# Patient Record
Sex: Male | Born: 1965 | Race: White | Hispanic: No | Marital: Married | Smoking: Never smoker
Health system: Southern US, Community
[De-identification: ages and names within clinical notes are randomized; demographics above are authoritative.]

## PROBLEM LIST (undated history)

## (undated) DIAGNOSIS — E119 Type 2 diabetes mellitus without complications: Secondary | ICD-10-CM

---

## 2000-04-26 ENCOUNTER — Emergency Department (HOSPITAL_COMMUNITY): Admission: EM | Admit: 2000-04-26 | Discharge: 2000-04-26 | Payer: Self-pay | Admitting: Emergency Medicine

## 2000-04-26 ENCOUNTER — Encounter: Payer: Self-pay | Admitting: Emergency Medicine

## 2016-09-09 DIAGNOSIS — J019 Acute sinusitis, unspecified: Secondary | ICD-10-CM | POA: Diagnosis not present

## 2016-09-09 DIAGNOSIS — M60077 Infective myositis, left toe(s): Secondary | ICD-10-CM | POA: Diagnosis not present

## 2016-10-08 ENCOUNTER — Inpatient Hospital Stay (HOSPITAL_BASED_OUTPATIENT_CLINIC_OR_DEPARTMENT_OTHER)
Admission: EM | Admit: 2016-10-08 | Discharge: 2016-10-10 | DRG: 624 | Disposition: A | Discharge status: Home / Self Care | Payer: 59 | Attending: Internal Medicine | Admitting: Internal Medicine

## 2016-10-08 ENCOUNTER — Encounter (HOSPITAL_BASED_OUTPATIENT_CLINIC_OR_DEPARTMENT_OTHER): Payer: Self-pay | Admitting: *Deleted

## 2016-10-08 ENCOUNTER — Emergency Department (HOSPITAL_BASED_OUTPATIENT_CLINIC_OR_DEPARTMENT_OTHER): Payer: 59

## 2016-10-08 ENCOUNTER — Encounter: Payer: Self-pay | Admitting: Family Medicine

## 2016-10-08 ENCOUNTER — Ambulatory Visit (INDEPENDENT_AMBULATORY_CARE_PROVIDER_SITE_OTHER): Payer: 59 | Admitting: Family Medicine

## 2016-10-08 VITALS — BP 164/88 | HR 106 | Temp 98.4°F | Resp 16 | Ht 71.0 in | Wt 224.0 lb

## 2016-10-08 DIAGNOSIS — L97519 Non-pressure chronic ulcer of other part of right foot with unspecified severity: Secondary | ICD-10-CM

## 2016-10-08 DIAGNOSIS — R030 Elevated blood-pressure reading, without diagnosis of hypertension: Secondary | ICD-10-CM | POA: Diagnosis present

## 2016-10-08 DIAGNOSIS — E1165 Type 2 diabetes mellitus with hyperglycemia: Secondary | ICD-10-CM | POA: Diagnosis not present

## 2016-10-08 DIAGNOSIS — S91121A Laceration with foreign body of right great toe without damage to nail, initial encounter: Secondary | ICD-10-CM

## 2016-10-08 DIAGNOSIS — Z6833 Body mass index (BMI) 33.0-33.9, adult: Secondary | ICD-10-CM

## 2016-10-08 DIAGNOSIS — L089 Local infection of the skin and subcutaneous tissue, unspecified: Secondary | ICD-10-CM

## 2016-10-08 DIAGNOSIS — L03031 Cellulitis of right toe: Secondary | ICD-10-CM | POA: Diagnosis not present

## 2016-10-08 DIAGNOSIS — E11621 Type 2 diabetes mellitus with foot ulcer: Principal | ICD-10-CM

## 2016-10-08 DIAGNOSIS — R739 Hyperglycemia, unspecified: Secondary | ICD-10-CM | POA: Diagnosis present

## 2016-10-08 DIAGNOSIS — E669 Obesity, unspecified: Secondary | ICD-10-CM | POA: Diagnosis present

## 2016-10-08 DIAGNOSIS — Z823 Family history of stroke: Secondary | ICD-10-CM

## 2016-10-08 DIAGNOSIS — T148XXA Other injury of unspecified body region, initial encounter: Secondary | ICD-10-CM

## 2016-10-08 DIAGNOSIS — S91132A Puncture wound without foreign body of left great toe without damage to nail, initial encounter: Secondary | ICD-10-CM | POA: Diagnosis not present

## 2016-10-08 DIAGNOSIS — E1140 Type 2 diabetes mellitus with diabetic neuropathy, unspecified: Secondary | ICD-10-CM | POA: Diagnosis present

## 2016-10-08 DIAGNOSIS — E66811 Obesity, class 1: Secondary | ICD-10-CM

## 2016-10-08 DIAGNOSIS — Z6831 Body mass index (BMI) 31.0-31.9, adult: Secondary | ICD-10-CM

## 2016-10-08 DIAGNOSIS — E876 Hypokalemia: Secondary | ICD-10-CM

## 2016-10-08 DIAGNOSIS — L97509 Non-pressure chronic ulcer of other part of unspecified foot with unspecified severity: Secondary | ICD-10-CM

## 2016-10-08 DIAGNOSIS — Z833 Family history of diabetes mellitus: Secondary | ICD-10-CM

## 2016-10-08 DIAGNOSIS — E6609 Other obesity due to excess calories: Secondary | ICD-10-CM

## 2016-10-08 HISTORY — DX: Type 2 diabetes mellitus without complications: E11.9

## 2016-10-08 LAB — BASIC METABOLIC PANEL
Anion gap: 11 (ref 5–15)
BUN: 9 mg/dL (ref 6–20)
CALCIUM: 9 mg/dL (ref 8.9–10.3)
CO2: 27 mmol/L (ref 22–32)
CREATININE: 0.63 mg/dL (ref 0.61–1.24)
Chloride: 97 mmol/L — ABNORMAL LOW (ref 101–111)
GFR calc Af Amer: >60 mL/min (ref >60)
Glucose, Bld: 342 mg/dL — ABNORMAL HIGH (ref 65–99)
POTASSIUM: 3.6 mmol/L (ref 3.5–5.1)
SODIUM: 135 mmol/L (ref 135–145)

## 2016-10-08 LAB — CBC WITH DIFFERENTIAL/PLATELET
Basophils Absolute: 0 10*3/uL (ref 0.0–0.1)
Basophils Relative: 0 %
EOS ABS: 0.1 10*3/uL (ref 0.0–0.7)
EOS PCT: 1 %
HCT: 40.2 % (ref 39.0–52.0)
Hemoglobin: 14.3 g/dL (ref 13.0–17.0)
LYMPHS ABS: 1.7 10*3/uL (ref 0.7–4.0)
LYMPHS PCT: 15 %
MCH: 29 pg (ref 26.0–34.0)
MCHC: 35.6 g/dL (ref 30.0–36.0)
MCV: 81.5 fL (ref 78.0–100.0)
MONO ABS: 0.8 10*3/uL (ref 0.1–1.0)
MONOS PCT: 7 %
Neutro Abs: 8.6 10*3/uL — ABNORMAL HIGH (ref 1.7–7.7)
Neutrophils Relative %: 77 %
PLATELETS: 249 10*3/uL (ref 150–400)
RBC: 4.93 MIL/uL (ref 4.22–5.81)
RDW: 12.7 % (ref 11.5–15.5)
WBC: 11.3 10*3/uL — AB (ref 4.0–10.5)

## 2016-10-08 MED ORDER — TETANUS-DIPHTH-ACELL PERTUSSIS 5-2.5-18.5 LF-MCG/0.5 IM SUSP
0.5000 mL | Freq: Once | INTRAMUSCULAR | Status: AC
  Administered 2016-10-08: 0.5 mL via INTRAMUSCULAR
  Filled 2016-10-08: qty 0.5

## 2016-10-08 MED ORDER — SODIUM CHLORIDE 0.9 % IV BOLUS (SEPSIS)
1000.0000 mL | Freq: Once | INTRAVENOUS | Status: AC
  Administered 2016-10-08: 1000 mL via INTRAVENOUS

## 2016-10-08 MED ORDER — CEFAZOLIN SODIUM-DEXTROSE 1-4 GM/50ML-% IV SOLN
1.0000 g | Freq: Once | INTRAVENOUS | Status: AC
  Administered 2016-10-08: 1 g via INTRAVENOUS
  Filled 2016-10-08: qty 50

## 2016-10-08 MED ORDER — SODIUM CHLORIDE 0.9 % IV SOLN
Freq: Once | INTRAVENOUS | Status: AC
  Administered 2016-10-09: 02:00:00 via INTRAVENOUS

## 2016-10-08 NOTE — ED Provider Notes (Signed)
[facility]Y [facility] Provider Note   CSN: [id number] Arrival date & time: 10/08/16  1449     History   Chief Complaint Chief Complaint  Patient presents with  . Foot Ulcer    HPI Scott Rog. is a 51 y.o. male.  HPI   Scott Navarro. is a 51 y.o. male, no diagnosed past medical history, presenting to the ED with wound to the right big toe. States he stepped on a piece of broken plastic about 2 months ago. He has been placing bandages and neosporin. This past week, the area began to get red and swollen. Denies any pain to the area. He denies fever/chills, numbness, tingling, weakness, subsequent injuries, or any other complaints.   History reviewed. No pertinent past medical history.  There are no active problems to display for this patient.   History reviewed. No pertinent surgical history.     Home Medications    Prior to Admission medications   Not on File    Family History History reviewed. No pertinent family history.  Social History Social History  Substance Use Topics  . Smoking status: Never Smoker  . Smokeless tobacco: Never Used  . Alcohol use No     Allergies   Patient has no known allergies.   Review of Systems Review of Systems  Constitutional: Negative for chills and fever.  Respiratory: Negative for shortness of breath.   Cardiovascular: Negative for chest pain.  Gastrointestinal: Negative for nausea and vomiting.  Musculoskeletal: Positive for joint swelling. Negative for arthralgias.  Skin: Positive for wound.  Neurological: Negative for dizziness, syncope, weakness, light-headedness, numbness and headaches.  All other systems reviewed and are negative.    Physical Exam Updated Vital Signs BP (!) 170/96   Pulse 99   Temp 98.5 F (36.9 C) (Oral)   Wt 101.6 kg (224 lb)   SpO2 100%   BMI 31.24 kg/m   Physical Exam  Constitutional: He appears well-developed and well-nourished. No distress.  HENT:  Head:  Normocephalic and atraumatic.  Eyes: Conjunctivae are normal.  Neck: Neck supple.  Cardiovascular: Normal rate, regular rhythm, normal heart sounds and intact distal pulses.   Pulmonary/Chest: Effort normal and breath sounds normal. No respiratory distress.  Abdominal: Soft. There is no tenderness. There is no guarding.  Musculoskeletal: He exhibits edema. He exhibits no tenderness.  Lymphadenopathy:    He has no cervical adenopathy.  Neurological: He is alert.  Patient endorses full sensation in the peripheral right lower extremity. Flexion and extension at the right great toe and ankle 5/5.  Skin: Skin is warm and dry. Capillary refill takes less than 2 seconds. He is not diaphoretic.  Large amount of swelling to the right great toe extending into the region of the distal first metatarsal. Overlying erythema to the same. Ulcer with purulent-appearing drainage to the medial plantar side of the right great toe. Secondary ulcer noted to the medial side of the right great toe distal to the MTP joint.  Psychiatric: He has a normal mood and affect. His behavior is normal.  Nursing note and vitals reviewed.                   ED Treatments / Results  Labs (all labs ordered are listed, but only abnormal results are displayed) Labs Reviewed  CBC WITH DIFFERENTIAL/PLATELET - Abnormal; Notable for the following:       Result Value   WBC 11.3 (*)    Neutro Abs 8.6 (*)  All other components within normal limits  BASIC METABOLIC PANEL - Abnormal; Notable for the following:    Chloride 97 (*)    Glucose, Bld 342 (*)    All other components within normal limits    EKG  EKG Interpretation None       Radiology Dg Toe Great Right  Result Date: 10/08/2016 CLINICAL DATA:  Infection. EXAM: RIGHT GREAT TOE COMPARISON:  None. FINDINGS: An 8 mm curvilinear density is noted superficially within the medial aspect of the right great toe. There is extensive soft tissue swelling with a  puncture wound along the plantar surface of the great toe medially. No acute or focal osseous abnormality is present. Degenerative changes are present at the first MTP joint. IMPRESSION: 1. Extensive soft tissue swelling and puncture wound along the plantar and medial aspect of the right great toe. 2. An 8 mm curvilinear density compatible with the residual foreign body. 3. No acute or focal osseous abnormality associated with the soft tissue swelling. Electronically Signed   By: Marin Robertshristopher  Mattern M.D.   On: 10/08/2016 15:24    Procedures Procedures (including critical care time)  Medications Ordered in ED Medications  sodium chloride 0.9 % bolus 1,000 mL (1,000 mLs Intravenous New Bag/Given 10/08/16 1902)    Followed by  0.9 %  sodium chloride infusion (not administered)  Tdap (BOOSTRIX) injection 0.5 mL (0.5 mLs Intramuscular Given 10/08/16 1735)  ceFAZolin (ANCEF) IVPB 1 g/50 mL premix (0 g Intravenous Stopped 10/08/16 1812)     Initial Impression / Assessment and Plan / ED Course  I have reviewed the triage vital signs and the nursing notes.  Pertinent labs & imaging results that were available during my care of the patient were reviewed by me and considered in my medical decision making (see chart for details).  Clinical Course as of Oct 08 2000  Tue Oct 08, 2016  1749 Spoke with Dr. Aundria Rudogers, orthopedic surgeon, who agrees with admission. Requests transfer to [facility] and admission via Medicine for IV antibiotic management. He agrees with Ancef for IV choice since the injury occurred when the patient was inside his home and he was barefoot at the time.  [SJ]  [id number] Spoke with Dr. Adela Glimpseoutova, hospitalist.  We discussed the patient's request to go POV to his admission destination. There are currently no admission beds available at [facility] and they're currently backed up with admissions. She requests that I speak to Dr. Aundria Rudogers to see if [facility] might be an option, as there are MedSurg  beds available there.  [SJ]  [id number] Spoke with Dr. Aundria Rudogers. States that he will definitely need to go to [facility] because Dr. Lajoyce Cornersuda will be in the OR at [facility] tomorrow and he will be the one to evaluate the patient and take him to the OR if needed.  [SJ]  1916 Spoke with Dr. Effie Shy[place]entz, [place] [facility], who states he will be expecting the patient.   [SJ]  1935 Spoke with Dr. Adela Glimpseoutova again to update her that the patient will be going to the ED at [facility] to await admission. She requests that once the patient arrives, whichever hospitalist is up next to take an admission be contacted and they can come admit the patient. Patient will also need to have an repeat IV placed.   [SJ]  1953 Spoke with Dr. Effie ShyWentz again. Updated him on hospitalist's preferences and to let him know patient will be on his way shortly.   [  SJ]    Clinical Course User Index [SJ] Pooja Camuso C, PA-C    Patient presents with wound infection to the right great toe. Admission for IV antibiotics and orthopedic evaluation. He is nontoxic appearing. He does not appear to be septic. Discussed admission and transfer with patient. Patient agrees to the treatment plan, except for transfer by ambulance. 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 He is adamant that he be allowed to secure this vehicle personally, but will then proceed to the admission destination POV.  I suspect the patient has undiagnosed hypertension and diabetes and the diabetes may have contributed to today's presentation. Hyperglycemia is noted, however, without anion gap and thus I doubt DKA.  Patient's IV was removed prior to transfer. Patient will be proceeding to the [facility] ED POV. He has strict instructions to pull over and call 911 should any change in his status occur.  Findings and plan of care discussed with Alvira Monday, MD.   Vitals:   10/08/16 1455 10/08/16 1456 10/08/16 1758  BP:  (!) 170/96 (!) 164/97    Pulse:  99 (!) 105  Resp:   18  Temp:  98.5 F (36.9 C)   TempSrc:  Oral   SpO2:  100% 96%  Weight: 101.6 kg (224 lb)     Patient's hypertension is noted. He has no symptoms of hypertensive urgency or emergency.    Final Clinical Impressions(s) / ED Diagnoses   Final diagnoses:  Wound infection  Laceration w FB of right great toe w/o damage to nail, init  Hyperglycemia    New Prescriptions New Prescriptions   No medications on file     Concepcion Living 10/08/16 2002    Detra Bores C, PA-C 10/08/16 Eyvonne Left, MD 10/12/16 2152

## 2016-10-08 NOTE — ED Provider Notes (Signed)
Patient sent from [facility] for evaluation of right great toe/foot pain and swelling. He reports stepping on a piece of plastic that punctured his foot approximately 2 months ago. Since then the healing process has waxed and waned. It has significantly worsened over the past week. He was seen by his primary doctor, then sent to the emergency department for IV antibiotics. [facility], the care was discussed with the orthopedic surgeon who wanted the patient admitted at [facility] so the patient could be evaluated and possibly undergo surgical debridement. As there are no beds in the hospital, he he was transferred to the ED. He has no further complaints.  Will discuss with the hospitalist.   Geoffery Lyonselo, Edwardine Deschepper, MD 10/09/16 0000

## 2016-10-08 NOTE — Discharge Instructions (Signed)
Please proceed directly to the [facility] Emergency Department. They should be expecting you.  If you have any issues prior to getting to the destination ED, please pull over and call 911.

## 2016-10-08 NOTE — Progress Notes (Signed)
Subjective:  By signing my name below, I, Stann Oresung-Kai Tsai, attest that this documentation has been prepared under the direction and in the presence of Meredith StaggersJeffrey Lawan Nanez, MD. Electronically Signed: Stann Oresung-Kai Tsai, Scribe. 10/08/2016 , 1:54 PM .  Patient was seen in Room 10 .   Patient ID: Scott HoppingWoodrow A Ruotolo Jr., male    DOB: [birth year], 51 y.o.   MRN: [id number] Chief Complaint  Patient presents with  . Toe Injury    stepped on plastic 2 months ago now increased redness and swelling to right great toe   HPI Scott HoppingWoodrow A Marte Jr. is a 51 y.o. male  Patient states he stepped on a small piece that was broken off of the base of a lamp when he walked around his house in the dark roughly 2 months ago. He notes initial puncture is located at bottom of right great toe and it's stayed irritated since. He informs the area bled right away. He has initially treated the area with neosporin and it improved back to normal toe color, except the puncture was still open. In the evenings, he's been soaking the area with peroxide and water.   However, he noticed some swelling with color change a week ago. He's had pus draining intermittently from the wound, depending how much he's been standing on his feet. He also states a crack appearing this morning with blood; he felt a pop when leaning over this morning to put shoes on. He denies knowledge of diabetes. He denies any chronic medical issues. He denies history of broken bone. He denies fever, chills, or hot & cold spells.   He works in [employer].   There are no active problems to display for this patient.  No past medical history on file. No past surgical history on file. Allergies not on file Prior to Admission medications   Not on File   Social History   Social History  . Marital status: Married    Spouse name: N/A  . Number of children: N/A  . Years of education: N/A   Occupational History  . Not on file.   Social History Main  Topics  . Smoking status: Not on file  . Smokeless tobacco: Not on file  . Alcohol use Not on file  . Drug use: Unknown  . Sexual activity: Not on file   Other Topics Concern  . Not on file   Social History Narrative  . No narrative on file   Review of Systems  Constitutional: Negative for chills, fatigue, fever and unexpected weight change.  Eyes: Negative for visual disturbance.  Respiratory: Negative for cough, chest tightness and shortness of breath.   Cardiovascular: Negative for chest pain, palpitations and leg swelling.  Gastrointestinal: Negative for abdominal pain, blood in stool, diarrhea, nausea and vomiting.  Musculoskeletal: Positive for joint swelling and myalgias.  Skin: Positive for color change and wound.  Neurological: Negative for dizziness, light-headedness and headaches.       Objective:   Physical Exam  Constitutional: He is oriented to person, place, and time. He appears well-developed and well-nourished. No distress.  HENT:  Head: Normocephalic and atraumatic.  Eyes: Pupils are equal, round, and reactive to light. EOM are normal.  Neck: Neck supple.  Cardiovascular: Normal rate.   Pulmonary/Chest: Effort normal. No respiratory distress.  Musculoskeletal: Normal range of motion.  Right foot: markedly swollen right great toe with an ulcer at the base of the toe that appears to be at least 5-276mm deep,  there is another opening laterally, unable to visualize base; there is some clear discharge with surrounding macerated skin; medial aspect of his great toe, there is another wound draining similar clear fluid and macerated skin; there's a small split dorsally on the toe; erythema and swelling extends to the distal first MTP; erythema extends to base of great toe which is approximately 2-3 times the size of left great toe; primary area of tenderness is medial base of his toe and area of open wound Left foot: has callous at the MTP and small ulceration between  first and second MTP's without surrounding edema or erythema  Neurological: He is alert and oriented to person, place, and time.  Skin: Skin is warm and dry.  Psychiatric: He has a normal mood and affect. His behavior is normal.  Nursing note and vitals reviewed.   Vitals:   10/08/16 1328  BP: (!) 158/98  Pulse: (!) 106  Resp: 16  Temp: 98.4 F (36.9 C)  TempSrc: Oral  SpO2: 97%  Weight: 224 lb (101.6 kg)  Height: 5\' 11"  (1.803 m)      Assessment & Plan:   Scott Dirosa. is a 51 y.o. male Toe infection  Ulcer of right foot, unspecified ulcer stage (HCC)  Cellulitis of great toe of right foot   Prior puncture wound with open wound for 2 months, now with increased swelling, plantar ulcer and medial ulceration/wound. Concerning for deep space infection, and based on timing could have osteomyelitis.   -recommended ER eval as likley will need imaging to determine degree of wound/infection and eval bone. Ortho/wound care may also need to be consulted.   -patient chose [facility] ER, but advised of possible need for admission and may then need transport to hospital.   -XR, bloodwork deferred as likely will have performed in ER.   No orders of the defined types were placed in this encounter.  Patient Instructions   Unfortunately based on the appearance of your toe today, I am concerned about a deep space infection or possible infection extending to the bone. I will call [facility] emergency room to let them know we are sending you their way. They should be able to do other imaging and evaluation of that wound at that time. As they are likely to do blood work and x-rays, I did not order those at our office. Depending on the plan at in the ER, I am happy to see you in follow-up if needed.    IF you received an x-ray today, you will receive an invoice from [facility]. Please contact [facility] at [phone] with questions or concerns  regarding your invoice.   IF you received labwork today, you will receive an invoice from [place]. Please contact [facility] at [phone] with questions or concerns regarding your invoice.   Our billing staff will not be able to assist you with questions regarding bills from these companies.  You will be contacted with the lab results as soon as they are available. The fastest way to get your results is to activate your My Chart account. Instructions are located on the last page of this paperwork. If you have not heard from [place] regarding the results in 2 weeks, please contact this office.       I personally performed the services described in this documentation, which was scribed in my presence. The recorded information has been reviewed and considered for accuracy and completeness, addended by me as needed, and  agree with information above.  Signed,   Meredith Staggers, MD Primary Care at [facility].  10/10/16 8:28 AM

## 2016-10-08 NOTE — Progress Notes (Signed)
Ortho Note  I have discussed this case with the ED staff at [facility].  We appreciate medicine service admission for initiation of iv abx.  Dr. Lajoyce Cornersuda will evaluate the patient in the AM and determine if there is any indication for surgical management.  Please make NPO at [facility] tonight.   Yolonda KidaJason Patrick Najee Manninen, MD Orthopedic Surgery

## 2016-10-08 NOTE — Patient Instructions (Addendum)
Unfortunately based on the appearance of your toe today, I am concerned about a deep space infection or possible infection extending to the bone. I will call [facility] emergency room to let them know we are sending you their way. They should be able to do other imaging and evaluation of that wound at that time. As they are likely to do blood work and x-rays, I did not order those at our office. Depending on the plan at in the ER, I am happy to see you in follow-up if needed.    IF you received an x-ray today, you will receive an invoice from [facility]. Please contact [facility] at [phone] with questions or concerns regarding your invoice.   IF you received labwork today, you will receive an invoice from [facility]. Please contact [facility] at [phone] with questions or concerns regarding your invoice.   Our billing staff will not be able to assist you with questions regarding bills from these companies.  You will be contacted with the lab results as soon as they are available. The fastest way to get your results is to activate your My Chart account. Instructions are located on the last page of this paperwork. If you have not heard from us regarding the results in 2 weeks, please contact this office.

## 2016-10-08 NOTE — ED Notes (Signed)
Line to affected site done, dated and timed.

## 2016-10-08 NOTE — ED Notes (Signed)
Report given to Brittney in the ED and she stated that there is a 5 hour wait.   Patient is leaving POV.

## 2016-10-08 NOTE — ED Notes (Signed)
Dr. Delo at the bedside 

## 2016-10-08 NOTE — ED Triage Notes (Signed)
Pt sent here from [facility] for eval foot wound x 1 month ago after stepping on plastic.

## 2016-10-09 ENCOUNTER — Inpatient Hospital Stay (HOSPITAL_COMMUNITY): Payer: 59 | Admitting: Anesthesiology

## 2016-10-09 ENCOUNTER — Encounter (HOSPITAL_COMMUNITY): Payer: Self-pay | Admitting: Internal Medicine

## 2016-10-09 ENCOUNTER — Inpatient Hospital Stay (HOSPITAL_COMMUNITY)
Admit: 2016-10-09 | Discharge: 2016-10-09 | Disposition: A | Discharge status: Home / Self Care | Payer: 59 | Attending: Internal Medicine | Admitting: Internal Medicine

## 2016-10-09 ENCOUNTER — Encounter (HOSPITAL_COMMUNITY)
Admission: EM | Disposition: A | Discharge status: Home / Self Care | Payer: Self-pay | Source: Home / Self Care | Attending: Internal Medicine

## 2016-10-09 DIAGNOSIS — Z823 Family history of stroke: Secondary | ICD-10-CM | POA: Diagnosis not present

## 2016-10-09 DIAGNOSIS — E876 Hypokalemia: Secondary | ICD-10-CM | POA: Diagnosis not present

## 2016-10-09 DIAGNOSIS — T148XXA Other injury of unspecified body region, initial encounter: Secondary | ICD-10-CM

## 2016-10-09 DIAGNOSIS — L97519 Non-pressure chronic ulcer of other part of right foot with unspecified severity: Secondary | ICD-10-CM | POA: Diagnosis present

## 2016-10-09 DIAGNOSIS — R030 Elevated blood-pressure reading, without diagnosis of hypertension: Secondary | ICD-10-CM | POA: Diagnosis not present

## 2016-10-09 DIAGNOSIS — E1142 Type 2 diabetes mellitus with diabetic polyneuropathy: Secondary | ICD-10-CM

## 2016-10-09 DIAGNOSIS — E1165 Type 2 diabetes mellitus with hyperglycemia: Secondary | ICD-10-CM | POA: Diagnosis present

## 2016-10-09 DIAGNOSIS — R739 Hyperglycemia, unspecified: Secondary | ICD-10-CM

## 2016-10-09 DIAGNOSIS — Z6831 Body mass index (BMI) 31.0-31.9, adult: Secondary | ICD-10-CM | POA: Diagnosis not present

## 2016-10-09 DIAGNOSIS — L97509 Non-pressure chronic ulcer of other part of unspecified foot with unspecified severity: Secondary | ICD-10-CM | POA: Diagnosis not present

## 2016-10-09 DIAGNOSIS — S91121A Laceration with foreign body of right great toe without damage to nail, initial encounter: Secondary | ICD-10-CM | POA: Diagnosis not present

## 2016-10-09 DIAGNOSIS — Z833 Family history of diabetes mellitus: Secondary | ICD-10-CM | POA: Diagnosis not present

## 2016-10-09 DIAGNOSIS — E6609 Other obesity due to excess calories: Secondary | ICD-10-CM | POA: Diagnosis not present

## 2016-10-09 DIAGNOSIS — M79609 Pain in unspecified limb: Secondary | ICD-10-CM

## 2016-10-09 DIAGNOSIS — E11621 Type 2 diabetes mellitus with foot ulcer: Secondary | ICD-10-CM | POA: Diagnosis not present

## 2016-10-09 DIAGNOSIS — L03031 Cellulitis of right toe: Secondary | ICD-10-CM | POA: Diagnosis not present

## 2016-10-09 DIAGNOSIS — L089 Local infection of the skin and subcutaneous tissue, unspecified: Secondary | ICD-10-CM | POA: Diagnosis present

## 2016-10-09 DIAGNOSIS — Z6833 Body mass index (BMI) 33.0-33.9, adult: Secondary | ICD-10-CM | POA: Diagnosis not present

## 2016-10-09 DIAGNOSIS — E669 Obesity, unspecified: Secondary | ICD-10-CM | POA: Diagnosis present

## 2016-10-09 DIAGNOSIS — E1140 Type 2 diabetes mellitus with diabetic neuropathy, unspecified: Secondary | ICD-10-CM | POA: Diagnosis present

## 2016-10-09 HISTORY — PX: AMPUTATION: SHX166

## 2016-10-09 LAB — CBG MONITORING, ED
GLUCOSE-CAPILLARY: 374 mg/dL — AB (ref 65–99)
Glucose-Capillary: 310 mg/dL — ABNORMAL HIGH (ref 65–99)
Glucose-Capillary: 219 mg/dL — ABNORMAL HIGH (ref 65–99)

## 2016-10-09 LAB — CBC
HCT: 36.6 % — ABNORMAL LOW (ref 39.0–52.0)
HEMOGLOBIN: 12.5 g/dL — AB (ref 13.0–17.0)
MCH: 28.3 pg (ref 26.0–34.0)
MCHC: 34.2 g/dL (ref 30.0–36.0)
MCV: 82.8 fL (ref 78.0–100.0)
PLATELETS: 212 10*3/uL (ref 150–400)
RBC: 4.42 MIL/uL (ref 4.22–5.81)
RDW: 12.7 % (ref 11.5–15.5)
WBC: 8 10*3/uL (ref 4.0–10.5)

## 2016-10-09 LAB — PREALBUMIN: PREALBUMIN: 17.8 mg/dL — AB (ref 18–38)

## 2016-10-09 LAB — PHOSPHORUS: Phosphorus: 3.3 mg/dL (ref 2.5–4.6)

## 2016-10-09 LAB — COMPREHENSIVE METABOLIC PANEL
ALK PHOS: 61 U/L (ref 38–126)
ALT: 16 U/L — ABNORMAL LOW (ref 17–63)
ANION GAP: 9 (ref 5–15)
AST: 14 U/L — ABNORMAL LOW (ref 15–41)
Albumin: 3.1 g/dL — ABNORMAL LOW (ref 3.5–5.0)
BILIRUBIN TOTAL: 0.4 mg/dL (ref 0.3–1.2)
BUN: 9 mg/dL (ref 6–20)
CALCIUM: 8.7 mg/dL — AB (ref 8.9–10.3)
CO2: 26 mmol/L (ref 22–32)
CREATININE: 0.58 mg/dL — AB (ref 0.61–1.24)
Chloride: 102 mmol/L (ref 101–111)
GFR calc non Af Amer: >60 mL/min (ref >60)
Glucose, Bld: 267 mg/dL — ABNORMAL HIGH (ref 65–99)
Potassium: 3.3 mmol/L — ABNORMAL LOW (ref 3.5–5.1)
Sodium: 137 mmol/L (ref 135–145)
TOTAL PROTEIN: 6.6 g/dL (ref 6.5–8.1)

## 2016-10-09 LAB — HIV ANTIBODY (ROUTINE TESTING W REFLEX): HIV Screen 4th Generation wRfx: NONREACTIVE

## 2016-10-09 LAB — URINALYSIS, ROUTINE W REFLEX MICROSCOPIC
Bacteria, UA: NONE SEEN
Bilirubin Urine: NEGATIVE
HGB URINE DIPSTICK: NEGATIVE
Ketones, ur: 20 mg/dL — AB
Leukocytes, UA: NEGATIVE
Nitrite: NEGATIVE
PH: 5 (ref 5.0–8.0)
Protein, ur: NEGATIVE mg/dL
RBC / HPF: NONE SEEN RBC/hpf (ref 0–5)
SPECIFIC GRAVITY, URINE: 1.036 — AB (ref 1.005–1.030)
Squamous Epithelial / LPF: NONE SEEN

## 2016-10-09 LAB — TSH: TSH: 1.924 u[IU]/mL (ref 0.350–4.500)

## 2016-10-09 LAB — GLUCOSE, CAPILLARY
GLUCOSE-CAPILLARY: 217 mg/dL — AB (ref 65–99)
GLUCOSE-CAPILLARY: 233 mg/dL — AB (ref 65–99)
GLUCOSE-CAPILLARY: 267 mg/dL — AB (ref 65–99)

## 2016-10-09 LAB — MAGNESIUM: MAGNESIUM: 2.1 mg/dL (ref 1.7–2.4)

## 2016-10-09 LAB — SEDIMENTATION RATE: SED RATE: 92 mm/h — AB (ref 0–16)

## 2016-10-09 LAB — C-REACTIVE PROTEIN: CRP: 2.7 mg/dL — AB (ref <1.0)

## 2016-10-09 LAB — HEMOGLOBIN A1C
Hgb A1c MFr Bld: 12.9 % — ABNORMAL HIGH (ref 4.8–5.6)
Mean Plasma Glucose: 323.53 mg/dL

## 2016-10-09 SURGERY — AMPUTATION, FOOT, RAY
Anesthesia: General | Site: Toe | Laterality: Right

## 2016-10-09 MED ORDER — ACETAMINOPHEN 325 MG PO TABS: 650.0000 mg | ORAL_TABLET | Freq: Four times a day (QID) | ORAL | Status: DC | PRN

## 2016-10-09 MED ORDER — MEPERIDINE HCL 25 MG/ML IJ SOLN: 6.2500 mg | INTRAMUSCULAR | Status: DC | PRN

## 2016-10-09 MED ORDER — METRONIDAZOLE IN NACL 5-0.79 MG/ML-% IV SOLN
500.0000 mg | Freq: Three times a day (TID) | INTRAVENOUS | Status: DC
  Administered 2016-10-09 – 2016-10-10 (×3): 500 mg via INTRAVENOUS
  Filled 2016-10-09 (×5): qty 100

## 2016-10-09 MED ORDER — BISACODYL 10 MG RE SUPP: 10.0000 mg | Freq: Every day | RECTAL | Status: DC | PRN

## 2016-10-09 MED ORDER — ONDANSETRON HCL 4 MG/2ML IJ SOLN: 4.0000 mg | Freq: Once | INTRAMUSCULAR | Status: DC | PRN

## 2016-10-09 MED ORDER — METHOCARBAMOL 1000 MG/10ML IJ SOLN: 500.0000 mg | Freq: Four times a day (QID) | INTRAVENOUS | Status: DC | PRN

## 2016-10-09 MED ORDER — SODIUM CHLORIDE 0.9 % IV SOLN: INTRAVENOUS | Status: DC

## 2016-10-09 MED ORDER — OXYCODONE HCL 5 MG PO TABS
5.0000 mg | ORAL_TABLET | ORAL | Status: DC | PRN
  Administered 2016-10-09: 10 mg via ORAL
  Filled 2016-10-09: qty 2

## 2016-10-09 MED ORDER — POVIDONE-IODINE 10 % EX SWAB: 2.0000 "application " | Freq: Once | CUTANEOUS | Status: DC

## 2016-10-09 MED ORDER — HYDROCODONE-ACETAMINOPHEN 5-325 MG PO TABS
1.0000 | ORAL_TABLET | ORAL | Status: DC | PRN
  Administered 2016-10-09: 1 via ORAL

## 2016-10-09 MED ORDER — ONDANSETRON HCL 4 MG PO TABS: 4.0000 mg | ORAL_TABLET | Freq: Four times a day (QID) | ORAL | Status: DC | PRN

## 2016-10-09 MED ORDER — LACTATED RINGERS IV SOLN
INTRAVENOUS | Status: DC | PRN
  Administered 2016-10-09: 11:00:00 via INTRAVENOUS

## 2016-10-09 MED ORDER — DEXTROSE 5 % IV SOLN
2.0000 g | INTRAVENOUS | Status: DC
  Administered 2016-10-10: 2 g via INTRAVENOUS
  Filled 2016-10-09 (×2): qty 2

## 2016-10-09 MED ORDER — MAGNESIUM CITRATE PO SOLN: 1.0000 | Freq: Once | ORAL | Status: DC | PRN

## 2016-10-09 MED ORDER — METRONIDAZOLE IN NACL 5-0.79 MG/ML-% IV SOLN
INTRAVENOUS | Status: AC
  Filled 2016-10-09: qty 100

## 2016-10-09 MED ORDER — METOCLOPRAMIDE HCL 5 MG/ML IJ SOLN: 5.0000 mg | Freq: Three times a day (TID) | INTRAMUSCULAR | Status: DC | PRN

## 2016-10-09 MED ORDER — LACTATED RINGERS IV SOLN
INTRAVENOUS | Status: DC
  Administered 2016-10-09: 12:00:00 via INTRAVENOUS

## 2016-10-09 MED ORDER — LIDOCAINE HCL (CARDIAC) 20 MG/ML IV SOLN
INTRAVENOUS | Status: DC | PRN
  Administered 2016-10-09: 80 mg via INTRAVENOUS

## 2016-10-09 MED ORDER — ONDANSETRON HCL 4 MG/2ML IJ SOLN: 4.0000 mg | Freq: Four times a day (QID) | INTRAMUSCULAR | Status: DC | PRN

## 2016-10-09 MED ORDER — CEFAZOLIN SODIUM-DEXTROSE 2-4 GM/100ML-% IV SOLN
2.0000 g | INTRAVENOUS | Status: AC
  Administered 2016-10-09: 2 g via INTRAVENOUS
  Filled 2016-10-09: qty 100

## 2016-10-09 MED ORDER — DOCUSATE SODIUM 100 MG PO CAPS
100.0000 mg | ORAL_CAPSULE | Freq: Two times a day (BID) | ORAL | Status: DC
  Administered 2016-10-09 – 2016-10-10 (×2): 100 mg via ORAL
  Filled 2016-10-09 (×2): qty 1

## 2016-10-09 MED ORDER — ACETAMINOPHEN 650 MG RE SUPP: 650.0000 mg | Freq: Four times a day (QID) | RECTAL | Status: DC | PRN

## 2016-10-09 MED ORDER — HYDROCODONE-ACETAMINOPHEN 5-325 MG PO TABS
ORAL_TABLET | ORAL | Status: AC
  Filled 2016-10-09: qty 1

## 2016-10-09 MED ORDER — ONDANSETRON HCL 4 MG/2ML IJ SOLN
INTRAMUSCULAR | Status: DC | PRN
  Administered 2016-10-09: 4 mg via INTRAVENOUS

## 2016-10-09 MED ORDER — INSULIN ASPART 100 UNIT/ML ~~LOC~~ SOLN
0.0000 [IU] | SUBCUTANEOUS | Status: DC
  Administered 2016-10-09: 5 [IU] via SUBCUTANEOUS
  Administered 2016-10-09: 9 [IU] via SUBCUTANEOUS
  Administered 2016-10-09: 3 [IU] via SUBCUTANEOUS
  Administered 2016-10-10 (×2): 2 [IU] via SUBCUTANEOUS
  Administered 2016-10-10 (×2): 3 [IU] via SUBCUTANEOUS
  Filled 2016-10-09 (×2): qty 1

## 2016-10-09 MED ORDER — METOCLOPRAMIDE HCL 5 MG PO TABS: 5.0000 mg | ORAL_TABLET | Freq: Three times a day (TID) | ORAL | Status: DC | PRN

## 2016-10-09 MED ORDER — HYDROMORPHONE HCL 1 MG/ML IJ SOLN: 1.0000 mg | INTRAMUSCULAR | Status: DC | PRN

## 2016-10-09 MED ORDER — DEXTROSE 5 % IV SOLN
2.0000 g | INTRAVENOUS | Status: DC
  Administered 2016-10-09: 2 g via INTRAVENOUS
  Filled 2016-10-09: qty 2

## 2016-10-09 MED ORDER — 0.9 % SODIUM CHLORIDE (POUR BTL) OPTIME
TOPICAL | Status: DC | PRN
  Administered 2016-10-09: 1000 mL

## 2016-10-09 MED ORDER — PROPOFOL 10 MG/ML IV BOLUS
INTRAVENOUS | Status: DC | PRN
  Administered 2016-10-09: 50 mg via INTRAVENOUS
  Administered 2016-10-09: 150 mg via INTRAVENOUS

## 2016-10-09 MED ORDER — MIDAZOLAM HCL 5 MG/5ML IJ SOLN
INTRAMUSCULAR | Status: DC | PRN
  Administered 2016-10-09: 2 mg via INTRAVENOUS

## 2016-10-09 MED ORDER — FENTANYL CITRATE (PF) 100 MCG/2ML IJ SOLN
INTRAMUSCULAR | Status: DC | PRN
  Administered 2016-10-09: 50 ug via INTRAVENOUS

## 2016-10-09 MED ORDER — DEXTROSE 5 % IV SOLN
INTRAVENOUS | Status: AC
  Filled 2016-10-09: qty 2

## 2016-10-09 MED ORDER — MIDAZOLAM HCL 2 MG/2ML IJ SOLN
INTRAMUSCULAR | Status: AC
  Filled 2016-10-09: qty 2

## 2016-10-09 MED ORDER — HYDROMORPHONE HCL 1 MG/ML IJ SOLN: 0.2500 mg | INTRAMUSCULAR | Status: DC | PRN

## 2016-10-09 MED ORDER — METRONIDAZOLE IN NACL 5-0.79 MG/ML-% IV SOLN
500.0000 mg | Freq: Three times a day (TID) | INTRAVENOUS | Status: DC
  Administered 2016-10-09 (×2): 500 mg via INTRAVENOUS
  Filled 2016-10-09 (×2): qty 100

## 2016-10-09 MED ORDER — METHOCARBAMOL 500 MG PO TABS: 500.0000 mg | ORAL_TABLET | Freq: Four times a day (QID) | ORAL | Status: DC | PRN

## 2016-10-09 MED ORDER — PROPOFOL 10 MG/ML IV BOLUS
INTRAVENOUS | Status: AC
  Filled 2016-10-09: qty 20

## 2016-10-09 MED ORDER — POLYETHYLENE GLYCOL 3350 17 G PO PACK: 17.0000 g | PACK | Freq: Every day | ORAL | Status: DC | PRN

## 2016-10-09 MED ORDER — CHLORHEXIDINE GLUCONATE 4 % EX LIQD
60.0000 mL | Freq: Once | CUTANEOUS | Status: DC
  Filled 2016-10-09: qty 60

## 2016-10-09 MED ORDER — SODIUM CHLORIDE 0.9 % IV SOLN: INTRAVENOUS | Status: AC

## 2016-10-09 SURGICAL SUPPLY — 27 items
BLADE SAW SGTL MED 73X18.5 STR (BLADE) IMPLANT
BLADE SURG 21 STRL SS (BLADE) ×2 IMPLANT
BNDG COHESIVE 4X5 TAN STRL (GAUZE/BANDAGES/DRESSINGS) ×2 IMPLANT
BNDG GAUZE ELAST 4 BULKY (GAUZE/BANDAGES/DRESSINGS) ×2 IMPLANT
COVER SURGICAL LIGHT HANDLE (MISCELLANEOUS) ×4 IMPLANT
DRAPE U-SHAPE 47X51 STRL (DRAPES) ×4 IMPLANT
DRSG ADAPTIC 3X8 NADH LF (GAUZE/BANDAGES/DRESSINGS) ×2 IMPLANT
DRSG PAD ABDOMINAL 8X10 ST (GAUZE/BANDAGES/DRESSINGS) ×4 IMPLANT
DURAPREP 26ML APPLICATOR (WOUND CARE) ×2 IMPLANT
ELECT REM PT RETURN 9FT ADLT (ELECTROSURGICAL) ×2
ELECTRODE REM PT RTRN 9FT ADLT (ELECTROSURGICAL) ×1 IMPLANT
GAUZE SPONGE 4X4 12PLY STRL (GAUZE/BANDAGES/DRESSINGS) ×2 IMPLANT
GLOVE BIOGEL PI IND STRL 9 (GLOVE) ×1 IMPLANT
GLOVE BIOGEL PI INDICATOR 9 (GLOVE) ×1
GLOVE SURG ORTHO 9.0 STRL STRW (GLOVE) ×2 IMPLANT
GOWN STRL REUS W/ TWL XL LVL3 (GOWN DISPOSABLE) ×2 IMPLANT
GOWN STRL REUS W/TWL XL LVL3 (GOWN DISPOSABLE) ×4
KIT BASIN OR (CUSTOM PROCEDURE TRAY) ×2 IMPLANT
KIT ROOM TURNOVER OR (KITS) ×2 IMPLANT
NS IRRIG 1000ML POUR BTL (IV SOLUTION) ×2 IMPLANT
PACK ORTHO EXTREMITY (CUSTOM PROCEDURE TRAY) ×2 IMPLANT
PAD ARMBOARD 7.5X6 YLW CONV (MISCELLANEOUS) ×4 IMPLANT
STOCKINETTE IMPERVIOUS LG (DRAPES) IMPLANT
SUT ETHILON 2 0 PSLX (SUTURE) ×2 IMPLANT
TOWEL OR 17X26 10 PK STRL BLUE (TOWEL DISPOSABLE) ×2 IMPLANT
TUBE CONNECTING 12X1/4 (SUCTIONS) ×2 IMPLANT
YANKAUER SUCT BULB TIP NO VENT (SUCTIONS) ×2 IMPLANT

## 2016-10-09 NOTE — ED Notes (Signed)
Pt back from vascular and being transported to OR

## 2016-10-09 NOTE — Progress Notes (Signed)
Inpatient Diabetes Program Recommendations  AACE/ADA: New Consensus Statement on Inpatient Glycemic Control (2015)  Target Ranges:  Prepandial:   less than 140 mg/dL      Peak postprandial:   less than 180 mg/dL (1-2 hours)      Critically ill patients:  140 - 180 mg/dL   Results for Nicolaou, Lowella DellWOODROW A JR. (MRN [id number]) as of 10/09/2016 09:30  Ref. Range 10/09/2016 01:46 10/09/2016 03:06 10/09/2016 07:34  Glucose-Capillary Latest Ref Range: 65 - 99 mg/dL 409374 (H) Novolog 9 units given 310 (H) 219 (H) Novolog 3 units given   Review of Glycemic Control  Diabetes history: New diagnosis Current orders for Inpatient glycemic control: Novolog Sensitve Correction 0-9 units Q4 hours  Inpatient Diabetes Program Recommendations:    A1c 12.9% on 10/09/16. Patient meets criteria for new Diabetes diagnosis. Will see patient this admission. Will order education and educational materials after he gets into an inpatient room.  Consider Lantus 10 units (0.1 units/kg) while inpatient.  Thanks,  Christena DeemShannon Larra Crunkleton RN, MSN, [facility] Inpatient Diabetes Coordinator Team Pager [phone] (8a-5p)

## 2016-10-09 NOTE — Anesthesia Postprocedure Evaluation (Signed)
Anesthesia Post Note  Patient: Scott HoppingWoodrow A Siller Jr.  Procedure(s) Performed: Procedure(s) (LRB): INCISION AND DRAINAGE RIGHT GREAT TOE (Right)     Patient location during evaluation: PACU Anesthesia Type: General Level of consciousness: awake and alert Pain management: pain level controlled Vital Signs Assessment: post-procedure vital signs reviewed and stable Respiratory status: spontaneous breathing, nonlabored ventilation, respiratory function stable and patient connected to nasal cannula oxygen Cardiovascular status: blood pressure returned to baseline and stable Postop Assessment: no signs of nausea or vomiting Anesthetic complications: no    Last Vitals:  Vitals:   10/09/16 1530 10/09/16 1630  BP: (!) 145/76 (!) 148/73  Pulse: 89 91  Resp:    Temp:      Last Pain:  Vitals:   10/09/16 1630  TempSrc:   PainSc: 0-No pain                 Jannae Fagerstrom DAVID

## 2016-10-09 NOTE — Op Note (Signed)
10/08/2016 - 10/09/2016  1:19 PM  PATIENT:  Scott HoppingWoodrow A Dexter Jr.    PRE-OPERATIVE DIAGNOSIS:  INFECTED RIGHT GREAT TOE  POST-OPERATIVE DIAGNOSIS:  Same  PROCEDURE:  INCISION AND DRAINAGE RIGHT GREAT TOE  Deep tissue samples sent for cultures. Local tissue rearrangement for wound closure 1 x 5 cm.  SURGEON:  Nadara MustardMarcus V Willmer Fellers, MD  PHYSICIAN ASSISTANT:None ANESTHESIA:   General  PREOPERATIVE INDICATIONS:  Scott HoppingWoodrow A Fant Jr. is a  51 y.o. male with a diagnosis of INFECTED RIGHT GREAT TOE who failed conservative measures and elected for surgical management.    The risks benefits and alternatives were discussed with the patient preoperatively including but not limited to the risks of infection, bleeding, nerve injury, cardiopulmonary complications, the need for revision surgery, among others, and the patient was willing to proceed.  OPERATIVE IMPLANTS: None  OPERATIVE FINDINGS: Abscess infection extended down to the proximal phalanx of the right great toe  OPERATIVE PROCEDURE: Patient was brought to the operating room and underwent a general anesthetic. After adequate levels anesthesia obtained patient's right lower extremity was prepped using DuraPrep draped into a sterile field a timeout was called. Elliptical incision was made that extended along the medial border of the great toe at the MTP joint extended distally and curved to incorporate the ulcerative tissue and the ulcerative tissue was resected in 1 block of tissue approximately 1 x 5 cm. This extended down to bone. There is no destructive bony changes there was only a small drop of purulent drainage. The wound was irrigated with normal saline there was good petechial bleeding. Local tissue rearrangement was used to loosely close the 1 x 5 cm defect. A sterile compressive dressing was applied patient was extubated taken the PACU in stable condition.  Infection extended down to bone patient most likely will require IV antibiotics at  discharge.

## 2016-10-09 NOTE — Consult Note (Signed)
ORTHOPAEDIC CONSULTATION  REQUESTING PHYSICIAN: Vassie Loll, MD  Chief Complaint: Cellulitis pain and swelling right great toe.  HPI: Scott Navarro. is a 51 y.o. male who presents with cellulitis ulceration retained foreign body right great toe. Patient states that he stepped on a piece of plastic 1-2 months ago. Patient states that he is progressively had increased pain and drainage with cellulitis and swelling.  Patient states that his father had diabetes but he has not been diagnosed with diabetes. Patient's sugars are running in the 300s during admission.  Patient does not smoke or drink. He is not taking any medication.  History reviewed. No pertinent past medical history. History reviewed. No pertinent surgical history. Social History   Social History  . Marital status: Married    Spouse name: N/A  . Number of children: N/A  . Years of education: N/A   Social History Main Topics  . Smoking status: Never Smoker  . Smokeless tobacco: Never Used  . Alcohol use No  . Drug use: No  . Sexual activity: Not Asked   Other Topics Concern  . None   Social History Narrative  . None   Family History  Problem Relation Age of Onset  . Diabetes Father   . Lung cancer Other   . Stroke Other   . CAD Neg Hx   . Hypertension Neg Hx    - negative except otherwise stated in the family history section No Known Allergies Prior to Admission medications   Not on File   Dg Toe Great Right  Result Date: 10/08/2016 CLINICAL DATA:  Infection. EXAM: RIGHT GREAT TOE COMPARISON:  None. FINDINGS: An 8 mm curvilinear density is noted superficially within the medial aspect of the right great toe. There is extensive soft tissue swelling with a puncture wound along the plantar surface of the great toe medially. No acute or focal osseous abnormality is present. Degenerative changes are present at the first MTP joint. IMPRESSION: 1. Extensive soft tissue swelling and puncture wound along  the plantar and medial aspect of the right great toe. 2. An 8 mm curvilinear density compatible with the residual foreign body. 3. No acute or focal osseous abnormality associated with the soft tissue swelling. Electronically Signed   By: Marin Roberts M.D.   On: 10/08/2016 15:24   - pertinent xrays, CT, MRI studies were reviewed and independently interpreted  Positive ROS: All other systems have been reviewed and were otherwise negative with the exception of those mentioned in the HPI and as above.  Physical Exam: General: Alert, no acute distress Psychiatric: Patient is competent for consent with normal mood and affect Lymphatic: No axillary or cervical lymphadenopathy Cardiovascular: No pedal edema Respiratory: No cyanosis, no use of accessory musculature GI: No organomegaly, abdomen is soft and non-tender  Skin: Examination patient has sausage digit swelling of the right great toe. There is an ulcer plantarly there is blistering and drainage consistent with abscess and most likely osteomyelitis.   Neurologic: Patient does not have protective sensation bilateral lower extremities.   MUSCULOSKELETAL:  Examination patient has a good dorsalis pedis pulse and there is cellulitis up to the MTP joint there is no red streaking. Review of the radiographs shows swelling there is no destructive bony changes possible retained plastic foreign body.  Assessment: Assessment: Diabetic insensate neuropathy with cellulitis abscess ulceration right great toe with possible retained foreign body possible osteomyelitis.  Plan: Plan: We will plan for surgical intervention this morning with debridement and  attempted salvage of the toe. Discussed the risk of toe amputation.  Thank you for the consult and the opportunity to see [name]  [name], MD [facility] [phone] 7:21 AM

## 2016-10-09 NOTE — Progress Notes (Signed)
Inpatient Diabetes Program Recommendations  AACE/ADA: New Consensus Statement on Inpatient Glycemic Control (2015)  Target Ranges:  Prepandial:   less than 140 mg/dL      Peak postprandial:   less than 180 mg/dL (1-2 hours)      Critically ill patients:  140 - 180 mg/dL   Lab Results  Component Value Date   GLUCAP 217 (H) 10/09/2016   HGBA1C 12.9 (H) 10/09/2016   Attempted to catch patient before procedure and speak to him about A1c level and new DM diagnosis. Patient just left for procedure. Will see patient tomorrow 8/9.  Thanks,  Christena DeemShannon Davina Howlett RN, MSN, [facility] Inpatient Diabetes Coordinator Team Pager [phone] (8a-5p)

## 2016-10-09 NOTE — H&P (Signed)
Scott Navarro. ZOX:[id number] DOB: Jun 03, 1965 DOA: 10/08/2016     PCP: Dr Chilton Si  Outpatient Specialists: none Patient coming from:   home Lives  With family    Chief Complaint: Right toe infection  HPI: Scott Navarro. is a 51 y.o. male with no significant medical history       Presented with pain in his right great toe she stepped on plastic 2 months ago while walking barefoot in his apartment started to have swelling of his right great toe. He attempted to use Neosporin but continued to have open puncture has been trying to wash (etc. water did not seem to improve. He started to have pus draining from the wound intermittently. He had had any fevers or chills he presented to [facility] urgent care today and was sent to [facility] emergency department Denies weight loss, frequent urination, no fatigue, no chest pain.  His blood pressure have been slightly elevated he have had some stress recently after his father passed away.   Regarding pertinent Chronic problems: Patient denies history of diabetes or hypertension.   IN ER:  Temp (24hrs), Avg:98.5 F (36.9 C), Min:98.4 F (36.9 C), Max:98.5 F (36.9 C)      on arrival  ED Triage Vitals  Enc Vitals Group     BP 10/08/16 1456 (!) 170/96     Pulse Rate 10/08/16 1456 99     Resp 10/08/16 1758 18     Temp 10/08/16 1456 98.5 F (36.9 C)     Temp Source 10/08/16 1456 Oral     SpO2 10/08/16 1456 100 %     Weight 10/08/16 1455 224 lb (101.6 kg)     Height --      Head Circumference --      Peak Flow --      Pain Score 10/08/16 1454 5     Pain Loc --      Pain Edu? --      Excl. in GC? --    97% HR 105 BP 152/93 WBC 11.3 Hg 14.3 PLt 249 Na 135 K 3.6 Cr 0.63 Glucose 342 Plain imaging showing residual foreign body no osteo  Following Medications were ordered in ER: Medications  sodium chloride 0.9 % bolus 1,000 mL (0 mLs Intravenous Stopped 10/08/16 2025)    Followed by  0.9 %  sodium chloride infusion (not  administered)  Tdap (BOOSTRIX) injection 0.5 mL (0.5 mLs Intramuscular Given 10/08/16 1735)  ceFAZolin (ANCEF) IVPB 1 g/50 mL premix (0 g Intravenous Stopped 10/08/16 1812)     ER provider discussed case with:  Dr. Aundria Rud orthopedics who recommends start on Ancef and admission for IV antibiotics admitted to [facility] for possible or debridement by Dr. Lajoyce Corners tomorrow morning  Hospitalist was called for admission for great toe wound infection  Review of Systems:    Pertinent positives include:toe pain and swelling  Constitutional:  No weight loss, night sweats, Fevers, chills, fatigue, weight loss  HEENT:  No headaches, Difficulty swallowing,Tooth/dental problems,Sore throat,  No sneezing, itching, ear ache, nasal congestion, post nasal drip,  Cardio-vascular:  No chest pain, Orthopnea, PND, anasarca, dizziness, palpitations.no Bilateral lower extremity swelling  GI:  No heartburn, indigestion, abdominal pain, nausea, vomiting, diarrhea, change in bowel habits, loss of appetite, melena, blood in stool, hematemesis Resp:  no shortness of breath at rest. No dyspnea on exertion, No excess mucus, no productive cough, No non-productive cough, No coughing up of blood.No change in color of mucus.No  wheezing. Skin:  no rash or lesions. No jaundice GU:  no dysuria, change in color of urine, no urgency or frequency. No straining to urinate.  No flank pain.  Musculoskeletal:  No joint pain or no joint swelling. No decreased range of motion. No back pain.  Psych:  No change in mood or affect. No depression or anxiety. No memory loss.  Neuro: no localizing neurological complaints, no tingling, no weakness, no double vision, no gait abnormality, no slurred speech, no confusion  As per HPI otherwise 10 point review of systems negative.   Past Medical History: History reviewed. No pertinent past medical history. History reviewed. No pertinent surgical history.   Social History:  Ambulatory  independently     reports that he has never smoked. He has never used smokeless tobacco. He reports that he does not drink alcohol or use drugs.  Allergies:  No Known Allergies     Family History:   Family History  Problem Relation Age of Onset  . Diabetes Father   . Lung cancer Other   . Stroke Other   . CAD Neg Hx   . Hypertension Neg Hx     Medications: Prior to Admission medications   Not on File    Physical Exam: Patient Vitals for the past 24 hrs:  BP Temp Temp src Pulse Resp SpO2 Weight  10/09/16 0015 (!) 152/93 - - (!) 105 - 97 % -  10/08/16 2002 (!) 154/88 - - (!) 107 18 99 % -  10/08/16 1758 (!) 164/97 - - (!) 105 18 96 % -  10/08/16 1456 (!) 170/96 98.5 F (36.9 C) Oral 99 - 100 % -  10/08/16 1455 - - - - - - 101.6 kg (224 lb)    1. General:  in No Acute distress 2. Psychological: Alert and   Oriented 3. Head/ENT:    Dry Mucous Membranes                          Head Non traumatic, neck supple                          Normal   Dentition 4. SKIN:  decreased Skin turgor,  Skin clean Dry and intact no rash Ulceration of right great toe 5. Heart: Regular rate and rhythm no  Murmur, Rub or gallop 6. Lungs: Clear to auscultation bilaterally, no wheezes or crackles   7. Abdomen: Soft,  non-tender, Non distended 8. Lower extremities: no clubbing, cyanosis, or edema 9. Neurologically Grossly intact, moving all 4 extremities equally   10. MSK: Normal range of motion   body mass index is 31.24 kg/m.  Labs on Admission:   Labs on Admission: I have personally reviewed following labs and imaging studies  CBC:  Recent Labs Lab 10/08/16 1725  WBC 11.3*  NEUTROABS 8.6*  HGB 14.3  HCT 40.2  MCV 81.5  PLT 249   Basic Metabolic Panel:  Recent Labs Lab 10/08/16 1725  NA 135  K 3.6  CL 97*  CO2 27  GLUCOSE 342*  BUN 9  CREATININE 0.63  CALCIUM 9.0   GFR: Estimated Creatinine Clearance: 132.6 mL/min (by C-G formula based on SCr of 0.63  mg/dL). Liver Function Tests: No results for input(s): AST, ALT, ALKPHOS, BILITOT, PROT, ALBUMIN in the last 168 hours. No results for input(s): LIPASE, AMYLASE in the last 168 hours. No results for input(s): AMMONIA in the  last 168 hours. Coagulation Profile: No results for input(s): INR, PROTIME in the last 168 hours. Cardiac Enzymes: No results for input(s): CKTOTAL, CKMB, CKMBINDEX, TROPONINI in the last 168 hours. BNP (last 3 results) No results for input(s): PROBNP in the last 8760 hours. HbA1C: No results for input(s): HGBA1C in the last 72 hours. CBG: No results for input(s): GLUCAP in the last 168 hours. Lipid Profile: No results for input(s): CHOL, HDL, LDLCALC, TRIG, CHOLHDL, LDLDIRECT in the last 72 hours. Thyroid Function Tests: No results for input(s): TSH, T4TOTAL, FREET4, T3FREE, THYROIDAB in the last 72 hours. Anemia Panel: No results for input(s): VITAMINB12, FOLATE, FERRITIN, TIBC, IRON, RETICCTPCT in the last 72 hours. Urine analysis: No results found for: COLORURINE, APPEARANCEUR, LABSPEC, PHURINE, GLUCOSEU, HGBUR, BILIRUBINUR, KETONESUR, PROTEINUR, UROBILINOGEN, NITRITE, LEUKOCYTESUR Sepsis Labs: @LABRCNTIP (procalcitonin:4,lacticidven:4) )No results found for this or any previous visit (from the past 240 hour(s)).     UA   ordered  No results found for: HGBA1C  Estimated Creatinine Clearance: 132.6 mL/min (by C-G formula based on SCr of 0.63 mg/dL).  BNP (last 3 results) No results for input(s): PROBNP in the last 8760 hours.   ECG REPORT  Independently reviewed ordered  Filed Weights   10/08/16 1455  Weight: 101.6 kg (224 lb)     Cultures: No results found for: SDES, SPECREQUEST, CULT, REPTSTATUS   Radiological Exams on Admission: Dg Toe Great Right  Result Date: 10/08/2016 CLINICAL DATA:  Infection. EXAM: RIGHT GREAT TOE COMPARISON:  None. FINDINGS: An 8 mm curvilinear density is noted superficially within the medial aspect of the right  great toe. There is extensive soft tissue swelling with a puncture wound along the plantar surface of the great toe medially. No acute or focal osseous abnormality is present. Degenerative changes are present at the first MTP joint. IMPRESSION: 1. Extensive soft tissue swelling and puncture wound along the plantar and medial aspect of the right great toe. 2. An 8 mm curvilinear density compatible with the residual foreign body. 3. No acute or focal osseous abnormality associated with the soft tissue swelling. Electronically Signed   By: Marin Robertshristopher  Mattern M.D.   On: 10/08/2016 15:24    Chart has been reviewed  Assessment/Plan   51 y.o. male with no significant medical history significant   Admitted for great toe wound infection  Present on Admission: . Wound infection - Dr. Lajoyce CornersUDA see patient in the morning. For now continue IV antibiotics ceftriaxone and IV metronidazole, keep nothing by mouth post midnight order ABI, MRSA screen plain films showing no osteomyelitis but for a body noted appreciated orthopedics consult  . Hyperglycemia - obtain TSH hemoglobin A1c monitor blood sugars and cover sliding scale patient probably has undiagnosed diabetes order nutritional consult and diabetes care coordinator elevated blood pressure - if continues to be elevated and evidence of proteinuria would initiate ACE inhibitor  Other plan as per orders.  DVT prophylaxis:  SCD    Code Status:  FULL CODE  as per patient  Family Communication:   Family not at  Bedside   Disposition Plan:     To home once workup is complete and patient is stable                                          Diabetes coordinator  Nutrition  consulted  Consults called : Orthopedics  Admission status:    inpatient       Level of care          medical floor           I have spent a total of 56 min on this admission   Ajeet Casasola 10/09/2016, 1:12 AM    [facility]  Pager [phone]    after 2 AM please page floor coverage PA If 7AM-7PM, please contact the day team taking care of the patient  [email or web address]  Password [email or web address]

## 2016-10-09 NOTE — Progress Notes (Signed)
Patient seen and examined. Admitted after midnight secondary to right great toe ulcer and infection. Started on IV antibiotics and with plans for surgical debridement by orthopedic surgery. Patient is hemodynamically stable. Please refer to H&P written by Dr. Adela Glimpseoutova for further info/details on admission.  Scott Navarro, [place] Jordahl [phone]

## 2016-10-09 NOTE — Clinical Social Work Note (Signed)
Clinical Social Work Assessment  Patient Details  Name: Scott HoppingWoodrow A Sloniker Jr. MRN: [id number] Date of Birth: Aug 24, 1965  Date of referral:  10/09/16               Reason for consult:  Medication Concerns                Permission sought to share information with:  Family Supports Permission granted to share information::  Yes, Verbal Permission Granted  Name::        Agency::     Relationship::     Contact Information:     Housing/Transportation Living arrangements for the past 2 months:  Single Family Home (with wife and adult son. ) Source of Information:  Patient Patient Interpreter Needed:  None Criminal Activity/Legal Involvement Pertinent to Current Situation/Hospitalization:  No - Comment as needed Significant Relationships:  Spouse, Adult Children Lives with:    Do you feel safe going back to the place where you live?  Yes Need for family participation in patient care:  Yes (Comment)  Care giving concerns:  CSW spoke with pt at bedside. During this time pt was very pleasant and answerer questions that were asked. 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 Pt is looking forward to getting better and getting back to work as soon as possible.    Social Worker assessment / plan:  CSW spoke with pt at bedside. Pt informed CSW that pt was going to have surgery to remove something from pt's toe. CSW was also informed that after this procedure pt is open to rehab if needed so that pt can return to work ASAP. Pt also informed CSW that pt lived with wife and their son lives with them. Pt and wife have lived together for 27 years, but their adult son recently moved back in to help care for pt and wife. Pt informed CSW that pt's son helps with paying bills since pt's wife is currently unemployed and has medical needs as well. Pt is involved with a [employer] and mentioned that they do have a little support from them.   Employment status:   [employer] Insurance information:  Managed Medicare PT Recommendations:  Not assessed at this time Information / Referral to community resources:  Skilled Nursing Facility  Patient/Family's Response to care:  Pt is understanding and agreeable to plan of care at this time. Pt is looking to return to work once medically cleared to.   Patient/Family's Understanding of and Emotional Response to Diagnosis, Current Treatment, and Prognosis:  No further questions or concerns have been presented at this time.   Emotional Assessment Appearance:  Appears stated age Attitude/Demeanor/Rapport:  Unable to Assess Affect (typically observed):  Accepting, Adaptable, Hopeful, Calm Orientation:  Oriented to Self, Oriented to Place, Oriented to  Time, Oriented to Situation Alcohol / Substance use:  Never Used Psych involvement (Current and /or in the community):  No (Comment)  Discharge Needs  Concerns to be addressed:  No discharge needs identified Readmission within the last 30 days:  No Current discharge risk:  None Barriers to Discharge:  No Barriers Identified   Robb MatarKierra S Annamaria Salah, LCSWA 10/09/2016, 9:18 AM

## 2016-10-09 NOTE — Progress Notes (Signed)
VASCULAR LAB PRELIMINARY  ARTERIAL  ABI completed:    RIGHT    LEFT    PRESSURE WAVEFORM  PRESSURE WAVEFORM  BRACHIAL 185 Triphasic BRACHIAL 167  Triphasic  DP 224 Triphasic DP 227 Triphasic  PT 225 Tri[hasic PT 223  Triphasic  GREAT TOE 243 NA GREAT TOE 253 NA    RIGHT LEFT  ABI / TBI 1.22 / 1.31 1.21 / 1.37   ABIs and Doppler waveforms indicate normal arterial flow bilaterally at rest. Bilateral TBIs are normal  Muriel Hannold, RVS 10/09/2016, 11:42 AM

## 2016-10-09 NOTE — Progress Notes (Signed)
Orthopedic Tech Progress Note Patient Details:  Lia HoppingWoodrow A Crossett Jr. 02-Feb-1966 [id number]  Ortho Devices Type of Ortho Device: Postop shoe/boot Ortho Device/Splint Location: RLE Ortho Device/Splint Interventions: Ordered, Application   Jennye MoccasinHughes, Christmas Faraci Craig 10/09/2016, 6:14 PM

## 2016-10-09 NOTE — Transfer of Care (Signed)
Immediate Anesthesia Transfer of Care Note  Patient: Scott HoppingWoodrow A Lindo Jr.  Procedure(s) Performed: Procedure(s): INCISION AND DRAINAGE RIGHT GREAT TOE (Right)  Patient Location: PACU  Anesthesia Type:General  Level of Consciousness: awake, alert , oriented and patient cooperative  Airway & Oxygen Therapy: Patient Spontanous Breathing and Patient connected to nasal cannula oxygen  Post-op Assessment: Report given to RN and Post -op Vital signs reviewed and stable  Post vital signs: Reviewed and stable  Last Vitals:  Vitals:   10/09/16 1151 10/09/16 1328  BP: 136/74   Pulse: 90   Resp: 16   Temp: 36.9 C (P) 36.7 C    Last Pain:  Vitals:   10/09/16 1328  TempSrc:   PainSc: (P) 0-No pain         Complications: No apparent anesthesia complications

## 2016-10-09 NOTE — ED Notes (Signed)
CBG 374 at this time.  Per Dr. Adela Glimpseoutova to give patient sliding scale at this time.

## 2016-10-09 NOTE — Consult Note (Signed)
WOC consult requested for right foot prior to ortho service involvement.  Dr Lajoyce Cornersuda has performed a consult and notes indicate; "We will plan for surgical intervention this morning with debridement and attempted salvage of the toe." Please refer to ortho team for further questions regarding plan of care.  Please re-consult if further assistance is needed.  Thank-you,  Cammie Mcgeeawn Ludella Pranger MSN, RN, CWOCN, EchoWCN-AP, CNS [phone]

## 2016-10-09 NOTE — Anesthesia Preprocedure Evaluation (Signed)
Anesthesia Evaluation  Patient identified by MRN, date of birth, ID band Patient awake    Reviewed: Allergy & Precautions, NPO status , Patient's Chart, lab work & pertinent test results  Airway Mallampati: I  TM Distance: >3 FB Neck ROM: Full    Dental   Pulmonary    Pulmonary exam normal        Cardiovascular Normal cardiovascular exam     Neuro/Psych    GI/Hepatic   Endo/Other  diabetes, Type 2  Renal/GU      Musculoskeletal   Abdominal   Peds  Hematology   Anesthesia Other Findings   Reproductive/Obstetrics                             Anesthesia Physical Anesthesia Plan  ASA: II  Anesthesia Plan: General   Post-op Pain Management:    Induction: Intravenous  PONV Risk Score and Plan: 2 and Ondansetron and Dexamethasone  Airway Management Planned: LMA  Additional Equipment:   Intra-op Plan:   Post-operative Plan: Extubation in OR  Informed Consent: I have reviewed the patients History and Physical, chart, labs and discussed the procedure including the risks, benefits and alternatives for the proposed anesthesia with the patient or authorized representative who has indicated his/her understanding and acceptance.     Plan Discussed with: CRNA and Surgeon  Anesthesia Plan Comments:         Anesthesia Quick Evaluation

## 2016-10-09 NOTE — ED Notes (Signed)
Per Dr. Adela Glimpseoutova coverage given at this time for CBG.  Educated on s/s of hypoglycemia.  Encouraged to call for assistance as needed.

## 2016-10-09 NOTE — ED Notes (Signed)
Admitting at the bedside.  

## 2016-10-10 ENCOUNTER — Encounter (HOSPITAL_COMMUNITY): Payer: Self-pay | Admitting: Orthopedic Surgery

## 2016-10-10 DIAGNOSIS — E6609 Other obesity due to excess calories: Secondary | ICD-10-CM

## 2016-10-10 DIAGNOSIS — T148XXA Other injury of unspecified body region, initial encounter: Secondary | ICD-10-CM

## 2016-10-10 DIAGNOSIS — E876 Hypokalemia: Secondary | ICD-10-CM

## 2016-10-10 DIAGNOSIS — L089 Local infection of the skin and subcutaneous tissue, unspecified: Secondary | ICD-10-CM

## 2016-10-10 DIAGNOSIS — E11621 Type 2 diabetes mellitus with foot ulcer: Principal | ICD-10-CM

## 2016-10-10 DIAGNOSIS — Z6833 Body mass index (BMI) 33.0-33.9, adult: Secondary | ICD-10-CM

## 2016-10-10 DIAGNOSIS — L97509 Non-pressure chronic ulcer of other part of unspecified foot with unspecified severity: Secondary | ICD-10-CM

## 2016-10-10 LAB — GLUCOSE, CAPILLARY
GLUCOSE-CAPILLARY: 192 mg/dL — AB (ref 65–99)
GLUCOSE-CAPILLARY: 196 mg/dL — AB (ref 65–99)
GLUCOSE-CAPILLARY: 214 mg/dL — AB (ref 65–99)
Glucose-Capillary: 242 mg/dL — ABNORMAL HIGH (ref 65–99)

## 2016-10-10 MED ORDER — METFORMIN HCL 500 MG PO TABS: 500.0000 mg | ORAL_TABLET | Freq: Two times a day (BID) | ORAL | 2 refills | Status: DC

## 2016-10-10 MED ORDER — OXYCODONE HCL 5 MG PO TABS: 5.0000 mg | ORAL_TABLET | Freq: Four times a day (QID) | ORAL | 0 refills | Status: DC | PRN

## 2016-10-10 MED ORDER — INSULIN PEN NEEDLE 31G X 5 MM MISC: 1 refills | Status: DC

## 2016-10-10 MED ORDER — INSULIN DETEMIR 100 UNIT/ML FLEXPEN: 15.0000 [IU] | PEN_INJECTOR | Freq: Every day | SUBCUTANEOUS | 11 refills | Status: DC

## 2016-10-10 MED ORDER — LIVING WELL WITH DIABETES BOOK
Freq: Once | Status: AC
  Administered 2016-10-10: 16:00:00
  Filled 2016-10-10: qty 1

## 2016-10-10 MED ORDER — INSULIN STARTER KIT- PEN NEEDLES (ENGLISH)
1.0000 | Freq: Once | Status: AC
  Administered 2016-10-10: 1
  Filled 2016-10-10: qty 1

## 2016-10-10 MED ORDER — METHOCARBAMOL 500 MG PO TABS: 500.0000 mg | ORAL_TABLET | Freq: Three times a day (TID) | ORAL | 0 refills | Status: DC | PRN

## 2016-10-10 MED ORDER — BLOOD GLUCOSE MONITOR KIT: PACK | 0 refills | Status: DC

## 2016-10-10 MED ORDER — AMOXICILLIN-POT CLAVULANATE 875-125 MG PO TABS: 1.0000 | ORAL_TABLET | Freq: Two times a day (BID) | ORAL | 0 refills | Status: DC

## 2016-10-10 MED ORDER — POLYETHYLENE GLYCOL 3350 17 G PO PACK: 17.0000 g | PACK | Freq: Every day | ORAL | 0 refills | Status: DC | PRN

## 2016-10-10 MED ORDER — OXYCODONE HCL 5 MG PO TABS: 5.0000 mg | ORAL_TABLET | ORAL | Status: DC | PRN

## 2016-10-10 MED ORDER — PNEUMOCOCCAL VAC POLYVALENT 25 MCG/0.5ML IJ INJ
0.5000 mL | INJECTION | INTRAMUSCULAR | Status: AC
  Administered 2016-10-10: 0.5 mL via INTRAMUSCULAR
  Filled 2016-10-10: qty 0.5

## 2016-10-10 NOTE — Progress Notes (Signed)
Patient ID: Scott HoppingWoodrow A Hosier Jr., male   DOB: Dec 08, 1965, 51 y.o.   MRN: [id number] Postoperative day 1 status post irrigation and debridement of the right great toe. Physical therapy progressive ambulation nonweightbearing on the right.   Would recommend discharge on 4 weeks of oral antibiotics. The wound did extend down to the bone.  i'll follow-up in the office in 1 week.

## 2016-10-10 NOTE — Progress Notes (Signed)
Orthopedic Tech Progress Note Patient Details:  Scott HoppingWoodrow A Radebaugh Jr. Nov 11, 1965 [id number]  Ortho Devices Type of Ortho Device: Crutches Ortho Device/Splint Location: RLE Ortho Device/Splint Interventions: Application   Scott Navarro 10/10/2016, 12:03 PM

## 2016-10-10 NOTE — Progress Notes (Signed)
Pt ready for d/c home per MD. Charge RN assisted primary RN with pt's discharge. Discharge instructions and prescriptions reviewed with pt and wife, all questions answered. Diabetes education was emphasized with pt and family, and teach back was used to gauge comprehension.   MaldenHudson, Latricia HeftKorie G

## 2016-10-10 NOTE — Progress Notes (Signed)
Transitions of Care - Pain Medication Review for Discharge to Home  Patient Scott Navarro is a 51 YO M2 hospitalized on 10/08/16, s/p debridement of the R great toe with newly diagnosed T2DM.    Assessment/Recommendation:  Upon questioning, patient states he is not in too much pain and would rate it as about a 1/10. Says he has not needed any oxycodone today and that when he is at home, he typically does not use anything more than an Advil. Has been requesting tylenol today for pain management. Pain appears to be well controlled with little opioid use.   In addition to the current prescription for oxycodone 5-10 mg po q6h prn for severe pain, consider the following:   1. Scheduled Acetaminophen 1000 mg po q8h for 5-7 days for continuous pain management, then taper to PRN.   ---------------------------------------------------------------------------------------------------------------------------  PTA, patient is on not on any opioid pain medications, confirmed by [place] CSRS check on 10/10/16.  Pertinent co-morbid conditions include T2DM which was diagnosed during this admission. No prior PMH.   CrCl=132.6 ml/min (using Cockcroft-Gault equation).  No PTA medications.    Today, patient reports 1/10 for R great toe pain.   Current in-patient opioids include break-through therapy with hydrocodone-acetaminophen 5-325 mg 1-2 tabs po q4h prn moderate pain, which the patient has received once since admitted. Also has received oxycodone 5-10 mg po q3h prn moderate or severe breakthrough pain, which he has received one 10 mg dose. Also has orders for hydromorphone 1 mg IV q2h prn severe unresolved breakthrough pain, which he has not yet needed.  Total amount of opioids received while on the floor equivalent to 20 mg morphine.   Non-opioid pain management includes acetaminophen 650 mg po q6h prn mild pain, which the patient has not been receiving.    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

## 2016-10-10 NOTE — Discharge Summary (Signed)
Physician Discharge Summary  Scott Navarro. SNK:[id number] DOB: 04-24-1965 DOA: 10/08/2016  PCP: Wendie Agreste, MD  Admit date: 10/08/2016 Discharge date: 10/10/2016  Time spent: 35 minutes  Recommendations for Outpatient Follow-up:  1. Please follow CBG and A1C closely; adjust hypoglycemic regimen as needed  2. Reassess BP and start antihypertensive regimen (ACE or ARB) if needed  3. Needs close follow up by Dr. Sharol Given for further care of his foot ulcer 4. Repeat BMET to follow electrolytes and renal function   Discharge Diagnoses:    Type 2 diabetes mellitus with foot ulcer, without [place]-term current use of insulin (HCC)   Wound infection   Elevated blood pressure reading   Laceration w FB of right great toe w/o damage to nail, init    Hypokalemia   Class 1 obesity due to excess calories with body mass index (BMI) of 31.24 in adult   Discharge Condition: stable and improved. Discharge home with instructions to follow up with PCP and orthopedic surgeon after discharge.   Diet recommendation: heart healthy diet and low carbohydrates   Filed Weights   10/08/16 1455 10/09/16 1212  Weight: 101.6 kg (224 lb) 101.6 kg (224 lb)    History of present illness:  51 y.o. male with no significant medical history prior to admission; who presented to ED secondary to right foot pain (especially right great toe). Patient reported having puncture wound in his foot approx 2 months ago; wound never heal and in fact got worse. Presented now with worsening in pain and purulent drainage. Patient denies fever, chills, nausea, vomiting, increase urination or any other complaints. Was seen by PCP and sent to ED for further evaluation and management.    Hospital Course:  1-diabetic foot ulcer -patient went to OR by Dr. Sharol Given for deep I & D -per Dr. Sharol Given he clean and debrided down to the bone -recommended antibiotics for 4 weeks and no weight bearing -will use PRN oxycodone and tylenol for pain  control  -case discussed with ID (Dr. Tommy Medal) and patient discharged on Augmentin  -cultures were taken while in the OR, but results pending   2-new diagnosis of DM type 2: with diabetic foot ulcer -A1C 12.1 -with average CBG in 240 for this A1C -will start treatment with metformin 569m BID and levemir 15 units QHS -needs close follow up with PCP for further adjustments on his hypoglycemic regimen  -patient instructed to check CBG three times a day and to maintain adequate hydration   3-elevated BP -even could be associated with pain from foot ulcer; most likely had component of HTN -no prior hx of HTN -recommended to follow heart healthy diet -will need reevaluation again of his BP as an outpatient and if still elevated will recommend initiation of antihypertensive regimen.  4-obesity -Body mass index is 31.24 kg/m. -low calorie diet and exercise discussed with patient    5-hypokalemia -repleted and within normal limits at discharge   Procedures:  Status post irrigation and debridement of the right great toe 10/09/16  Consultations:  Orthopedic surgery   ID (Dr. VTommy Medalcurbside)  Discharge Exam: Vitals:   10/10/16 0500 10/10/16 1500  BP: 129/78 (!) 155/81  Pulse: 86 97  Resp: 20 18  Temp: 98.2 F (36.8 C) 98.2 F (36.8 C)  SpO2: 97% 98%    General: afebrile, no CP, no SOB. Reports foot pain is well controlled. Cardiovascular: S1 and S2, no rubs, no gallops  Respiratory: CTA bilaterally Abd: soft, NT, ND,  positive BS Extremities: right foot with clean dressings in place, no cyanosis, good capillary fill; trace edema. Left leg w/o acute abnormalities.  Discharge Instructions   Discharge Instructions    Discharge instructions    Complete by:  As directed    Arrange follow up with PCP in 10 days Please follow up with Dr. Sharol Given in 1 week No weight bearing on your right foot Follow a low carbohydrates diet and keep yourself well hydrated Take medications as  prescribed   Increase activity slowly    Complete by:  As directed    Non weight bearing    Complete by:  As directed    Laterality:  right   Extremity:  Lower     Current Discharge Medication List    START taking these medications   Details  amoxicillin-clavulanate (AUGMENTIN) 875-125 MG tablet Take 1 tablet by mouth 2 (two) times daily. Qty: 62 tablet, Refills: 0    blood glucose meter kit and supplies KIT Dispense based on patient and insurance preference. Use up to four times daily as directed. (FOR ICD-9 250.00, 250.01). Use to check blood sugar three times a day. Qty: 1 each, Refills: 0    Insulin Detemir (LEVEMIR) 100 UNIT/ML Pen Inject 15 Units into the skin daily at 10 pm. Qty: 15 mL, Refills: 11    Insulin Pen Needle 31G X 5 MM MISC Use to inject insulin every night as instructed Qty: 100 each, Refills: 1    metFORMIN (GLUCOPHAGE) 500 MG tablet Take 1 tablet (500 mg total) by mouth 2 (two) times daily with a meal. Qty: 60 tablet, Refills: 2    methocarbamol (ROBAXIN) 500 MG tablet Take 1 tablet (500 mg total) by mouth every 8 (eight) hours as needed for muscle spasms. Qty: 25 tablet, Refills: 0    oxyCODONE (OXY IR/ROXICODONE) 5 MG immediate release tablet Take 1-2 tablets (5-10 mg total) by mouth every 6 (six) hours as needed for severe pain. Qty: 15 tablet, Refills: 0    polyethylene glycol (MIRALAX / GLYCOLAX) packet Take 17 g by mouth daily as needed for mild constipation. Qty: 28 each, Refills: 0       No Known Allergies Follow-up Information    Tool Follow up.   Contact information: [place] [address] [place], NP Follow up in 1 week(s).   Specialty:  Orthopedic Surgery Contact information: [place] [address] [phone]        Wendie Agreste, MD. Schedule an appointment as soon as possible for a visit in 10 day(s).   Specialties:   Family Medicine, Sports Medicine Contact information: [address] [place] [address] [phone]           The results of significant diagnostics from this hospitalization (including imaging, microbiology, ancillary and laboratory) are listed below for reference.    Significant Diagnostic Studies: Dg Toe Great Right  Result Date: 10/08/2016 CLINICAL DATA:  Infection. EXAM: RIGHT GREAT TOE COMPARISON:  None. FINDINGS: An 8 mm curvilinear density is noted superficially within the medial aspect of the right great toe. There is extensive soft tissue swelling with a puncture wound along the plantar surface of the great toe medially. No acute or focal osseous abnormality is present. Degenerative changes are present at the first MTP joint. IMPRESSION: 1. Extensive soft tissue swelling and puncture wound along the plantar and medial aspect of the right great  toe. 2. An 8 mm curvilinear density compatible with the residual foreign body. 3. No acute or focal osseous abnormality associated with the soft tissue swelling. Electronically Signed   By: San Morelle M.D.   On: 10/08/2016 15:24    Microbiology: Recent Results (from the past 240 hour(s))  Aerobic/Anaerobic Culture (surgical/deep wound)     Status: None (Preliminary result)   Collection Time: 10/09/16  1:05 PM  Result Value Ref Range Status   Specimen Description TISSUE  Final   Special Requests RIGHT GREAT TOE  Final   Gram Stain   Final    FEW WBC PRESENT, PREDOMINANTLY PMN ABUNDANT GRAM POSITIVE COCCI IN PAIRS MODERATE GRAM NEGATIVE RODS FEW GRAM POSITIVE RODS    Culture CULTURE REINCUBATED FOR BETTER GROWTH  Final   Report Status PENDING  Incomplete     Labs: Basic Metabolic Panel:  Recent Labs Lab 10/08/16 1725 10/09/16 0427  NA 135 137  K 3.6 3.3*  CL 97* 102  CO2 27 26  GLUCOSE 342* 267*  BUN 9 9  CREATININE 0.63 0.58*  CALCIUM 9.0 8.7*  MG  --  2.1  PHOS  --  3.3   Liver Function  Tests:  Recent Labs Lab 10/09/16 0427  AST 14*  ALT 16*  ALKPHOS 61  BILITOT 0.4  PROT 6.6  ALBUMIN 3.1*   CBC:  Recent Labs Lab 10/08/16 1725 10/09/16 0427  WBC 11.3* 8.0  NEUTROABS 8.6*  --   HGB 14.3 12.5*  HCT 40.2 36.6*  MCV 81.5 82.8  PLT 249 212   CBG:  Recent Labs Lab 10/09/16 2027 10/10/16 0021 10/10/16 0405 10/10/16 1024 10/10/16 1155  GLUCAP 267* 214* 192* 196* 242*    Signed:  Barton Dubois MD.  [facility] 10/10/2016, 3:53 PM

## 2016-10-10 NOTE — Evaluation (Signed)
Physical Therapy Evaluation & Discharge Patient Details Name: Scott Navarro A Bonano Jr. MRN: [id number] DOB: 23-Aug-1965 Today's Date: 10/10/2016   History of Present Illness  Patient is a 51 y/o male admitted with infected R great toe now s/p I&D and with newly diagnosed diabetes.   Clinical Impression  Patient presents with decreased mobility due to NWB status and difficulty using regular RW due to R wrist pain and concern for falls.  Able to ambulate with knee walker safely in hallway and negotiated stairs with crutch and rail with minA for safety.  Feel safe for d/c home with wife assist and no follow up needs at this time.  Gave pt handout on where to obtain knee walker.  Of note pt c/o diaphoresis, RN made aware due to recently got insulin, planned to recheck CBG.    Follow Up Recommendations No PT follow up    Equipment Recommendations  Crutches;Other (comment) (pt to obtain knee walker)    Recommendations for Other Services       Precautions / Restrictions Precautions Required Braces or Orthoses: Other Brace/Splint Other Brace/Splint: post op shoe Restrictions Weight Bearing Restrictions: Yes RLE Weight Bearing: Non weight bearing      Mobility  Bed Mobility Overal bed mobility: Modified Independent                Transfers Overall transfer level: Needs assistance   Transfers: Sit to/from Stand Sit to Stand: Supervision         General transfer comment: cues for technique to RW then to knee walker  Ambulation/Gait Ambulation/Gait assistance: Min guard Ambulation Distance (Feet): 150 Feet (x 2) Assistive device: Rolling walker (2 wheeled) (knee walker) Gait Pattern/deviations: Step-to pattern     General Gait Details: 5' with RW then unable to maintain NWB due to R wrist pain with IV site, returned to bed and used knee walker as noted above; assist for safety, cues for controlled propulsion and steering, demonstrated brakes and discussed transitions    Stairs Stairs: Yes Stairs assistance: Min assist Stair Management: One rail Left;Step to pattern;Forwards;With crutches Number of Stairs: 2 General stair comments: wife in room and educated how to assist, also discussed bringing walker up the steps or down depending on if ascending or descending  Wheelchair Mobility    Modified Rankin (Stroke Patients Only)       Balance Overall balance assessment: Needs assistance   Sitting balance-Leahy Scale: Normal     Standing balance support: Bilateral upper extremity supported Standing balance-Leahy Scale: Poor Standing balance comment: UE support for balance if NWB, with knee walker able to stand with fair balance                             Pertinent Vitals/Pain Pain Assessment: 0-10 Pain Score: 3  Pain Location: R foot Pain Descriptors / Indicators: Discomfort Pain Intervention(s): Monitored during session    Home Living Family/patient expects to be discharged to:: Private residence Living Arrangements: Spouse/significant other Available Help at Discharge: Family Type of Home: House Home Access: Stairs to enter Entrance Stairs-Rails: Left Entrance Stairs-Number of Steps: 4 Home Layout: Two level;Able to live on main level with bedroom/bathroom Home Equipment: None      Prior Function Level of Independence: Independent               Hand Dominance        Extremity/Trunk Assessment   Upper Extremity Assessment Upper Extremity Assessment: Overall WFL for  tasks assessed    Lower Extremity Assessment Lower Extremity Assessment: Overall WFL for tasks assessed       Communication   Communication: No difficulties  Cognition Arousal/Alertness: Awake/alert Behavior During Therapy: WFL for tasks assessed/performed Overall Cognitive Status: Within Functional Limits for tasks assessed                                        General Comments General comments (skin integrity,  edema, etc.): educated in fall prevention, safety with walker; handout given on how to obtain knee walker for home; also ordered crutches for home for stair negotiation.    Exercises     Assessment/Plan    PT Assessment Patent does not need any further PT services  PT Problem List         PT Treatment Interventions      PT Goals (Current goals can be found in the Care Plan section)  Acute Rehab PT Goals Patient Stated Goal: To go home PT Goal Formulation: All assessment and education complete, DC therapy    Frequency     Barriers to discharge        Co-evaluation               AM-PAC PT "6 Clicks" Daily Activity  Outcome Measure Difficulty turning over in bed (including adjusting bedclothes, sheets and blankets)?: None Difficulty moving from lying on back to sitting on the side of the bed? : None Difficulty sitting down on and standing up from a chair with arms (e.g., wheelchair, bedside commode, etc,.)?: Total Help needed moving to and from a bed to chair (including a wheelchair)?: A Little Help needed walking in hospital room?: A Little Help needed climbing 3-5 steps with a railing? : A Little 6 Click Score: 18    End of Session Equipment Utilized During Treatment: Gait belt Activity Tolerance: Patient tolerated treatment well Patient left: in bed;with call bell/phone within reach;with family/visitor present   PT Visit Diagnosis: Difficulty in walking, not elsewhere classified (R26.2)    Time: 1610-9604 PT Time Calculation (min) (ACUTE ONLY): 30 min   Charges:   PT Evaluation $PT Eval Moderate Complexity: 1 Mod PT Treatments $Gait Training: 8-22 mins   PT G Codes:   PT G-Codes **NOT FOR INPATIENT CLASS** Functional Assessment Tool Used: AM-PAC 6 Clicks Basic Mobility Functional Limitation: Mobility: Walking and moving around Mobility: Walking and Moving Around Current Status (V4098): At least 40 percent but less than 60 percent impaired, limited or  restricted Mobility: Walking and Moving Around Goal Status [phone]): At least 40 percent but less than 60 percent impaired, limited or restricted Mobility: Walking and Moving Around Discharge Status [phone]): At least 40 percent but less than 60 percent impaired, limited or restricted    [place], [place] [phone] 10/10/2016   Elray Mcgregor 10/10/2016, 12:22 PM

## 2016-10-10 NOTE — Progress Notes (Signed)
Inpatient Diabetes Program Recommendations  AACE/ADA: New Consensus Statement on Inpatient Glycemic Control (2015)  Target Ranges:  Prepandial:   less than 140 mg/dL      Peak postprandial:   less than 180 mg/dL (1-2 hours)      Critically ill patients:  140 - 180 mg/dL   Spoke with patient about new diabetes diagnosis.  Discussed A1C results (12.9%) and explained what an A1C is and informed patient that his current A1C indicates an average glucose of 240 mg/dl over the past 2-3 months. Discussed basic pathophysiology of DM Type 2, basic home care, importance of checking CBGs and maintaining good CBG control to prevent [place]-term and short-term complications. Reviewed glucose and A1C goals and explained that patient will need to continue to  Reviewed signs and symptoms of hyperglycemia and hypoglycemia along with treatment for both. Discussed impact of nutrition, exercise, stress, sickness, and medications on diabetes control. Patient is interested in outpatient education referral. Reviewed Living Well with diabetes booklet and encouraged patient to read through entire book. Asked patient to check his glucose at least 2-4 times per day (before meals and at bedtime) and to keep a log book of glucose readings and insulin taken. Explained how the doctor he follows up with can use the log book to continue to make insulin adjustments if needed. Reviewed how to use insulin pen. Patient verbalized understanding of information discussed and he states that he has no further questions at this time related to diabetes.  RNs to provide ongoing basic DM education at bedside with this patient and engage patient to actively check blood glucose and administer insulin injections.   Thanks, Christena DeemShannon Yekaterina Escutia RN, MSN, [facility] Inpatient Diabetes Coordinator Team Pager [phone] (8a-5p)

## 2016-10-14 LAB — AEROBIC/ANAEROBIC CULTURE (SURGICAL/DEEP WOUND)

## 2016-10-14 LAB — AEROBIC/ANAEROBIC CULTURE W GRAM STAIN (SURGICAL/DEEP WOUND)

## 2016-10-15 ENCOUNTER — Telehealth (INDEPENDENT_AMBULATORY_CARE_PROVIDER_SITE_OTHER): Payer: Self-pay

## 2016-10-15 ENCOUNTER — Other Ambulatory Visit (INDEPENDENT_AMBULATORY_CARE_PROVIDER_SITE_OTHER): Payer: Self-pay

## 2016-10-15 NOTE — Telephone Encounter (Signed)
Pt asked if you can send his letter for work to [employer] please

## 2016-10-15 NOTE — Telephone Encounter (Signed)
I called pt and he is taking Augmentin. You reviewed his labs and wanted to make sure he was taking abx sensative to infection. He also has an appt Friday. FYI

## 2016-10-15 NOTE — Telephone Encounter (Signed)
Sent to pt. S/p I&D right great toe. Advised to be NWTB with knee scooter and activity as tolerated at work. Pt has an appt on Friday for follow up.

## 2016-10-18 ENCOUNTER — Encounter (INDEPENDENT_AMBULATORY_CARE_PROVIDER_SITE_OTHER): Payer: Self-pay | Admitting: Orthopedic Surgery

## 2016-10-18 ENCOUNTER — Ambulatory Visit (INDEPENDENT_AMBULATORY_CARE_PROVIDER_SITE_OTHER): Payer: 59 | Admitting: Orthopedic Surgery

## 2016-10-18 VITALS — Ht 71.0 in | Wt 224.0 lb

## 2016-10-18 DIAGNOSIS — S91121A Laceration with foreign body of right great toe without damage to nail, initial encounter: Secondary | ICD-10-CM

## 2016-10-18 DIAGNOSIS — E11621 Type 2 diabetes mellitus with foot ulcer: Secondary | ICD-10-CM

## 2016-10-18 DIAGNOSIS — L97509 Non-pressure chronic ulcer of other part of unspecified foot with unspecified severity: Secondary | ICD-10-CM

## 2016-10-18 DIAGNOSIS — T148XXA Other injury of unspecified body region, initial encounter: Secondary | ICD-10-CM

## 2016-10-18 DIAGNOSIS — L089 Local infection of the skin and subcutaneous tissue, unspecified: Secondary | ICD-10-CM

## 2016-10-18 NOTE — Progress Notes (Signed)
   Post-Op Visit Note   Patient: Scott Navarro.           Date of Birth: 24-Apr-1965           MRN: [id number] Visit Date: 10/18/2016 PCP: Therisa Doyne, MD  Chief Complaint:  Chief Complaint  Patient presents with  . Right Great Toe - Routine Post Op    HPI:  The patient is a 51 year old gentleman who presents 1 week status post irrigation and debridement of his right great toe he had an injury where he was retained in foot. Has been taking Augmentin. Is using a knee scooter for nonweightbearing    Ortho Exam The incision is well proximal him sutures there is some serous fluid expressed from the center of the incision overall is healing well. There is no surrounding erythema no odor no cellulitis  Visit Diagnoses:  1. Laceration w FB of right great toe w/o damage to nail, init   2. Wound infection   3. Type 2 diabetes mellitus with foot ulcer, without [place]-term current use of insulin (HCC)     Plan: The wound cultures report was reviewed, is susceptible to Augmentin we'll continue this. Follow-up in office in 1 week for suture removal. He can daily Dial soap cleansing apply dry dressings continue to minimize weightbearing.  Follow-Up Instructions: Return in about 1 week (around 10/25/2016).   Imaging: No results found.  Orders:  No orders of the defined types were placed in this encounter.  No orders of the defined types were placed in this encounter.    PMFS History: Patient Active Problem List   Diagnosis Date Noted  . Type 2 diabetes mellitus with foot ulcer, without [place]-term current use of insulin (HCC)   . Hypokalemia   . Class 1 obesity due to excess calories with body mass index (BMI) of 33.0 to 33.9 in adult   . Wound infection 10/09/2016  . Hyperglycemia 10/09/2016  . Elevated blood pressure reading 10/09/2016  . Diabetic polyneuropathy associated with type 2 diabetes mellitus (HCC)   . Laceration w FB of right great toe w/o damage to nail, init     Past Medical History:  Diagnosis Date  . Type II diabetes mellitus (HCC) dx'd 10/08/2016    Family History  Problem Relation Age of Onset  . Diabetes Father   . Lung cancer Other   . Stroke Other   . CAD Neg Hx   . Hypertension Neg Hx     Past Surgical History:  Procedure Laterality Date  . 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 Not on file.   Social History Main Topics  . Smoking status: Never Smoker  . Smokeless tobacco: Never Used  . Alcohol use No  . Drug use: No  . Sexual activity: Not Currently

## 2016-10-25 ENCOUNTER — Ambulatory Visit (INDEPENDENT_AMBULATORY_CARE_PROVIDER_SITE_OTHER): Payer: 59 | Admitting: Family

## 2016-10-25 DIAGNOSIS — L97509 Non-pressure chronic ulcer of other part of unspecified foot with unspecified severity: Secondary | ICD-10-CM

## 2016-10-25 DIAGNOSIS — S91121A Laceration with foreign body of right great toe without damage to nail, initial encounter: Secondary | ICD-10-CM

## 2016-10-25 DIAGNOSIS — E11621 Type 2 diabetes mellitus with foot ulcer: Secondary | ICD-10-CM

## 2016-10-25 NOTE — Progress Notes (Signed)
   Post-Op Visit Note   Patient: Scott Navarro.           Date of Birth: 02-08-1966           MRN: [id number] Visit Date: 10/25/2016 PCP: Shade Flood, MD  Chief Complaint: No chief complaint on file.   HPI:  The patient is a 51 year old gentleman who presents to 2 weeks status post laceration to his right great toe which was subsequently irrigated and debrided. Sutures remain in place he's been using a kneeling scooter for nonweightbearing. States he has no longer had drainage on his dressing. Continues taking Augmentin, has 2 more weeks left of a month course    Ortho Exam Incision well approximated healing well there is no gaping drainage no erythema no sign of infection  Visit Diagnoses:  1. Laceration w FB of right great toe w/o damage to nail, init   2. Type 2 diabetes mellitus with foot ulcer, without [place]-term current use of insulin (HCC)     Plan: Sutures harvested today without incident May advance weightbearing in postop shoe continue with a dry dressing and daily cleansing until healed  Follow-Up Instructions: Return in about 2 weeks (around 11/08/2016).   Imaging: No results found.  Orders:  No orders of the defined types were placed in this encounter.  No orders of the defined types were placed in this encounter.    PMFS History: Patient Active Problem List   Diagnosis Date Noted  . Type 2 diabetes mellitus with foot ulcer, without [place]-term current use of insulin (HCC)   . Hypokalemia   . Class 1 obesity due to excess calories with body mass index (BMI) of 33.0 to 33.9 in adult   . Wound infection 10/09/2016  . Hyperglycemia 10/09/2016  . Elevated blood pressure reading 10/09/2016  . Diabetic polyneuropathy associated with type 2 diabetes mellitus (HCC)   . Laceration w FB of right great toe w/o damage to nail, init    Past Medical History:  Diagnosis Date  . Type II diabetes mellitus (HCC) dx'd 10/08/2016    Family History  Problem Relation  Age of Onset  . Diabetes Father   . Lung cancer Other   . Stroke Other   . CAD Neg Hx   . Hypertension Neg Hx     Past Surgical History:  Procedure Laterality Date  . 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 Not on file.   Social History Main Topics  . Smoking status: Never Smoker  . Smokeless tobacco: Never Used  . Alcohol use No  . Drug use: No  . Sexual activity: Not Currently

## 2016-11-08 ENCOUNTER — Ambulatory Visit (INDEPENDENT_AMBULATORY_CARE_PROVIDER_SITE_OTHER): Payer: 59 | Admitting: Family

## 2016-11-08 ENCOUNTER — Encounter (INDEPENDENT_AMBULATORY_CARE_PROVIDER_SITE_OTHER): Payer: Self-pay | Admitting: Family

## 2016-11-08 DIAGNOSIS — S91121A Laceration with foreign body of right great toe without damage to nail, initial encounter: Secondary | ICD-10-CM

## 2016-11-08 DIAGNOSIS — L089 Local infection of the skin and subcutaneous tissue, unspecified: Secondary | ICD-10-CM

## 2016-11-08 DIAGNOSIS — T148XXA Other injury of unspecified body region, initial encounter: Secondary | ICD-10-CM

## 2016-11-08 MED ORDER — DOXYCYCLINE HYCLATE 100 MG PO TABS: 100.0000 mg | ORAL_TABLET | Freq: Two times a day (BID) | ORAL | 0 refills | Status: DC

## 2016-11-08 NOTE — Progress Notes (Signed)
   Post-Op Visit Note   Patient: Scott HoppingWoodrow A Bouknight Jr.           Date of Birth: 07/05/1965           MRN: [id number] Visit Date: 11/08/2016 PCP: Shade FloodGreene, Jeffrey R, MD  Chief Complaint:  Chief Complaint  Patient presents with  . Right Foot - Routine Post Op    HPI:  The patient is a 51 year old gentleman who presents to 4 weeks status post laceration to his right great toe which was subsequently irrigated and debrided.Has been full weight bearing in regular shoe wear. Has completed his Augmentin.  Is worried about his toe, is red and warm. No drainage. No fever or chills.    Ortho Exam Incision healing well. Does have a 5 mm in diameter ulceration 1 mm deep to center of incision plantar great toe. no drainage. The great toe with erythema and warmth. No ascending cellulitis.  Visit Diagnoses:  1. Laceration w FB of right great toe w/o damage to nail, init   2. Wound infection     Plan:  continue with a neosporin dressing and daily cleansing until healed. Activities as tolerated. Will call in 2 weeks of doxycycline.  Follow-Up Instructions: Return in about 2 weeks (around 11/22/2016), or if symptoms worsen or fail to improve.   Imaging: No results found.  Orders:  No orders of the defined types were placed in this encounter.  Meds ordered this encounter  Medications  . doxycycline (VIBRA-TABS) 100 MG tablet    Sig: Take 1 tablet (100 mg total) by mouth 2 (two) times daily.    Dispense:  28 tablet    Refill:  0     PMFS History: Patient Active Problem List   Diagnosis Date Noted  . Type 2 diabetes mellitus with foot ulcer, without [place]-term current use of insulin (HCC)   . Hypokalemia   . Class 1 obesity due to excess calories with body mass index (BMI) of 33.0 to 33.9 in adult   . Wound infection 10/09/2016  . Hyperglycemia 10/09/2016  . Elevated blood pressure reading 10/09/2016  . Diabetic polyneuropathy associated with type 2 diabetes mellitus (HCC)   .  Laceration w FB of right great toe w/o damage to nail, init    Past Medical History:  Diagnosis Date  . Type II diabetes mellitus (HCC) dx'd 10/08/2016    Family History  Problem Relation Age of Onset  . Diabetes Father   . Lung cancer Other   . Stroke Other   . CAD Neg Hx   . Hypertension Neg Hx     Past Surgical History:  Procedure Laterality Date  . AMPUTATION Right 10/09/2016   Procedure: INCISION AND DRAINAGE RIGHT GREAT TOE;  Surgeon: Nadara Mustarduda, Marcus V, MD;  [facility];  Service: Orthopedics;  Laterality: Right;   Social History   Occupational History  . Not on file.   Social History Main Topics  . Smoking status: Never Smoker  . Smokeless tobacco: Never Used  . Alcohol use No  . Drug use: No  . Sexual activity: Not Currently

## 2016-11-21 ENCOUNTER — Encounter (INDEPENDENT_AMBULATORY_CARE_PROVIDER_SITE_OTHER): Payer: Self-pay | Admitting: Family

## 2016-11-21 ENCOUNTER — Ambulatory Visit (INDEPENDENT_AMBULATORY_CARE_PROVIDER_SITE_OTHER): Payer: 59 | Admitting: Orthopedic Surgery

## 2016-11-21 DIAGNOSIS — E11621 Type 2 diabetes mellitus with foot ulcer: Secondary | ICD-10-CM

## 2016-11-21 DIAGNOSIS — M6701 Short Achilles tendon (acquired), right ankle: Secondary | ICD-10-CM | POA: Insufficient documentation

## 2016-11-21 DIAGNOSIS — S91121A Laceration with foreign body of right great toe without damage to nail, initial encounter: Secondary | ICD-10-CM

## 2016-11-21 DIAGNOSIS — L97509 Non-pressure chronic ulcer of other part of unspecified foot with unspecified severity: Secondary | ICD-10-CM

## 2016-11-21 NOTE — Progress Notes (Signed)
Office Visit Note   Patient: Scott Navarro.           Date of Birth: 1965-04-12           MRN: [id number] Visit Date: 11/21/2016              Requested by: Shade Flood, MD [address] [place], [place] [address] PCP: Shade Flood, MD  Chief Complaint  Patient presents with  . Right Foot - Pain      HPI: Patient is a 51 year old gentleman diabetic insensate neuropathy status post debridement abscess right great toe with retained foreign body. Patient states he is making excellent progress has a few days of antibiotics left.  Assessment & Plan: Visit Diagnoses:  1. Laceration w FB of right great toe w/o damage to nail, init   2. Type 2 diabetes mellitus with foot ulcer, without [place]-term current use of insulin (HCC)   3. Achilles tendon contracture, right     Plan: Patient was given instructions and demonstrated heel cord stretching to do 5 times a minute of time. Antibiotic ointment and a Band-Aid.  Follow-Up Instructions: Return in about 4 weeks (around 12/19/2016).   Ortho Exam  Patient is alert, oriented, no adenopathy, well-dressed, normal affect, normal respiratory effort. Examination patient's incision is healed well there is one area of the scab that's 1 mm x 5 mm. It is no redness no cellulitis no tenderness to palpation he'll complete his course of antibiotics. Patient has significant heel cord contracture with dorsiflexion only to neutral with his knee extended. Patient also has hallux rigidus with dorsiflexion great toe of only 30.  Imaging: No results found. No images are attached to the encounter.  Labs: Lab Results  Component Value Date   HGBA1C 12.9 (H) 10/09/2016   ESRSEDRATE 92 (H) 10/09/2016   CRP 2.7 (H) 10/09/2016   REPTSTATUS 10/14/2016 FINAL 10/09/2016   GRAMSTAIN  10/09/2016    FEW WBC PRESENT, PREDOMINANTLY PMN ABUNDANT GRAM POSITIVE COCCI IN PAIRS MODERATE GRAM NEGATIVE RODS FEW GRAM POSITIVE RODS    CULT  10/09/2016   ABUNDANT PROTEUS MIRABILIS ABUNDANT ENTEROBACTER AEROGENES NO ANAEROBES ISOLATED    LABORGA PROTEUS MIRABILIS 10/09/2016   LABORGA ENTEROBACTER AEROGENES 10/09/2016    Orders:  No orders of the defined types were placed in this encounter.  No orders of the defined types were placed in this encounter.    Procedures: No procedures performed  Clinical Data: No additional findings.  ROS:  All other systems negative, except as noted in the HPI. Review of Systems  Objective: Vital Signs: There were no vitals taken for this visit.  Specialty Comments:  No specialty comments available.  PMFS History: Patient Active Problem List   Diagnosis Date Noted  . Achilles tendon contracture, right 11/21/2016  . Type 2 diabetes mellitus with foot ulcer, without Spurrier-term current use of insulin (HCC)   . Hypokalemia   . Class 1 obesity due to excess calories with body mass index (BMI) of 33.0 to 33.9 in adult   . Wound infection 10/09/2016  . Hyperglycemia 10/09/2016  . Elevated blood pressure reading 10/09/2016  . Diabetic polyneuropathy associated with type 2 diabetes mellitus (HCC)   . Laceration w FB of right great toe w/o damage to nail, init    Past Medical History:  Diagnosis Date  . Type II diabetes mellitus (HCC) dx'd 10/08/2016    Family History  Problem Relation Age of Onset  . Diabetes Father   . Lung  cancer Other   . Stroke Other   . CAD Neg Hx   . Hypertension Neg Hx     Past Surgical History:  Procedure Laterality Date  . AMPUTATION Right 10/09/2016   Procedure: INCISION AND DRAINAGE RIGHT GREAT TOE;  Surgeon: Nadara Mustard, MD;  Location: [facility] OR;  Service: Orthopedics;  Laterality: Right;   Social History   Occupational History  . Not on file.   Social History Main Topics  . Smoking status: Never Smoker  . Smokeless tobacco: Never Used  . Alcohol use No  . Drug use: No  . Sexual activity: Not Currently

## 2016-11-27 ENCOUNTER — Telehealth: Payer: Self-pay | Admitting: Family Medicine

## 2016-11-28 ENCOUNTER — Other Ambulatory Visit: Payer: Self-pay | Admitting: *Deleted

## 2016-11-28 DIAGNOSIS — E1142 Type 2 diabetes mellitus with diabetic polyneuropathy: Secondary | ICD-10-CM

## 2016-11-28 MED ORDER — GLUCOSE BLOOD VI STRP: ORAL_STRIP | 12 refills | Status: DC

## 2016-11-28 NOTE — Telephone Encounter (Signed)
Patient was diagnosed in at [facility].  Is going to come in for a follow up with Dr. Neva Seat before existing prescription runs out.

## 2016-11-28 NOTE — Telephone Encounter (Signed)
Patient called in stating that the pharmacy will only give him 25 test strips because it is not listed on the instructions how many times a day he is suppose to test. He needs it to stat he test 4 times a day so he can get the 100 count test strips. Can someone call [facility] to the [facility] on [address] and correct this for him.

## 2016-11-29 ENCOUNTER — Other Ambulatory Visit: Payer: Self-pay

## 2016-11-29 ENCOUNTER — Telehealth: Payer: Self-pay | Admitting: Family Medicine

## 2016-11-29 MED ORDER — GLUCOSE BLOOD VI STRP: ORAL_STRIP | 12 refills | Status: DC

## 2016-12-19 ENCOUNTER — Encounter (INDEPENDENT_AMBULATORY_CARE_PROVIDER_SITE_OTHER): Payer: Self-pay | Admitting: Orthopedic Surgery

## 2016-12-19 ENCOUNTER — Ambulatory Visit (INDEPENDENT_AMBULATORY_CARE_PROVIDER_SITE_OTHER): Payer: 59 | Admitting: Orthopedic Surgery

## 2016-12-19 DIAGNOSIS — E1142 Type 2 diabetes mellitus with diabetic polyneuropathy: Secondary | ICD-10-CM

## 2016-12-19 DIAGNOSIS — S91121A Laceration with foreign body of right great toe without damage to nail, initial encounter: Secondary | ICD-10-CM

## 2016-12-19 MED ORDER — DOXYCYCLINE HYCLATE 100 MG PO TABS: 100.0000 mg | ORAL_TABLET | Freq: Two times a day (BID) | ORAL | 0 refills | Status: DC

## 2016-12-19 NOTE — Progress Notes (Signed)
Office Visit Note   Patient: Scott HoppingWoodrow A Hunsberger Jr.           Date of Birth: 06-28-1965           MRN: [id number] Visit Date: 12/19/2016              Requested by: Shade FloodGreene, Jeffrey R, MD [address] [place], [place] [address] PCP: Shade FloodGreene, Jeffrey R, MD  Chief Complaint  Patient presents with  . Right Great Toe - Follow-up      HPI: Patient is a 51 year old gentleman diabetic insensate neuropathy status post debridement abscess right great toe with retained foreign body. He has been full weight bearing, returned to work. Has been doing Neosporin dressings daily. Continues to have some drainage daily.  Assessment & Plan: Visit Diagnoses:  1. Laceration w FB of right great toe w/o damage to nail, init   2. Diabetic polyneuropathy associated with type 2 diabetes mellitus (HCC)     Plan: Patient was given instructions and demonstrated heel cord stretching to do 5 times a minute of time. Did provide a felt pressure relieving pad for shoe wear. Antibiotic ointment and a Band-Aid. Will follow up in 4 more weeks.   Follow-Up Instructions: Return in about 4 weeks (around 01/16/2017).   Ortho Exam  Patient is alert, oriented, no adenopathy, well-dressed, normal affect, normal respiratory effort. Examination majority of patient's incision is healed. Beneath distal phalanx is ulceration along incision is 2 mm x 4.  Was debrided of surrounding nonviable tissue. Mild erythema to toe. No cellulitis. No odor. no tenderness to palpation. Patient has significant heel cord contracture with dorsiflexion only to neutral with his knee extended. Patient also has hallux rigidus with dorsiflexion great toe of only 30.  Imaging: No results found. No images are attached to the encounter.  Labs: Lab Results  Component Value Date   HGBA1C 12.9 (H) 10/09/2016   ESRSEDRATE 92 (H) 10/09/2016   CRP 2.7 (H) 10/09/2016   REPTSTATUS 10/14/2016 FINAL 10/09/2016   GRAMSTAIN  10/09/2016    FEW WBC PRESENT,  PREDOMINANTLY PMN ABUNDANT GRAM POSITIVE COCCI IN PAIRS MODERATE GRAM NEGATIVE RODS FEW GRAM POSITIVE RODS    CULT  10/09/2016    ABUNDANT PROTEUS MIRABILIS ABUNDANT ENTEROBACTER AEROGENES NO ANAEROBES ISOLATED    LABORGA PROTEUS MIRABILIS 10/09/2016   LABORGA ENTEROBACTER AEROGENES 10/09/2016    Orders:  No orders of the defined types were placed in this encounter.  Meds ordered this encounter  Medications  . doxycycline (VIBRA-TABS) 100 MG tablet    Sig: Take 1 tablet (100 mg total) by mouth 2 (two) times daily.    Dispense:  60 tablet    Refill:  0     Procedures: No procedures performed  Clinical Data: No additional findings.  ROS:  All other systems negative, except as noted in the HPI. Review of Systems  Constitutional: Negative for chills and fever.  Skin: Positive for wound. Negative for color change.    Objective: Vital Signs: There were no vitals taken for this visit.  Specialty Comments:  No specialty comments available.  PMFS History: Patient Active Problem List   Diagnosis Date Noted  . Achilles tendon contracture, right 11/21/2016  . Type 2 diabetes mellitus with foot ulcer, without Ramp-term current use of insulin (HCC)   . Hypokalemia   . Class 1 obesity due to excess calories with body mass index (BMI) of 33.0 to 33.9 in adult   . Wound infection 10/09/2016  . Hyperglycemia 10/09/2016  .  Elevated blood pressure reading 10/09/2016  . Diabetic polyneuropathy associated with type 2 diabetes mellitus (HCC)   . Laceration w FB of right great toe w/o damage to nail, init    Past Medical History:  Diagnosis Date  . Type II diabetes mellitus (HCC) dx'd 10/08/2016    Family History  Problem Relation Age of Onset  . Diabetes Father   . Lung cancer Other   . Stroke Other   . CAD Neg Hx   . Hypertension Neg Hx     Past Surgical History:  Procedure Laterality Date  . AMPUTATION Right 10/09/2016   Procedure: INCISION AND DRAINAGE RIGHT GREAT  TOE;  Surgeon: Nadara Mustard, MD;  Location: [facility] OR;  Service: Orthopedics;  Laterality: Right;   Social History   Occupational History  . Not on file.   Social History Main Topics  . Smoking status: Never Smoker  . Smokeless tobacco: Never Used  . Alcohol use No  . Drug use: No  . Sexual activity: Not Currently

## 2017-01-16 ENCOUNTER — Ambulatory Visit (INDEPENDENT_AMBULATORY_CARE_PROVIDER_SITE_OTHER): Payer: 59 | Admitting: Orthopedic Surgery

## 2017-01-16 ENCOUNTER — Ambulatory Visit (INDEPENDENT_AMBULATORY_CARE_PROVIDER_SITE_OTHER): Payer: Self-pay

## 2017-01-16 ENCOUNTER — Encounter (INDEPENDENT_AMBULATORY_CARE_PROVIDER_SITE_OTHER): Payer: Self-pay | Admitting: Family

## 2017-01-16 VITALS — Ht 71.0 in | Wt 224.0 lb

## 2017-01-16 DIAGNOSIS — L97521 Non-pressure chronic ulcer of other part of left foot limited to breakdown of skin: Secondary | ICD-10-CM

## 2017-01-16 NOTE — Progress Notes (Addendum)
Office Visit Note   Patient: Scott HoppingWoodrow A Cadiente Jr.           Date of Birth: August 19, 1965           MRN: [id number] Visit Date: 01/16/2017              Requested by: Shade FloodGreene, Jeffrey R, MD [address] [place], [place] [address] PCP: Shade FloodGreene, Jeffrey R, MD  Chief Complaint  Patient presents with  . Right Great Toe - Follow-up      HPI: Patient is a 51 year old gentleman who is seen in follow-up for swelling right great toe with previous incision and drainage and removal of a foreign body on 10/09/2016.  Patient states that he has had recurrent swelling with episodes of increased ulceration.  Patient states he was doing fine until he ambulated more this past week and has had an increased ulcer with drainage.  He has been using antibiotic ointment.  There is no drainage at this time.  Assessment & Plan: Visit Diagnoses:  1. Non-pressure chronic ulcer of other part of left foot limited to breakdown of skin (HCC)     Plan: With the ulcer extending down to the capsule of the IP joint there is concern for possible osteomyelitis.  With recurrent ulcerations this does seem more likely than not that there is some underlying osteomyelitis.  We will obtain an MRI scan of the right foot.  Discussed the treatment could include IV antibiotics versus amputation surgery.  Follow-Up Instructions: Return in about 2 weeks (around 01/30/2017).   Ortho Exam  Patient is alert, oriented, no adenopathy, well-dressed, normal affect, normal respiratory effort. Patient has a good dorsalis pedis pulse.  Patient has limited range of motion of the MTP joint but no pain with range of motion.  Examination of the plantar aspect of the IP joints shows a necrotic ulcer with clear fluid and odor.  After informed consent a 10 blade knife was used to debride the skin and soft tissue down to healthy viable granulation tissue.  Ulcer is 15 mm in diameter and 3 mm deep after debridement with good healthy granulation tissue.  This  was touched with silver nitrate there is no purulence there is no exposed bone or tendon.  There is good extending to extend down to the capsule.  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  No images are attached to the encounter.  Labs: Lab Results  Component Value Date   HGBA1C 12.9 (H) 10/09/2016   ESRSEDRATE 92 (H) 10/09/2016   CRP 2.7 (H) 10/09/2016   REPTSTATUS 10/14/2016 FINAL 10/09/2016   GRAMSTAIN  10/09/2016    FEW WBC PRESENT, PREDOMINANTLY PMN ABUNDANT GRAM POSITIVE COCCI IN PAIRS MODERATE GRAM NEGATIVE RODS FEW GRAM POSITIVE RODS    CULT  10/09/2016    ABUNDANT PROTEUS MIRABILIS ABUNDANT ENTEROBACTER AEROGENES NO ANAEROBES ISOLATED    LABORGA PROTEUS MIRABILIS 10/09/2016   LABORGA ENTEROBACTER AEROGENES 10/09/2016    Orders:  Orders Placed This Encounter  Procedures  . XR Foot Complete Right   No orders of the defined types were placed in this encounter.    Procedures: No procedures performed  Clinical Data: No additional findings.  ROS:  All other systems negative, except as noted in  the HPI. Review of Systems  Objective: Vital Signs: Ht 5\' 11"  (1.803 m)   Wt 224 lb (101.6 kg)   BMI 31.24 kg/m   Specialty Comments:  No specialty comments available.  PMFS History: Patient Active Problem List   Diagnosis Date Noted  . Achilles tendon contracture, right 11/21/2016  . Type 2 diabetes mellitus with foot ulcer, without [place]-term current use of insulin (HCC)   . Hypokalemia   . Class 1 obesity due to excess calories with body mass index (BMI) of 33.0 to 33.9 in adult   . Wound infection 10/09/2016  . Hyperglycemia 10/09/2016  . Elevated blood pressure reading 10/09/2016  . Diabetic polyneuropathy  associated with type 2 diabetes mellitus (HCC)   . Laceration w FB of right great toe w/o damage to nail, init    Past Medical History:  Diagnosis Date  . Type II diabetes mellitus (HCC) dx'd 10/08/2016    Family History  Problem Relation Age of Onset  . Diabetes Father   . Lung cancer Other   . Stroke Other   . CAD Neg Hx   . Hypertension Neg Hx     Past Surgical History:  Procedure Laterality Date  . AMPUTATION Right 10/09/2016   Procedure: INCISION AND DRAINAGE RIGHT GREAT TOE;  Surgeon: Nadara Mustarduda, Shilee Biggs V, MD;  [facility];  Service: Orthopedics;  Laterality: Right;   Social History   Occupational History  . Not on file  Tobacco Use  . Smoking status: Never Smoker  . Smokeless tobacco: Never Used  Substance and Sexual Activity  . Alcohol use: No  . Drug use: No  . Sexual activity: Not Currently

## 2017-01-20 ENCOUNTER — Telehealth (INDEPENDENT_AMBULATORY_CARE_PROVIDER_SITE_OTHER): Payer: Self-pay | Admitting: Radiology

## 2017-01-20 MED ORDER — DOXYCYCLINE HYCLATE 100 MG PO TABS: 100.0000 mg | ORAL_TABLET | Freq: Two times a day (BID) | ORAL | 0 refills | Status: DC

## 2017-01-20 NOTE — Telephone Encounter (Signed)
I did call and speak with patient, MRI was denied by insurance. Advised Dr. Duda would like to start him on 30 day course of doxycycline. AdviseLajoyce Cornersd that we would see him back in the office in 3 weeks, he already had an appointment scheduled 12/10, we will leave this appointment and rexray at follow up. Patient is currently out of town. [facility] [place] [place] [facility] [address] This was sent in for him.

## 2017-02-07 ENCOUNTER — Telehealth (INDEPENDENT_AMBULATORY_CARE_PROVIDER_SITE_OTHER): Payer: Self-pay | Admitting: Orthopedic Surgery

## 2017-02-07 NOTE — Telephone Encounter (Signed)
Called patient got recording mailbox is full   Could not leave message [phone]

## 2017-02-10 ENCOUNTER — Ambulatory Visit (INDEPENDENT_AMBULATORY_CARE_PROVIDER_SITE_OTHER): Payer: 59 | Admitting: Orthopedic Surgery

## 2018-07-07 IMAGING — CR DG TOE GREAT 2+V*R*
3 series · 3 of 3 positions shown · non-contrast
Comparison: None.

CLINICAL DATA: Infection.

EXAM:
RIGHT GREAT TOE

[t toes ap right]
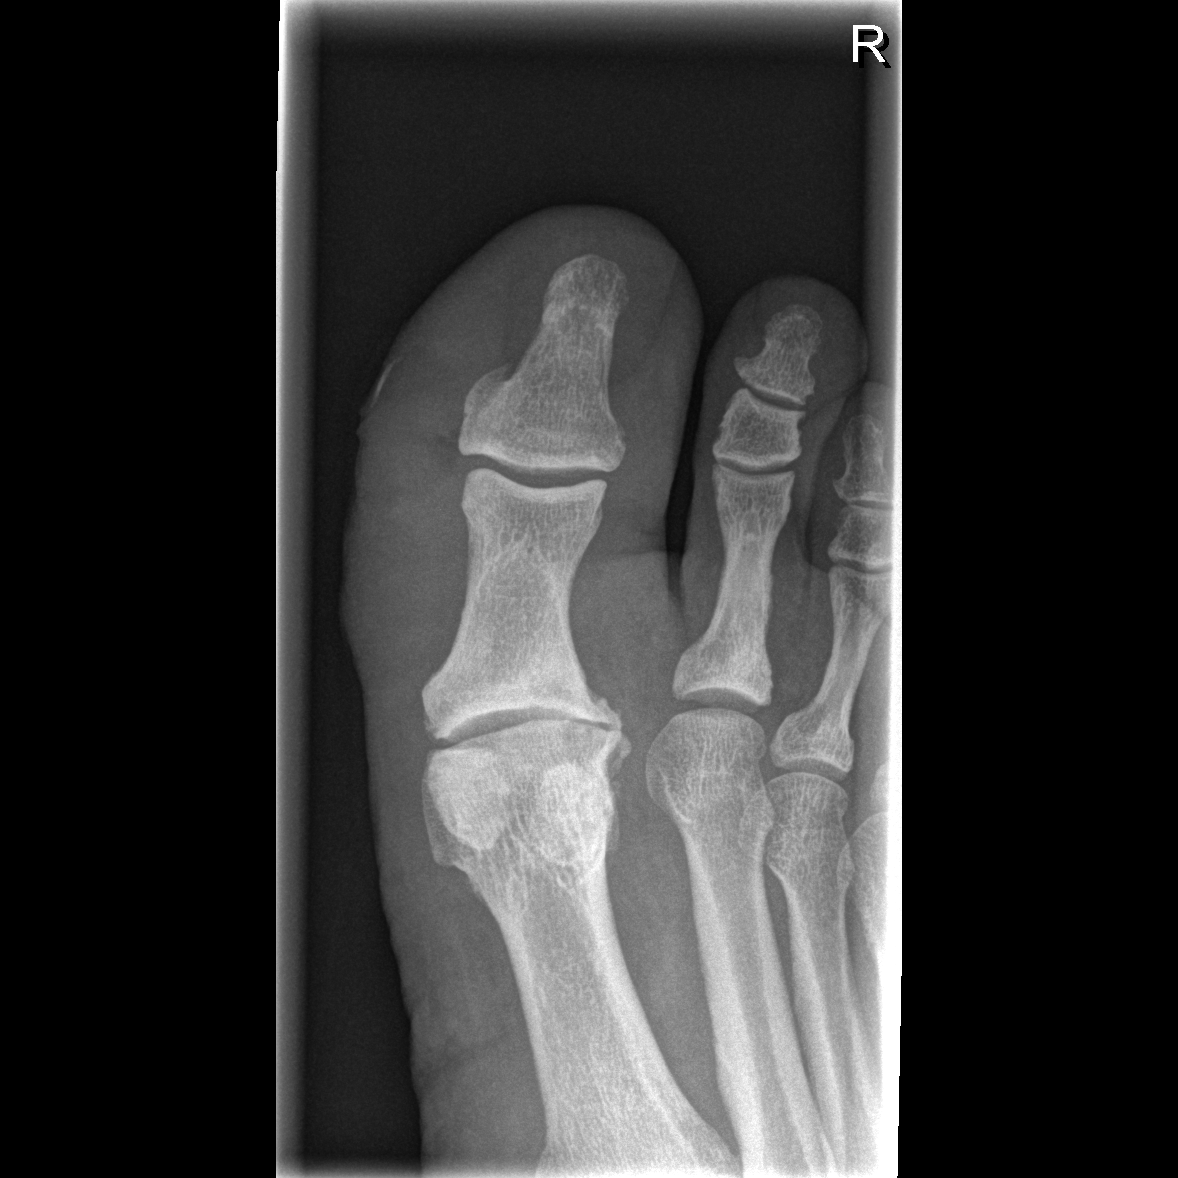

[t toes oblique right]
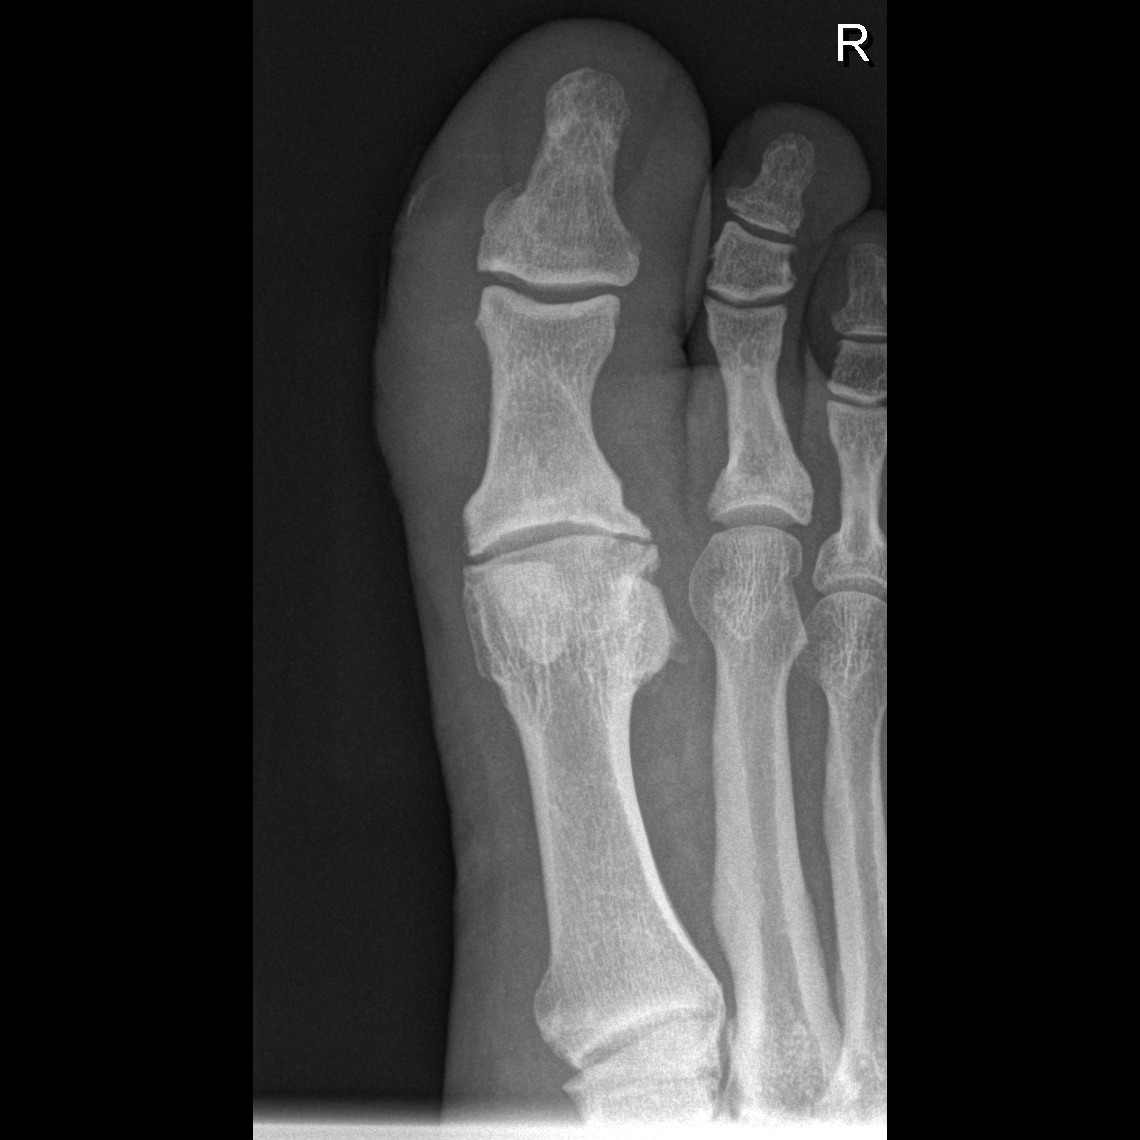

[t toes lateral right]
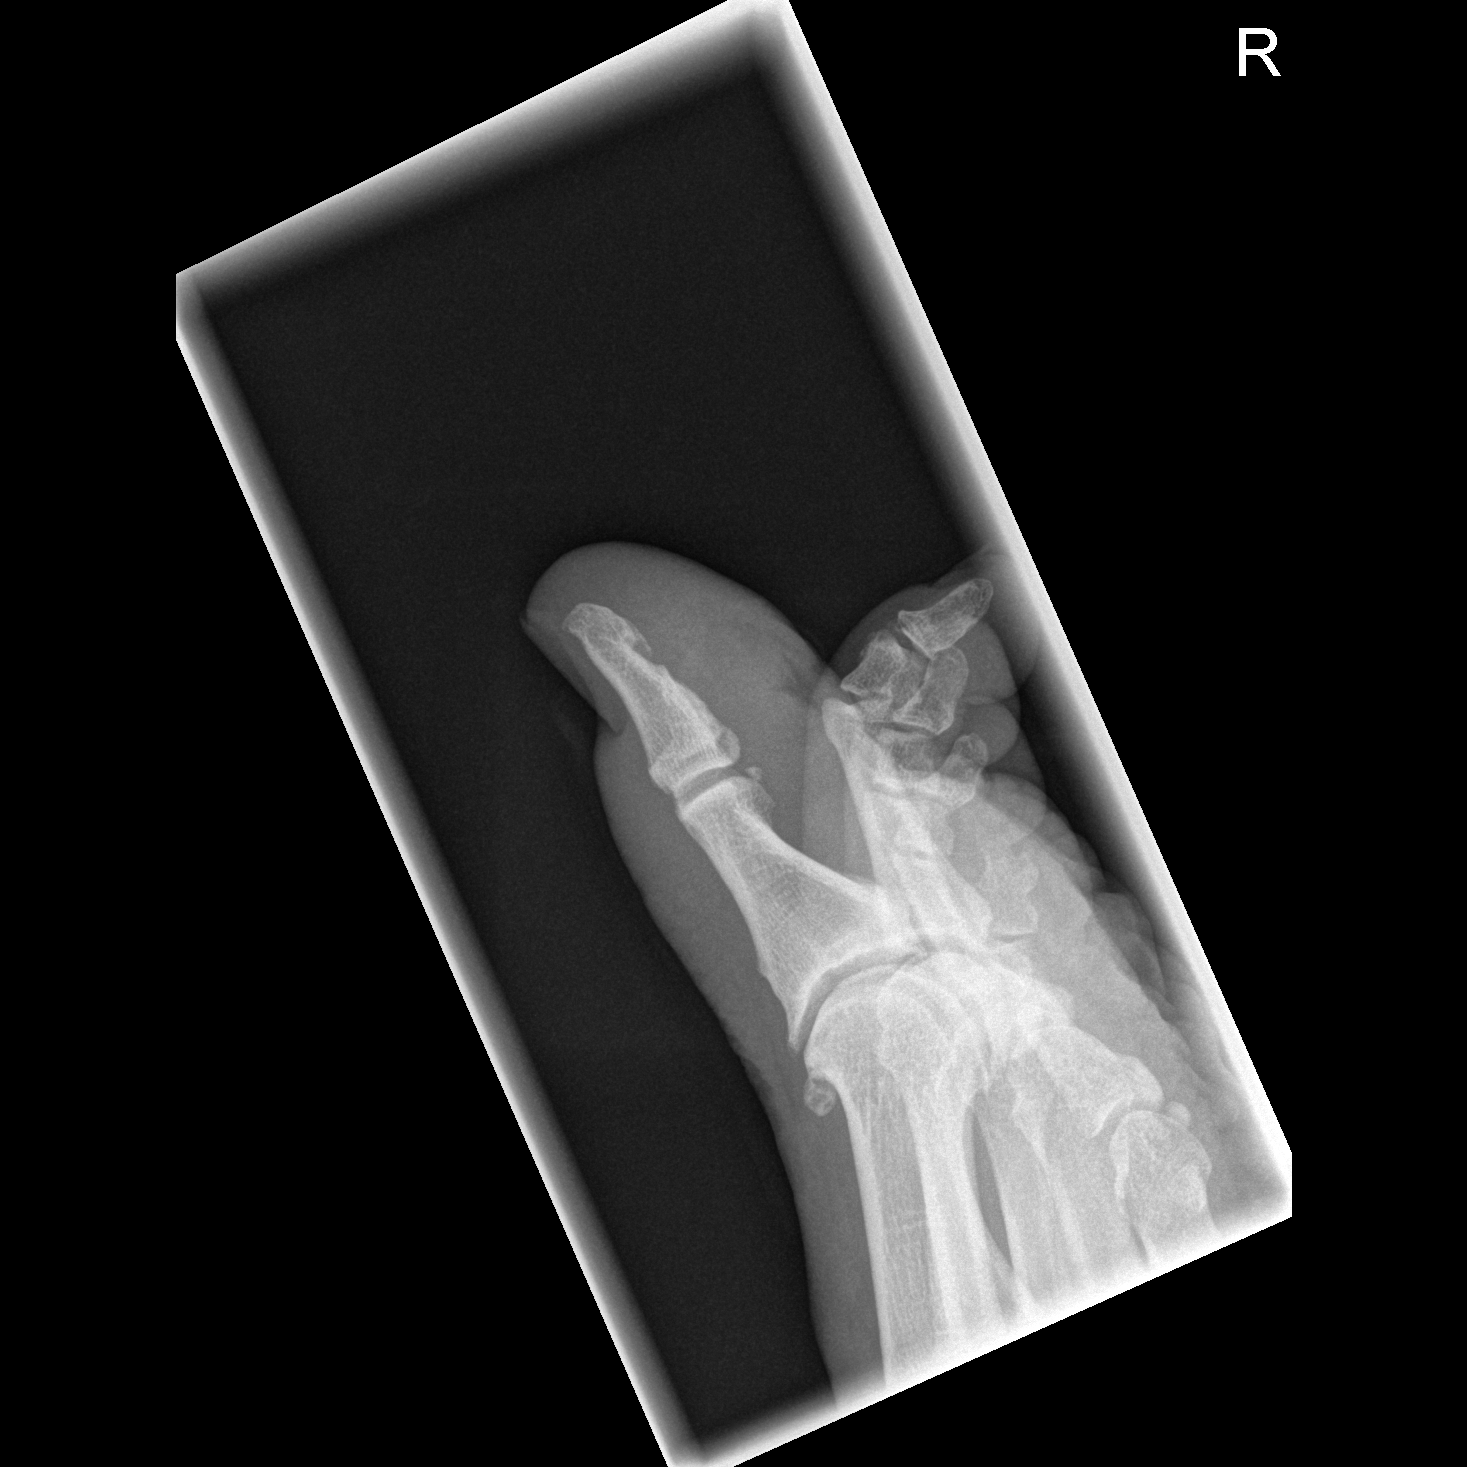

[3 of 3 positions shown; findings below may reference images not displayed]

FINDINGS: An 8 mm curvilinear density is noted superficially within the medial
aspect of the right great toe. There is extensive soft tissue
swelling with a puncture wound along the plantar surface of the
great toe medially. No acute or focal osseous abnormality is
present. Degenerative changes are present at the first MTP joint.
IMPRESSION: 1. Extensive soft tissue swelling and puncture wound along the
plantar and medial aspect of the right great toe.
2. An 8 mm curvilinear density compatible with the residual foreign
body.
3. No acute or focal osseous abnormality associated with the soft
tissue swelling.

## 2019-04-23 ENCOUNTER — Ambulatory Visit: Payer: 59 | Attending: Internal Medicine

## 2019-04-23 DIAGNOSIS — Z23 Encounter for immunization: Secondary | ICD-10-CM | POA: Insufficient documentation

## 2019-04-23 NOTE — Progress Notes (Signed)
   [id number]  Name:  Jaremy Nosal.    MRN: [id number] DOB: [birth year]  04/23/2019  Mr. Madonna was observed post Covid-19 immunization for 15 minutes without incidence. He was provided with Vaccine Information Sheet and instruction to access the V-Safe system.   Mr. Edgett was instructed to call 911 with any severe reactions post vaccine: Marland Kitchen Difficulty breathing  . Swelling of your face and throat  . A fast heartbeat  . A bad rash all over your body  . Dizziness and weakness    Immunizations Administered    Name Date Dose VIS Date Route   Pfizer COVID-19 Vaccine 04/23/2019  9:44 AM 0.3 mL 02/12/2019 Intramuscular   Manufacturer: [employer], [employer]   Lot: [id number]   NDC: [address]-3

## 2019-05-18 ENCOUNTER — Ambulatory Visit: Payer: 59 | Attending: Internal Medicine

## 2019-05-18 DIAGNOSIS — Z23 Encounter for immunization: Secondary | ICD-10-CM

## 2019-05-18 NOTE — Progress Notes (Signed)
   [id number]  Name:  Scott Navarro.    MRN: [id number] DOB: 11-15-1965  05/18/2019  [name] was observed post Covid-19 immunization for 15 minutes without incident. He was provided with Vaccine Information Sheet and instruction to access the V-Safe system.   Mr. Grape was instructed to call 911 with any severe reactions post vaccine: Marland Kitchen Difficulty breathing  . Swelling of face and throat  . A fast heartbeat  . A bad rash all over body  . Dizziness and weakness   Immunizations Administered    Name Date Dose VIS Date Route   Pfizer COVID-19 Vaccine 05/18/2019  8:16 AM 0.3 mL 02/12/2019 Intramuscular   Manufacturer: [employer], [employer]   Lot: [id number]   NDC: [address]-5

## 2022-11-21 ENCOUNTER — Ambulatory Visit: Payer: 59 | Admitting: Medical

## 2022-11-21 ENCOUNTER — Encounter: Payer: Self-pay | Admitting: Medical

## 2022-11-21 VITALS — BP 122/80 | HR 98 | Wt 199.8 lb

## 2022-11-21 DIAGNOSIS — E133593 Other specified diabetes mellitus with proliferative diabetic retinopathy without macular edema, bilateral: Secondary | ICD-10-CM | POA: Diagnosis not present

## 2022-11-21 DIAGNOSIS — H3560 Retinal hemorrhage, unspecified eye: Secondary | ICD-10-CM | POA: Diagnosis not present

## 2022-11-21 DIAGNOSIS — H409 Unspecified glaucoma: Secondary | ICD-10-CM | POA: Insufficient documentation

## 2022-11-21 DIAGNOSIS — R7309 Other abnormal glucose: Secondary | ICD-10-CM | POA: Insufficient documentation

## 2022-11-21 LAB — POCT URINALYSIS DIP (PROADVANTAGE DEVICE)
Bilirubin, UA: NEGATIVE
Glucose, UA: >=1000 mg/dL — AB
Ketones, POC UA: NEGATIVE mg/dL
Leukocytes, UA: NEGATIVE
Nitrite, UA: NEGATIVE
Protein Ur, POC: 100 mg/dL — AB
Specific Gravity, Urine: 1.01
Urobilinogen, Ur: NEGATIVE
pH, UA: 7 (ref 5.0–8.0)

## 2022-11-21 LAB — POCT GLYCOSYLATED HEMOGLOBIN (HGB A1C): Hemoglobin A1C: 12.6 % — AB (ref 4.0–5.6)

## 2022-11-21 LAB — GLUCOSE, POCT (MANUAL RESULT ENTRY): POC Glucose: 493 mg/dl — AB (ref 70–99)

## 2022-11-21 MED ORDER — INSULIN LISPRO (1 UNIT DIAL) 100 UNIT/ML (KWIKPEN): 10.0000 [IU] | PEN_INJECTOR | Freq: Three times a day (TID) | SUBCUTANEOUS | Status: DC

## 2022-11-21 NOTE — Patient Instructions (Addendum)
Your sugar today is way too high 493!  We discussed the option of going to the emergency department.  You declined this for now.  I have ordered some stat labs.  We will call later this evening when we get these labs back.  I need you to drink a liter of water per hour for the next couple hours as we try to get your sugars under better control  I have a sample of Humalog KwikPen that we gave you today.  This is fast acting mealtime insulin.  We gave you 10 units of insulin at 3:00 today p.m.  I want you to check your sugar again in 1 hour.  Assuming your sugar is still greater than 300 an hour for now, then give yourself another 10 units.  We will call back for further instructions.  If you are labs show that your kidney marker is abnormally high then I will have to have you go to the emergency department.  Please call Dr. Radford Pax office [phone] when you leave here today to ask them if they can still do the surgery tomorrow with your sugar being that high.  You have really 2 different emergencies, the eye conditions and the sugar.     The goal over the next few days is to increase your hydration, get the insulin on board, get the sugars down and to stabilize your sugars out of the danger zone.  This is better done in the hospital setting which is why recommended going to the emergency department.    If we are successful in getting your sugars a little bit lower out of the danger zone, then we will likely switch you to 10 units of mealtime insulin Humalog Kwikpen,  3 times a day with meals             In general once your sugars are out of the danger zone, you need to make significant diet changes.  I have included a sample diet plan: Breakfast You may eat 1 of the following Omelette, which can include a small amount of cheese, and vegetables such as peppers, mushrooms, small pieces of [place] or chicken Low sugar yogurt serving which can include some fruit such as  berries Egg whites or hard boiled egg and meat (1-2 strips of bacon, or small piece of [place] sausage or [place] bacon)    Lunch A protein source such as 1 of the following: 1 serving of beans such as black beans, pinto beans, green beans, or edamame (soy beans) 1 meat serving such as 6 oz or deck of card size serving of fish, skinless chicken, or [place], either grilled or baked preferably.   You can use some pork or beef, but limit this compared to fish, chicken or [place] Vegetable - Half of your plate should be a non-starchy vegetables!  So avoid white potatoes and corn.  Otherwise, eat a large portion of vegetables. Avocado, cucumber, tomato, carrots, greens, lettuce, squash, okra, etc.  Vegetables can include salad with olive oil/vinaigrette dressing    Dinner A protein source such as 1 of the following: 1 serving of beans such as black beans, pinto beans, green beans, or edamame (soy beans) 1 meat serving such as 6 oz or deck of card size serving of fish, skinless chicken, or [place], either grilled or baked preferably.   You can use some pork or beef, but limit this compared to fish, chicken or [place] Vegetable - Half of your plate should be a  non-starchy vegetables!  So avoid white potatoes and corn.  Otherwise, eat a large portion of vegetables. Avocado, cucumber, tomato, carrots, greens, lettuce, squash, okra, etc.  Vegetables can include salad with olive oil/vinaigrette dressing   Beverages: Water Unsweet tea Home made juice with a juicer without sugar added other than small bit of honey or agave nectar Water with sugar free flavor such as Mio   AVOID.... For the time being I want you to cut out the following items completely: Soda, sweet tea, juice, beer or wine or alcohol ALL grains and breads including rice, pasta, bread, cereal Sweets such as cake, candy, pies, chips, cookies, chocolate

## 2022-11-21 NOTE — Progress Notes (Signed)
Subjective:  Scott Navarro. is a 57 y.o. male who presents for Chief Complaint  Patient presents with   Medical Management of Chronic Issues    New pt get established, uncontrolled diabetes, hasn't been on meds or had blood work in 2-3 years. Having laser eye surgery Tomorrow.       Here as a new patient today.  He notes hx/o diabetes, diagnosed 2018.  He notes last using medication 2018, was on medication for a while, then ran out.  Instead of following up, he worked on lifestyle changes.  Was lost to follow up.    He recently saw an eye doctor, was referred to ophthalmology with [facility] in [place].  Due to the severity was transferred to [facility] in [place].  He saw them yesterday. His condition was severe enough they felt he needed surgery right away. They scheduled one surgery tomorrow, and a second surgery on Monday with a different ophthalmology specialist at [facility].    Here today to establish care.   Other than the eye issues, he denies new symptoms.  He denies polyuria, polydipsia, weight change.  He notes that he does sometimes drink sugary drinks or splurge on sweets.  He has used freestyle libre in the past, and really did not like using lancets to prick fingers.    He has not checked his sugar in years.   No other aggravating or relieving factors.    No other c/o.   Past Medical History:  Diagnosis Date   Type II diabetes mellitus (HCC) dx'd 10/08/2016   No current outpatient medications on file prior to visit.   No current facility-administered medications on file prior to visit.    The following portions of the patient's history were reviewed and updated as appropriate: allergies, current medications, past family history, past medical history, past social history, past surgical history and problem list.  ROS Otherwise as in subjective above    Objective: BP 122/80   Pulse 98   Wt 199 lb 12.8 oz (90.6 kg)   BMI 27.87 kg/m   General appearance:  alert, no distress, well developed, well nourished Neck: supple, no lymphadenopathy, no thyromegaly, no masses Heart: RRR, normal S1, S2, no murmurs Lungs: CTA bilaterally, no wheezes, rhonchi, or rales Pulses: 2+ radial pulses, 2+ pedal pulses, normal cap refill Ext: no edema Skin: warm, dry   Assessment: Encounter Diagnoses  Name Primary?   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  We discussed his serious severe eye issues including retinal hemorrhage and glaucoma recently diagnosed.  Pending surgery tomorrow.  Blood sugar today in the office 493.  No ketones in UA.    I called to speak to [facility] ophthalmology in [facility], Dr. Tresa Endo Muir's office, [phone].  I spoke to the front office, asked to speak to Dr. Corky Sing.  He was placed on hold, but after a while I had to go and see another patient.  I was not able to communicate directly with Dr. Corky Sing.  I will have the patient call their office today when he leaves here  We discussed the serious nature of his blood sugars.  He declines emergency department visit today.  We were able  to place a freestyle libre today.  We gave him 10 units of insulin Humalog KwikPen in the office at 3 PM  STAT labs sent.  We discussed the following recommendations:   Patient Instructions  Your sugar today is way too high 493!  We discussed the option of going to the emergency department.  You declined this for now.  I have ordered some stat labs.  We will call later this evening when we get these labs back.  I need you to drink a liter of water per hour for the next couple hours as we try to get your sugars  under better control  I have a sample of Humalog KwikPen that we gave you today.  This is fast acting mealtime insulin.  We gave you 10 units of insulin at 3:00 today p.m.  I want you to check your sugar again in 1 hour.  Assuming your sugar is still greater than 300 an hour for now, then give yourself another 10 units.  We will call back for further instructions.  If you are labs show that your kidney marker is abnormally high then I will have to have you go to the emergency department.  Please call Dr. Radford Pax office [phone] when you leave here today to ask them if they can still do the surgery tomorrow with your sugar being that high.  You have really 2 different emergencies, the eye conditions and the sugar.     The goal over the next few days is to increase your hydration, get the insulin on board, get the sugars down and to stabilize your sugars out of the danger zone.  This is better done in the hospital setting which is why recommended going to the emergency department.    If we are successful in getting your sugars a little bit lower out of the danger zone, then we will likely switch you to 10 units of mealtime insulin Humalog Kwikpen,  3 times a day with meals             In general once your sugars are out of the danger zone, you need to make significant diet changes.  I have included a sample diet plan: Breakfast You may eat 1 of the following Omelette, which can include a small amount of cheese, and vegetables such as peppers, mushrooms, small pieces of [place] or chicken Low sugar yogurt serving which can include some fruit such as berries Egg whites or hard boiled egg and meat (1-2 strips of bacon, or small piece of [place] sausage or [place] bacon)    Lunch A protein source such as 1 of the following: 1 serving of beans such as black beans, pinto beans, green beans, or edamame (soy beans) 1 meat serving such as 6 oz or deck of card size serving of  fish, skinless chicken, or [place], either grilled or baked preferably.   You can use some pork or beef, but limit this compared to fish, chicken or [place] Vegetable - Half of your plate should be a non-starchy vegetables!  So avoid white potatoes and corn.  Otherwise, eat a large portion of vegetables. Avocado, cucumber, tomato, carrots, greens, lettuce, squash, okra, etc.  Vegetables can include salad with olive oil/vinaigrette dressing    Dinner A protein source such as 1 of the following: 1 serving of beans such as black beans, pinto beans, green beans, or edamame (soy beans) 1 meat serving such as 6 oz or deck of card size serving  of fish, skinless chicken, or [place], either grilled or baked preferably.   You can use some pork or beef, but limit this compared to fish, chicken or [place] Vegetable - Half of your plate should be a non-starchy vegetables!  So avoid white potatoes and corn.  Otherwise, eat a large portion of vegetables. Avocado, cucumber, tomato, carrots, greens, lettuce, squash, okra, etc.  Vegetables can include salad with olive oil/vinaigrette dressing   Beverages: Water Unsweet tea Home made juice with a juicer without sugar added other than small bit of honey or agave nectar Water with sugar free flavor such as Mio   AVOID.... For the time being I want you to cut out the following items completely: Soda, sweet tea, juice, beer or wine or alcohol ALL grains and breads including rice, pasta, bread, cereal Sweets such as cake, candy, pies, chips, cookies, chocolate    Wataru "[name]" was seen today for medical management of chronic issues.  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   Follow up: pending labs

## 2022-11-21 NOTE — Progress Notes (Signed)
I have tried to call you several times this evening.  Your voicemail was full.  Your kidney marker is okay.  If you have done 2 doses of insulin already this evening and if your sugar is still over 300 this evening, go ahead and do another 10 units of insulin.  Starting tomorrow do 10 units 3 times a day with meals if sugars happen to be under 250 before meals  If not, if your sugars are still in the 300s or 400s prior to breakfast or meals, then do 14 units 3 times a day with meals and then follow-up Monday

## 2022-11-22 LAB — MICROALBUMIN / CREATININE URINE RATIO
Creatinine, Urine: 54.1 mg/dL
Microalb/Creat Ratio: 2413 mg/g creat — ABNORMAL HIGH (ref 0–29)
Microalbumin, Urine: 1305.5 ug/mL

## 2022-11-22 LAB — BASIC METABOLIC PANEL
BUN/Creatinine Ratio: 13 (ref 9–20)
BUN: 16 mg/dL (ref 6–24)
CO2: 26 mmol/L (ref 20–29)
Calcium: 9 mg/dL (ref 8.7–10.2)
Chloride: 94 mmol/L — ABNORMAL LOW (ref 96–106)
Creatinine, Ser: 1.24 mg/dL (ref 0.76–1.27)
Glucose: 546 mg/dL (ref 70–99)
Potassium: 3.3 mmol/L — ABNORMAL LOW (ref 3.5–5.2)
Sodium: 131 mmol/L — ABNORMAL LOW (ref 134–144)
eGFR: 68 mL/min/{1.73_m2} (ref >59)

## 2022-11-22 LAB — CBC
Hematocrit: 39.3 % (ref 37.5–51.0)
Hemoglobin: 13.1 g/dL (ref 13.0–17.7)
MCH: 30 pg (ref 26.6–33.0)
MCHC: 33.3 g/dL (ref 31.5–35.7)
MCV: 90 fL (ref 79–97)
Platelets: 196 10*3/uL (ref 150–450)
RBC: 4.36 x10E6/uL (ref 4.14–5.80)
RDW: 13.6 % (ref 11.6–15.4)
WBC: 11.2 10*3/uL — ABNORMAL HIGH (ref 3.4–10.8)

## 2022-11-25 MED ORDER — FREESTYLE LIBRE 3 PLUS SENSOR MISC: 2 refills | Status: DC

## 2022-11-25 NOTE — Addendum Note (Signed)
Addended by: Herminio Commons A on: 11/25/2022 01:38 PM   Modules accepted: Orders

## 2022-11-28 ENCOUNTER — Ambulatory Visit: Payer: 59 | Admitting: Medical

## 2022-11-28 ENCOUNTER — Encounter: Payer: Self-pay | Admitting: Medical

## 2022-11-28 VITALS — BP 124/70 | HR 92 | Wt 198.0 lb

## 2022-11-28 DIAGNOSIS — H3560 Retinal hemorrhage, unspecified eye: Secondary | ICD-10-CM | POA: Diagnosis not present

## 2022-11-28 DIAGNOSIS — R2681 Unsteadiness on feet: Secondary | ICD-10-CM | POA: Diagnosis not present

## 2022-11-28 DIAGNOSIS — E1165 Type 2 diabetes mellitus with hyperglycemia: Secondary | ICD-10-CM

## 2022-11-28 MED ORDER — MOUNJARO 2.5 MG/0.5ML ~~LOC~~ SOAJ: 2.5000 mg | SUBCUTANEOUS | 0 refills | Status: DC

## 2022-11-28 MED ORDER — METFORMIN HCL 500 MG PO TABS: 500.0000 mg | ORAL_TABLET | Freq: Two times a day (BID) | ORAL | 2 refills | Status: DC

## 2022-11-28 MED ORDER — MOUNJARO 5 MG/0.5ML ~~LOC~~ SOAJ: 5.0000 mg | SUBCUTANEOUS | 1 refills | Status: DC

## 2022-11-28 NOTE — Patient Instructions (Addendum)
Recommendations: Begin Metformin oral tablet 500mg  twice daily, caution as you may have some loose stools Begin Mounjaro weekly injection 2.5mg  weekly for the first month After 1 months, increase to [facility] 5mg  weekly.  This medication can cause nausea, acid reflux or constipation As you begin these 2 medicaiton, Metformin and Mounjaro, continue 14 units of the Humalog insulin for now, but we will scale back to 10 units if you start seeing much lower readings The goal is readings 80-130 fasting.  If you start to see any numbers below 80, call so we can scale back your insulin dosing.  Continue to work on eating healthy, and I put a sample diet below      Sample eating plan:  Breakfast You may eat 1 of the following Omelette, which can include a small amount of cheese, and vegetables such as peppers, mushrooms, small pieces of [place] or chicken Low sugar yogurt serving which can include some fruit such as berries Egg whites or hard boiled egg and meat (1-2 strips of bacon, or small piece of [place] sausage or [place] bacon)    Lunch A protein source such as 1 of the following: 1 serving of beans such as black beans, pinto beans, green beans, or edamame (soy beans) 1 meat serving such as 6 oz or deck of card size serving of fish, skinless chicken, or [place], either grilled or baked preferably.   You can use some pork or beef, but limit this compared to fish, chicken or [place] Vegetable - Half of your plate should be a non-starchy vegetables!  So avoid white potatoes and corn.  Otherwise, eat a large portion of vegetables. Avocado, cucumber, tomato, carrots, greens, lettuce, squash, okra, etc.  Vegetables can include salad with olive oil/vinaigrette dressing    Dinner A protein source such as 1 of the following: 1 serving of beans such as black beans, pinto beans, green beans, or edamame (soy beans) 1 meat serving such as 6 oz or deck of card size serving of fish, skinless chicken, or  [place], either grilled or baked preferably.   You can use some pork or beef, but limit this compared to fish, chicken or [place] Vegetable - Half of your plate should be a non-starchy vegetables!  So avoid white potatoes and corn.  Otherwise, eat a large portion of vegetables. Avocado, cucumber, tomato, carrots, greens, lettuce, squash, okra, etc.  Vegetables can include salad with olive oil/vinaigrette dressing   Beverages: Water Unsweet tea Home made juice with a juicer without sugar added other than small bit of honey or agave nectar Water with sugar free flavor such as Mio   AVOID.... For the time being I want you to cut out the following items completely: Soda, sweet tea, juice, beer or wine or alcohol ALL grains and breads including rice, pasta, bread, cereal Sweets such as cake, candy, pies, chips, cookies, chocolate

## 2022-11-28 NOTE — Progress Notes (Signed)
Subjective:  Scott Navarro. is a 57 y.o. male who presents for Chief Complaint  Patient presents with   follow-up    Follow-up on Blood sugars. Has had some 250 readings. Been on Steriods since Monday due to eye procedure. He's at 14 units 3 times a day. Will do an extra dose of 10 units if his alarm will go off reading high.     Here for recheck.  I saw him as a new patient last week 11/21/2022 for uncontrolled diabetes, blood sugar over 500.  He declined emergency department evaluation and treatment.  We started mealtime insulin at that visit including a sliding scale.  Had 2 eye procedures at [facility] since last visit.  Goes back next week again for follow up, another procedure.  Using  Humalog 14u TID .   Freestyle Josephine Igo will alert him if sugar over 250.  So he will do another dose of insulin if that happens.  Has been getting alerts nightly.  He notes hx/o diabetes, diagnosed 2018.  Prior to last week he was last using medication 2018, was on medication for a while, then ran out.  Instead of following up, he worked on lifestyle changes.  Was lost to follow up.    He had emergency surgery this past week for retinal hemorrhage.  He had come in due to recommendation by the eye doctor last week.  No other aggravating or relieving factors.    No other c/o.   Past Medical History:  Diagnosis Date   Type II diabetes mellitus (HCC) dx'd 10/08/2016   Current Outpatient Medications on File Prior to Visit  Medication Sig Dispense Refill   Continuous Glucose Sensor (FREESTYLE LIBRE 3 PLUS SENSOR) MISC Change sensor every 15 days. 2 each 2   insulin lispro (HUMALOG KWIKPEN) 100 UNIT/ML KwikPen Inject 10 Units into the skin 3 (three) times daily. (Patient taking differently: Inject 14 Units into the skin 3 (three) times daily.)     No current facility-administered medications on file prior to visit.    The following portions of the patient's history were reviewed and updated as appropriate:  allergies, current medications, past family history, past medical history, past social history, past surgical history and problem list.  ROS Otherwise as in subjective above    Objective: BP 124/70   Pulse 92   Wt 198 lb (89.8 kg)   BMI 27.62 kg/m   General appearance: alert, no distress, well developed, well nourished   Assessment: Encounter Diagnoses  Name Primary?   Type 2 diabetes mellitus with hyperglycemia, unspecified whether [place] term insulin use (HCC) Yes   Retinal hemorrhage, unspecified laterality    Gait instability       Plan: I reviewed his freestyle libre numbers today.  He is consistently in the mid 200s currently but he is also getting nighttime mornings with sugars over 250.  Fortunately he is out of the 400 and 500 range.  We will add metformin and Mounjaro.  We discussed that we will probably need to start scaling back insulin as his sugars improved.  Work on [employer].  Continue follow-up with the retinal specialist for his newly diagnosed retinal hemorrhages.  Handicap placard form today given his gait issues due to the vision problems.  Advised to use the cane that he has at home  Recheck in 4 to 6 weeks   Patient Instructions  Recommendations: Begin Metformin oral tablet 500mg  twice daily, caution as you may have some loose stools Begin  Mounjaro weekly injection 2.5mg  weekly for the first month After 1 months, increase to [facility] 5mg  weekly.  This medication can cause nausea, acid reflux or constipation As you begin these 2 medicaiton, Metformin and Mounjaro, continue 14 units of the Humalog insulin for now, but we will scale back to 10 units if you start seeing much lower readings The goal is readings 80-130 fasting.  If you start to see any numbers below 80, call so we can scale back your insulin dosing.  Continue to work on eating healthy, and I put a sample diet below      Sample eating plan:  Breakfast You may eat 1 of the  following Omelette, which can include a small amount of cheese, and vegetables such as peppers, mushrooms, small pieces of [place] or chicken Low sugar yogurt serving which can include some fruit such as berries Egg whites or hard boiled egg and meat (1-2 strips of bacon, or small piece of [place] sausage or [place] bacon)    Lunch A protein source such as 1 of the following: 1 serving of beans such as black beans, pinto beans, green beans, or edamame (soy beans) 1 meat serving such as 6 oz or deck of card size serving of fish, skinless chicken, or [place], either grilled or baked preferably.   You can use some pork or beef, but limit this compared to fish, chicken or [place] Vegetable - Half of your plate should be a non-starchy vegetables!  So avoid white potatoes and corn.  Otherwise, eat a large portion of vegetables. Avocado, cucumber, tomato, carrots, greens, lettuce, squash, okra, etc.  Vegetables can include salad with olive oil/vinaigrette dressing    Dinner A protein source such as 1 of the following: 1 serving of beans such as black beans, pinto beans, green beans, or edamame (soy beans) 1 meat serving such as 6 oz or deck of card size serving of fish, skinless chicken, or [place], either grilled or baked preferably.   You can use some pork or beef, but limit this compared to fish, chicken or [place] Vegetable - Half of your plate should be a non-starchy vegetables!  So avoid white potatoes and corn.  Otherwise, eat a large portion of vegetables. Avocado, cucumber, tomato, carrots, greens, lettuce, squash, okra, etc.  Vegetables can include salad with olive oil/vinaigrette dressing   Beverages: Water Unsweet tea Home made juice with a juicer without sugar added other than small bit of honey or agave nectar Water with sugar free flavor such as Mio   AVOID.... For the time being I want you to cut out the following items completely: Soda, sweet tea, juice, beer or wine or  alcohol ALL grains and breads including rice, pasta, bread, cereal Sweets such as cake, candy, pies, chips, cookies, chocolate    Scott "[name]" was seen today for follow-up.  Diagnoses and all orders for this visit:  Type 2 diabetes mellitus with hyperglycemia, unspecified whether [place] term insulin use (HCC)  Retinal hemorrhage, unspecified laterality  Gait instability  Other orders -     metFORMIN (GLUCOPHAGE) 500 MG tablet; Take 1 tablet (500 mg total) by mouth 2 (two) times daily with a meal. -     tirzepatide (MOUNJARO) 2.5 MG/0.5ML Pen; Inject 2.5 mg into the skin once a week. -     tirzepatide [facility]) 5 MG/0.5ML Pen; Inject 5 mg into the skin once a week.    Follow up: 4-6 weeks

## 2022-12-30 ENCOUNTER — Ambulatory Visit: Payer: 59 | Admitting: Medical

## 2022-12-30 ENCOUNTER — Encounter: Payer: Self-pay | Admitting: Medical

## 2022-12-30 VITALS — BP 120/80 | HR 113 | Wt 193.8 lb

## 2022-12-30 DIAGNOSIS — L97528 Non-pressure chronic ulcer of other part of left foot with other specified severity: Secondary | ICD-10-CM

## 2022-12-30 DIAGNOSIS — E1142 Type 2 diabetes mellitus with diabetic polyneuropathy: Secondary | ICD-10-CM | POA: Diagnosis not present

## 2022-12-30 DIAGNOSIS — E1165 Type 2 diabetes mellitus with hyperglycemia: Secondary | ICD-10-CM | POA: Diagnosis not present

## 2022-12-30 DIAGNOSIS — R2242 Localized swelling, mass and lump, left lower limb: Secondary | ICD-10-CM | POA: Diagnosis not present

## 2022-12-30 MED ORDER — TIRZEPATIDE 10 MG/0.5ML ~~LOC~~ SOAJ
10.0000 mg | SUBCUTANEOUS | 0 refills | Status: DC
Start: 2022-12-30 — End: 2023-02-18

## 2022-12-30 MED ORDER — TIRZEPATIDE 7.5 MG/0.5ML ~~LOC~~ SOAJ: 7.5000 mg | SUBCUTANEOUS | 0 refills | Status: DC

## 2022-12-30 MED ORDER — INSULIN LISPRO (1 UNIT DIAL) 100 UNIT/ML (KWIKPEN): 8.0000 [IU] | PEN_INJECTOR | Freq: Three times a day (TID) | SUBCUTANEOUS | Status: DC

## 2022-12-30 NOTE — Progress Notes (Signed)
Subjective:  Scott Navarro. is a 57 y.o. male who presents for Chief Complaint  Patient presents with   Diabetes    Diabetes. On mounjaro 5mg  as of Friday. BS dropped 55 Saturday afternoon. Had taken 2 shots of insulin that day. Had juice and chocolate and then yesterday after lunch it dropped to 60. But it has been staying in the green. Hasn't taken any insulin today and has eaten twice today.      Here for diabetes follow-up.  First visit here November 21, 2022 when he came in with blood sugars around 500, diabetes uncontrolled.  He has worked with medications and diet changes to get sugars under control  Currently on fast acting insulin 14 units 3 times a day with meals and Mounjaro 5 mg weekly.  He did started his first dose of 5 mg Mounjaro last week.  He did a 2.5 mg weekly for the first month.  Things have been steadily getting better including blood sugar readings gradually getting better and better in the 150 -200 range however over the weekend he had a blood sugar dropped down to 55 for the first time.  No other low readings up to this point.  He has had diabetes for years but has not been on medicine in several years until he came in September to our clinic  We initially had him on metformin a few weeks ago but he could not tolerate this after lots of diarrhea even after 1 to 2 weeks of use at the low dose 500 mg twice daily  He does eat 3 times a day   He does have a foot issue to look at on the left foot.  He notes that he has had a nodule and a growth on the bottom of his left foot that at times will swell up at times will be back down to flat and pink.  Right now it is aggravated.  He has had this for several years  He does have a lot going on between his health issues and other family members.  His mother has dementia and has been living with him for 6 years.  He is married.  His 43 year old son had a dissected aorta earlier this year and had 2 strokes while in surgery.   Currently is nonverbal and Scott Navarro and his wife take care of their own health issues as well as mom and son.  No other aggravating or relieving factors.    No other c/o.  Past Medical History:  Diagnosis Date   Type II diabetes mellitus (HCC) dx'd 10/08/2016   Current Outpatient Medications on File Prior to Visit  Medication Sig Dispense Refill   Continuous Glucose Sensor (FREESTYLE LIBRE 3 PLUS SENSOR) MISC Change sensor every 15 days. 2 each 2   No current facility-administered medications on file prior to visit.     The following portions of the patient's history were reviewed and updated as appropriate: allergies, current medications, past family history, past medical history, past social history, past surgical history and problem list.  ROS Otherwise as in subjective above    Objective: BP 120/80   Pulse (!) 113   Wt 193 lb 12.8 oz (87.9 kg)   BMI 27.03 kg/m   Wt Readings from Last 3 Encounters:  12/30/22 193 lb 12.8 oz (87.9 kg)  11/28/22 198 lb (89.8 kg)  11/21/22 199 lb 12.8 oz (90.6 kg)   BP Readings from Last 3 Encounters:  12/30/22 120/80  11/28/22  124/70  11/21/22 122/80     General appearance: alert, no distress, well developed, well nourished Pulses: 2+ radial pulses, 2+ pedal pulses, normal cap refill Ext: no edema Hypertrophic nails both feet noted with yellowish coloration There is a large 6 cm area of raised tissue on the bottom of the left foot with a central ulceration as in the picture above, slight odor but no drainage    Assessment: Encounter Diagnoses  Name Primary?   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  However he had a hypoglycemic episode Sunday.  We want to avoid hypoglycemia  Referral to podiatry for longstanding foot issue that is abnormal.  He is very reluctant to have surgery and has a lot of other social and family issues going on right now creating stress   Recommendations: Doreatha Martin out the 5 mg Mounjaro over the next 3 weeks Then increase to Mounjaro 7.5 mg weekly for 1 month Then increase to Mounjaro 10 mg weekly You have already discontinued metformin due to diarrhea Change your Humalog mealtime insulin to 8 units three times daily Continue to eat healthy low sugar diet The goal is to stay between 80 and 130 blood sugar fasting in the morning or before meals Despite these changes today if you keep getting sugar readings that are dipping too low close to 60 or less, then we will need to lower the insulin units even more For example after a couple weeks if you start seeing blood sugars drop down too low then you would go to Humalog 5 units 3 times a day instead of 8 units 3 times a day. Or you can call back and give me an update on numbers the next few weeks in case we need to go ahead and make other changes with the mealtime insulin   Scott Navarro "Scott Navarro" was seen today for diabetes.  Diagnoses and all orders for this visit:  Foot mass, left -     Ambulatory referral to Podiatry  Ulcer of left foot with other severity (HCC) -     Ambulatory referral to Podiatry  Type 2 diabetes mellitus with hyperglycemia, unspecified whether Fant term insulin use (HCC)  Diabetic polyneuropathy associated with type 2 diabetes mellitus (HCC)  Other orders -     tirzepatide (MOUNJARO) 7.5 MG/0.5ML Pen; Inject 7.5 mg into the skin once a week. -     tirzepatide (MOUNJARO) 10 MG/0.5ML Pen; Inject 10 mg into the skin once a week. -     insulin lispro (HUMALOG KWIKPEN) 100 UNIT/ML KwikPen; Inject 8 Units into the skin 3 (three) times daily.    Follow up: 53mo

## 2022-12-30 NOTE — Patient Instructions (Signed)
  Recommendations: Doreatha Martin out the 5 mg Mounjaro over the next 3 weeks Then increase to Mounjaro 7.5 mg weekly for 1 month Then increase to Mounjaro 10 mg weekly You have already discontinued metformin due to diarrhea Change your Humalog mealtime insulin to 8 units three times daily Continue to eat healthy low sugar diet The goal is to stay between 80 and 130 blood sugar fasting in the morning or before meals Despite these changes today if you keep getting sugar readings that are dipping too low close to 60 or less, then we will need to lower the insulin units even more For example after a couple weeks if you start seeing blood sugars drop down too low then you would go to Humalog 5 units 3 times a day instead of 8 units 3 times a day. Or you can call back and give me an update on numbers the next few weeks in case we need to go ahead and make other changes with the mealtime insulin

## 2023-01-03 ENCOUNTER — Ambulatory Visit (INDEPENDENT_AMBULATORY_CARE_PROVIDER_SITE_OTHER): Payer: 59

## 2023-01-03 ENCOUNTER — Ambulatory Visit: Payer: 59 | Admitting: Podiatry

## 2023-01-03 ENCOUNTER — Encounter: Payer: Self-pay | Admitting: Podiatry

## 2023-01-03 DIAGNOSIS — M778 Other enthesopathies, not elsewhere classified: Secondary | ICD-10-CM | POA: Diagnosis not present

## 2023-01-03 DIAGNOSIS — M7989 Other specified soft tissue disorders: Secondary | ICD-10-CM | POA: Diagnosis not present

## 2023-01-03 DIAGNOSIS — E11610 Type 2 diabetes mellitus with diabetic neuropathic arthropathy: Secondary | ICD-10-CM | POA: Diagnosis not present

## 2023-01-03 NOTE — Progress Notes (Signed)
  Subjective:  Patient ID: Scott Navarro., male    DOB: 20-Feb-1966,  MRN: [id number]  Chief Complaint  Patient presents with   Foot Ulcer    Left foot ulcer recurrent heals and comes back this time felt like a marble and now it has a hole some drainage.    57 y.o. male presents with the above complaint.  Patient presents with left foot ulceration recurring.  Patient states that it felt like a marble and now has a hole in it.  Wanted get it evaluated.  He has not seen MRIs prior to seeing me he is a diabetic.  He does not recall having Charcot deformity.  Pain scale 5 out of 10 dull achy in nature   Review of Systems: Negative except as noted in the HPI. Denies N/V/F/Ch.  Past Medical History:  Diagnosis Date   Type II diabetes mellitus (HCC) dx'd 10/08/2016    Current Outpatient Medications:    insulin lispro (HUMALOG KWIKPEN) 100 UNIT/ML KwikPen, Inject 8 Units into the skin 3 (three) times daily., Disp: , Rfl:    tirzepatide (MOUNJARO) 10 MG/0.5ML Pen, Inject 10 mg into the skin once a week., Disp: 6 mL, Rfl: 0   tirzepatide (MOUNJARO) 7.5 MG/0.5ML Pen, Inject 7.5 mg into the skin once a week., Disp: 6 mL, Rfl: 0   Continuous Glucose Sensor (FREESTYLE LIBRE 3 PLUS SENSOR) MISC, Change sensor every 15 days., Disp: 6 each, Rfl: 1  Social History   Tobacco Use  Smoking Status Never  Smokeless Tobacco Never    Allergies  Allergen Reactions   Metformin And Related     diarrhea   Objective:  There were no vitals filed for this visit. There is no height or weight on file to calculate BMI. Constitutional Well developed. Well nourished.  Vascular Dorsalis pedis pulses palpable bilaterally. Posterior tibial pulses palpable bilaterally. Capillary refill normal to all digits.  No cyanosis or clubbing noted. Pedal hair growth normal.  Neurologic Normal speech. Oriented to person, place, and time. Epicritic sensation to light touch grossly present bilaterally.  Dermatologic  Nails well groomed and normal in appearance. No open wounds. No skin lesions.  Orthopedic:    Radiographs: 3 views of skeletally mature adult left foot: X-rays were not uploaded.  However in the system is reviewed patient does have Charcot deformity with breakdown and acute flare of Assessment:   1. Capsulitis of foot   2. Charcot foot due to diabetes mellitus (HCC)   3. Soft tissue mass    Plan:  Patient was evaluated and treated and all questions answered.  Left foot Charcot deformity with underlying plantar midfoot ulcer/soft tissue mass -All questions and concerns were discussed with the patient extensive detail -I will plan on getting an MRI to assess the deformity as well as a soft tissue mass -Cam boot was dispensed patient will continue wearing the cam boot -For the soft tissue ulceration encouraged him to do Betadine wet-to-dry dressing  No follow-ups on file.   Left foot soft issue massplantar midfoot  vs charcot xray  Cam boot

## 2023-01-06 ENCOUNTER — Telehealth: Payer: Self-pay | Admitting: Medical

## 2023-01-06 DIAGNOSIS — E133593 Other specified diabetes mellitus with proliferative diabetic retinopathy without macular edema, bilateral: Secondary | ICD-10-CM

## 2023-01-06 LAB — HM DIABETES EYE EXAM

## 2023-01-06 NOTE — Telephone Encounter (Signed)
Pt left message needs PA for Freestyle 3+ sensor, said call [phone]

## 2023-01-07 MED ORDER — FREESTYLE LIBRE 3 PLUS SENSOR MISC: 1 refills | Status: DC

## 2023-01-07 MED ORDER — FREESTYLE LIBRE 3 PLUS SENSOR MISC: 2 refills | Status: DC

## 2023-01-07 NOTE — Telephone Encounter (Signed)
Pt called again about Freestyle Libre sensors  He called insurance and they told him to have [place] send a written rx for the senors

## 2023-01-07 NOTE — Telephone Encounter (Signed)
Pt would like you to call him about the PA on his libre please, He says he has been waiting for 30 days?

## 2023-01-07 NOTE — Telephone Encounter (Signed)
Pt called and he spoke to insurance company about [place] sensors  they told him that it is covered under his medical and for [place] to fax rx to [facility]  (phone) [phone]         Fax  [phone] And they would send out to him at no cost

## 2023-01-07 NOTE — Telephone Encounter (Signed)
I have sent libre 3 plus sensors electronically to [facility] with 1 refill ( 6 month supply)

## 2023-01-10 ENCOUNTER — Other Ambulatory Visit: Payer: Self-pay | Admitting: Podiatry

## 2023-01-10 DIAGNOSIS — M7989 Other specified soft tissue disorders: Secondary | ICD-10-CM

## 2023-01-10 DIAGNOSIS — E11610 Type 2 diabetes mellitus with diabetic neuropathic arthropathy: Secondary | ICD-10-CM

## 2023-01-28 ENCOUNTER — Inpatient Hospital Stay
Admission: RE | Admit: 2023-01-28 | Discharge: 2023-01-28 | Disposition: A | Discharge status: Home / Self Care | Payer: 59 | Source: Ambulatory Visit | Attending: Podiatry

## 2023-01-28 ENCOUNTER — Ambulatory Visit
Admission: RE | Admit: 2023-01-28 | Discharge: 2023-01-28 | Disposition: A | Discharge status: Home / Self Care | Payer: 59 | Source: Ambulatory Visit | Attending: Podiatry

## 2023-01-28 DIAGNOSIS — E11610 Type 2 diabetes mellitus with diabetic neuropathic arthropathy: Secondary | ICD-10-CM

## 2023-01-28 DIAGNOSIS — M7989 Other specified soft tissue disorders: Secondary | ICD-10-CM

## 2023-02-03 ENCOUNTER — Telehealth: Payer: Self-pay | Admitting: Medical

## 2023-02-03 MED ORDER — INSULIN LISPRO (1 UNIT DIAL) 100 UNIT/ML (KWIKPEN): 8.0000 [IU] | PEN_INJECTOR | Freq: Three times a day (TID) | SUBCUTANEOUS | 0 refills | Status: DC

## 2023-02-03 NOTE — Telephone Encounter (Signed)
Pt left voicemail  needs refill on [employer]

## 2023-02-03 NOTE — Telephone Encounter (Signed)
Done

## 2023-02-05 ENCOUNTER — Ambulatory Visit: Payer: 59 | Admitting: Podiatry

## 2023-02-05 ENCOUNTER — Encounter: Payer: Self-pay | Admitting: Podiatry

## 2023-02-05 DIAGNOSIS — E11610 Type 2 diabetes mellitus with diabetic neuropathic arthropathy: Secondary | ICD-10-CM | POA: Diagnosis not present

## 2023-02-05 DIAGNOSIS — M7989 Other specified soft tissue disorders: Secondary | ICD-10-CM | POA: Diagnosis not present

## 2023-02-05 NOTE — Progress Notes (Signed)
Subjective:  Patient ID: Scott Navarro., male    DOB: 24-Sep-1965,  MRN: [id number]  Chief Complaint  Patient presents with   Routine Post Op    PATIENT STATES THAT HE HAS BEEN DOING GOOD, PATIENT STATES IT SEEMS TO BE HEALING UP PRETTY FAST. NO MEDICATION FOR PAIN.    57 y.o. male presents with the above complaint.  Patient Scott Navarro for follow-up of left Charcot foot and deformity he states that the ulcer is about the same the pain is little bit better denies any other acute complaints would like to discuss next treatment plan   Review of Systems: Negative except as noted in the HPI. Denies N/V/F/Ch.  Past Medical History:  Diagnosis Date   Type II diabetes mellitus (HCC) dx'd 10/08/2016    Current Outpatient Medications:    Continuous Glucose Sensor (FREESTYLE LIBRE 3 PLUS SENSOR) MISC, Change sensor every 15 days., Disp: 6 each, Rfl: 1   insulin lispro (HUMALOG KWIKPEN) 100 UNIT/ML KwikPen, Inject 8 Units into the skin 3 (three) times daily., Disp: 15 mL, Rfl: 0   tirzepatide (MOUNJARO) 10 MG/0.5ML Pen, Inject 10 mg into the skin once a week., Disp: 6 mL, Rfl: 0   tirzepatide (MOUNJARO) 7.5 MG/0.5ML Pen, Inject 7.5 mg into the skin once a week., Disp: 6 mL, Rfl: 0  Social History   Tobacco Use  Smoking Status Never  Smokeless Tobacco Never    Allergies  Allergen Reactions   Metformin And Related     diarrhea   Objective:  There were no vitals filed for this visit. There is no height or weight on file to calculate BMI. Constitutional Well developed. Well nourished.  Vascular Dorsalis pedis pulses palpable bilaterally. Posterior tibial pulses palpable bilaterally. Capillary refill normal to all digits.  No cyanosis or clubbing noted. Pedal hair growth normal.  Neurologic Normal speech. Oriented to person, place, and time. Epicritic sensation to light touch grossly present bilaterally.  Dermatologic Nails well groomed and normal in appearance. No open wounds. No  skin lesions.  Orthopedic:    Radiographs: 3 views of skeletally mature adult left foot: X-rays were not uploaded.  However in the system is reviewed patient does have Charcot deformity with breakdown and acute flare of  IMPRESSION: 1. 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 There is some fragmentation of the navicular and flattening of the navicular laterally. Below the abnormal focal protuberant inferior cuboid, there is focal adventitial bursitis. 2. Severe degenerative arthropathy and some short for arthropathy at the articulation of the cuboid with the bases of the fourth and fifth metatarsal. 3. Thickening of the medial band of the plantar fascia compatible with plantar fasciitis. 4. Attenuation and possibly tear of the medioplantar oblique and inferoplantar longitudinal components of the spring ligament. 5. Non-fragmented osteochondral lesions posteromedially along the talar dome and adjacent tibial plafond. Assessment:   1. Charcot foot due to diabetes mellitus (HCC)   2. Soft tissue mass     Plan:  Patient was evaluated and treated and all questions answered.  Left foot Charcot deformity with underlying plantar midfoot ulcer/soft tissue mass -All questions and concerns were discussed with the patient extensive detail -MRI was reviewed with the patient in extensive detail.  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  I discussed briefly with the patient surgical with surgical resection and removal of adventitious bursa he would like to  think about the surgery will get back to me -Cam boot was dispensed patient will continue wearing the cam boot -Continue cam boot and Betadine dressings

## 2023-02-18 ENCOUNTER — Ambulatory Visit: Payer: 59 | Admitting: Medical

## 2023-02-18 VITALS — BP 122/80 | HR 107 | Wt 187.0 lb

## 2023-02-18 DIAGNOSIS — E1165 Type 2 diabetes mellitus with hyperglycemia: Secondary | ICD-10-CM | POA: Diagnosis not present

## 2023-02-18 DIAGNOSIS — Z125 Encounter for screening for malignant neoplasm of prostate: Secondary | ICD-10-CM | POA: Diagnosis not present

## 2023-02-18 DIAGNOSIS — M14672 Charcot's joint, left ankle and foot: Secondary | ICD-10-CM

## 2023-02-18 DIAGNOSIS — E11319 Type 2 diabetes mellitus with unspecified diabetic retinopathy without macular edema: Secondary | ICD-10-CM | POA: Insufficient documentation

## 2023-02-18 DIAGNOSIS — Z136 Encounter for screening for cardiovascular disorders: Secondary | ICD-10-CM

## 2023-02-18 DIAGNOSIS — Z13220 Encounter for screening for lipoid disorders: Secondary | ICD-10-CM

## 2023-02-18 DIAGNOSIS — Z1329 Encounter for screening for other suspected endocrine disorder: Secondary | ICD-10-CM | POA: Diagnosis not present

## 2023-02-18 MED ORDER — TIRZEPATIDE 10 MG/0.5ML ~~LOC~~ SOAJ: 10.0000 mg | SUBCUTANEOUS | 2 refills | Status: DC

## 2023-02-18 NOTE — Progress Notes (Signed)
Subjective:  Scott Navarro. is a 57 y.o. male who presents for Chief Complaint  Patient presents with   Medical Management of Chronic Issues    Med check-  BS 100-160 fasting. Doesn't take shot unless BS is over 260. Has insulin in case but mounjaro is doing well keeping his BS in normal range. Has not had any low blood sugars in over a week.     Here for med check.  Diabetes-currently on Mounjaro 7.5 mg weekly.  Still uses Humalog insulin only if postprandial sugars after 2 hours are over 260.  Will use 8 units if that is the case.  Not having used insulin very much.  So using the freestyle libre glucometer device  He has been seeing podiatry for Charcot foot.  In a boot and contemplating surgery  Seeing eye doctor for diabetic retinopathy  Freestyle libre device shows 177, 7-day average, 195, 14 day average close to 200, 90-day average.  Eats 3 times a day, does not always eat a full meal due to less appetite with the Mounjaro  No other aggravating or relieving factors.    No other c/o.  Past Medical History:  Diagnosis Date   Type II diabetes mellitus (HCC) dx'd 10/08/2016   Current Outpatient Medications on File Prior to Visit  Medication Sig Dispense Refill   Continuous Glucose Sensor (FREESTYLE LIBRE 3 PLUS SENSOR) MISC Change sensor every 15 days. 6 each 1   insulin lispro (HUMALOG KWIKPEN) 100 UNIT/ML KwikPen Inject 8 Units into the skin 3 (three) times daily. 15 mL 0   tirzepatide (MOUNJARO) 7.5 MG/0.5ML Pen Inject 7.5 mg into the skin once a week. 6 mL 0   tirzepatide (MOUNJARO) 10 MG/0.5ML Pen Inject 10 mg into the skin once a week. (Patient not taking: Reported on 02/18/2023) 6 mL 0   No current facility-administered medications on file prior to visit.     The following portions of the patient's history were reviewed and updated as appropriate: allergies, current medications, past family history, past medical history, past social history, past surgical history and  problem list.  ROS Otherwise as in subjective above  Objective: BP 122/80   Pulse (!) 107   Wt 187 lb (84.8 kg)   BMI 26.08 kg/m   General appearance: alert, no distress, well developed, well nourished Neck: supple, no lymphadenopathy, no thyromegaly, no masses Heart: RRR, normal S1, S2, no murmurs Lungs: CTA bilaterally, no wheezes, rhonchi, or rales Pulses: 2+ radial pulses, 2+ pedal pulses, normal cap refill Ext: no edema  Diabetic Foot Exam - Simple   Simple Foot Form Diabetic Foot exam was performed with the following findings: Yes 02/18/2023  4:37 PM  Visual Inspection See comments: Yes Sensation Testing Intact to touch and monofilament testing bilaterally: Yes Pulse Check Posterior Tibialis and Dorsalis pulse intact bilaterally: Yes Comments Hypertrophic nails both feet noted with yellowish coloration There is a large 6 cm area of raised tissue on the bottom of the left foot        Assessment: Encounter Diagnoses  Name Primary?   Type 2 diabetes mellitus with hyperglycemia, unspecified whether Scott Navarro term insulin use (HCC) Yes   Screening for prostate cancer    Encounter for lipid screening for cardiovascular disease    Screening for thyroid disorder    Charcot joint of left foot    Diabetic retinopathy associated with type 2 diabetes mellitus, macular edema presence unspecified, unspecified laterality, unspecified retinopathy severity (HCC)      Plan:  Diabetes Back in September 2024 had blood sugars close to 500.  He has done a great job of diet changes and medication and getting numbers under control Continue CGM with freestyle libre Increase to the [employer] 10 mg weekly Only use Humalog now sparingly if postprandial glucose is high over 250 Avoid hypoglycemia as discussed  Baseline PSA for prostate cancer screening today  Charcot's foot-seeing podiatry, currently in a boot/splint  Diabetic retinopathy-follow-up with ophthalmology and has  plan  Return next week for fasting labs  Scott Navarro was seen today for medical management of chronic issues.  Diagnoses and all orders for this visit:  Type 2 diabetes mellitus with hyperglycemia, unspecified whether Scott Navarro term insulin use (HCC) -     Hemoglobin A1c; Future -     Comprehensive metabolic panel; Future  Screening for prostate cancer -     PSA; Future  Encounter for lipid screening for cardiovascular disease -     Lipid panel; Future  Screening for thyroid disorder -     TSH; Future  Charcot joint of left foot  Diabetic retinopathy associated with type 2 diabetes mellitus, macular edema presence unspecified, unspecified laterality, unspecified retinopathy severity (HCC)  Other orders -     tirzepatide (MOUNJARO) 10 MG/0.5ML Pen; Inject 10 mg into the skin once a week.    Follow up: next week for fasting labs

## 2023-02-19 ENCOUNTER — Encounter: Payer: Self-pay | Admitting: Internal Medicine

## 2023-02-19 NOTE — Progress Notes (Signed)
documented

## 2023-02-24 ENCOUNTER — Other Ambulatory Visit: Payer: 59

## 2023-02-24 DIAGNOSIS — Z125 Encounter for screening for malignant neoplasm of prostate: Secondary | ICD-10-CM

## 2023-02-24 DIAGNOSIS — E1165 Type 2 diabetes mellitus with hyperglycemia: Secondary | ICD-10-CM

## 2023-02-24 DIAGNOSIS — Z13220 Encounter for screening for lipoid disorders: Secondary | ICD-10-CM

## 2023-02-24 DIAGNOSIS — Z1329 Encounter for screening for other suspected endocrine disorder: Secondary | ICD-10-CM

## 2023-02-24 LAB — LIPID PANEL

## 2023-02-25 ENCOUNTER — Other Ambulatory Visit: Payer: Self-pay | Admitting: Medical

## 2023-02-25 LAB — COMPREHENSIVE METABOLIC PANEL
ALT: 13 IU/L (ref 0–44)
AST: 13 IU/L (ref 0–40)
Albumin: 3.7 g/dL — ABNORMAL LOW (ref 3.8–4.9)
Alkaline Phosphatase: 71 IU/L (ref 44–121)
BUN/Creatinine Ratio: 21 — ABNORMAL HIGH (ref 9–20)
BUN: 26 mg/dL — ABNORMAL HIGH (ref 6–24)
Bilirubin Total: 0.3 mg/dL (ref 0.0–1.2)
CO2: 24 mmol/L (ref 20–29)
Calcium: 10.1 mg/dL (ref 8.7–10.2)
Chloride: 101 mmol/L (ref 96–106)
Creatinine, Ser: 1.26 mg/dL (ref 0.76–1.27)
Globulin, Total: 3.2 g/dL (ref 1.5–4.5)
Glucose: 161 mg/dL — ABNORMAL HIGH (ref 70–99)
Potassium: 4.7 mmol/L (ref 3.5–5.2)
Sodium: 139 mmol/L (ref 134–144)
Total Protein: 6.9 g/dL (ref 6.0–8.5)
eGFR: 66 mL/min/{1.73_m2} (ref >59)

## 2023-02-25 LAB — PSA: Prostate Specific Ag, Serum: 0.5 ng/mL (ref 0.0–4.0)

## 2023-02-25 LAB — LIPID PANEL
Cholesterol, Total: 178 mg/dL (ref 100–199)
HDL: 42 mg/dL (ref >39)
LDL CALC COMMENT:: 4.2 ratio (ref 0.0–5.0)
LDL Chol Calc (NIH): 113 mg/dL — ABNORMAL HIGH (ref 0–99)
Triglycerides: 129 mg/dL (ref 0–149)
VLDL Cholesterol Cal: 23 mg/dL (ref 5–40)

## 2023-02-25 LAB — HEMOGLOBIN A1C
Est. average glucose Bld gHb Est-mCnc: 192 mg/dL
Hgb A1c MFr Bld: 8.3 % — ABNORMAL HIGH (ref 4.8–5.6)

## 2023-02-25 LAB — TSH: TSH: 2.52 u[IU]/mL (ref 0.450–4.500)

## 2023-02-25 MED ORDER — ROSUVASTATIN CALCIUM 20 MG PO TABS: 20.0000 mg | ORAL_TABLET | Freq: Every day | ORAL | 2 refills | Status: DC

## 2023-02-25 MED ORDER — MOUNJARO 12.5 MG/0.5ML ~~LOC~~ SOAJ: 12.5000 mg | SUBCUTANEOUS | 1 refills | Status: DC

## 2023-02-25 NOTE — Progress Notes (Signed)
Labs show normal liver test, electrolytes okay.  LDL cholesterol too high at 113.  Like to see the LDL under 50 for heart disease prevention measures.  Thyroid normal.  Hemoglobin A1c 8.3% improved from the prior almost 13%.  Still pending prostate marker.  I recommend starting Crestor daily at bedtime to lower risk of heart disease and stroke.  Agreeable I will send this to the pharmacy.  Continue current medicines but before you run out of 10 mg Mounjaro let me know so we can increase to the 12.5

## 2023-02-25 NOTE — Progress Notes (Signed)
Results sent through MyChart

## 2023-03-14 ENCOUNTER — Ambulatory Visit: Payer: 59 | Admitting: Podiatry

## 2023-04-04 ENCOUNTER — Ambulatory Visit: Payer: 59 | Admitting: Podiatry

## 2023-05-08 ENCOUNTER — Other Ambulatory Visit: Payer: Self-pay | Admitting: Medical

## 2023-05-08 MED ORDER — MOUNJARO 15 MG/0.5ML ~~LOC~~ SOAJ: 15.0000 mg | SUBCUTANEOUS | 0 refills | Status: DC

## 2023-05-08 NOTE — Telephone Encounter (Signed)
Please schedule patient for a visit

## 2023-05-15 ENCOUNTER — Ambulatory Visit: Admitting: Medical

## 2023-05-15 ENCOUNTER — Encounter: Payer: Self-pay | Admitting: Medical

## 2023-05-15 VITALS — BP 130/82 | HR 106 | Wt 179.8 lb

## 2023-05-15 DIAGNOSIS — R030 Elevated blood-pressure reading, without diagnosis of hypertension: Secondary | ICD-10-CM | POA: Diagnosis not present

## 2023-05-15 DIAGNOSIS — E1142 Type 2 diabetes mellitus with diabetic polyneuropathy: Secondary | ICD-10-CM

## 2023-05-15 DIAGNOSIS — E11319 Type 2 diabetes mellitus with unspecified diabetic retinopathy without macular edema: Secondary | ICD-10-CM

## 2023-05-15 DIAGNOSIS — E1165 Type 2 diabetes mellitus with hyperglycemia: Secondary | ICD-10-CM | POA: Diagnosis not present

## 2023-05-15 DIAGNOSIS — H409 Unspecified glaucoma: Secondary | ICD-10-CM

## 2023-05-15 DIAGNOSIS — E785 Hyperlipidemia, unspecified: Secondary | ICD-10-CM

## 2023-05-15 DIAGNOSIS — M14672 Charcot's joint, left ankle and foot: Secondary | ICD-10-CM | POA: Diagnosis not present

## 2023-05-15 DIAGNOSIS — R809 Proteinuria, unspecified: Secondary | ICD-10-CM

## 2023-05-15 MED ORDER — MOUNJARO 15 MG/0.5ML ~~LOC~~ SOAJ: 15.0000 mg | SUBCUTANEOUS | 5 refills | Status: DC

## 2023-05-15 MED ORDER — VALSARTAN 40 MG PO TABS: 40.0000 mg | ORAL_TABLET | Freq: Every day | ORAL | 3 refills | Status: DC

## 2023-05-15 NOTE — Progress Notes (Signed)
 Subjective:  Scott Navarro. is a 58 y.o. male who presents for Chief Complaint  Patient presents with   other    Needs refill on mounjaro, working well not needing insulin,      Here for med check.  Diabetes-compliant with Mounjaro 15 mg weekly.  He has been on this about a month.  Sugars have been looking really good.  He is using the freestyle libre.  Average 7 day blood sugars are in the 138 range, but 90-day range about 154.  He is continuously really good numbers over the last few month.  He is having no particular side effects or problems with the [facility].  Hyperlipidemia-he continues on Crestor without any complaint.  He continues to see the eye specialist at [facility].  He does have a new diagnosis of cataract which they will address later on.  Right now things are under control with this retinopathy as well  He does check blood pressures at home.  He runs around 130/80 at home.  No other aggravating or relieving factors.    No other c/o.  Past Medical History:  Diagnosis Date   Type II diabetes mellitus (HCC) dx'd 10/08/2016   Current Outpatient Medications on File Prior to Visit  Medication Sig Dispense Refill   Continuous Glucose Sensor (FREESTYLE LIBRE 3 PLUS SENSOR) MISC Change sensor every 15 days. 6 each 1   rosuvastatin (CRESTOR) 20 MG tablet Take 1 tablet (20 mg total) by mouth daily. 90 tablet 2   insulin lispro (HUMALOG KWIKPEN) 100 UNIT/ML KwikPen Inject 8 Units into the skin 3 (three) times daily. (Patient not taking: Reported on 05/15/2023) 15 mL 0   No current facility-administered medications on file prior to visit.    The following portions of the patient's history were reviewed and updated as appropriate: allergies, current medications, past family history, past medical history, past social history, past surgical history and problem list.  ROS Otherwise as in subjective above    Objective: BP 130/82   Pulse (!) 106   Wt 179 lb 12.8 oz (81.6 kg)    BMI 25.08 kg/m   BP Readings from Last 3 Encounters:  05/15/23 130/82  02/18/23 122/80  12/30/22 120/80   Wt Readings from Last 3 Encounters:  05/15/23 179 lb 12.8 oz (81.6 kg)  02/18/23 187 lb (84.8 kg)  12/30/22 193 lb 12.8 oz (87.9 kg)   General appearance: alert, no distress, well developed, well nourished Neck: supple, no lymphadenopathy, no thyromegaly, no masses Heart: RRR, normal S1, S2, no murmurs Lungs: CTA bilaterally, no wheezes, rhonchi, or rales Pulses: 2+ radial pulses, 2+ pedal pulses, normal cap refill Ext: no edema    Assessment: Encounter Diagnoses  Name Primary?   Type 2 diabetes mellitus with hyperglycemia, unspecified whether Scott Navarro term insulin use (HCC) Yes   Charcot joint of left foot    Diabetic polyneuropathy associated with type 2 diabetes mellitus (HCC)    Elevated blood pressure reading    Diabetic retinopathy associated with type 2 diabetes mellitus, macular edema presence unspecified, unspecified laterality, unspecified retinopathy severity (HCC)    Glaucoma, unspecified glaucoma type, unspecified laterality    Hyperlipidemia, unspecified hyperlipidemia type    Microalbuminuria      Plan: Diabetes Continue Mounjaro 15 mg weekly Continue using freestyle libre device Begin ARB due to microalbuminuria He has done a great job getting his sugars under control over the last several months. Continue healthy diet and exercise  Diabetic retinopathy-continue seeing your eye specialist at  [facility] and her local eye doctor  Elevated blood pressure-he is running in the 130s/80s..  We will initiate valsartan given the microalbuminuria  Microalbuminuria-given the abnormal microalbumin blood test in September 2024 we will initiate valsartan  blood pressure pill to protect the kidney  Hyperlipidemia-continue statin Crestor 20 mg daily   Scott Navarro was seen today for other.  Diagnoses and all orders for this visit:  Type 2 diabetes mellitus with  hyperglycemia, unspecified whether Holben term insulin use (HCC) -     Hemoglobin A1c; Future -     Comprehensive metabolic panel; Future  Charcot joint of left foot  Diabetic polyneuropathy associated with type 2 diabetes mellitus (HCC)  Elevated blood pressure reading -     Comprehensive metabolic panel; Future  Diabetic retinopathy associated with type 2 diabetes mellitus, macular edema presence unspecified, unspecified laterality, unspecified retinopathy severity (HCC)  Glaucoma, unspecified glaucoma type, unspecified laterality  Hyperlipidemia, unspecified hyperlipidemia type -     Lipid panel; Future -     Comprehensive metabolic panel; Future  Microalbuminuria -     Comprehensive metabolic panel; Future  Other orders -     tirzepatide (MOUNJARO) 15 MG/0.5ML Pen; Inject 15 mg into the skin once a week. -     valsartan (DIOVAN) 40 MG tablet; Take 1 tablet (40 mg total) by mouth daily.    Follow up: 3-4 weeks for fasting labs

## 2023-06-10 ENCOUNTER — Other Ambulatory Visit

## 2023-06-10 DIAGNOSIS — R030 Elevated blood-pressure reading, without diagnosis of hypertension: Secondary | ICD-10-CM

## 2023-06-10 DIAGNOSIS — E785 Hyperlipidemia, unspecified: Secondary | ICD-10-CM

## 2023-06-10 DIAGNOSIS — E1165 Type 2 diabetes mellitus with hyperglycemia: Secondary | ICD-10-CM

## 2023-06-10 DIAGNOSIS — R809 Proteinuria, unspecified: Secondary | ICD-10-CM

## 2023-06-11 LAB — COMPREHENSIVE METABOLIC PANEL WITH GFR
ALT: 21 IU/L (ref 0–44)
AST: 17 IU/L (ref 0–40)
Albumin: 4.1 g/dL (ref 3.8–4.9)
Alkaline Phosphatase: 86 IU/L (ref 44–121)
BUN/Creatinine Ratio: 17 (ref 9–20)
BUN: 22 mg/dL (ref 6–24)
Bilirubin Total: 0.3 mg/dL (ref 0.0–1.2)
CO2: 22 mmol/L (ref 20–29)
Calcium: 9.3 mg/dL (ref 8.7–10.2)
Chloride: 102 mmol/L (ref 96–106)
Creatinine, Ser: 1.31 mg/dL — ABNORMAL HIGH (ref 0.76–1.27)
Globulin, Total: 2.9 g/dL (ref 1.5–4.5)
Glucose: 120 mg/dL — ABNORMAL HIGH (ref 70–99)
Potassium: 4.3 mmol/L (ref 3.5–5.2)
Sodium: 141 mmol/L (ref 134–144)
Total Protein: 7 g/dL (ref 6.0–8.5)
eGFR: 63 mL/min/{1.73_m2} (ref >59)

## 2023-06-11 LAB — LIPID PANEL
Chol/HDL Ratio: 3 ratio (ref 0.0–5.0)
Cholesterol, Total: 131 mg/dL (ref 100–199)
HDL: 43 mg/dL (ref >39)
LDL Chol Calc (NIH): 70 mg/dL (ref 0–99)
Triglycerides: 93 mg/dL (ref 0–149)
VLDL Cholesterol Cal: 18 mg/dL (ref 5–40)

## 2023-06-11 LAB — HEMOGLOBIN A1C
Est. average glucose Bld gHb Est-mCnc: 146 mg/dL
Hgb A1c MFr Bld: 6.7 % — ABNORMAL HIGH (ref 4.8–5.6)

## 2023-06-11 LAB — SPECIMEN STATUS REPORT

## 2023-06-11 NOTE — Progress Notes (Signed)
 Results sent through MyChart

## 2023-07-22 ENCOUNTER — Emergency Department (HOSPITAL_COMMUNITY)

## 2023-07-22 ENCOUNTER — Inpatient Hospital Stay (HOSPITAL_COMMUNITY)
Admission: EM | Admit: 2023-07-22 | Discharge: 2023-07-30 | DRG: 853 | Disposition: A | Discharge status: Other Acute Inpatient Hospital | Attending: Orthopedic Surgery | Admitting: Orthopedic Surgery

## 2023-07-22 ENCOUNTER — Other Ambulatory Visit: Payer: Self-pay

## 2023-07-22 DIAGNOSIS — I10 Essential (primary) hypertension: Secondary | ICD-10-CM | POA: Diagnosis present

## 2023-07-22 DIAGNOSIS — E119 Type 2 diabetes mellitus without complications: Secondary | ICD-10-CM | POA: Diagnosis not present

## 2023-07-22 DIAGNOSIS — Z833 Family history of diabetes mellitus: Secondary | ICD-10-CM | POA: Diagnosis not present

## 2023-07-22 DIAGNOSIS — S88112A Complete traumatic amputation at level between knee and ankle, left lower leg, initial encounter: Principal | ICD-10-CM

## 2023-07-22 DIAGNOSIS — R9431 Abnormal electrocardiogram [ECG] [EKG]: Secondary | ICD-10-CM | POA: Diagnosis not present

## 2023-07-22 DIAGNOSIS — N179 Acute kidney failure, unspecified: Secondary | ICD-10-CM | POA: Diagnosis present

## 2023-07-22 DIAGNOSIS — Z823 Family history of stroke: Secondary | ICD-10-CM

## 2023-07-22 DIAGNOSIS — E785 Hyperlipidemia, unspecified: Secondary | ICD-10-CM | POA: Diagnosis present

## 2023-07-22 DIAGNOSIS — Z801 Family history of malignant neoplasm of trachea, bronchus and lung: Secondary | ICD-10-CM | POA: Diagnosis not present

## 2023-07-22 DIAGNOSIS — Z89512 Acquired absence of left leg below knee: Secondary | ICD-10-CM | POA: Diagnosis not present

## 2023-07-22 DIAGNOSIS — E871 Hypo-osmolality and hyponatremia: Secondary | ICD-10-CM | POA: Diagnosis present

## 2023-07-22 DIAGNOSIS — Z794 Long term (current) use of insulin: Secondary | ICD-10-CM

## 2023-07-22 DIAGNOSIS — R6520 Severe sepsis without septic shock: Secondary | ICD-10-CM | POA: Diagnosis present

## 2023-07-22 DIAGNOSIS — Z7985 Long-term (current) use of injectable non-insulin antidiabetic drugs: Secondary | ICD-10-CM | POA: Diagnosis not present

## 2023-07-22 DIAGNOSIS — I2489 Other forms of acute ischemic heart disease: Secondary | ICD-10-CM | POA: Diagnosis present

## 2023-07-22 DIAGNOSIS — E1165 Type 2 diabetes mellitus with hyperglycemia: Secondary | ICD-10-CM | POA: Diagnosis present

## 2023-07-22 DIAGNOSIS — E1142 Type 2 diabetes mellitus with diabetic polyneuropathy: Secondary | ICD-10-CM | POA: Diagnosis present

## 2023-07-22 DIAGNOSIS — Z820 Family history of epilepsy and other diseases of the nervous system: Secondary | ICD-10-CM

## 2023-07-22 DIAGNOSIS — L03116 Cellulitis of left lower limb: Secondary | ICD-10-CM | POA: Diagnosis present

## 2023-07-22 DIAGNOSIS — E11621 Type 2 diabetes mellitus with foot ulcer: Secondary | ICD-10-CM | POA: Diagnosis present

## 2023-07-22 DIAGNOSIS — A419 Sepsis, unspecified organism: Secondary | ICD-10-CM | POA: Diagnosis present

## 2023-07-22 DIAGNOSIS — M726 Necrotizing fasciitis: Principal | ICD-10-CM | POA: Diagnosis present

## 2023-07-22 DIAGNOSIS — Z1152 Encounter for screening for COVID-19: Secondary | ICD-10-CM | POA: Diagnosis not present

## 2023-07-22 DIAGNOSIS — F54 Psychological and behavioral factors associated with disorders or diseases classified elsewhere: Secondary | ICD-10-CM | POA: Diagnosis not present

## 2023-07-22 DIAGNOSIS — E11610 Type 2 diabetes mellitus with diabetic neuropathic arthropathy: Secondary | ICD-10-CM | POA: Diagnosis present

## 2023-07-22 DIAGNOSIS — E876 Hypokalemia: Secondary | ICD-10-CM | POA: Diagnosis present

## 2023-07-22 DIAGNOSIS — M14672 Charcot's joint, left ankle and foot: Secondary | ICD-10-CM | POA: Diagnosis not present

## 2023-07-22 DIAGNOSIS — K5900 Constipation, unspecified: Secondary | ICD-10-CM | POA: Diagnosis not present

## 2023-07-22 DIAGNOSIS — S88119A Complete traumatic amputation at level between knee and ankle, unspecified lower leg, initial encounter: Secondary | ICD-10-CM | POA: Diagnosis present

## 2023-07-22 DIAGNOSIS — D649 Anemia, unspecified: Secondary | ICD-10-CM | POA: Diagnosis present

## 2023-07-22 DIAGNOSIS — E1140 Type 2 diabetes mellitus with diabetic neuropathy, unspecified: Secondary | ICD-10-CM | POA: Diagnosis not present

## 2023-07-22 LAB — RESP PANEL BY RT-PCR (RSV, FLU A&B, COVID)  RVPGX2
Influenza A by PCR: NEGATIVE
Influenza B by PCR: NEGATIVE
Resp Syncytial Virus by PCR: NEGATIVE
SARS Coronavirus 2 by RT PCR: NEGATIVE

## 2023-07-22 LAB — PROTIME-INR
INR: 1.2 (ref 0.8–1.2)
Prothrombin Time: 15.5 s — ABNORMAL HIGH (ref 11.4–15.2)

## 2023-07-22 LAB — COMPREHENSIVE METABOLIC PANEL WITH GFR
ALT: 42 U/L (ref 0–44)
AST: 63 U/L — ABNORMAL HIGH (ref 15–41)
Albumin: 2 g/dL — ABNORMAL LOW (ref 3.5–5.0)
Alkaline Phosphatase: 204 U/L — ABNORMAL HIGH (ref 38–126)
Anion gap: 12 (ref 5–15)
BUN: 67 mg/dL — ABNORMAL HIGH (ref 6–20)
CO2: 23 mmol/L (ref 22–32)
Calcium: 8.4 mg/dL — ABNORMAL LOW (ref 8.9–10.3)
Chloride: 99 mmol/L (ref 98–111)
Creatinine, Ser: 2.34 mg/dL — ABNORMAL HIGH (ref 0.61–1.24)
GFR, Estimated: 31 mL/min — ABNORMAL LOW (ref >60)
Glucose, Bld: 341 mg/dL — ABNORMAL HIGH (ref 70–99)
Potassium: 3.3 mmol/L — ABNORMAL LOW (ref 3.5–5.1)
Sodium: 134 mmol/L — ABNORMAL LOW (ref 135–145)
Total Bilirubin: 1 mg/dL (ref 0.0–1.2)
Total Protein: 7 g/dL (ref 6.5–8.1)

## 2023-07-22 LAB — CBC WITH DIFFERENTIAL/PLATELET
Abs Immature Granulocytes: 1.65 10*3/uL — ABNORMAL HIGH (ref 0.00–0.07)
Basophils Absolute: 0.2 10*3/uL — ABNORMAL HIGH (ref 0.0–0.1)
Basophils Relative: 0 %
Eosinophils Absolute: 0.1 10*3/uL (ref 0.0–0.5)
Eosinophils Relative: 0 %
HCT: 32.5 % — ABNORMAL LOW (ref 39.0–52.0)
Hemoglobin: 11 g/dL — ABNORMAL LOW (ref 13.0–17.0)
Immature Granulocytes: 4 %
Lymphocytes Relative: 3 %
Lymphs Abs: 1.1 10*3/uL (ref 0.7–4.0)
MCH: 28.9 pg (ref 26.0–34.0)
MCHC: 33.8 g/dL (ref 30.0–36.0)
MCV: 85.5 fL (ref 80.0–100.0)
Monocytes Absolute: 1.8 10*3/uL — ABNORMAL HIGH (ref 0.1–1.0)
Monocytes Relative: 4 %
Neutro Abs: 39.8 10*3/uL — ABNORMAL HIGH (ref 1.7–7.7)
Neutrophils Relative %: 89 %
Platelets: 512 10*3/uL — ABNORMAL HIGH (ref 150–400)
RBC: 3.8 MIL/uL — ABNORMAL LOW (ref 4.22–5.81)
RDW: 13.7 % (ref 11.5–15.5)
WBC: 44.6 10*3/uL — ABNORMAL HIGH (ref 4.0–10.5)
nRBC: 0 % (ref 0.0–0.2)

## 2023-07-22 LAB — I-STAT CHEM 8, ED
BUN: 60 mg/dL — ABNORMAL HIGH (ref 6–20)
Calcium, Ion: 1.14 mmol/L — ABNORMAL LOW (ref 1.15–1.40)
Chloride: 99 mmol/L (ref 98–111)
Creatinine, Ser: 2.3 mg/dL — ABNORMAL HIGH (ref 0.61–1.24)
Glucose, Bld: 337 mg/dL — ABNORMAL HIGH (ref 70–99)
HCT: 35 % — ABNORMAL LOW (ref 39.0–52.0)
Hemoglobin: 11.9 g/dL — ABNORMAL LOW (ref 13.0–17.0)
Potassium: 3.4 mmol/L — ABNORMAL LOW (ref 3.5–5.1)
Sodium: 135 mmol/L (ref 135–145)
TCO2: 19 mmol/L — ABNORMAL LOW (ref 22–32)

## 2023-07-22 LAB — CBG MONITORING, ED: Glucose-Capillary: 329 mg/dL — ABNORMAL HIGH (ref 70–99)

## 2023-07-22 LAB — I-STAT CG4 LACTIC ACID, ED: Lactic Acid, Venous: 1.3 mmol/L (ref 0.5–1.9)

## 2023-07-22 MED ORDER — DEXTROSE IN LACTATED RINGERS 5 % IV SOLN: INTRAVENOUS | Status: AC

## 2023-07-22 MED ORDER — LACTATED RINGERS IV BOLUS (SEPSIS)
500.0000 mL | Freq: Once | INTRAVENOUS | Status: AC
  Administered 2023-07-22: 500 mL via INTRAVENOUS

## 2023-07-22 MED ORDER — LACTATED RINGERS IV BOLUS (SEPSIS)
1000.0000 mL | Freq: Once | INTRAVENOUS | Status: AC
  Administered 2023-07-22: 1000 mL via INTRAVENOUS

## 2023-07-22 MED ORDER — LINEZOLID 600 MG/300ML IV SOLN
600.0000 mg | Freq: Two times a day (BID) | INTRAVENOUS | Status: AC
  Administered 2023-07-23 – 2023-07-28 (×12): 600 mg via INTRAVENOUS
  Filled 2023-07-22 (×12): qty 300

## 2023-07-22 MED ORDER — INSULIN REGULAR(HUMAN) IN NACL 100-0.9 UT/100ML-% IV SOLN
INTRAVENOUS | Status: DC
  Administered 2023-07-23: 10.5 [IU]/h via INTRAVENOUS
  Filled 2023-07-22: qty 100

## 2023-07-22 MED ORDER — LINEZOLID 600 MG/300ML IV SOLN
600.0000 mg | Freq: Once | INTRAVENOUS | Status: AC
  Administered 2023-07-22: 600 mg via INTRAVENOUS
  Filled 2023-07-22: qty 300

## 2023-07-22 MED ORDER — LACTATED RINGERS IV SOLN: INTRAVENOUS | Status: AC

## 2023-07-22 MED ORDER — DEXTROSE 50 % IV SOLN: 0.0000 mL | INTRAVENOUS | Status: DC | PRN

## 2023-07-22 MED ORDER — PIPERACILLIN-TAZOBACTAM 3.375 G IVPB 30 MIN
3.3750 g | Freq: Once | INTRAVENOUS | Status: AC
  Administered 2023-07-22: 3.375 g via INTRAVENOUS
  Filled 2023-07-22: qty 50

## 2023-07-22 MED ORDER — IOHEXOL 350 MG/ML SOLN
75.0000 mL | Freq: Once | INTRAVENOUS | Status: AC | PRN
Start: 2023-07-22 — End: 2023-07-22
  Administered 2023-07-22: 75 mL via INTRAVENOUS

## 2023-07-22 MED ORDER — PIPERACILLIN-TAZOBACTAM 3.375 G IVPB
3.3750 g | Freq: Three times a day (TID) | INTRAVENOUS | Status: AC
  Administered 2023-07-23 – 2023-07-28 (×18): 3.375 g via INTRAVENOUS
  Filled 2023-07-22 (×17): qty 50

## 2023-07-22 NOTE — Sepsis Progress Note (Signed)
 Elink monitoring for the code sepsis protocol.

## 2023-07-22 NOTE — H&P (Signed)
 History and Physical    [name]. ZOX:[id number] DOB: 29-May-1965 DOA: 07/22/2023  Patient coming from: Home.  Chief Complaint: Left leg wound.  HPI: Scott Navarro. is a 58 y.o. male with history of diabetes mellitus type 2 presents to the ER because of worsening wound on the left leg.  Patient states about 4 to 5 days ago he noticed small discoloration on the lateral aspect of his left foot which has rapidly progressed involving the whole left foot with discharge and blebs.  Denies any recent trauma or insect bites.  Denies fever or chills.  Patient states over the last 1 week he has been feeling poorly with weakness and shortness of breath and some chest tightness.  ED Course: In the ER CT scan shows extensive gas formation in the entire left lower extremity and foot.  Dr. Hulda Mage orthopedic surgeon was consulted.  Plans to take patient to the OR in the morning.  Patient's labs show WBC of 44 hemoglobin of 11 creatinine of 2.3 glucose of 341 bicarb 23 lactic acid 1.3 potassium 3.2.  Potassium replacement was ordered.  Chest x-ray unremarkable EKG shows sinus tachycardia with prolonged QTc.  Review of Systems: As per HPI, rest all negative.   Past Medical History:  Diagnosis Date   Type II diabetes mellitus (HCC) dx'd 10/08/2016    Past Surgical History:  Procedure Laterality Date   AMPUTATION Right 10/09/2016   Procedure: INCISION AND DRAINAGE RIGHT GREAT TOE;  Surgeon: Timothy Ford, MD;  Location: [facility] OR;  Service: Orthopedics;  Laterality: Right;     reports that he has never smoked. He has never used smokeless tobacco. He reports that he does not drink alcohol and does not use drugs.  Allergies  Allergen Reactions   Metformin  And Related     diarrhea    Family History  Problem Relation Age of Onset   Diabetes Father    Lung cancer Other    Stroke Other    CAD Neg Hx    Hypertension Neg Hx     Prior to Admission medications   Medication Sig Start Date End Date  Taking? Authorizing Provider  Continuous Glucose Sensor (FREESTYLE LIBRE 3 PLUS SENSOR) MISC Change sensor every 15 days. 01/07/23   Tysinger, Christiane Cowing, PA-C  insulin  lispro (HUMALOG  KWIKPEN) 100 UNIT/ML KwikPen Inject 8 Units into the skin 3 (three) times daily. Patient not taking: Reported on 05/15/2023 02/03/23   Tysinger, Christiane Cowing, PA-C  rosuvastatin  (CRESTOR ) 20 MG tablet Take 1 tablet (20 mg total) by mouth daily. 02/25/23 02/25/24  Tysinger, Christiane Cowing, PA-C  tirzepatide  (MOUNJARO ) 15 MG/0.5ML Pen Inject 15 mg into the skin once a week. 05/15/23   Tysinger, Christiane Cowing, PA-C  valsartan  (DIOVAN ) 40 MG tablet Take 1 tablet (40 mg total) by mouth daily. 05/15/23   Tysinger, Christiane Cowing, PA-C    Physical Exam: Constitutional: Moderately built and nourished. Vitals:   07/22/23 2045 07/22/23 2100 07/22/23 2115 07/22/23 2130  BP: (!) 183/78 (!) 175/108 (!) 157/82 (!) 158/79  Pulse: (!) 106 (!) 106 (!) 105 100  Resp: 17 20 17 20   Temp:      SpO2: 100% 100% 100% 100%   Eyes: Anicteric no pallor. ENMT: No discharge from the ears/nose or mouth. Neck: No mass felt.  No neck rigidity. Respiratory: No rhonchi or crepitations. Cardiovascular: S1-S2 heard. Abdomen: Soft nontender bowel sound present. Musculoskeletal: Left foot appears swollen with necrosis wet gangrene. Skin: Left foot has wet gangrene  with necrosis and black eschar. Neurologic: Alert awake oriented time place and person.  Moves all extremities. Psychiatric: Appears normal.  Normal affect.   Labs on Admission: I have personally reviewed following labs and imaging studies  CBC: Recent Labs  Lab 07/22/23 1950 07/22/23 1952  WBC  --  44.6*  NEUTROABS  --  39.8*  HGB 11.9* 11.0*  HCT 35.0* 32.5*  MCV  --  85.5  PLT  --  512*   Basic Metabolic Panel: Recent Labs  Lab 07/22/23 1950 07/22/23 1952  NA 135 134*  K 3.4* 3.3*  CL 99 99  CO2  --  23  GLUCOSE 337* 341*  BUN 60* 67*  CREATININE 2.30* 2.34*  CALCIUM   --  8.4*    GFR: CrCl cannot be calculated (Unknown ideal weight.). Liver Function Tests: Recent Labs  Lab 07/22/23 1952  AST 63*  ALT 42  ALKPHOS 204*  BILITOT 1.0  PROT 7.0  ALBUMIN 2.0*   No results for input(s): "LIPASE", "AMYLASE" in the last 168 hours. No results for input(s): "AMMONIA" in the last 168 hours. Coagulation Profile: Recent Labs  Lab 07/22/23 1952  INR 1.2   Cardiac Enzymes: No results for input(s): "CKTOTAL", "CKMB", "CKMBINDEX", "TROPONINI" in the last 168 hours. BNP (last 3 results) No results for input(s): "PROBNP" in the last 8760 hours. HbA1C: No results for input(s): "HGBA1C" in the last 72 hours. CBG: Recent Labs  Lab 07/22/23 1901  GLUCAP 329*   Lipid Profile: No results for input(s): "CHOL", "HDL", "LDLCALC", "TRIG", "CHOLHDL", "LDLDIRECT" in the last 72 hours. Thyroid Function Tests: No results for input(s): "TSH", "T4TOTAL", "FREET4", "T3FREE", "THYROIDAB" in the last 72 hours. Anemia Panel: No results for input(s): "VITAMINB12", "FOLATE", "FERRITIN", "TIBC", "IRON", "RETICCTPCT" in the last 72 hours. Urine analysis:    Component Value Date/Time   COLORURINE STRAW (A) 10/09/2016 0200   APPEARANCEUR CLEAR 10/09/2016 0200   LABSPEC 1.010 11/21/2022 1455   PHURINE 5.0 10/09/2016 0200   GLUCOSEU >=500 (A) 10/09/2016 0200   HGBUR NEGATIVE 10/09/2016 0200   BILIRUBINUR negative 11/21/2022 1455   KETONESUR negative 11/21/2022 1455   KETONESUR 20 (A) 10/09/2016 0200   PROTEINUR =100 (A) 11/21/2022 1455   PROTEINUR NEGATIVE 10/09/2016 0200   NITRITE Negative 11/21/2022 1455   NITRITE NEGATIVE 10/09/2016 0200   LEUKOCYTESUR Negative 11/21/2022 1455   Sepsis Labs: @LABRCNTIP (procalcitonin:4,lacticidven:4) ) Recent Results (from the past 240 hours)  Resp panel by RT-PCR (RSV, Flu A&B, Covid) Anterior Nasal Swab     Status: None   Collection Time: 07/22/23  7:22 PM   Specimen: Anterior Nasal Swab  Result Value Ref Range Status   SARS  Coronavirus 2 by RT PCR NEGATIVE NEGATIVE Final   Influenza A by PCR NEGATIVE NEGATIVE Final   Influenza B by PCR NEGATIVE NEGATIVE Final    Comment: (NOTE) The Xpert Xpress SARS-CoV-2/FLU/RSV plus assay is intended as an aid in the diagnosis of influenza from Nasopharyngeal swab specimens and should not be used as a sole basis for treatment. Nasal washings and aspirates are unacceptable for Xpert Xpress SARS-CoV-2/FLU/RSV testing.  Fact Sheet for Patients: BloggerCourse.com  Fact Sheet for Healthcare Providers: SeriousBroker.it  This test is not yet approved or cleared by the [place]  FDA and has been authorized for detection and/or diagnosis of SARS-CoV-2 by FDA under an Emergency Use Authorization (EUA). This EUA will remain in effect (meaning this test can be used) for the duration of the COVID-19 declaration under Section 564(b)(1) of the Act, 21  U.S.C. section 360bbb-3(b)(1), unless the authorization is terminated or revoked.     Resp Syncytial Virus by PCR NEGATIVE NEGATIVE Final    Comment: (NOTE) Fact Sheet for Patients: BloggerCourse.com  Fact Sheet for Healthcare Providers: SeriousBroker.it  This test is not yet approved or cleared by the [place]  FDA and has been authorized for detection and/or diagnosis of SARS-CoV-2 by FDA under an Emergency Use Authorization (EUA). This EUA will remain in effect (meaning this test can be used) for the duration of the COVID-19 declaration under Section 564(b)(1) of the Act, 21 U.S.C. section 360bbb-3(b)(1), unless the authorization is terminated or revoked.  Performed at [facility], [address] [place], [place] [address]      Radiological Exams on Admission: CT EXTREMITY LOWER LEFT W CONTRAST Result Date: 07/22/2023 CLINICAL DATA:  Soft tissue infection suspected, lower leg, xray done EXAM: CT OF THE  LOWER LEFT EXTREMITY WITH CONTRAST TECHNIQUE: Multidetector CT imaging of the lower left extremity was performed according to the standard protocol following intravenous contrast administration. RADIATION DOSE REDUCTION: This exam was performed according to the departmental dose-optimization program which includes automated exposure control, adjustment of the mA and/or kV according to patient size and/or use of iterative reconstruction technique. CONTRAST:  75mL OMNIPAQUE IOHEXOL 350 MG/ML SOLN COMPARISON:  None Available. FINDINGS: Bones/Joint/Cartilage No acute bony abnormality. Specifically, no fracture, subluxation, or dislocation. No bone destruction seen to suggest osteomyelitis. 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 Subchondral cystic change throughout the midfoot and hindfoot. Ligaments Suboptimally assessed by CT. Muscles and Tendons Unremarkable. Soft tissues Gas is noted throughout the soft tissues in the left foot and extends proximally throughout the soft tissues of the left calf. This also extends along the fascial planes in the region of the knee posteriorly and within the fascial planes adjacent to the hamstring muscles up into the proximal thigh. Diffuse calcifications throughout the superficial femoral artery and popliteal artery, but the vessels are patent. Trifurcation vessels are diseased but patent into the distal calf. IMPRESSION: Extensive soft tissue gas throughout the left leg and foot. No acute bony abnormality.  No fracture or bone destruction. Electronically Signed   By: Janeece Mechanic M.D.   On: 07/22/2023 20:32   DG Chest Port 1 View Result Date: 07/22/2023 CLINICAL DATA:  Sepsis EXAM: PORTABLE CHEST 1 VIEW COMPARISON:  Chest radiograph dated 12/19/2008 FINDINGS: Normal lung volumes. No focal consolidations. No pleural effusion or pneumothorax. The heart size and mediastinal contours are within normal limits. No acute osseous abnormality. IMPRESSION: No acute  disease. Electronically Signed   By: Limin  Xu M.D.   On: 07/22/2023 20:15   DG Foot 2 Views Left Result Date: 07/22/2023 CLINICAL DATA:  Left foot wound EXAM: LEFT FOOT - 2 VIEW COMPARISON:  None Available. FINDINGS: There is no evidence of fracture or dislocation. Extensive subcutaneous emphysema involving the mid and hindfoot extending into the partially imaged distal lower leg. Charcot joint of the hindfoot. IMPRESSION: 1. Extensive subcutaneous emphysema involving the mid and hindfoot extending into the partially imaged distal lower leg. 2. Charcot joint of the hindfoot. Electronically Signed   By: Limin  Xu M.D.   On: 07/22/2023 20:13    EKG: Independently reviewed.  Sinus tachycardia prolonged QTc.  Assessment/Plan Principal Problem:   Necrotizing fasciitis (HCC) Active Problems:   Type 2 diabetes mellitus with hyperglycemia (HCC)   Charcot joint of left foot   Anemia   Hyponatremia    Necrotizing fasciitis of the left lower  extremity -    appreciate orthopedic surgery consult.  Plan is to take patient to the OR in the morning.  On Zyvox and Zosyn.  Follow cultures continue hydration keep patient NPO. Diabetes mellitus type 2 takes Mounjaro  at home.  Last hemoglobin A1c was 6.7 a month ago.  Given the acuity of patient's condition will start patient on insulin  infusion. Hypertension takes ARB.  Will keep patient on as needed IV hydralazine for now. Anemia appears to be chronic follow CBC. Hyperlipidemia takes statins. Acute renal failure likely from sepsis.  Closely monitor intake output metabolic panel hold ARB continue hydration. Hypokalemia replacement ordered.  Follow metabolic panel. Prolonged QTc replace electrolytes and recheck EKG.  Since patient has necrotizing fasciitis will need close monitoring more than 2 midnight stay.   DVT prophylaxis: Avoiding anticoagulation in anticipation of procedure. Code Status: Full code. Family Communication: Patient's  wife. Disposition Plan: Progressive care. Consults called: Orthopedics. Admission status: Inpatient.

## 2023-07-22 NOTE — ED Provider Notes (Signed)
 [place] [facility] AT [facility] Provider Note   CSN: [id number] Arrival date & time: 07/22/23  1853     History  Chief Complaint  Patient presents with   Wound Check    Scott Navarro. is a 58 y.o. male with past medical history significant for diabetes, diabetic polyneuropathy, Charcot foot who presents with concern for wound to left foot, reports that he noticed it for the first time on Friday when wife was redressing the wound, black at the time, since then with rapid progression, foul odor, drainage, denies significant pain, arrives hypotensive, weak, tachycardic.   Wound Check       Home Medications Prior to Admission medications   Medication Sig Start Date End Date Taking? Authorizing Provider  Continuous Glucose Sensor (FREESTYLE LIBRE 3 PLUS SENSOR) MISC Change sensor every 15 days. 01/07/23   Tysinger, Christiane Cowing, PA-C  insulin  lispro (HUMALOG  KWIKPEN) 100 UNIT/ML KwikPen Inject 8 Units into the skin 3 (three) times daily. Patient not taking: Reported on 05/15/2023 02/03/23   Tysinger, Christiane Cowing, PA-C  rosuvastatin  (CRESTOR ) 20 MG tablet Take 1 tablet (20 mg total) by mouth daily. 02/25/23 02/25/24  Tysinger, Christiane Cowing, PA-C  tirzepatide  (MOUNJARO ) 15 MG/0.5ML Pen Inject 15 mg into the skin once a week. 05/15/23   Tysinger, Christiane Cowing, PA-C  valsartan  (DIOVAN ) 40 MG tablet Take 1 tablet (40 mg total) by mouth daily. 05/15/23   Tysinger, Christiane Cowing, PA-C      Allergies    Metformin  and related    Review of Systems   Review of Systems  All other systems reviewed and are negative.   Physical Exam Updated Vital Signs BP (!) 158/79   Pulse 100   Temp 98.3 F (36.8 C)   Resp 20   SpO2 100%  Physical Exam Vitals and nursing note reviewed.  Constitutional:      Appearance: He is ill-appearing and toxic-appearing.  HENT:     Head: Normocephalic and atraumatic.  Eyes:     General:        Right eye: No discharge.        Left eye: No discharge.   Cardiovascular:     Rate and Rhythm: Regular rhythm. Tachycardia present.     Heart sounds: No murmur heard.    No friction rub. No gallop.     Comments: Able to Doppler weak DP and PT pulses in the affected left lower extremity Pulmonary:     Effort: Pulmonary effort is normal.     Breath sounds: Normal breath sounds.  Abdominal:     General: Bowel sounds are normal.     Palpations: Abdomen is soft.  Skin:    Capillary Refill: Capillary refill takes 2 to 3 seconds.     Comments: Foul-smelling necrotizing wound noted to left foot with crepitus, at least two thirds of the dorsum of the foot is black, with significant foul-smelling drainage  Neurological:     Mental Status: He is alert and oriented to person, place, and time.  Psychiatric:        Mood and Affect: Mood normal.        Behavior: Behavior normal.     ED Results / Procedures / Treatments   Labs (all labs ordered are listed, but only abnormal results are displayed) Labs Reviewed  COMPREHENSIVE METABOLIC PANEL WITH GFR - Abnormal; Notable for the following components:      Result Value   Sodium 134 (*)    Potassium  3.3 (*)    Glucose, Bld 341 (*)    BUN 67 (*)    Creatinine, Ser 2.34 (*)    Calcium  8.4 (*)    Albumin 2.0 (*)    AST 63 (*)    Alkaline Phosphatase 204 (*)    GFR, Estimated 31 (*)    All other components within normal limits  CBC WITH DIFFERENTIAL/PLATELET - Abnormal; Notable for the following components:   WBC 44.6 (*)    RBC 3.80 (*)    Hemoglobin 11.0 (*)    HCT 32.5 (*)    Platelets 512 (*)    Neutro Abs 39.8 (*)    Monocytes Absolute 1.8 (*)    Basophils Absolute 0.2 (*)    Abs Immature Granulocytes 1.65 (*)    All other components within normal limits  PROTIME-INR - Abnormal; Notable for the following components:   Prothrombin Time 15.5 (*)    All other components within normal limits  CBG MONITORING, ED - Abnormal; Notable for the following components:   Glucose-Capillary 329 (*)     All other components within normal limits  I-STAT CHEM 8, ED - Abnormal; Notable for the following components:   Potassium 3.4 (*)    BUN 60 (*)    Creatinine, Ser 2.30 (*)    Glucose, Bld 337 (*)    Calcium , Ion 1.14 (*)    TCO2 19 (*)    Hemoglobin 11.9 (*)    HCT 35.0 (*)    All other components within normal limits  RESP PANEL BY RT-PCR (RSV, FLU A&B, COVID)  RVPGX2  CULTURE, BLOOD (ROUTINE X 2)  CULTURE, BLOOD (ROUTINE X 2)  URINALYSIS, W/ REFLEX TO CULTURE (INFECTION SUSPECTED)  HIV ANTIBODY (ROUTINE TESTING W REFLEX)  BASIC METABOLIC PANEL WITH GFR  I-STAT CG4 LACTIC ACID, ED  I-STAT CHEM 8, ED  I-STAT CG4 LACTIC ACID, ED    EKG None  Radiology CT EXTREMITY LOWER LEFT W CONTRAST Result Date: 07/22/2023 CLINICAL DATA:  Soft tissue infection suspected, lower leg, xray done EXAM: CT OF THE LOWER LEFT EXTREMITY WITH CONTRAST TECHNIQUE: Multidetector CT imaging of the lower left extremity was performed according to the standard protocol following intravenous contrast administration. RADIATION DOSE REDUCTION: This exam was performed according to the departmental dose-optimization program which includes automated exposure control, adjustment of the mA and/or kV according to patient size and/or use of iterative reconstruction technique. CONTRAST:  75mL OMNIPAQUE IOHEXOL 350 MG/ML SOLN COMPARISON:  None Available. FINDINGS: Bones/Joint/Cartilage No acute bony abnormality. Specifically, no fracture, subluxation, or dislocation. No bone destruction seen to suggest osteomyelitis. 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 Subchondral cystic change throughout the midfoot and hindfoot. Ligaments Suboptimally assessed by CT. Muscles and Tendons Unremarkable. Soft tissues Gas is noted throughout the soft tissues in the left foot and extends proximally throughout the soft tissues of the left calf. This also extends along the fascial planes in the region of the knee posteriorly  and within the fascial planes adjacent to the hamstring muscles up into the proximal thigh. Diffuse calcifications throughout the superficial femoral artery and popliteal artery, but the vessels are patent. Trifurcation vessels are diseased but patent into the distal calf. IMPRESSION: Extensive soft tissue gas throughout the left leg and foot. No acute bony abnormality.  No fracture or bone destruction. Electronically Signed   By: Janeece Mechanic M.D.   On: 07/22/2023 20:32   DG Chest Port 1 View Result Date: 07/22/2023 CLINICAL DATA:  Sepsis EXAM: PORTABLE CHEST  1 VIEW COMPARISON:  Chest radiograph dated 12/19/2008 FINDINGS: Normal lung volumes. No focal consolidations. No pleural effusion or pneumothorax. The heart size and mediastinal contours are within normal limits. No acute osseous abnormality. IMPRESSION: No acute disease. Electronically Signed   By: Limin  Xu M.D.   On: 07/22/2023 20:15   DG Foot 2 Views Left Result Date: 07/22/2023 CLINICAL DATA:  Left foot wound EXAM: LEFT FOOT - 2 VIEW COMPARISON:  None Available. FINDINGS: There is no evidence of fracture or dislocation. Extensive subcutaneous emphysema involving the mid and hindfoot extending into the partially imaged distal lower leg. Charcot joint of the hindfoot. IMPRESSION: 1. Extensive subcutaneous emphysema involving the mid and hindfoot extending into the partially imaged distal lower leg. 2. Charcot joint of the hindfoot. Electronically Signed   By: Limin  Xu M.D.   On: 07/22/2023 20:13    Procedures .Critical Care  Performed by: Nelly Banco, PA-C Authorized by: Nelly Banco, PA-C   Critical care provider statement:    Critical care time (minutes):  75   Critical care was necessary to treat or prevent imminent or life-threatening deterioration of the following conditions:  Sepsis and shock   Critical care was time spent personally by me on the following activities:  Development of treatment plan with patient or  surrogate, discussions with consultants, evaluation of patient's response to treatment, examination of patient, ordering and review of laboratory studies, ordering and review of radiographic studies, ordering and performing treatments and interventions, pulse oximetry, re-evaluation of patient's condition and review of old charts     Medications Ordered in ED Medications  lactated ringers  infusion ( Intravenous New Bag/Given 07/22/23 2111)  insulin  regular, human (MYXREDLIN) 100 units/ 100 mL infusion (has no administration in time range)  lactated ringers  infusion (has no administration in time range)  dextrose  5 % in lactated ringers  infusion (has no administration in time range)  dextrose  50 % solution 0-50 mL (has no administration in time range)  lactated ringers  bolus 1,000 mL (0 mLs Intravenous Stopped 07/22/23 2200)    And  lactated ringers  bolus 1,000 mL (0 mLs Intravenous Stopped 07/22/23 2200)    And  lactated ringers  bolus 500 mL (0 mLs Intravenous Stopped 07/22/23 2110)  piperacillin-tazobactam (ZOSYN) IVPB 3.375 g (0 g Intravenous Stopped 07/22/23 2110)  linezolid (ZYVOX) IVPB 600 mg (0 mg Intravenous Stopped 07/22/23 2200)  iohexol (OMNIPAQUE) 350 MG/ML injection 75 mL (75 mLs Intravenous Contrast Given 07/22/23 2019)    ED Course/ Medical Decision Making/ A&P Clinical Course as of 07/22/23 2310  Tue Jul 22, 2023  1940 Dopplerable pulses dp and pt [CP]    Clinical Course User Index [CP] Nelly Banco, PA-C                                 Medical Decision Making  This patient is a 58 y.o. male who presents to the ED for concern of foot wound, obvious sepsis, this involves an extensive number of treatment options, and is a complaint that carries with it a high risk of complications and morbidity. The emergent differential diagnosis prior to evaluation includes, but is not limited to,  sepsis, necrotizing fasciitis vs cellulitis vs other . This is not an exhaustive  differential.   Past Medical History / Co-morbidities / Social History: diabetes, diabetic polyneuropathy, Charcot foot   Additional history: Chart reviewed. Pertinent results include: extensively reviewed outpatient podiatry visits  Physical  Exam: Physical exam performed. The pertinent findings include: Capillary refill takes 2 to 3 seconds.     Comments: Foul-smelling necrotizing wound noted to left foot with crepitus, at least two thirds of the dorsum of the foot is black, with significant foul-smelling drainage   Tachycardia present.     Heart sounds: No murmur heard.    No friction rub. No gallop.     Comments: Able to Doppler weak DP and PT pulses in the affected left lower extremity  Patient is septic, toxic appearing, clearly quite ill, initially 78/53 -- improved to 158/79  Lab Tests: I ordered, and personally interpreted labs.  The pertinent results include: CBC notable for severe leukocytosis, white blood cells 44.6, hemoglobin 11, platelets elevated at 512, acute phase reactant, CMP notable for mild hyponatremia, sodium 134, calcium  3.3.  He has an AKI, BUN 67, creatinine 2.34, hyperglycemia glucose 341, alk phosphatase is elevated 204.  Initial lactic acid is normal at 1.5.   Imaging Studies: I ordered imaging studies including plain film of left foot with free gas, CT of lower extremity confirms severe swelling, findings concerning for gaseous infection " Extensive soft tissue gas throughout the left leg and foot.    No acute bony abnormality.  No fracture or bone destruction.  ", plain film chest xray overall unremarkable. I agree with the radiologist interpretation.   Cardiac Monitoring:  The patient was maintained on a cardiac monitor.  My attending physician Dr. Nolia Baumgartner viewed and interpreted the cardiac monitored which showed an underlying rhythm of: sinus tachycardia, prolonged QT. I agree with this interpretation.   Medications: I ordered medication including 30  cc/kg fluid bolus, linezolid, and Zosyn for severe wound infection, suspicion for necrotizing fasciitis  Consultations Obtained: I requested consultation with the orthopedic physician, Dr. Hulda Mage,  and discussed lab and imaging findings as well as pertinent plan --he evaluated the patient at bedside, and after extensive discussion, we will plan for admission given his vitals have stabilized, IV antibiotics, and consult for presumed amputation in the morning.  I spoke with the hospitalist, Dr. [facility] Lavender who agrees to admission for all of the above, severe wound infection, sepsis.   Disposition: After consideration of the diagnostic results and the patients response to treatment, I feel that patient would benefit from admission for sepsis secondary to severe wound infection concerning for rapid progression  I discussed this case with my attending physician Dr. Nolia Baumgartner who cosigned this note including patient's presenting symptoms, physical exam, and planned diagnostics and interventions. Attending physician stated agreement with plan or made changes to plan which were implemented.    Final Clinical Impression(s) / ED Diagnoses Final diagnoses:  None    Rx / DC Orders ED Discharge Orders     None         Scott Navarro 07/22/23 2310    Scott Los, MD 07/23/23 1609

## 2023-07-22 NOTE — Consult Note (Signed)
 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 is an 58 y.o. male.  HPI: Patient presented to the emergency department today with swelling, skin color changes and infection signs to his left foot and ankle.  Patient states last Friday during dressing changes he noted a blackened area of skin on the lateral aspect of his foot.  The area of swelling and blackened skin worsened over the course of a couple of days and seem to stabilize on Monday.  Patient was worried about it and therefore presented to the emergency department today.  He denies any systemic symptoms, fever, malaise or chills.  He has no systemic symptoms of illness.  He does have a history of a chronic wound on the plantar aspect of his foot due to collapse of his transverse tarsal joints consistent with Charcot.  He has been following with podiatry for that.  He states the wound has been present off and on for the past 6 years.  Past Medical History:  Diagnosis Date   Type II diabetes mellitus (HCC) dx'd 10/08/2016    Past Surgical History:  Procedure Laterality Date   AMPUTATION Right 10/09/2016   Procedure: INCISION AND DRAINAGE RIGHT GREAT TOE;  Surgeon: Timothy Ford, MD;  Location: [facility] OR;  Service: Orthopedics;  Laterality: Right;    Family History  Problem Relation Age of Onset   Diabetes Father    Lung cancer Other    Stroke Other    CAD Neg Hx    Hypertension Neg Hx     Social History:  reports that he has never smoked. He has never used smokeless tobacco. He reports that he does not drink alcohol and does not use drugs.  Allergies:  Allergies  Allergen Reactions   Metformin  And Related     diarrhea    Medications: I have reviewed the patient's current medications.  Results for orders placed or performed during the hospital encounter  of 07/22/23 (from the past 48 hours)  CBG monitoring, ED     Status: Abnormal   Collection Time: 07/22/23  7:01 PM  Result Value Ref Range   Glucose-Capillary 329 (H) 70 - 99 mg/dL    Comment: Glucose reference range applies only to samples taken after fasting for at least 8 hours.  I-stat chem 8, ED     Status: Abnormal   Collection Time: 07/22/23  7:50 PM  Result Value Ref Range   Sodium 135 135 - 145 mmol/L   Potassium 3.4 (L) 3.5 - 5.1 mmol/L   Chloride 99 98 - 111 mmol/L   BUN 60 (H) 6 - 20 mg/dL   Creatinine, Ser 7.82 (H) 0.61 - 1.24 mg/dL   Glucose, Bld 956 (H) 70 - 99 mg/dL    Comment: Glucose reference range applies only to samples taken after fasting for at least 8 hours.   Calcium , Ion 1.14 (L) 1.15 - 1.40 mmol/L   TCO2 19 (L) 22 - 32 mmol/L   Hemoglobin 11.9 (L) 13.0 - 17.0 g/dL   HCT 21.3 (L) 08.6 - 57.8 %  I-Stat Lactic Acid, ED     Status: None   Collection Time: 07/22/23  7:51 PM  Result Value Ref Range   Lactic Acid, Venous 1.3 0.5 - 1.9 mmol/L  Comprehensive metabolic panel     Status: Abnormal   Collection Time: 07/22/23  7:52  PM  Result Value Ref Range   Sodium 134 (L) 135 - 145 mmol/L   Potassium 3.3 (L) 3.5 - 5.1 mmol/L   Chloride 99 98 - 111 mmol/L   CO2 23 22 - 32 mmol/L   Glucose, Bld 341 (H) 70 - 99 mg/dL    Comment: Glucose reference range applies only to samples taken after fasting for at least 8 hours.   BUN 67 (H) 6 - 20 mg/dL   Creatinine, Ser 1.61 (H) 0.61 - 1.24 mg/dL   Calcium  8.4 (L) 8.9 - 10.3 mg/dL   Total Protein 7.0 6.5 - 8.1 g/dL   Albumin 2.0 (L) 3.5 - 5.0 g/dL   AST 63 (H) 15 - 41 U/L   ALT 42 0 - 44 U/L   Alkaline Phosphatase 204 (H) 38 - 126 U/L   Total Bilirubin 1.0 0.0 - 1.2 mg/dL   GFR, Estimated 31 (L) >60 mL/min    Comment: (NOTE) Calculated using the CKD-EPI Creatinine Equation (2021)    Anion gap 12 5 - 15    Comment: Performed at [facility], [address], [place], [place] [address]  CBC with Differential      Status: Abnormal (Preliminary result)   Collection Time: 07/22/23  7:52 PM  Result Value Ref Range   WBC 44.6 (H) 4.0 - 10.5 K/uL   RBC 3.80 (L) 4.22 - 5.81 MIL/uL   Hemoglobin 11.0 (L) 13.0 - 17.0 g/dL   HCT 54.0 (L) 98.1 - 19.1 %   MCV 85.5 80.0 - 100.0 fL   MCH 28.9 26.0 - 34.0 pg   MCHC 33.8 30.0 - 36.0 g/dL   RDW 47.8 29.5 - 62.1 %   Platelets 512 (H) 150 - 400 K/uL   nRBC 0.0 0.0 - 0.2 %    Comment: Performed at [facility], [address] [place], [place] [address]   Neutrophils Relative % PENDING %   Neutro Abs PENDING 1.7 - 7.7 K/uL   Band Neutrophils PENDING %   Lymphocytes Relative PENDING %   Lymphs Abs PENDING 0.7 - 4.0 K/uL   Monocytes Relative PENDING %   Monocytes Absolute PENDING 0.1 - 1.0 K/uL   Eosinophils Relative PENDING %   Eosinophils Absolute PENDING 0.0 - 0.5 K/uL   Basophils Relative PENDING %   Basophils Absolute PENDING 0.0 - 0.1 K/uL   WBC Morphology PENDING    RBC Morphology PENDING    Smear Review PENDING    Other PENDING %   nRBC PENDING 0 /100 WBC   Metamyelocytes Relative PENDING %   Myelocytes PENDING %   Promyelocytes Relative PENDING %   Blasts PENDING %   Immature Granulocytes PENDING %   Abs Immature Granulocytes PENDING 0.00 - 0.07 K/uL  Protime-INR     Status: Abnormal   Collection Time: 07/22/23  7:52 PM  Result Value Ref Range   Prothrombin Time 15.5 (H) 11.4 - 15.2 seconds   INR 1.2 0.8 - 1.2    Comment: (NOTE) INR goal varies based on device and disease states. Performed at [facility], [address], [place], [place] [address]     CT EXTREMITY LOWER LEFT W CONTRAST Result Date: 07/22/2023 CLINICAL DATA:  Soft tissue infection suspected, lower leg, xray done EXAM: CT OF THE LOWER LEFT EXTREMITY WITH CONTRAST TECHNIQUE: Multidetector CT imaging of the lower left extremity was performed according to the standard protocol following intravenous contrast administration. RADIATION DOSE REDUCTION: This exam was  performed according to the departmental dose-optimization program which includes automated exposure control, adjustment of the mA and/or kV according to patient size and/or use of iterative reconstruction technique. CONTRAST:  75mL OMNIPAQUE IOHEXOL 350 MG/ML SOLN COMPARISON:  None Available. FINDINGS: Bones/Joint/Cartilage No acute bony abnormality. Specifically, no fracture, subluxation, or dislocation. No bone destruction seen to suggest osteomyelitis. 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 Subchondral cystic change throughout the midfoot and hindfoot. Ligaments Suboptimally assessed by CT. Muscles and Tendons Unremarkable. Soft tissues Gas is noted throughout the soft tissues in the left foot and extends proximally throughout the soft tissues of the left calf. This also extends along the fascial planes in the region of the knee posteriorly and within the fascial planes adjacent to the hamstring muscles up into the proximal thigh. Diffuse calcifications throughout the superficial femoral artery and popliteal artery, but the vessels are patent. Trifurcation vessels are diseased but patent into the distal calf. IMPRESSION: Extensive soft tissue gas throughout the left leg and foot. No acute bony abnormality.  No fracture or bone destruction. Electronically Signed   By: Janeece Mechanic M.D.   On: 07/22/2023 20:32   DG Chest Port 1 View Result Date: 07/22/2023 CLINICAL DATA:  Sepsis EXAM: PORTABLE CHEST 1 VIEW COMPARISON:  Chest radiograph dated 12/19/2008 FINDINGS: Normal lung volumes. No focal consolidations. No pleural effusion or pneumothorax. The heart size and mediastinal contours are within normal limits. No acute osseous abnormality. IMPRESSION: No acute disease. Electronically Signed   By: Limin  Xu M.D.   On: 07/22/2023 20:15   DG Foot 2 Views Left Result Date: 07/22/2023 CLINICAL DATA:  Left foot wound EXAM: LEFT FOOT - 2 VIEW COMPARISON:  None Available. FINDINGS: There is no  evidence of fracture or dislocation. Extensive subcutaneous emphysema involving the mid and hindfoot extending into the partially imaged distal lower leg. Charcot joint of the hindfoot. IMPRESSION: 1. Extensive subcutaneous emphysema involving the mid and hindfoot extending into the partially imaged distal lower leg. 2. Charcot joint of the hindfoot. Electronically Signed   By: Limin  Xu M.D.   On: 07/22/2023 20:13    Review of Systems  Constitutional: Negative.   HENT: Negative.    Respiratory: Negative.    Cardiovascular: Negative.   Gastrointestinal: Negative.   Genitourinary: Negative.   Musculoskeletal:        Left leg wound, swelling and blistering  Skin:  Positive for wound.   BP 178/87, HR 107 Physical Exam HENT:     Head: Atraumatic.     Mouth/Throat:     Mouth: Mucous membranes are moist.  Eyes:     Extraocular Movements: Extraocular movements intact.  Cardiovascular:     Rate and Rhythm: Tachycardia present.  Pulmonary:     Effort: Pulmonary effort is normal.  Abdominal:     Palpations: Abdomen is soft.  Musculoskeletal:     Comments: Left lower extremity with significant swelling and blistering about the foot and ankle.  There is an ulceration with surrounding swelling and blistering on the bottom of the foot as well with foot deformity consistent with Charcot arthropathy.  Patient has some crepitance to palpation along the anterior aspect of the ankle near the blistering and drainage.  There is foul odor consistent with wet gangrene.  The swelling extends up to the mid calf but no further.  He has no tenderness to palpation along the posterior aspect of the leg, knee, thigh.  No crepitance noted in these areas.  He tolerates knee and hip  range of motion.  No streaking erythema up the leg past the mid tibia, knee or thigh.  Skin:    Comments: There is significant blistering along the anterior lateral aspect of the ankle and foot.  He has an ulceration that is chronic  appearing on the plantar aspect of the foot with swelling surrounding it.  There is blistering along the medial aspect of the foot as well  Neurological:     Mental Status: He is alert.     Sensory: Sensory deficit present.  Psychiatric:        Mood and Affect: Mood normal.     Assessment/Plan: Patient has wet gangrene of the foot and ankle in the setting of chronic plantar ulceration, diabetes with neuropathy and left transverse tarsal Charcot arthropathy.  He has some area of necrosis on the top of his foot and lateral ankle which seems to be due to blistering but may be due to perfusion issue.  Patient states that the swelling, redness and area of darkened skin has not progressed since Monday and he has no systemic symptoms currently.  Patient denies having a fever or any other systemic symptoms of illness at all since the onset and discovery of the wound.    He is currently not in extremis.  His vital signs are stable.  He is alert and without systemic symptoms.  Given this we will not proceed with emergent amputation and will plan for admission to medicine to see how he responds to the antibiotics.  I did discuss with the patient that this is a limb threatening infection and he will at minimum require a below the knee amputation.  His CT scan did show some soft tissue gas posteriorly about the leg and thigh however he has no symptoms in this area and there is no palpable crepitance and no pain.  Given that he has no pain, no pain to palpation and no crepitance or any symptoms at all in these areas I am not concerned that this is due to necrotizing fasciitis.  This could be due to the fact that he has had an open ulceration on the plantar aspect of his foot for nearly 6 years off-and-on.  CT does not demonstrate any abscess or obvious fluid collection about the leg, knee or thigh.    I am hopeful that the cellulitis type symptoms more proximally will improve with antibiotics and he would be  amenable for below the knee amputation however given the extent of infection in the soft tissue's he may require a higher level of amputation.  Will discuss this case with other providers in the morning for advice.  ABI has been ordered by the emergency department per infection protocol.  He had an ABI done a few years ago that was normal.  Keep patient n.p.o. for now.  Donnamarie Gables 07/22/2023, 8:53 PM

## 2023-07-22 NOTE — ED Notes (Signed)
 Admit MD was at the bedside, went in, pt is having bloody drainage from the left lateral foot area. Gauze and kerlix applied.

## 2023-07-22 NOTE — ED Triage Notes (Signed)
 Patient arrives for eval of wound to L foot. Hx DM 2 and noticed this wound for the first time on Friday when someone was re-dressing the wound and stated it was black at that time. Foul odor and drainage from same. Hypotensive in triage. Complains of generalized weakness.

## 2023-07-23 ENCOUNTER — Telehealth (HOSPITAL_COMMUNITY): Payer: Self-pay | Admitting: Pharmacy Technician

## 2023-07-23 ENCOUNTER — Inpatient Hospital Stay (HOSPITAL_COMMUNITY): Admitting: Certified Registered Nurse Anesthetist

## 2023-07-23 ENCOUNTER — Encounter (HOSPITAL_COMMUNITY)

## 2023-07-23 ENCOUNTER — Encounter (HOSPITAL_COMMUNITY): Payer: Self-pay | Admitting: Internal Medicine

## 2023-07-23 ENCOUNTER — Other Ambulatory Visit: Payer: Self-pay

## 2023-07-23 ENCOUNTER — Other Ambulatory Visit (HOSPITAL_COMMUNITY): Payer: Self-pay

## 2023-07-23 ENCOUNTER — Encounter (HOSPITAL_COMMUNITY)
Admission: EM | Disposition: A | Discharge status: Other Acute Inpatient Hospital | Payer: Self-pay | Source: Home / Self Care | Attending: Family Medicine

## 2023-07-23 DIAGNOSIS — S88119A Complete traumatic amputation at level between knee and ankle, unspecified lower leg, initial encounter: Secondary | ICD-10-CM | POA: Diagnosis present

## 2023-07-23 DIAGNOSIS — M726 Necrotizing fasciitis: Secondary | ICD-10-CM | POA: Diagnosis not present

## 2023-07-23 DIAGNOSIS — I10 Essential (primary) hypertension: Secondary | ICD-10-CM | POA: Diagnosis not present

## 2023-07-23 DIAGNOSIS — E119 Type 2 diabetes mellitus without complications: Secondary | ICD-10-CM | POA: Diagnosis not present

## 2023-07-23 DIAGNOSIS — E871 Hypo-osmolality and hyponatremia: Secondary | ICD-10-CM | POA: Diagnosis not present

## 2023-07-23 DIAGNOSIS — M14672 Charcot's joint, left ankle and foot: Secondary | ICD-10-CM

## 2023-07-23 DIAGNOSIS — N179 Acute kidney failure, unspecified: Secondary | ICD-10-CM | POA: Insufficient documentation

## 2023-07-23 DIAGNOSIS — E1165 Type 2 diabetes mellitus with hyperglycemia: Secondary | ICD-10-CM | POA: Diagnosis not present

## 2023-07-23 DIAGNOSIS — Z794 Long term (current) use of insulin: Secondary | ICD-10-CM

## 2023-07-23 DIAGNOSIS — E785 Hyperlipidemia, unspecified: Secondary | ICD-10-CM | POA: Insufficient documentation

## 2023-07-23 HISTORY — PX: AMPUTATION: SHX166

## 2023-07-23 LAB — BASIC METABOLIC PANEL WITH GFR
Anion gap: 14 (ref 5–15)
Anion gap: 13 (ref 5–15)
BUN: 61 mg/dL — ABNORMAL HIGH (ref 6–20)
BUN: 57 mg/dL — ABNORMAL HIGH (ref 6–20)
CO2: 19 mmol/L — ABNORMAL LOW (ref 22–32)
CO2: 20 mmol/L — ABNORMAL LOW (ref 22–32)
Calcium: 8.1 mg/dL — ABNORMAL LOW (ref 8.9–10.3)
Calcium: 8.1 mg/dL — ABNORMAL LOW (ref 8.9–10.3)
Chloride: 101 mmol/L (ref 98–111)
Chloride: 102 mmol/L (ref 98–111)
Creatinine, Ser: 1.93 mg/dL — ABNORMAL HIGH (ref 0.61–1.24)
Creatinine, Ser: 1.84 mg/dL — ABNORMAL HIGH (ref 0.61–1.24)
GFR, Estimated: 40 mL/min — ABNORMAL LOW (ref >60)
GFR, Estimated: 42 mL/min — ABNORMAL LOW (ref >60)
Glucose, Bld: 325 mg/dL — ABNORMAL HIGH (ref 70–99)
Glucose, Bld: 170 mg/dL — ABNORMAL HIGH (ref 70–99)
Potassium: 3.2 mmol/L — ABNORMAL LOW (ref 3.5–5.1)
Potassium: 3.7 mmol/L (ref 3.5–5.1)
Sodium: 134 mmol/L — ABNORMAL LOW (ref 135–145)
Sodium: 135 mmol/L (ref 135–145)

## 2023-07-23 LAB — POCT I-STAT 7, (LYTES, BLD GAS, ICA,H+H)
Acid-base deficit: 6 mmol/L — ABNORMAL HIGH (ref 0.0–2.0)
Bicarbonate: 18.9 mmol/L — ABNORMAL LOW (ref 20.0–28.0)
Calcium, Ion: 1.12 mmol/L — ABNORMAL LOW (ref 1.15–1.40)
HCT: 26 % — ABNORMAL LOW (ref 39.0–52.0)
Hemoglobin: 8.8 g/dL — ABNORMAL LOW (ref 13.0–17.0)
O2 Saturation: 99 %
Patient temperature: 36.4
Potassium: 4.1 mmol/L (ref 3.5–5.1)
Sodium: 134 mmol/L — ABNORMAL LOW (ref 135–145)
TCO2: 20 mmol/L — ABNORMAL LOW (ref 22–32)
pCO2 arterial: 32 mmHg (ref 32–48)
pH, Arterial: 7.376 (ref 7.35–7.45)
pO2, Arterial: 135 mmHg — ABNORMAL HIGH (ref 83–108)

## 2023-07-23 LAB — CBG MONITORING, ED
Glucose-Capillary: 136 mg/dL — ABNORMAL HIGH (ref 70–99)
Glucose-Capillary: 137 mg/dL — ABNORMAL HIGH (ref 70–99)
Glucose-Capillary: 145 mg/dL — ABNORMAL HIGH (ref 70–99)
Glucose-Capillary: 149 mg/dL — ABNORMAL HIGH (ref 70–99)
Glucose-Capillary: 153 mg/dL — ABNORMAL HIGH (ref 70–99)
Glucose-Capillary: 185 mg/dL — ABNORMAL HIGH (ref 70–99)
Glucose-Capillary: 240 mg/dL — ABNORMAL HIGH (ref 70–99)
Glucose-Capillary: 316 mg/dL — ABNORMAL HIGH (ref 70–99)

## 2023-07-23 LAB — CBC WITH DIFFERENTIAL/PLATELET
Abs Immature Granulocytes: 0 10*3/uL (ref 0.00–0.07)
Basophils Absolute: 0 10*3/uL (ref 0.0–0.1)
Basophils Relative: 0 %
Eosinophils Absolute: 0.4 10*3/uL (ref 0.0–0.5)
Eosinophils Relative: 1 %
HCT: 27.1 % — ABNORMAL LOW (ref 39.0–52.0)
Hemoglobin: 9.4 g/dL — ABNORMAL LOW (ref 13.0–17.0)
Lymphocytes Relative: 3 %
Lymphs Abs: 1.2 10*3/uL (ref 0.7–4.0)
MCH: 29.5 pg (ref 26.0–34.0)
MCHC: 34.7 g/dL (ref 30.0–36.0)
MCV: 85 fL (ref 80.0–100.0)
Monocytes Absolute: 1.2 10*3/uL — ABNORMAL HIGH (ref 0.1–1.0)
Monocytes Relative: 3 %
Neutro Abs: 36 10*3/uL — ABNORMAL HIGH (ref 1.7–7.7)
Neutrophils Relative %: 93 %
Platelets: 436 10*3/uL — ABNORMAL HIGH (ref 150–400)
RBC: 3.19 MIL/uL — ABNORMAL LOW (ref 4.22–5.81)
RDW: 13.8 % (ref 11.5–15.5)
WBC: 38.7 10*3/uL — ABNORMAL HIGH (ref 4.0–10.5)
nRBC: 0 % (ref 0.0–0.2)
nRBC: 0 /100{WBCs}

## 2023-07-23 LAB — CK: Total CK: 29 U/L — ABNORMAL LOW (ref 49–397)

## 2023-07-23 LAB — CBC
HCT: 27.4 % — ABNORMAL LOW (ref 39.0–52.0)
Hemoglobin: 9.5 g/dL — ABNORMAL LOW (ref 13.0–17.0)
MCH: 29.5 pg (ref 26.0–34.0)
MCHC: 34.7 g/dL (ref 30.0–36.0)
MCV: 85.1 fL (ref 80.0–100.0)
Platelets: 428 10*3/uL — ABNORMAL HIGH (ref 150–400)
RBC: 3.22 MIL/uL — ABNORMAL LOW (ref 4.22–5.81)
RDW: 13.7 % (ref 11.5–15.5)
WBC: 30.3 10*3/uL — ABNORMAL HIGH (ref 4.0–10.5)
nRBC: 0 % (ref 0.0–0.2)

## 2023-07-23 LAB — HEMOGLOBIN A1C
Hgb A1c MFr Bld: 6.7 % — ABNORMAL HIGH (ref 4.8–5.6)
Mean Plasma Glucose: 145.59 mg/dL

## 2023-07-23 LAB — URINALYSIS, W/ REFLEX TO CULTURE (INFECTION SUSPECTED)
Bilirubin Urine: NEGATIVE
Glucose, UA: 150 mg/dL — AB
Ketones, ur: NEGATIVE mg/dL
Leukocytes,Ua: NEGATIVE
Nitrite: NEGATIVE
Protein, ur: 30 mg/dL — AB
Specific Gravity, Urine: 1.023 (ref 1.005–1.030)
pH: 5 (ref 5.0–8.0)

## 2023-07-23 LAB — GLUCOSE, CAPILLARY
Glucose-Capillary: 188 mg/dL — ABNORMAL HIGH (ref 70–99)
Glucose-Capillary: 214 mg/dL — ABNORMAL HIGH (ref 70–99)
Glucose-Capillary: 224 mg/dL — ABNORMAL HIGH (ref 70–99)
Glucose-Capillary: 216 mg/dL — ABNORMAL HIGH (ref 70–99)

## 2023-07-23 LAB — ABO/RH: ABO/RH(D): A POS

## 2023-07-23 LAB — I-STAT CG4 LACTIC ACID, ED: Lactic Acid, Venous: 1.3 mmol/L (ref 0.5–1.9)

## 2023-07-23 LAB — PREPARE RBC (CROSSMATCH)

## 2023-07-23 LAB — TROPONIN I (HIGH SENSITIVITY)
Troponin I (High Sensitivity): 28 ng/L — ABNORMAL HIGH (ref <18)
Troponin I (High Sensitivity): 17 ng/L (ref <18)

## 2023-07-23 LAB — HIV ANTIBODY (ROUTINE TESTING W REFLEX): HIV Screen 4th Generation wRfx: NONREACTIVE

## 2023-07-23 LAB — CREATININE, SERUM
Creatinine, Ser: 1.74 mg/dL — ABNORMAL HIGH (ref 0.61–1.24)
GFR, Estimated: 45 mL/min — ABNORMAL LOW (ref >60)

## 2023-07-23 SURGERY — AMPUTATION, ABOVE KNEE
Anesthesia: General | Site: Knee | Laterality: Left

## 2023-07-23 MED ORDER — ORAL CARE MOUTH RINSE: 15.0000 mL | Freq: Once | OROMUCOSAL | Status: AC

## 2023-07-23 MED ORDER — LACTATED RINGERS IV SOLN: INTRAVENOUS | Status: DC

## 2023-07-23 MED ORDER — FENTANYL CITRATE (PF) 100 MCG/2ML IJ SOLN
INTRAMUSCULAR | Status: AC
  Filled 2023-07-23: qty 2

## 2023-07-23 MED ORDER — OXYCODONE HCL 5 MG PO TABS
5.0000 mg | ORAL_TABLET | ORAL | Status: DC | PRN
  Administered 2023-07-27 – 2023-07-29 (×2): 10 mg via ORAL
  Filled 2023-07-23 (×5): qty 2

## 2023-07-23 MED ORDER — CHLORHEXIDINE GLUCONATE 0.12 % MT SOLN
15.0000 mL | Freq: Once | OROMUCOSAL | Status: AC
  Administered 2023-07-23: 15 mL via OROMUCOSAL

## 2023-07-23 MED ORDER — LIDOCAINE 2% (20 MG/ML) 5 ML SYRINGE
INTRAMUSCULAR | Status: DC | PRN
Start: 2023-07-23 — End: 2023-07-23
  Administered 2023-07-23: 60 mg via INTRAVENOUS

## 2023-07-23 MED ORDER — HEPARIN SODIUM (PORCINE) 5000 UNIT/ML IJ SOLN
5000.0000 [IU] | Freq: Three times a day (TID) | INTRAMUSCULAR | Status: DC
  Administered 2023-07-23 – 2023-07-30 (×20): 5000 [IU] via SUBCUTANEOUS
  Filled 2023-07-23 (×20): qty 1

## 2023-07-23 MED ORDER — ALBUMIN HUMAN 5 % IV SOLN: INTRAVENOUS | Status: DC | PRN

## 2023-07-23 MED ORDER — OXYCODONE HCL 5 MG PO TABS
10.0000 mg | ORAL_TABLET | ORAL | Status: DC | PRN
  Administered 2023-07-23 – 2023-07-24 (×2): 10 mg via ORAL
  Administered 2023-07-25 – 2023-07-26 (×2): 15 mg via ORAL
  Administered 2023-07-28: 10 mg via ORAL
  Filled 2023-07-23 (×2): qty 3
  Filled 2023-07-23: qty 2

## 2023-07-23 MED ORDER — LACTATED RINGERS IV SOLN
INTRAVENOUS | Status: DC | PRN
Start: 2023-07-23 — End: 2023-07-23

## 2023-07-23 MED ORDER — VANCOMYCIN HCL 1000 MG IV SOLR
INTRAVENOUS | Status: DC | PRN
  Administered 2023-07-23: 1000 mg

## 2023-07-23 MED ORDER — SODIUM CHLORIDE 0.9 % IV SOLN: INTRAVENOUS | Status: AC

## 2023-07-23 MED ORDER — VITAMIN C 500 MG PO TABS
1000.0000 mg | ORAL_TABLET | Freq: Every day | ORAL | Status: DC
  Administered 2023-07-23 – 2023-07-30 (×7): 1000 mg via ORAL
  Filled 2023-07-23 (×7): qty 2

## 2023-07-23 MED ORDER — HYDROMORPHONE HCL 1 MG/ML IJ SOLN: 0.5000 mg | INTRAMUSCULAR | Status: DC | PRN

## 2023-07-23 MED ORDER — VANCOMYCIN HCL 1000 MG IV SOLR
INTRAVENOUS | Status: AC
  Filled 2023-07-23: qty 20

## 2023-07-23 MED ORDER — MIDAZOLAM HCL 2 MG/2ML IJ SOLN
INTRAMUSCULAR | Status: AC
Start: 2023-07-23 — End: 2023-07-23
  Filled 2023-07-23: qty 2

## 2023-07-23 MED ORDER — INSULIN ASPART 100 UNIT/ML IJ SOLN
0.0000 [IU] | Freq: Three times a day (TID) | INTRAMUSCULAR | Status: DC
Start: 2023-07-23 — End: 2023-07-24
  Administered 2023-07-24: 1 [IU] via SUBCUTANEOUS
  Administered 2023-07-24: 2 [IU] via SUBCUTANEOUS

## 2023-07-23 MED ORDER — ETOMIDATE 2 MG/ML IV SOLN
INTRAVENOUS | Status: DC | PRN
  Administered 2023-07-23: 12 mg via INTRAVENOUS

## 2023-07-23 MED ORDER — POTASSIUM CHLORIDE 10 MEQ/100ML IV SOLN
10.0000 meq | INTRAVENOUS | Status: DC
Start: 2023-07-23 — End: 2023-07-23

## 2023-07-23 MED ORDER — SUCCINYLCHOLINE CHLORIDE 200 MG/10ML IV SOSY
PREFILLED_SYRINGE | INTRAVENOUS | Status: DC | PRN
Start: 2023-07-23 — End: 2023-07-23
  Administered 2023-07-23: 140 mg via INTRAVENOUS

## 2023-07-23 MED ORDER — ONDANSETRON HCL 4 MG/2ML IJ SOLN
INTRAMUSCULAR | Status: DC | PRN
  Administered 2023-07-23: 4 mg via INTRAVENOUS

## 2023-07-23 MED ORDER — MIDAZOLAM HCL 2 MG/2ML IJ SOLN
INTRAMUSCULAR | Status: DC | PRN
  Administered 2023-07-23: 2 mg via INTRAVENOUS

## 2023-07-23 MED ORDER — FENTANYL CITRATE (PF) 250 MCG/5ML IJ SOLN
INTRAMUSCULAR | Status: DC | PRN
  Administered 2023-07-23 (×2): 50 ug via INTRAVENOUS

## 2023-07-23 MED ORDER — PHENYLEPHRINE 80 MCG/ML (10ML) SYRINGE FOR IV PUSH (FOR BLOOD PRESSURE SUPPORT)
PREFILLED_SYRINGE | INTRAVENOUS | Status: DC | PRN
  Administered 2023-07-23 (×2): 80 ug via INTRAVENOUS

## 2023-07-23 MED ORDER — ZINC SULFATE 220 (50 ZN) MG PO CAPS
220.0000 mg | ORAL_CAPSULE | Freq: Every day | ORAL | Status: DC
Start: 2023-07-23 — End: 2023-08-06
  Administered 2023-07-23 – 2023-07-30 (×7): 220 mg via ORAL
  Filled 2023-07-23 (×7): qty 1

## 2023-07-23 MED ORDER — PHENYLEPHRINE HCL-NACL 20-0.9 MG/250ML-% IV SOLN
INTRAVENOUS | Status: DC | PRN
  Administered 2023-07-23: 30 ug/min via INTRAVENOUS

## 2023-07-23 MED ORDER — ACETAMINOPHEN 325 MG PO TABS: 325.0000 mg | ORAL_TABLET | Freq: Four times a day (QID) | ORAL | Status: DC | PRN

## 2023-07-23 MED ORDER — POTASSIUM CHLORIDE 10 MEQ/100ML IV SOLN
10.0000 meq | INTRAVENOUS | Status: AC
  Administered 2023-07-23 (×4): 10 meq via INTRAVENOUS
  Filled 2023-07-23 (×4): qty 100

## 2023-07-23 MED ORDER — JUVEN PO PACK
1.0000 | PACK | Freq: Two times a day (BID) | ORAL | Status: DC
Start: 2023-07-24 — End: 2023-07-30
  Administered 2023-07-24 – 2023-07-30 (×12): 1 via ORAL
  Filled 2023-07-23 (×12): qty 1

## 2023-07-23 MED ORDER — SODIUM CHLORIDE 0.9 % IV SOLN
INTRAVENOUS | Status: DC
Start: 2023-07-23 — End: 2023-07-30

## 2023-07-23 MED ORDER — FENTANYL CITRATE (PF) 250 MCG/5ML IJ SOLN
INTRAMUSCULAR | Status: AC
  Filled 2023-07-23: qty 5

## 2023-07-23 MED ORDER — VASHE WOUND IRRIGATION OPTIME
TOPICAL | Status: DC | PRN
Start: 2023-07-23 — End: 2023-07-23
  Administered 2023-07-23 (×4): 34 [oz_av]

## 2023-07-23 SURGICAL SUPPLY — 40 items
BAG COUNTER SPONGE SURGICOUNT (BAG) IMPLANT
BLADE SAW RECIP 87.9 MT (BLADE) ×1 IMPLANT
BLADE SURG 21 STRL SS (BLADE) ×1 IMPLANT
BNDG COHESIVE 6X5 TAN NS LF (GAUZE/BANDAGES/DRESSINGS) IMPLANT
BNDG COHESIVE 6X5 TAN ST LF (GAUZE/BANDAGES/DRESSINGS) ×1 IMPLANT
CANISTER PREVENA PLUS 150 (CANNISTER) IMPLANT
CANISTER WOUND CARE 500ML ATS (WOUND CARE) IMPLANT
CANISTER WOUNDNEG PRESSURE 500 (CANNISTER) IMPLANT
CNTNR URN SCR LID CUP LEK RST (MISCELLANEOUS) IMPLANT
COVER SURGICAL LIGHT HANDLE (MISCELLANEOUS) ×1 IMPLANT
CUFF TRNQT CYL 34X4.125X (TOURNIQUET CUFF) IMPLANT
DRAPE INCISE IOBAN 66X45 STRL (DRAPES) ×2 IMPLANT
DRAPE U-SHAPE 47X51 STRL (DRAPES) ×1 IMPLANT
DRESSING PREVENA PLUS CUSTOM (GAUZE/BANDAGES/DRESSINGS) ×1 IMPLANT
DRESSING VERAFLO CLEANS CC MED (GAUZE/BANDAGES/DRESSINGS) IMPLANT
DURAPREP 26ML APPLICATOR (WOUND CARE) ×1 IMPLANT
ELECTRODE REM PT RTRN 9FT ADLT (ELECTROSURGICAL) ×1 IMPLANT
GLOVE BIOGEL PI IND STRL 7.5 (GLOVE) ×1 IMPLANT
GLOVE BIOGEL PI IND STRL 9 (GLOVE) ×1 IMPLANT
GLOVE SURG ORTHO 9.0 STRL STRW (GLOVE) ×1 IMPLANT
GLOVE SURG SS PI 6.5 STRL IVOR (GLOVE) ×1 IMPLANT
GOWN STRL REUS W/ TWL LRG LVL3 (GOWN DISPOSABLE) ×1 IMPLANT
GOWN STRL REUS W/ TWL XL LVL3 (GOWN DISPOSABLE) ×2 IMPLANT
GRAFT SKIN WND SURGICLOSE M95 (Tissue) IMPLANT
KIT BASIN OR (CUSTOM PROCEDURE TRAY) ×1 IMPLANT
KIT TURNOVER KIT B (KITS) ×1 IMPLANT
MANIFOLD NEPTUNE II (INSTRUMENTS) ×1 IMPLANT
NS IRRIG 1000ML POUR BTL (IV SOLUTION) ×1 IMPLANT
PACK ORTHO EXTREMITY (CUSTOM PROCEDURE TRAY) ×1 IMPLANT
PAD ARMBOARD POSITIONER FOAM (MISCELLANEOUS) ×1 IMPLANT
PAD NEG PRESSURE SENSATRAC (MISCELLANEOUS) IMPLANT
PREVENA RESTOR ARTHOFORM 46X30 (CANNISTER) ×1 IMPLANT
STAPLER SKIN PROX 35W (STAPLE) IMPLANT
STOCKINETTE IMPERVIOUS 9X36 MD (GAUZE/BANDAGES/DRESSINGS) IMPLANT
STOCKINETTE IMPERVIOUS LG (DRAPES) IMPLANT
SUT ETHILON 2 0 PSLX (SUTURE) ×2 IMPLANT
SUT SILK 2-0 18XBRD TIE 12 (SUTURE) ×1 IMPLANT
TOWEL GREEN STERILE FF (TOWEL DISPOSABLE) ×1 IMPLANT
TUBE CONNECTING 20X1/4 (TUBING) ×1 IMPLANT
YANKAUER SUCT BULB TIP NO VENT (SUCTIONS) ×1 IMPLANT

## 2023-07-23 NOTE — Progress Notes (Addendum)
 PROGRESS NOTE    Scott Navarro.  ZOX:[id number] DOB: 1965-05-17 DOA: 07/22/2023 PCP: Claudene Crystal, PA-C   Brief Narrative:  This 58 y.o. male with history of diabetes mellitus type 2 presents to the ED because of worsening wound on the left leg.  Patient states about 4 to 5 days ago he noticed small discoloration on the lateral aspect of his left foot which has rapidly progressed involving the whole left foot with discharge and blebs. He denies any recent trauma or insect bites.  Denies fever or chills.  Patient states over the last 1 week he has been feeling poorly with weakness and shortness of breath and some chest tightness. CT scan shows extensive gas formation in the entire left lower extremity and foot.  Dr. Hulda Mage orthopedic surgeon was consulted.  Initial plan was to take patient to OR in the morning, but later decided to continue aggressive IV antibiotics. Final plan will be decided today.  Patient was admitted for further evaluation.  Assessment & Plan:   Principal Problem:   Necrotizing fasciitis (HCC) Active Problems:   Hypokalemia   Type 2 diabetes mellitus with hyperglycemia (HCC)   Charcot joint of left foot   Anemia   Hyponatremia   ARF (acute renal failure) (HCC)   Hyperlipidemia  Necrotizing fasciitis of the left lower extremity : Patient presented with LLE cellulitis, necrosis , foul smelling discharge. Appreciate orthopedic surgery consult. Initial Plan is to take patient to the OR in the morning.  Continue IV Zyvox and Zosyn.  Follow cultures,  Continue hydration , adequate pain control.  Diabetes mellitus type 2: He takes Mounjaro  at home.  Last hemoglobin A1c was 6.7 a month ago.   Given the acuity of patient's condition , started patient on insulin  infusion. We will switch to home insulin  dosing.  Essential Hypertension: He takes ARB.  Will keep patient on as needed IV hydralazine for now. Will hold ARB due to AKI.  Chronic Anemia: Appears to be  chronic follow CBC.  Hyperlipidemia : Continue Statins  Acute kidney injury likely from sepsis: Closely monitor intake/ output charting,  metabolic panel. Hold ARB,  continue hydration.  Monitor renal functions  Hypokalemia: Replaced, Continue to monitor  Prolonged Qtc:  replace electrolytes and recheck EKG.  Leukocytosis: Likely secondary to infection. Continue to monitor  Elevated troponin secondary demand ischemia: Continue to trend troponin.  DVT prophylaxis: heparin Code Status: Full code Family Communication: No family at bed side. Disposition Plan:    Status is: Inpatient Remains inpatient appropriate because: Admitted for necrotizing fascitis left lower extremity    Consultants:  Orthopeadics  Procedures: None Antimicrobials: Anti-infectives (From admission, onward)    Start     Dose/Rate Route Frequency Ordered Stop   07/23/23 1000  linezolid (ZYVOX) IVPB 600 mg        600 mg 300 mL/hr over 60 Minutes Intravenous Every 12 hours 07/22/23 2324     07/23/23 0400  piperacillin-tazobactam (ZOSYN) IVPB 3.375 g        3.375 g 12.5 mL/hr over 240 Minutes Intravenous Every 8 hours 07/22/23 2323     07/22/23 1930  piperacillin-tazobactam (ZOSYN) IVPB 3.375 g        3.375 g 100 mL/hr over 30 Minutes Intravenous  Once 07/22/23 1922 07/22/23 2110   07/22/23 1930  linezolid (ZYVOX) IVPB 600 mg        600 mg 300 mL/hr over 60 Minutes Intravenous  Once 07/22/23 1922 07/22/23 2200  Subjective: Patient was seen and examined at bedside.  Overnight events noted. Patient remains in bed,  there is significant foul smell inside the room from the wound. Patient states orthopedics has told him about continuation of IV antibiotics if there is no improvement then they will consider surgery.  Objective: Vitals:   07/23/23 0415 07/23/23 0430 07/23/23 0730 07/23/23 0817  BP: (!) 147/69 (!) 146/68 112/68   Pulse: 99 95 86   Resp: 17 20 19    Temp:    97.9 F (36.6 C)   TempSrc:    Oral  SpO2: 100% 100% 100%     Intake/Output Summary (Last 24 hours) at 07/23/2023 1006 Last data filed at 07/23/2023 8469 Gross per 24 hour  Intake 2979.36 ml  Output 300 ml  Net 2679.36 ml   There were no vitals filed for this visit.  Examination:  General exam: Appears calm and comfortable, not in any acute distress. Respiratory system: Clear to auscultation. Respiratory effort normal.  RR 15 Cardiovascular system: S1 & S2 heard, RRR. No JVD, murmurs, rubs, gallops or clicks. No pedal edema. Gastrointestinal system: Abdomen is non distended, soft and non tender. Normal bowel sounds heard. Central nervous system: Alert and oriented x 3. No focal neurological deficits. Extremities: Left foot appears swollen with necrosis, wet gangrene. Skin: No rashes, lesions or ulcers Psychiatry: Judgement and insight appear normal. Mood & affect appropriate.     Data Reviewed: I have personally reviewed following labs and imaging studies  CBC: Recent Labs  Lab 07/22/23 1950 07/22/23 1952 07/23/23 0410  WBC  --  44.6* 38.7*  NEUTROABS  --  39.8* 36.0*  HGB 11.9* 11.0* 9.4*  HCT 35.0* 32.5* 27.1*  MCV  --  85.5 85.0  PLT  --  512* 436*   Basic Metabolic Panel: Recent Labs  Lab 07/22/23 1950 07/22/23 1952 07/23/23 0041 07/23/23 0410  NA 135 134* 134* 135  K 3.4* 3.3* 3.2* 3.7  CL 99 99 101 102  CO2  --  23 19* 20*  GLUCOSE 337* 341* 325* 170*  BUN 60* 67* 61* 57*  CREATININE 2.30* 2.34* 1.93* 1.84*  CALCIUM   --  8.4* 8.1* 8.1*   GFR: CrCl cannot be calculated (Unknown ideal weight.). Liver Function Tests: Recent Labs  Lab 07/22/23 1952  AST 63*  ALT 42  ALKPHOS 204*  BILITOT 1.0  PROT 7.0  ALBUMIN 2.0*   No results for input(s): "LIPASE", "AMYLASE" in the last 168 hours. No results for input(s): "AMMONIA" in the last 168 hours. Coagulation Profile: Recent Labs  Lab 07/22/23 1952  INR 1.2   Cardiac Enzymes: Recent Labs  Lab 07/23/23 0410   CKTOTAL 29*   BNP (last 3 results) No results for input(s): "PROBNP" in the last 8760 hours. HbA1C: Recent Labs    07/23/23 0534  HGBA1C 6.7*   CBG: Recent Labs  Lab 07/23/23 0257 07/23/23 0406 07/23/23 0508 07/23/23 0626 07/23/23 0834  GLUCAP 185* 153* 136* 149* 145*   Lipid Profile: No results for input(s): "CHOL", "HDL", "LDLCALC", "TRIG", "CHOLHDL", "LDLDIRECT" in the last 72 hours. Thyroid Function Tests: No results for input(s): "TSH", "T4TOTAL", "FREET4", "T3FREE", "THYROIDAB" in the last 72 hours. Anemia Panel: No results for input(s): "VITAMINB12", "FOLATE", "FERRITIN", "TIBC", "IRON", "RETICCTPCT" in the last 72 hours. Sepsis Labs: Recent Labs  Lab 07/22/23 1951 07/23/23 0047  LATICACIDVEN 1.3 1.3    Recent Results (from the past 240 hours)  Resp panel by RT-PCR (RSV, Flu A&B, Covid) Anterior Nasal Swab  Status: None   Collection Time: 07/22/23  7:22 PM   Specimen: Anterior Nasal Swab  Result Value Ref Range Status   SARS Coronavirus 2 by RT PCR NEGATIVE NEGATIVE Final   Influenza A by PCR NEGATIVE NEGATIVE Final   Influenza B by PCR NEGATIVE NEGATIVE Final    Comment: (NOTE) The Xpert Xpress SARS-CoV-2/FLU/RSV plus assay is intended as an aid in the diagnosis of influenza from Nasopharyngeal swab specimens and should not be used as a sole basis for treatment. Nasal washings and aspirates are unacceptable for Xpert Xpress SARS-CoV-2/FLU/RSV testing.  Fact Sheet for Patients: BloggerCourse.com  Fact Sheet for Healthcare Providers: SeriousBroker.it  This test is not yet approved or cleared by the [place]  FDA and has been authorized for detection and/or diagnosis of SARS-CoV-2 by FDA under an Emergency Use Authorization (EUA). This EUA will remain in effect (meaning this test can be used) for the duration of the COVID-19 declaration under Section 564(b)(1) of the Act, 21 U.S.C. section  360bbb-3(b)(1), unless the authorization is terminated or revoked.     Resp Syncytial Virus by PCR NEGATIVE NEGATIVE Final    Comment: (NOTE) Fact Sheet for Patients: BloggerCourse.com  Fact Sheet for Healthcare Providers: SeriousBroker.it  This test is not yet approved or cleared by the [place]  FDA and has been authorized for detection and/or diagnosis of SARS-CoV-2 by FDA under an Emergency Use Authorization (EUA). This EUA will remain in effect (meaning this test can be used) for the duration of the COVID-19 declaration under Section 564(b)(1) of the Act, 21 U.S.C. section 360bbb-3(b)(1), unless the authorization is terminated or revoked.  Performed at [facility], [address] [place], [place] [address]   Blood Culture (routine x 2)     Status: None (Preliminary result)   Collection Time: 07/22/23  7:45 PM   Specimen: BLOOD  Result Value Ref Range Status   Specimen Description BLOOD RIGHT ANTECUBITAL  Final   Special Requests   Final    BOTTLES DRAWN AEROBIC AND ANAEROBIC Blood Culture adequate volume   Culture   Final    NO GROWTH < 12 HOURS Performed at [facility], [address] [place], [place] [address]    Report Status PENDING  Incomplete  Blood Culture (routine x 2)     Status: None (Preliminary result)   Collection Time: 07/22/23  7:50 PM   Specimen: BLOOD LEFT FOREARM  Result Value Ref Range Status   Specimen Description BLOOD LEFT FOREARM  Final   Special Requests   Final    BOTTLES DRAWN AEROBIC AND ANAEROBIC Blood Culture adequate volume   Culture   Final    NO GROWTH < 12 HOURS Performed at [facility], [address], [place], [place] [address]    Report Status PENDING  Incomplete    Radiology Studies: CT EXTREMITY LOWER LEFT W CONTRAST Result Date: 07/22/2023 CLINICAL DATA:  Soft tissue infection suspected, lower leg, xray done EXAM: CT OF THE LOWER LEFT EXTREMITY WITH  CONTRAST TECHNIQUE: Multidetector CT imaging of the lower left extremity was performed according to the standard protocol following intravenous contrast administration. RADIATION DOSE REDUCTION: This exam was performed according to the departmental dose-optimization program which includes automated exposure control, adjustment of the mA and/or kV according to patient size and/or use of iterative reconstruction technique. CONTRAST:  75mL OMNIPAQUE IOHEXOL 350 MG/ML SOLN COMPARISON:  None Available. FINDINGS: Bones/Joint/Cartilage No acute bony abnormality. Specifically, no fracture, subluxation, or dislocation. No bone destruction  seen to suggest osteomyelitis. 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 Subchondral cystic change throughout the midfoot and hindfoot. Ligaments Suboptimally assessed by CT. Muscles and Tendons Unremarkable. Soft tissues Gas is noted throughout the soft tissues in the left foot and extends proximally throughout the soft tissues of the left calf. This also extends along the fascial planes in the region of the knee posteriorly and within the fascial planes adjacent to the hamstring muscles up into the proximal thigh. Diffuse calcifications throughout the superficial femoral artery and popliteal artery, but the vessels are patent. Trifurcation vessels are diseased but patent into the distal calf. IMPRESSION: Extensive soft tissue gas throughout the left leg and foot. No acute bony abnormality.  No fracture or bone destruction. Electronically Signed   By: Janeece Mechanic M.D.   On: 07/22/2023 20:32   DG Chest Port 1 View Result Date: 07/22/2023 CLINICAL DATA:  Sepsis EXAM: PORTABLE CHEST 1 VIEW COMPARISON:  Chest radiograph dated 12/19/2008 FINDINGS: Normal lung volumes. No focal consolidations. No pleural effusion or pneumothorax. The heart size and mediastinal contours are within normal limits. No acute osseous abnormality. IMPRESSION: No acute disease. Electronically  Signed   By: Limin  Xu M.D.   On: 07/22/2023 20:15   DG Foot 2 Views Left Result Date: 07/22/2023 CLINICAL DATA:  Left foot wound EXAM: LEFT FOOT - 2 VIEW COMPARISON:  None Available. FINDINGS: There is no evidence of fracture or dislocation. Extensive subcutaneous emphysema involving the mid and hindfoot extending into the partially imaged distal lower leg. Charcot joint of the hindfoot. IMPRESSION: 1. Extensive subcutaneous emphysema involving the mid and hindfoot extending into the partially imaged distal lower leg. 2. Charcot joint of the hindfoot. Electronically Signed   By: Limin  Xu M.D.   On: 07/22/2023 20:13   Scheduled Meds:  heparin  5,000 Units Subcutaneous Q8H   Continuous Infusions:  dextrose  5% lactated ringers  125 mL/hr at 07/23/23 0157   insulin  2 Units/hr (07/23/23 0836)   lactated ringers  Stopped (07/23/23 0049)   lactated ringers  Stopped (07/23/23 0156)   linezolid (ZYVOX) IV     piperacillin-tazobactam (ZOSYN)  IV Stopped (07/23/23 0935)     LOS: 1 day    Time spent: 50 mins    Magdalene School, MD [facility]   If 7PM-7AM, please contact night-coverage

## 2023-07-23 NOTE — Anesthesia Procedure Notes (Signed)
 Procedure Name: Intubation Date/Time: 07/23/2023 3:42 PM  Performed by: Pasty Bongo, CRNAPre-anesthesia Checklist: Patient identified, Emergency Drugs available, Suction available and Patient being monitored Patient Re-evaluated:Patient Re-evaluated prior to induction Oxygen Delivery Method: Circle System Utilized Preoxygenation: Pre-oxygenation with 100% oxygen Induction Type: IV induction and Rapid sequence Laryngoscope Size: Glidescope and 3 Grade View: Grade I Tube type: Oral Tube size: 7.5 mm Number of attempts: 1 Airway Equipment and Method: Rigid stylet Placement Confirmation: ETT inserted through vocal cords under direct vision, positive ETCO2 and breath sounds checked- equal and bilateral Secured at: 22 cm Tube secured with: Tape Dental Injury: Teeth and Oropharynx as per pre-operative assessment

## 2023-07-23 NOTE — Inpatient Diabetes Management (Signed)
 Inpatient Diabetes Program Recommendations  AACE/ADA: New Consensus Statement on Inpatient Glycemic Control (2015)  Target Ranges:  Prepandial:   less than 140 mg/dL      Peak postprandial:   less than 180 mg/dL (1-2 hours)      Critically ill patients:  140 - 180 mg/dL   Lab Results  Component Value Date   GLUCAP 137 (H) 07/23/2023   HGBA1C 6.7 (H) 07/23/2023    Review of Glycemic Control  Latest Reference Range & Units 07/23/23 02:57 07/23/23 04:06 07/23/23 05:08 07/23/23 06:26 07/23/23 08:34 07/23/23 10:43  Glucose-Capillary 70 - 99 mg/dL 161 (H) 096 (H) 045 (H) 149 (H) 145 (H) 137 (H)   Diabetes history: DM 2 Outpatient Diabetes medications: Mounjaro  15 mg weekly, Was on Humalog  sliding scale  before last PCP visit in April in case trends were not at goal, trends never went above goal so was d/c'd last visit Current orders for Inpatient glycemic control:  Novolog  0-6 units tid  Elevated renal function Baseline A1c prior to infection 6.7% infection increased glucose to current levels  Note: Glucose trends in 130's and 140's range. Required IV insulin  gtt 1.8-2.2 units an hour to maintain that level. May require basal insulin   Inpatient Diabetes Program Recommendations:    -   Start Semglee 10 units  At time of d/c place pt on at least Humalog  sliding scale and possibly basal insulin  depending on trends here in the hospital. Will follow pt.  Spoke with pt at bedside was on Humalog  sliding scale but it was discontinued last PCP visit due to never reaching outside of goal range with the FSL3 CGM and A1c was controlled. Pt reports trends only increased after getting the infection on his foot. Pt familiar with the operation of the insulin  pen and is ok with starting basal insulin , if needed, at time of discharge.   Thanks,  Eloise Hake RN, MSN, BC-ADM Inpatient Diabetes Coordinator Team Pager [phone] (8a-5p)

## 2023-07-23 NOTE — ED Notes (Addendum)
 2nd trop @0710 

## 2023-07-23 NOTE — Op Note (Signed)
 07/23/2023  4:49 PM  PATIENT:  Scott Navarro.    PRE-OPERATIVE DIAGNOSIS:  Necrotizing fasciitis left thigh left leg left foot.  POST-OPERATIVE DIAGNOSIS:  Same  PROCEDURE:  AMPUTATION, BELOW KNEE, LEFT Irrigation and debridement with fasciotomies medial and lateral thigh. Application of Kerecis micro graft 95 cm to cover a wound surface area greater than 500 cm. Application of wound VAC with cleanse choice sponges x 6.  SURGEON:  Timothy Ford, MD  PHYSICIAN ASSISTANT:None ANESTHESIA:   General  PREOPERATIVE INDICATIONS:  Scott Kenner. is a  58 y.o. male with a diagnosis of Necrotizing fasciotomy who failed conservative measures and elected for surgical management.    The risks benefits and alternatives were discussed with the patient preoperatively including but not limited to the risks of infection, bleeding, nerve injury, cardiopulmonary complications, the need for revision surgery, among others, and the patient was willing to proceed.  OPERATIVE IMPLANTS:   Implant Name Type Inv. Item Serial No. Manufacturer Lot No. LRB No. Used Action  GRAFT SKIN WND SURGICLOSE M95 - [id number] Tissue GRAFT SKIN WND SURGICLOSE M95  [employer] [id number] Left 1 Implanted    @ENCIMAGES @  OPERATIVE FINDINGS: Patient had necrotizing fasciitis with bubbling of dishwater fluid that extended up to the medial and lateral aspect of the thigh.  Tissue cultures were taken from the necrotic muscle at the level of transtibial amputation as well as medial and lateral thigh.  These were identified as 3 separate tissue cultures.  OPERATIVE PROCEDURE: Patient was brought the operating room and underwent a general anesthetic.  After adequate levels anesthesia obtained patient's left lower extremity was prepped using DuraPrep draped into a sterile field a timeout was called.  Patient had an arterial line inserted.  Patient received 1 unit of packed red blood cells as well as albumin.  A  transtibial potation was performed with a transverse incision 12 cm distal to the tibial tubercle Curr approximately a large posterior flap was created.  All of the muscle had poor color poor contractility and did not contract with electrocautery.  The vascular bundles were suture-ligated with 2-0 silk the sciatic nerve was pulled cut and allowed to retract.  Tissue from the transtibial amputation was sent for cultures.  Medial and lateral incisions were then carried up the medial and lateral aspect of the thigh.  The fascia was excised medially and laterally.  There is bubbling dishwater fluid both medially and laterally.  The fascia and fluid was sent for cultures.  Once the tissue margins appeared clear there was improved color in the muscle.  There was minimal contractility both the medial lateral aspect of the thigh.  The wound was irrigated with Vashe x 3 bottles.  The soft tissue was then covered with 95 cm of Kerecis micro graft with 1 g vancomycin powder.  This cover the wound surface area greater than 500 cm for the 95 cm Kerecis micro graft.  The wound VAC sponges were then placed in the medial lateral thigh wounds as well as distally of the transtibial amputation and the skin was closed over the wound VAC sponges.  This was secured with Ioban and derma tack.  This was connected to negative pressure and had a good suction fit.  Patient was extubated taken the PACU in stable condition.   DISCHARGE PLANNING:  Antibiotic duration: Continue IV antibiotics tissue cultures pending  Weightbearing: Patient may be out of bed as tolerated  Pain medication: Opioid pathway  Dressing care/ Wound  VAC: Wound VAC  Ambulatory devices: Walker  Plan to return to the operating room on Friday.  Patient may require an above-knee amputation.  Follow-up: In the office 1 week post operative.

## 2023-07-23 NOTE — Telephone Encounter (Signed)
 Patient Product/process development scientist completed.    The patient is insured through [facility]. Patient has [employer], may use a copay card, and/or apply for patient assistance if available.    Ran test claim for Dexcom G7 Sensor and Requires Prior Authorization  Ran test claim for [employer] 3 Plus Sensor and Requires Prior Authorization  This test claim was processed through [employer]- copay amounts may vary at other pharmacies due to [employer], or as the patient moves through the different stages of their insurance plan.     Morgan Arab, CPHT Pharmacy Technician III Certified Patient Advocate [facility] Patient Advocate Team Direct Number: [phone]  Fax: [phone]

## 2023-07-23 NOTE — Transfer of Care (Signed)
 Immediate Anesthesia Transfer of Care Note  Patient: Scott Navarro.  Procedure(s) Performed: AMPUTATION, BELOW KNEE, LEFT (Left: Knee)  Patient Location: PACU  Anesthesia Type:General  Level of Consciousness: awake, alert , and oriented  Airway & Oxygen Therapy: Patient Spontanous Breathing  Post-op Assessment: Report given to RN, Post -op Vital signs reviewed and stable, and Patient moving all extremities  Post vital signs: Reviewed and stable  Last Vitals:  Vitals Value Taken Time  BP 178/79 07/23/23 1645  Temp 36.6 C 07/23/23 1634  Pulse 100 07/23/23 1647  Resp 14 07/23/23 1647  SpO2 98 % 07/23/23 1647  Vitals shown include unfiled device data.  Last Pain:  Vitals:   07/23/23 1505  TempSrc: Oral  PainSc:          Complications: No notable events documented.

## 2023-07-23 NOTE — Consult Note (Signed)
 ORTHOPAEDIC CONSULTATION  REQUESTING PHYSICIAN: Magdalene School, MD  Chief Complaint: Necrotic wound left foot.  HPI: Scott Navarro. is a 58 y.o. male who presents with necrotic wound left foot.  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  Patient has a history of uncontrolled type 2 diabetes.  Past Medical History:  Diagnosis Date   Type II diabetes mellitus (HCC) dx'd 10/08/2016   Past Surgical History:  Procedure Laterality Date   AMPUTATION Right 10/09/2016   Procedure: INCISION AND DRAINAGE RIGHT GREAT TOE;  Surgeon: Timothy Ford, MD;  Location: [facility] OR;  Service: Orthopedics;  Laterality: Right;   Social History   Socioeconomic History   Marital status: Married    Spouse name: Not on file   Number of children: Not on file   Years of education: Not on file   Highest education level: Not on file  Occupational History   Not on file  Tobacco Use   Smoking status: Never   Smokeless tobacco: Never  Vaping Use   Vaping status: Never Used  Substance and Sexual Activity   Alcohol use: No   Drug use: No   Sexual activity: Not Currently  Other Topics Concern   Not on file  Social History Narrative   Not on file   Social Drivers of Health   Financial Resource Strain: Not on file  Food Insecurity: Not on file  Transportation Needs: Not on file  Physical Activity: Not on file  Stress: Not on file  Social Connections: Not on file   Family History  Problem Relation Age of Onset   Diabetes Father    Lung cancer Other    Stroke Other    CAD Neg Hx    Hypertension Neg Hx    - negative except otherwise stated in the family history section Allergies  Allergen Reactions   Metformin  And Related Other (See Comments)    Severe diarrhea   Prior to Admission medications   Medication Sig Start Date End Date Taking? Authorizing Provider  diclofenac Sodium (VOLTAREN) 1 % GEL Apply 4 g topically as needed (Bilateral leg and hand pain).    Yes [provider]  naproxen sodium (ALEVE) 220 MG tablet Take 440 mg by mouth as needed.   Yes [provider]  rosuvastatin  (CRESTOR ) 20 MG tablet Take 1 tablet (20 mg total) by mouth daily. Patient taking differently: Take 20 mg by mouth at bedtime. 02/25/23 02/25/24 Yes Tysinger, Christiane Cowing, PA-C  tirzepatide  (MOUNJARO ) 15 MG/0.5ML Pen Inject 15 mg into the skin once a week. 05/15/23  Yes Tysinger, Christiane Cowing, PA-C  valsartan  (DIOVAN ) 40 MG tablet Take 1 tablet (40 mg total) by mouth daily. Patient taking differently: Take 40 mg by mouth at bedtime. 05/15/23  Yes Tysinger, Christiane Cowing, PA-C   CT EXTREMITY LOWER LEFT W CONTRAST Result Date: 07/22/2023 CLINICAL DATA:  Soft tissue infection suspected, lower leg, xray done EXAM: CT OF THE LOWER LEFT EXTREMITY WITH CONTRAST TECHNIQUE: Multidetector CT imaging of the lower left extremity was performed according to the standard protocol following intravenous contrast administration. RADIATION DOSE REDUCTION: This exam was performed according to the departmental dose-optimization program which includes automated exposure control, adjustment of the mA and/or kV according to patient size and/or use of iterative reconstruction technique. CONTRAST:  75mL OMNIPAQUE IOHEXOL 350 MG/ML SOLN COMPARISON:  None Available. FINDINGS: Bones/Joint/Cartilage No acute bony abnormality. Specifically, no fracture, subluxation, or dislocation. No bone destruction seen to suggest  osteomyelitis. 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 Subchondral cystic change throughout the midfoot and hindfoot. Ligaments Suboptimally assessed by CT. Muscles and Tendons Unremarkable. Soft tissues Gas is noted throughout the soft tissues in the left foot and extends proximally throughout the soft tissues of the left calf. This also extends along the fascial planes in the region of the knee posteriorly and within the fascial planes adjacent to the hamstring muscles up into  the proximal thigh. Diffuse calcifications throughout the superficial femoral artery and popliteal artery, but the vessels are patent. Trifurcation vessels are diseased but patent into the distal calf. IMPRESSION: Extensive soft tissue gas throughout the left leg and foot. No acute bony abnormality.  No fracture or bone destruction. Electronically Signed   By: Janeece Mechanic M.D.   On: 07/22/2023 20:32   DG Chest Port 1 View Result Date: 07/22/2023 CLINICAL DATA:  Sepsis EXAM: PORTABLE CHEST 1 VIEW COMPARISON:  Chest radiograph dated 12/19/2008 FINDINGS: Normal lung volumes. No focal consolidations. No pleural effusion or pneumothorax. The heart size and mediastinal contours are within normal limits. No acute osseous abnormality. IMPRESSION: No acute disease. Electronically Signed   By: Limin  Xu M.D.   On: 07/22/2023 20:15   DG Foot 2 Views Left Result Date: 07/22/2023 CLINICAL DATA:  Left foot wound EXAM: LEFT FOOT - 2 VIEW COMPARISON:  None Available. FINDINGS: There is no evidence of fracture or dislocation. Extensive subcutaneous emphysema involving the mid and hindfoot extending into the partially imaged distal lower leg. Charcot joint of the hindfoot. IMPRESSION: 1. Extensive subcutaneous emphysema involving the mid and hindfoot extending into the partially imaged distal lower leg. 2. Charcot joint of the hindfoot. Electronically Signed   By: Limin  Xu M.D.   On: 07/22/2023 20:13   - pertinent xrays, CT, MRI studies were reviewed and independently interpreted  Positive ROS: All other systems have been reviewed and were otherwise negative with the exception of those mentioned in the HPI and as above.  Physical Exam: General: Alert, no acute distress Psychiatric: Patient is competent for consent with normal mood and affect Lymphatic: No axillary or cervical lymphadenopathy Cardiovascular: No pedal edema Respiratory: No cyanosis, no use of accessory musculature GI: No organomegaly, abdomen is  soft and non-tender    Images:  @ENCIMAGES @  Labs:  Lab Results  Component Value Date   HGBA1C 6.7 (H) 07/23/2023   HGBA1C 6.7 (H) 06/10/2023   HGBA1C 8.3 (H) 02/24/2023   ESRSEDRATE 92 (H) 10/09/2016   CRP 2.7 (H) 10/09/2016   REPTSTATUS PENDING 07/22/2023   GRAMSTAIN  10/09/2016    FEW WBC PRESENT, PREDOMINANTLY PMN ABUNDANT GRAM POSITIVE COCCI IN PAIRS MODERATE GRAM NEGATIVE RODS FEW GRAM POSITIVE RODS    CULT  07/22/2023    NO GROWTH < 12 HOURS Performed at [facility], [address], [place], [place] [address]    LABORGA PROTEUS MIRABILIS 10/09/2016   LABORGA ENTEROBACTER AEROGENES 10/09/2016    Lab Results  Component Value Date   ALBUMIN 2.0 (L) 07/22/2023   ALBUMIN 4.1 06/10/2023   ALBUMIN 3.7 (L) 02/24/2023   PREALBUMIN 17.8 (L) 10/09/2016        Latest Ref Rng & Units 07/23/2023    4:10 AM 07/22/2023    7:52 PM 07/22/2023    7:50 PM  CBC EXTENDED  WBC 4.0 - 10.5 K/uL 38.7  44.6    RBC 4.22 - 5.81 MIL/uL 3.19  3.80    Hemoglobin 13.0 - 17.0 g/dL 9.4  11.0  11.9   HCT 39.0 - 52.0 % 27.1  32.5  35.0   Platelets 150 - 400 K/uL 436  512    NEUT# 1.7 - 7.7 K/uL 36.0  39.8    Lymph# 0.7 - 4.0 K/uL 1.2  1.1      Neurologic: Patient does not have protective sensation bilateral lower extremities.   MUSCULOSKELETAL:   Skin: Examination patient has necrotic circumferential tissue of the left foot.  There is an ulcer that extends to bone.  There is purulent drainage.  Review of the CT scan shows air in the soft tissue extending up to the thigh, consistent with necrotizing fasciitis extending up the tissue planes to the hip.  LRNEC score is 9 consistent with necrotizing fasciitis.  Assessment: Assessment: Necrotizing fasciitis left lower extremity.  Plan: Will plan for emergent surgery with a below the knee amputation possible above-knee amputation with incisions extended medially and laterally up through the thigh.  Will plan for packing the  wound open with cleanse choice wound VAC sponges.  Applying Kerecis tissue graft to the viable tissue with excision of fascia.  Discussed with patient this is a life-threatening and limb threatening infection.  Plan for return to the operating room on Friday.  Thank you for the consult and the opportunity to see Mr. Scott Lingard, MD [facility] [phone] 12:54 PM

## 2023-07-23 NOTE — Anesthesia Preprocedure Evaluation (Addendum)
 Anesthesia Evaluation  Patient identified by MRN, date of birth, ID band Patient awake    Reviewed: Allergy & Precautions, NPO status , Patient's Chart, lab work & pertinent test results  Airway Mallampati: II  TM Distance: >3 FB Neck ROM: Full    Dental  (+) Dental Advisory Given, Missing   Pulmonary neg pulmonary ROS   Pulmonary exam normal breath sounds clear to auscultation       Cardiovascular hypertension, Pt. on medications Normal cardiovascular exam Rhythm:Regular Rate:Normal     Neuro/Psych negative neurological ROS  negative psych ROS   GI/Hepatic negative GI ROS, Neg liver ROS,,,  Endo/Other  diabetes, Poorly Controlled, Type 2    Renal/GU Renal InsufficiencyRenal diseaseLab Results      Component                Value               Date                      NA                       135                 07/23/2023                CL                       102                 07/23/2023                K                        3.7                 07/23/2023                CO2                      20 (L)              07/23/2023                BUN                      57 (H)              07/23/2023                CREATININE               1.84 (H)            07/23/2023                GFRNONAA                 42 (L)              07/23/2023                CALCIUM                   8.1 (L)             07/23/2023                PHOS  3.3                 10/09/2016                ALBUMIN                  2.0 (L)             07/22/2023                GLUCOSE                  170 (H)             07/23/2023             negative genitourinary   Musculoskeletal  (+) Arthritis ,    Abdominal   Peds  Hematology  (+) Blood dyscrasia, anemia Lab Results      Component                Value               Date                      WBC                      38.7 (H)            07/23/2023                HGB                       9.4 (L)             07/23/2023                HCT                      27.1 (L)            07/23/2023                MCV                      85.0                07/23/2023                PLT                      436 (H)             07/23/2023              Anesthesia Other Findings BKA vs AKA with hip debridement; necrotizing fascitis    Reproductive/Obstetrics                             Anesthesia Physical Anesthesia Plan  ASA: 4 and emergent  Anesthesia Plan: General   Post-op Pain Management: Ofirmev  IV (intra-op)* and Dilaudid  IV   Induction: Intravenous and Rapid sequence  PONV Risk Score and Plan: 2 and Midazolam  and Ondansetron   Airway Management Planned: Oral ETT and Video Laryngoscope Planned  Additional Equipment: Arterial line  Intra-op Plan:   Post-operative Plan: Extubation in OR  Informed Consent: I have reviewed the patients History and Physical, chart, labs and discussed the procedure including the risks, benefits and  alternatives for the proposed anesthesia with the patient or authorized representative who has indicated his/her understanding and acceptance.     Dental advisory given  Plan Discussed with: CRNA  Anesthesia Plan Comments:        Anesthesia Quick Evaluation

## 2023-07-24 ENCOUNTER — Encounter (HOSPITAL_COMMUNITY): Payer: Self-pay | Admitting: Orthopedic Surgery

## 2023-07-24 DIAGNOSIS — M726 Necrotizing fasciitis: Secondary | ICD-10-CM | POA: Diagnosis not present

## 2023-07-24 LAB — BPAM RBC
Blood Product Expiration Date: 202506192359
Blood Product Expiration Date: 202506202359
ISSUE DATE / TIME: 202505211518
ISSUE DATE / TIME: 202505211518
Unit Type and Rh: 6200
Unit Type and Rh: 6200

## 2023-07-24 LAB — TYPE AND SCREEN
ABO/RH(D): A POS
Antibody Screen: NEGATIVE
Unit division: 0
Unit division: 0

## 2023-07-24 LAB — BASIC METABOLIC PANEL WITH GFR
Anion gap: 11 (ref 5–15)
BUN: 50 mg/dL — ABNORMAL HIGH (ref 6–20)
CO2: 22 mmol/L (ref 22–32)
Calcium: 7.7 mg/dL — ABNORMAL LOW (ref 8.9–10.3)
Chloride: 104 mmol/L (ref 98–111)
Creatinine, Ser: 1.98 mg/dL — ABNORMAL HIGH (ref 0.61–1.24)
GFR, Estimated: 38 mL/min — ABNORMAL LOW (ref >60)
Glucose, Bld: 207 mg/dL — ABNORMAL HIGH (ref 70–99)
Potassium: 3.3 mmol/L — ABNORMAL LOW (ref 3.5–5.1)
Sodium: 137 mmol/L (ref 135–145)

## 2023-07-24 LAB — GLUCOSE, CAPILLARY
Glucose-Capillary: 203 mg/dL — ABNORMAL HIGH (ref 70–99)
Glucose-Capillary: 192 mg/dL — ABNORMAL HIGH (ref 70–99)
Glucose-Capillary: 210 mg/dL — ABNORMAL HIGH (ref 70–99)
Glucose-Capillary: 183 mg/dL — ABNORMAL HIGH (ref 70–99)
Glucose-Capillary: 168 mg/dL — ABNORMAL HIGH (ref 70–99)

## 2023-07-24 LAB — PHOSPHORUS: Phosphorus: 4.1 mg/dL (ref 2.5–4.6)

## 2023-07-24 LAB — MAGNESIUM: Magnesium: 1.9 mg/dL (ref 1.7–2.4)

## 2023-07-24 MED ORDER — INSULIN GLARGINE-YFGN 100 UNIT/ML ~~LOC~~ SOLN
5.0000 [IU] | SUBCUTANEOUS | Status: DC
  Administered 2023-07-24 – 2023-07-29 (×6): 5 [IU] via SUBCUTANEOUS
  Filled 2023-07-24 (×7): qty 0.05

## 2023-07-24 MED ORDER — POVIDONE-IODINE 10 % EX SWAB
2.0000 | Freq: Once | CUTANEOUS | Status: AC
  Administered 2023-07-25: 2 via TOPICAL

## 2023-07-24 MED ORDER — POTASSIUM CHLORIDE 20 MEQ PO PACK
40.0000 meq | PACK | Freq: Once | ORAL | Status: AC
  Administered 2023-07-24: 40 meq via ORAL
  Filled 2023-07-24: qty 2

## 2023-07-24 MED ORDER — CEFAZOLIN SODIUM-DEXTROSE 2-4 GM/100ML-% IV SOLN
2.0000 g | INTRAVENOUS | Status: AC
  Administered 2023-07-25: 2 g via INTRAVENOUS
  Filled 2023-07-24 (×2): qty 100

## 2023-07-24 MED ORDER — INSULIN GLARGINE 100 UNITS/ML SOLOSTAR PEN: 5.0000 [IU] | PEN_INJECTOR | SUBCUTANEOUS | Status: DC

## 2023-07-24 MED ORDER — INSULIN ASPART 100 UNIT/ML IJ SOLN
0.0000 [IU] | Freq: Three times a day (TID) | INTRAMUSCULAR | Status: DC
  Administered 2023-07-24 – 2023-07-25 (×2): 2 [IU] via SUBCUTANEOUS

## 2023-07-24 MED ORDER — CHLORHEXIDINE GLUCONATE 4 % EX SOLN
60.0000 mL | Freq: Once | CUTANEOUS | Status: AC
  Administered 2023-07-25: 4 via TOPICAL
  Filled 2023-07-24: qty 60

## 2023-07-24 NOTE — Progress Notes (Signed)
 Patient ID: Scott Life., male   DOB: 12-Oct-1965, 58 y.o.   MRN: [id number] Patient is postoperative day 1 left transtibial amputation and debridement of necrotizing fasciitis that extended up through the thigh medially and laterally.  Patient had bubbling fluid around the fascia of proximal and medial thigh.  Patient states he is feeling better.  There is 400 cc of clear serosanguineous drainage in the wound VAC canister.  The 3 tissue cultures are pending.  Plan for return to the operating room tomorrow Friday.  Discussed that we may need to continue surgery next week.

## 2023-07-24 NOTE — Plan of Care (Signed)
  Problem: Coping: Goal: Ability to adjust to condition or change in health will improve Outcome: Progressing   Problem: Tissue Perfusion: Goal: Adequacy of tissue perfusion will improve Outcome: Progressing   Problem: Urinary Elimination: Goal: Ability to achieve and maintain adequate renal perfusion and functioning will improve Outcome: Progressing   Problem: Pain Managment: Goal: General experience of comfort will improve and/or be controlled Outcome: Progressing   Problem: Safety: Goal: Ability to remain free from injury will improve Outcome: Progressing

## 2023-07-24 NOTE — Addendum Note (Signed)
 Addendum  created 07/24/23 1746 by Grace Laura, MD   Clinical Note Signed

## 2023-07-24 NOTE — Anesthesia Postprocedure Evaluation (Signed)
 Anesthesia Post Note  Patient: Scott Navarro.  Procedure(s) Performed: AMPUTATION, BELOW KNEE, LEFT (Left: Knee)     Patient location during evaluation: PACU Anesthesia Type: General Level of consciousness: awake and alert Pain management: pain level controlled Vital Signs Assessment: post-procedure vital signs reviewed and stable Respiratory status: spontaneous breathing, nonlabored ventilation, respiratory function stable and patient connected to nasal cannula oxygen Cardiovascular status: blood pressure returned to baseline and stable Postop Assessment: no apparent nausea or vomiting Anesthetic complications: no  No notable events documented.  Last Vitals:  Vitals:   07/24/23 0033 07/24/23 0435  BP: 125/75 131/70  Pulse:  84  Resp: 18 17  Temp: 36.7 C 36.4 C  SpO2: 99% 98%    Last Pain:  Vitals:   07/24/23 0435  TempSrc: Oral  PainSc:                  Jacquelene Kopecky L Aylinn Rydberg

## 2023-07-24 NOTE — Progress Notes (Signed)
 PROGRESS NOTE    Scott Navarro.  QMV:[id number] DOB: May 01, 1965 DOA: 07/22/2023 PCP: Claudene Crystal, PA-C   Brief Narrative:  This 58 y.o. male with history of diabetes mellitus type 2 presents to the ED because of worsening wound on the left leg.  Patient states about 4 to 5 days ago he noticed small discoloration on the lateral aspect of his left foot which has rapidly progressed involving the whole left foot with discharge and blebs. He denies any recent trauma or insect bites.  Denies fever or chills.  Patient states over the last 1 week he has been feeling poorly with weakness and shortness of breath and some chest tightness. CT scan shows extensive gas formation in the entire left lower extremity and foot.  Dr. Hulda Mage orthopedic surgeon was consulted.  Initial plan was to take patient to OR in the morning, but later decided to continue aggressive IV antibiotics. Final plan will be decided today.  Patient was admitted for further evaluation.  Assessment & Plan:   Principal Problem:   Necrotizing fasciitis (HCC) Active Problems:   Hypokalemia   Type 2 diabetes mellitus with hyperglycemia (HCC)   Charcot joint of left foot   Anemia   Hyponatremia   ARF (acute renal failure) (HCC)   Hyperlipidemia   Amputation below knee (HCC)  Necrotizing fasciitis of the left lower extremity : Patient presented with LLE cellulitis, necrosis , foul smelling discharge. Appreciate orthopedic surgery consult. Initial Plan is to take patient to the OR in the morning.   Continue IV Zyvox and Zosyn.  Follow cultures,  Continue hydration , adequate pain control. Patient underwent left transtibial amputation and debridement of necrotizing fasciitis.  POD #1. Patient has to return to the OR tomorrow for further debridement.  Diabetes mellitus type 2: He takes Mounjaro  at home.  Last hemoglobin A1c was 6.7 a month ago.   Given the acuity of patient's condition , started patient on insulin   infusion. Start Lantus 5 units daily, regular insulin  sliding scale.  Discontinue insulin  infusion  Essential Hypertension: He takes ARB.  Will keep patient on as needed IV hydralazine for now. Will hold ARB due to AKI.  Chronic Anemia: Appears to be chronic follow CBC.  Hyperlipidemia : Continue Statins.  Acute kidney injury likely from sepsis: Closely monitor intake/ output charting,  metabolic panel. Hold ARB,  continue hydration.  Monitor renal functions  Hypokalemia: Replaced, Continue to monitor  Prolonged Qtc:  replace electrolytes and recheck EKG.  Leukocytosis: Likely secondary to infection. Continue to monitor  Elevated troponin secondary demand ischemia: Continue to trend troponin.  DVT prophylaxis: heparin Code Status: Full code Family Communication: No family at bed side. Disposition Plan:    Status is: Inpatient Remains inpatient appropriate because: Admitted for necrotizing fascitis left lower extremity. Status post left transtibial amputation and debridement of necrotizing fasciitis.  POD #1    Consultants:  Orthopeadics  Procedures: None Antimicrobials: Anti-infectives (From admission, onward)    Start     Dose/Rate Route Frequency Ordered Stop   07/23/23 1548  vancomycin (VANCOCIN) powder  Status:  Discontinued          As needed 07/23/23 1548 07/23/23 1629   07/23/23 1000  linezolid (ZYVOX) IVPB 600 mg        600 mg 300 mL/hr over 60 Minutes Intravenous Every 12 hours 07/22/23 2324     07/23/23 0400  piperacillin-tazobactam (ZOSYN) IVPB 3.375 g        3.375 g 12.5 mL/hr  over 240 Minutes Intravenous Every 8 hours 07/22/23 2323     07/22/23 1930  piperacillin-tazobactam (ZOSYN) IVPB 3.375 g        3.375 g 100 mL/hr over 30 Minutes Intravenous  Once 07/22/23 1922 07/22/23 2110   07/22/23 1930  linezolid (ZYVOX) IVPB 600 mg        600 mg 300 mL/hr over 60 Minutes Intravenous  Once 07/22/23 1922 07/22/23 2200       Subjective: Patient  was seen and examined at bedside.Overnight events noted. Patient is status post left transtibial amputation POD #1 tolerated well. He reports pain is reasonably controlled.  Objective: Vitals:   07/24/23 0033 07/24/23 0435 07/24/23 0901 07/24/23 1211  BP: 125/75 131/70 114/60 134/69  Pulse:  84 87 94  Resp: 18 17 17 18   Temp: 98.1 F (36.7 C) 97.6 F (36.4 C) 98 F (36.7 C) 98 F (36.7 C)  TempSrc: Oral Oral Oral Oral  SpO2: 99% 98% 100% 99%  Weight:      Height:        Intake/Output Summary (Last 24 hours) at 07/24/2023 1319 Last data filed at 07/24/2023 0902 Gross per 24 hour  Intake 3168.68 ml  Output 1775 ml  Net 1393.68 ml   Filed Weights   07/23/23 1505  Weight: 80.7 kg    Examination:  General exam: Appears calm and comfortable, not in any acute distress. Respiratory system: CTA Bilaterally. Respiratory effort normal.  RR 15 Cardiovascular system: S1 & S2 heard, RRR. No JVD, murmurs, rubs, gallops or clicks. No pedal edema. Gastrointestinal system: Abdomen is non distended, soft and non tender. Normal bowel sounds heard. Central nervous system: Alert and oriented x 3. No focal neurological deficits. Extremities: Status post left transtibial amputation. POD # 1 Skin: No rashes, lesions or ulcers Psychiatry: Judgement and insight appear normal. Mood & affect appropriate.     Data Reviewed: I have personally reviewed following labs and imaging studies  CBC: Recent Labs  Lab 07/22/23 1950 07/22/23 1952 07/23/23 0410 07/23/23 1611 07/23/23 1952  WBC  --  44.6* 38.7*  --  30.3*  NEUTROABS  --  39.8* 36.0*  --   --   HGB 11.9* 11.0* 9.4* 8.8* 9.5*  HCT 35.0* 32.5* 27.1* 26.0* 27.4*  MCV  --  85.5 85.0  --  85.1  PLT  --  512* 436*  --  428*   Basic Metabolic Panel: Recent Labs  Lab 07/22/23 1950 07/22/23 1952 07/23/23 0041 07/23/23 0410 07/23/23 1611 07/23/23 1952 07/24/23 0610  NA 135 134* 134* 135 134*  --  137  K 3.4* 3.3* 3.2* 3.7 4.1  --   3.3*  CL 99 99 101 102  --   --  104  CO2  --  23 19* 20*  --   --  22  GLUCOSE 337* 341* 325* 170*  --   --  207*  BUN 60* 67* 61* 57*  --   --  50*  CREATININE 2.30* 2.34* 1.93* 1.84*  --  1.74* 1.98*  CALCIUM   --  8.4* 8.1* 8.1*  --   --  7.7*  MG  --   --   --   --   --   --  1.9  PHOS  --   --   --   --   --   --  4.1   GFR: Estimated Creatinine Clearance: 44.6 mL/min (A) (by C-G formula based on SCr of 1.98 mg/dL (H)). Liver Function Tests:  Recent Labs  Lab 07/22/23 1952  AST 63*  ALT 42  ALKPHOS 204*  BILITOT 1.0  PROT 7.0  ALBUMIN 2.0*   No results for input(s): "LIPASE", "AMYLASE" in the last 168 hours. No results for input(s): "AMMONIA" in the last 168 hours. Coagulation Profile: Recent Labs  Lab 07/22/23 1952  INR 1.2   Cardiac Enzymes: Recent Labs  Lab 07/23/23 0410  CKTOTAL 29*   BNP (last 3 results) No results for input(s): "PROBNP" in the last 8760 hours. HbA1C: Recent Labs    07/23/23 0534  HGBA1C 6.7*   CBG: Recent Labs  Lab 07/23/23 1639 07/23/23 1746 07/23/23 1957 07/24/23 0900 07/24/23 1213  GLUCAP 214* 224* 216* 203* 192*   Lipid Profile: No results for input(s): "CHOL", "HDL", "LDLCALC", "TRIG", "CHOLHDL", "LDLDIRECT" in the last 72 hours. Thyroid Function Tests: No results for input(s): "TSH", "T4TOTAL", "FREET4", "T3FREE", "THYROIDAB" in the last 72 hours. Anemia Panel: No results for input(s): "VITAMINB12", "FOLATE", "FERRITIN", "TIBC", "IRON", "RETICCTPCT" in the last 72 hours. Sepsis Labs: Recent Labs  Lab 07/22/23 1951 07/23/23 0047  LATICACIDVEN 1.3 1.3    Recent Results (from the past 240 hours)  Resp panel by RT-PCR (RSV, Flu A&B, Covid) Anterior Nasal Swab     Status: None   Collection Time: 07/22/23  7:22 PM   Specimen: Anterior Nasal Swab  Result Value Ref Range Status   SARS Coronavirus 2 by RT PCR NEGATIVE NEGATIVE Final   Influenza A by PCR NEGATIVE NEGATIVE Final   Influenza B by PCR NEGATIVE NEGATIVE  Final    Comment: (NOTE) The Xpert Xpress SARS-CoV-2/FLU/RSV plus assay is intended as an aid in the diagnosis of influenza from Nasopharyngeal swab specimens and should not be used as a sole basis for treatment. Nasal washings and aspirates are unacceptable for Xpert Xpress SARS-CoV-2/FLU/RSV testing.  Fact Sheet for Patients: BloggerCourse.com  Fact Sheet for Healthcare Providers: SeriousBroker.it  This test is not yet approved or cleared by the [place]  FDA and has been authorized for detection and/or diagnosis of SARS-CoV-2 by FDA under an Emergency Use Authorization (EUA). This EUA will remain in effect (meaning this test can be used) for the duration of the COVID-19 declaration under Section 564(b)(1) of the Act, 21 U.S.C. section 360bbb-3(b)(1), unless the authorization is terminated or revoked.     Resp Syncytial Virus by PCR NEGATIVE NEGATIVE Final    Comment: (NOTE) Fact Sheet for Patients: BloggerCourse.com  Fact Sheet for Healthcare Providers: SeriousBroker.it  This test is not yet approved or cleared by the [place]  FDA and has been authorized for detection and/or diagnosis of SARS-CoV-2 by FDA under an Emergency Use Authorization (EUA). This EUA will remain in effect (meaning this test can be used) for the duration of the COVID-19 declaration under Section 564(b)(1) of the Act, 21 U.S.C. section 360bbb-3(b)(1), unless the authorization is terminated or revoked.  Performed at [facility], [address] [place], [place] [address]   Blood Culture (routine x 2)     Status: None (Preliminary result)   Collection Time: 07/22/23  7:45 PM   Specimen: BLOOD  Result Value Ref Range Status   Specimen Description BLOOD RIGHT ANTECUBITAL  Final   Special Requests   Final    BOTTLES DRAWN AEROBIC AND ANAEROBIC Blood Culture adequate volume   Culture    Final    NO GROWTH 2 DAYS Performed at [facility], [address] [place], [place] [address]    Report Status PENDING  Incomplete  Blood Culture (routine x 2)     Status: None (Preliminary result)   Collection Time: 07/22/23  7:50 PM   Specimen: BLOOD LEFT FOREARM  Result Value Ref Range Status   Specimen Description BLOOD LEFT FOREARM  Final   Special Requests   Final    BOTTLES DRAWN AEROBIC AND ANAEROBIC Blood Culture adequate volume   Culture   Final    NO GROWTH 2 DAYS Performed at [facility], [address] [place], [place] [address]    Report Status PENDING  Incomplete  Aerobic/Anaerobic Culture w Gram Stain (surgical/deep wound)     Status: None (Preliminary result)   Collection Time: 07/23/23  3:54 PM   Specimen: PATH Amputaion Arm/Leg; Tissue  Result Value Ref Range Status   Specimen Description TISSUE  Final   Special Requests BELOW KNEE AMPUTATION SPEC A  Final   Gram Stain NO WBC SEEN NO ORGANISMS SEEN   Final   Culture   Final    NO GROWTH < 24 HOURS Performed at [facility], [address] [place], [place] [address]    Report Status PENDING  Incomplete  Aerobic/Anaerobic Culture w Gram Stain (surgical/deep wound)     Status: None (Preliminary result)   Collection Time: 07/23/23  3:56 PM   Specimen: PATH Amputaion Arm/Leg; Tissue  Result Value Ref Range Status   Specimen Description TISSUE  Final   Special Requests MEDIAN BELOW KNEE AMPUTATION SPEC B  Final   Gram Stain NO WBC SEEN NO ORGANISMS SEEN   Final   Culture   Final    NO GROWTH < 24 HOURS Performed at [facility], [address] [place], [place] [address]    Report Status PENDING  Incomplete  Aerobic/Anaerobic Culture w Gram Stain (surgical/deep wound)     Status: None (Preliminary result)   Collection Time: 07/23/23  3:56 PM   Specimen: PATH Amputaion Arm/Leg; Tissue  Result Value Ref Range Status   Specimen Description TISSUE  Final   Special Requests  LATERAL BELOW KNEE AMPUTATION SPEC C  Final   Gram Stain   Final    ABUNDANT WBC PRESENT, PREDOMINANTLY PMN NO ORGANISMS SEEN    Culture   Final    NO GROWTH < 24 HOURS Performed at [facility], [address], [place], [place] [address]    Report Status PENDING  Incomplete    Radiology Studies: CT EXTREMITY LOWER LEFT W CONTRAST Result Date: 07/22/2023 CLINICAL DATA:  Soft tissue infection suspected, lower leg, xray done EXAM: CT OF THE LOWER LEFT EXTREMITY WITH CONTRAST TECHNIQUE: Multidetector CT imaging of the lower left extremity was performed according to the standard protocol following intravenous contrast administration. RADIATION DOSE REDUCTION: This exam was performed according to the departmental dose-optimization program which includes automated exposure control, adjustment of the mA and/or kV according to patient size and/or use of iterative reconstruction technique. CONTRAST:  75mL OMNIPAQUE IOHEXOL 350 MG/ML SOLN COMPARISON:  None Available. FINDINGS: Bones/Joint/Cartilage No acute bony abnormality. Specifically, no fracture, subluxation, or dislocation. No bone destruction seen to suggest osteomyelitis. 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 Subchondral cystic change throughout the midfoot and hindfoot. Ligaments Suboptimally assessed by CT. Muscles and Tendons Unremarkable. Soft tissues Gas is noted throughout the soft tissues in the left foot and extends proximally throughout the soft tissues of the left calf. This also extends along the fascial planes in the region of the knee posteriorly and within the  fascial planes adjacent to the hamstring muscles up into the proximal thigh. Diffuse calcifications throughout the superficial femoral artery and popliteal artery, but the vessels are patent. Trifurcation vessels are diseased but patent into the distal calf. IMPRESSION: Extensive soft tissue gas throughout the left leg and foot. No acute bony abnormality.   No fracture or bone destruction. Electronically Signed   By: Janeece Mechanic M.D.   On: 07/22/2023 20:32   DG Chest Port 1 View Result Date: 07/22/2023 CLINICAL DATA:  Sepsis EXAM: PORTABLE CHEST 1 VIEW COMPARISON:  Chest radiograph dated 12/19/2008 FINDINGS: Normal lung volumes. No focal consolidations. No pleural effusion or pneumothorax. The heart size and mediastinal contours are within normal limits. No acute osseous abnormality. IMPRESSION: No acute disease. Electronically Signed   By: Limin  Xu M.D.   On: 07/22/2023 20:15   DG Foot 2 Views Left Result Date: 07/22/2023 CLINICAL DATA:  Left foot wound EXAM: LEFT FOOT - 2 VIEW COMPARISON:  None Available. FINDINGS: There is no evidence of fracture or dislocation. Extensive subcutaneous emphysema involving the mid and hindfoot extending into the partially imaged distal lower leg. Charcot joint of the hindfoot. IMPRESSION: 1. Extensive subcutaneous emphysema involving the mid and hindfoot extending into the partially imaged distal lower leg. 2. Charcot joint of the hindfoot. Electronically Signed   By: Limin  Xu M.D.   On: 07/22/2023 20:13   Scheduled Meds:  vitamin C  1,000 mg Oral Daily   heparin  5,000 Units Subcutaneous Q8H   insulin  aspart  0-6 Units Subcutaneous TID WC   insulin  aspart  0-9 Units Subcutaneous TID WC   insulin  glargine  5 Units Subcutaneous Q24H   nutrition supplement (JUVEN)  1 packet Oral BID BM   zinc sulfate (50mg  elemental zinc)  220 mg Oral Daily   Continuous Infusions:  sodium chloride  10 mL/hr at 07/23/23 1905   linezolid (ZYVOX) IV 600 mg (07/24/23 0918)   piperacillin-tazobactam (ZOSYN)  IV 3.375 g (07/24/23 0533)     LOS: 2 days    Time spent: 35 mins    Magdalene School, MD [facility]   If 7PM-7AM, please contact night-coverage

## 2023-07-24 NOTE — H&P (View-Only) (Signed)
 Patient ID: Scott Life., male   DOB: 12-Oct-1965, 58 y.o.   MRN: [id number] Patient is postoperative day 1 left transtibial amputation and debridement of necrotizing fasciitis that extended up through the thigh medially and laterally.  Patient had bubbling fluid around the fascia of proximal and medial thigh.  Patient states he is feeling better.  There is 400 cc of clear serosanguineous drainage in the wound VAC canister.  The 3 tissue cultures are pending.  Plan for return to the operating room tomorrow Friday.  Discussed that we may need to continue surgery next week.

## 2023-07-24 NOTE — Progress Notes (Signed)
   Inpatient Rehab Admissions Coordinator :  Per therapy recommendations, patient was screened for CIR candidacy by Jeannetta Millman RN MSN.  At this time patient appears to be a potential candidate for CIR but noted for further surgical debridement Friday. I will place order postop 5/23 for full assessment.  Jeannetta Millman, RN, MSN Rehab Admissions Coordinator [phone] 07/24/2023 3:31 PM

## 2023-07-24 NOTE — Plan of Care (Signed)
  Problem: Health Behavior/Discharge Planning: Goal: Ability to manage health-related needs will improve Outcome: Progressing   Problem: Metabolic: Goal: Ability to maintain appropriate glucose levels will improve Outcome: Progressing   Problem: Nutritional: Goal: Maintenance of adequate nutrition will improve Outcome: Progressing   Problem: Education: Goal: Knowledge of General Education information will improve Description: Including pain rating scale, medication(s)/side effects and non-pharmacologic comfort measures Outcome: Progressing   Problem: Pain Managment: Goal: General experience of comfort will improve and/or be controlled Outcome: Progressing

## 2023-07-24 NOTE — Evaluation (Signed)
 Physical Therapy Evaluation Patient Details Name: Scott Navarro. MRN: [id number] DOB: 10/20/1965 Today's Date: 07/24/2023  History of Present Illness  58 yo male presents to ED on 5/20 with L foot wound. S/p L BKA, debridement of necrotizing fasciitis in medial/lateral thigh 5/21. PMH includes DMII with polyneuropathy and charcot foot.  Clinical Impression  Pt presents with L residual limb pain, impaired balance, generalized weakness, and impaired activity tolerance vs baseline. Pt to benefit from acute PT to address deficits. Pt requiring min-mod assist to come to/from EOB, pt able to scoot at EOB with assist but limited in tolerance by pain and fatigue. Pt was very independent PTA, Patient will benefit from intensive inpatient follow-up therapy, >3 hours/day. PT to progress mobility as tolerated, and will continue to follow acutely.          If plan is discharge home, recommend the following: A lot of help with walking and/or transfers;A lot of help with bathing/dressing/bathroom;Assist for transportation;Help with stairs or ramp for entrance   Can travel by private vehicle        Equipment Recommendations Rolling walker (2 wheels)  Recommendations for Other Services       Functional Status Assessment Patient has had a recent decline in their functional status and demonstrates the ability to make significant improvements in function in a reasonable and predictable amount of time.     Precautions / Restrictions Precautions Precautions: Fall Precaution/Restrictions Comments: L wound vac, wound is open medially and packed with foam Restrictions Weight Bearing Restrictions Per Provider Order: No      Mobility  Bed Mobility Overal bed mobility: Needs Assistance Bed Mobility: Supine to Sit, Sit to Supine     Supine to sit: Min assist Sit to supine: Mod assist   General bed mobility comments: assist for trunk elevation, poor control in trunk lower, LE lift into bed, and  boost up once returned to supine.    Transfers Overall transfer level: Needs assistance   Transfers: Bed to chair/wheelchair/BSC            Lateral/Scoot Transfers: Min assist General transfer comment: assist for hip translation, scoot towards L x2 at EOB    Ambulation/Gait                  Stairs            Wheelchair Mobility     Tilt Bed    Modified Rankin (Stroke Patients Only)       Balance Overall balance assessment: Needs assistance Sitting-balance support: No upper extremity supported, Feet supported Sitting balance-Leahy Scale: Fair Sitting balance - Comments: can sit EOB unsupported, initially posterior bias but pt-corrected                                     Pertinent Vitals/Pain Pain Assessment Pain Assessment: 0-10 Pain Score: 4  Pain Location: LLE Pain Descriptors / Indicators: Discomfort, Guarding, Grimacing Pain Intervention(s): Repositioned, Limited activity within patient's tolerance, Monitored during session    Home Living Family/patient expects to be discharged to:: Private residence Living Arrangements: Spouse/significant other;Children Available Help at Discharge: Family Type of Home: House Home Access: Ramped entrance       Home Layout: One level Home Equipment: Scientist, research (medical) (4 wheels)      Prior Function Prior Level of Function : Independent/Modified Independent             Mobility  Comments: prior to admission, pt help caring for son who suffered from medical issues last year and is currently hoyer-lift level. pt endorses being completely independent ADLs Comments: indep     Extremity/Trunk Assessment   Upper Extremity Assessment Upper Extremity Assessment: Defer to OT evaluation    Lower Extremity Assessment Lower Extremity Assessment: LLE deficits/detail LLE Deficits / Details: able to perform quad set, hip extension into pillow in supine, knee flexion to 90 deg, SLR  without quad lag    Cervical / Trunk Assessment Cervical / Trunk Assessment: Normal  Communication   Communication Communication: No apparent difficulties    Cognition Arousal: Alert Behavior During Therapy: WFL for tasks assessed/performed   PT - Cognitive impairments: No apparent impairments                                 Cueing Cueing Techniques: Gestural cues, Verbal cues     General Comments      Exercises     Assessment/Plan    PT Assessment Patient needs continued PT services  PT Problem List Decreased strength;Decreased mobility;Decreased safety awareness;Decreased activity tolerance;Decreased balance;Decreased knowledge of use of DME;Pain;Impaired sensation;Decreased skin integrity       PT Treatment Interventions DME instruction;Therapeutic activities;Gait training;Therapeutic exercise;Patient/family education;Balance training;Functional mobility training;Neuromuscular re-education    PT Goals (Current goals can be found in the Care Plan section)  Acute Rehab PT Goals Patient Stated Goal: return to independence PT Goal Formulation: With patient/family Time For Goal Achievement: 08/07/23 Potential to Achieve Goals: Good    Frequency Min 3X/week     Co-evaluation               AM-PAC PT "6 Clicks" Mobility  Outcome Measure Help needed turning from your back to your side while in a flat bed without using bedrails?: A Little Help needed moving from lying on your back to sitting on the side of a flat bed without using bedrails?: A Lot Help needed moving to and from a bed to a chair (including a wheelchair)?: Total Help needed standing up from a chair using your arms (e.g., wheelchair or bedside chair)?: Total Help needed to walk in hospital room?: Total Help needed climbing 3-5 steps with a railing? : Total 6 Click Score: 9    End of Session   Activity Tolerance: Patient tolerated treatment well Patient left: in bed;with call  bell/phone within reach;with bed alarm set Nurse Communication: Mobility status PT Visit Diagnosis: Other abnormalities of gait and mobility (R26.89);Muscle weakness (generalized) (M62.81);Pain Pain - Right/Left: Left Pain - part of body: Leg    Time: 3086-5784 PT Time Calculation (min) (ACUTE ONLY): 30 min   Charges:   PT Evaluation $PT Eval Low Complexity: 1 Low PT Treatments $Therapeutic Activity: 8-22 mins PT General Charges $$ ACUTE PT VISIT: 1 Visit         Shirlene Doughty, PT DPT Acute Rehabilitation Services Secure Chat Preferred  Office [phone]   Jayleah Garbers E Burnadette Carrion 07/24/2023, 2:26 PM

## 2023-07-24 NOTE — Plan of Care (Signed)
  Problem: Coping: Goal: Ability to adjust to condition or change in health will improve Outcome: Progressing   Problem: Health Behavior/Discharge Planning: Goal: Ability to identify and utilize available resources and services will improve Outcome: Progressing   Problem: Nutritional: Goal: Maintenance of adequate nutrition will improve Outcome: Progressing   Problem: Skin Integrity: Goal: Risk for impaired skin integrity will decrease Outcome: Progressing   Problem: Tissue Perfusion: Goal: Adequacy of tissue perfusion will improve Outcome: Progressing   Problem: Nutritional: Goal: Maintenance of adequate nutrition will improve Outcome: Progressing   Problem: Pain Managment: Goal: General experience of comfort will improve and/or be controlled Outcome: Progressing   Problem: Safety: Goal: Ability to remain free from injury will improve Outcome: Progressing

## 2023-07-25 ENCOUNTER — Other Ambulatory Visit: Payer: Self-pay

## 2023-07-25 ENCOUNTER — Encounter (HOSPITAL_COMMUNITY)
Admission: EM | Disposition: A | Discharge status: Other Acute Inpatient Hospital | Payer: Self-pay | Source: Home / Self Care | Attending: Family Medicine

## 2023-07-25 ENCOUNTER — Inpatient Hospital Stay (HOSPITAL_COMMUNITY): Admitting: Anesthesiology

## 2023-07-25 ENCOUNTER — Encounter (HOSPITAL_COMMUNITY): Payer: Self-pay | Admitting: Orthopedic Surgery

## 2023-07-25 DIAGNOSIS — Z89512 Acquired absence of left leg below knee: Secondary | ICD-10-CM

## 2023-07-25 DIAGNOSIS — M726 Necrotizing fasciitis: Secondary | ICD-10-CM | POA: Diagnosis not present

## 2023-07-25 DIAGNOSIS — I10 Essential (primary) hypertension: Secondary | ICD-10-CM

## 2023-07-25 DIAGNOSIS — E119 Type 2 diabetes mellitus without complications: Secondary | ICD-10-CM

## 2023-07-25 HISTORY — PX: IRRIGATION AND DEBRIDEMENT KNEE: SHX5185

## 2023-07-25 LAB — GLUCOSE, CAPILLARY
Glucose-Capillary: 191 mg/dL — ABNORMAL HIGH (ref 70–99)
Glucose-Capillary: 180 mg/dL — ABNORMAL HIGH (ref 70–99)
Glucose-Capillary: 187 mg/dL — ABNORMAL HIGH (ref 70–99)
Glucose-Capillary: 205 mg/dL — ABNORMAL HIGH (ref 70–99)
Glucose-Capillary: 170 mg/dL — ABNORMAL HIGH (ref 70–99)

## 2023-07-25 LAB — SURGICAL PCR SCREEN
MRSA, PCR: NEGATIVE
Staphylococcus aureus: NEGATIVE

## 2023-07-25 LAB — BASIC METABOLIC PANEL WITH GFR
Anion gap: 9 (ref 5–15)
BUN: 49 mg/dL — ABNORMAL HIGH (ref 6–20)
CO2: 23 mmol/L (ref 22–32)
Calcium: 7.8 mg/dL — ABNORMAL LOW (ref 8.9–10.3)
Chloride: 107 mmol/L (ref 98–111)
Creatinine, Ser: 1.86 mg/dL — ABNORMAL HIGH (ref 0.61–1.24)
GFR, Estimated: 41 mL/min — ABNORMAL LOW (ref >60)
Glucose, Bld: 184 mg/dL — ABNORMAL HIGH (ref 70–99)
Potassium: 4 mmol/L (ref 3.5–5.1)
Sodium: 139 mmol/L (ref 135–145)

## 2023-07-25 SURGERY — IRRIGATION AND DEBRIDEMENT KNEE
Anesthesia: General | Site: Knee | Laterality: Left

## 2023-07-25 MED ORDER — MAGNESIUM SULFATE 2 GM/50ML IV SOLN: 2.0000 g | Freq: Every day | INTRAVENOUS | Status: DC | PRN

## 2023-07-25 MED ORDER — LACTATED RINGERS IV SOLN: INTRAVENOUS | Status: DC

## 2023-07-25 MED ORDER — PHENYLEPHRINE 80 MCG/ML (10ML) SYRINGE FOR IV PUSH (FOR BLOOD PRESSURE SUPPORT)
PREFILLED_SYRINGE | INTRAVENOUS | Status: DC | PRN
  Administered 2023-07-25: 80 ug via INTRAVENOUS

## 2023-07-25 MED ORDER — PROPOFOL 10 MG/ML IV BOLUS
INTRAVENOUS | Status: AC
  Filled 2023-07-25: qty 20

## 2023-07-25 MED ORDER — OXYCODONE HCL 5 MG PO TABS: 5.0000 mg | ORAL_TABLET | Freq: Once | ORAL | Status: DC | PRN

## 2023-07-25 MED ORDER — ZINC SULFATE 220 (50 ZN) MG PO CAPS: 220.0000 mg | ORAL_CAPSULE | Freq: Every day | ORAL | Status: DC

## 2023-07-25 MED ORDER — 0.9 % SODIUM CHLORIDE (POUR BTL) OPTIME
TOPICAL | Status: DC | PRN
  Administered 2023-07-25: 1000 mL

## 2023-07-25 MED ORDER — SODIUM CHLORIDE 0.9 % IV SOLN: INTRAVENOUS | Status: DC

## 2023-07-25 MED ORDER — VASHE WOUND IRRIGATION OPTIME
TOPICAL | Status: DC | PRN
Start: 2023-07-25 — End: 2023-07-25
  Administered 2023-07-25 (×3): 34 [oz_av]

## 2023-07-25 MED ORDER — LIDOCAINE 2% (20 MG/ML) 5 ML SYRINGE
INTRAMUSCULAR | Status: DC | PRN
  Administered 2023-07-25: 20 mg via INTRAVENOUS

## 2023-07-25 MED ORDER — INSULIN ASPART 100 UNIT/ML IJ SOLN
0.0000 [IU] | Freq: Three times a day (TID) | INTRAMUSCULAR | Status: DC
  Administered 2023-07-25 – 2023-07-26 (×2): 5 [IU] via SUBCUTANEOUS
  Administered 2023-07-26: 8 [IU] via SUBCUTANEOUS
  Administered 2023-07-26: 3 [IU] via SUBCUTANEOUS
  Administered 2023-07-27: 5 [IU] via SUBCUTANEOUS
  Administered 2023-07-27 – 2023-07-28 (×4): 3 [IU] via SUBCUTANEOUS
  Administered 2023-07-29 (×2): 2 [IU] via SUBCUTANEOUS
  Administered 2023-07-29: 3 [IU] via SUBCUTANEOUS
  Administered 2023-07-30: 2 [IU] via SUBCUTANEOUS
  Administered 2023-07-30: 3 [IU] via SUBCUTANEOUS

## 2023-07-25 MED ORDER — SUGAMMADEX SODIUM 200 MG/2ML IV SOLN
INTRAVENOUS | Status: DC | PRN
  Administered 2023-07-25: 200 mg via INTRAVENOUS

## 2023-07-25 MED ORDER — JUVEN PO PACK: 1.0000 | PACK | Freq: Two times a day (BID) | ORAL | Status: DC

## 2023-07-25 MED ORDER — POTASSIUM CHLORIDE CRYS ER 20 MEQ PO TBCR: 20.0000 meq | EXTENDED_RELEASE_TABLET | Freq: Every day | ORAL | Status: DC | PRN

## 2023-07-25 MED ORDER — MIDAZOLAM HCL 2 MG/2ML IJ SOLN: 0.5000 mg | Freq: Once | INTRAMUSCULAR | Status: DC | PRN

## 2023-07-25 MED ORDER — HYDROMORPHONE HCL 1 MG/ML IJ SOLN
0.2500 mg | INTRAMUSCULAR | Status: DC | PRN
  Administered 2023-07-25: 0.5 mg via INTRAVENOUS

## 2023-07-25 MED ORDER — ONDANSETRON HCL 4 MG/2ML IJ SOLN
INTRAMUSCULAR | Status: DC | PRN
  Administered 2023-07-25: 4 mg via INTRAVENOUS

## 2023-07-25 MED ORDER — ROCURONIUM BROMIDE 10 MG/ML (PF) SYRINGE
PREFILLED_SYRINGE | INTRAVENOUS | Status: DC | PRN
  Administered 2023-07-25: 50 mg via INTRAVENOUS

## 2023-07-25 MED ORDER — FENTANYL CITRATE (PF) 250 MCG/5ML IJ SOLN
INTRAMUSCULAR | Status: AC
Start: 2023-07-25 — End: ?
  Filled 2023-07-25: qty 5

## 2023-07-25 MED ORDER — HYDROMORPHONE HCL 1 MG/ML IJ SOLN
INTRAMUSCULAR | Status: AC
Start: 2023-07-25 — End: ?
  Filled 2023-07-25: qty 1

## 2023-07-25 MED ORDER — VITAMIN C 500 MG PO TABS: 1000.0000 mg | ORAL_TABLET | Freq: Every day | ORAL | Status: DC

## 2023-07-25 MED ORDER — EPHEDRINE SULFATE-NACL 50-0.9 MG/10ML-% IV SOSY: PREFILLED_SYRINGE | INTRAVENOUS | Status: DC | PRN

## 2023-07-25 MED ORDER — FENTANYL CITRATE (PF) 250 MCG/5ML IJ SOLN
INTRAMUSCULAR | Status: DC | PRN
  Administered 2023-07-25 (×2): 50 ug via INTRAVENOUS

## 2023-07-25 MED ORDER — PROPOFOL 10 MG/ML IV BOLUS
INTRAVENOUS | Status: DC | PRN
  Administered 2023-07-25: 100 mg via INTRAVENOUS

## 2023-07-25 MED ORDER — PHENYLEPHRINE HCL-NACL 20-0.9 MG/250ML-% IV SOLN
INTRAVENOUS | Status: DC | PRN
  Administered 2023-07-25: 35 ug/min via INTRAVENOUS

## 2023-07-25 MED ORDER — ORAL CARE MOUTH RINSE: 15.0000 mL | Freq: Once | OROMUCOSAL | Status: AC

## 2023-07-25 MED ORDER — CHLORHEXIDINE GLUCONATE 0.12 % MT SOLN: 15.0000 mL | Freq: Once | OROMUCOSAL | Status: AC

## 2023-07-25 MED ORDER — CHLORHEXIDINE GLUCONATE 0.12 % MT SOLN
OROMUCOSAL | Status: AC
  Administered 2023-07-25: 15 mL via OROMUCOSAL
  Filled 2023-07-25: qty 15

## 2023-07-25 MED ORDER — OXYCODONE HCL 5 MG/5ML PO SOLN: 5.0000 mg | Freq: Once | ORAL | Status: DC | PRN

## 2023-07-25 MED ORDER — LACTATED RINGERS IV SOLN: INTRAVENOUS | Status: DC | PRN

## 2023-07-25 SURGICAL SUPPLY — 39 items
BAG COUNTER SPONGE SURGICOUNT (BAG) IMPLANT
BLADE SURG 21 STRL SS (BLADE) ×1 IMPLANT
BNDG COHESIVE 4X5 TAN STRL LF (GAUZE/BANDAGES/DRESSINGS) IMPLANT
BNDG COHESIVE 6X5 TAN NS LF (GAUZE/BANDAGES/DRESSINGS) IMPLANT
BNDG COHESIVE 6X5 TAN ST LF (GAUZE/BANDAGES/DRESSINGS) IMPLANT
BNDG GAUZE DERMACEA FLUFF 4 (GAUZE/BANDAGES/DRESSINGS) IMPLANT
CANISTER WOUNDNEG PRESSURE 500 (CANNISTER) IMPLANT
CLEANSER WND VASHE 34 (WOUND CARE) IMPLANT
CLEANSER WND VASHE INSTL 34OZ (WOUND CARE) IMPLANT
COVER SURGICAL LIGHT HANDLE (MISCELLANEOUS) ×2 IMPLANT
DRAPE DERMATAC (DRAPES) IMPLANT
DRAPE U-SHAPE 47X51 STRL (DRAPES) ×1 IMPLANT
DRESSING PREVENA PLUS CUSTOM (GAUZE/BANDAGES/DRESSINGS) IMPLANT
DRESSING VERAFLO CLEANS CC MED (GAUZE/BANDAGES/DRESSINGS) IMPLANT
DRSG ADAPTIC 3X8 NADH LF (GAUZE/BANDAGES/DRESSINGS) ×1 IMPLANT
DURAPREP 26ML APPLICATOR (WOUND CARE) ×1 IMPLANT
ELECTRODE REM PT RTRN 9FT ADLT (ELECTROSURGICAL) IMPLANT
GAUZE PAD ABD 8X10 STRL (GAUZE/BANDAGES/DRESSINGS) IMPLANT
GAUZE SPONGE 4X4 12PLY STRL (GAUZE/BANDAGES/DRESSINGS) IMPLANT
GLOVE BIOGEL PI IND STRL 9 (GLOVE) ×1 IMPLANT
GLOVE SURG ORTHO 9.0 STRL STRW (GLOVE) ×1 IMPLANT
GOWN STRL REUS W/ TWL XL LVL3 (GOWN DISPOSABLE) ×2 IMPLANT
GRAFT SKIN WND SURGICLOSE M95 (Tissue) IMPLANT
KIT BASIN OR (CUSTOM PROCEDURE TRAY) ×1 IMPLANT
KIT TURNOVER KIT B (KITS) ×1 IMPLANT
MANIFOLD NEPTUNE II (INSTRUMENTS) ×1 IMPLANT
NS IRRIG 1000ML POUR BTL (IV SOLUTION) ×1 IMPLANT
PACK ORTHO EXTREMITY (CUSTOM PROCEDURE TRAY) ×1 IMPLANT
PAD ARMBOARD POSITIONER FOAM (MISCELLANEOUS) ×2 IMPLANT
PAD NEG PRESSURE SENSATRAC (MISCELLANEOUS) IMPLANT
SET HNDPC FAN SPRY TIP SCT (DISPOSABLE) IMPLANT
STAPLER SKIN PROX 35W (STAPLE) IMPLANT
STOCKINETTE IMPERVIOUS 9X36 MD (GAUZE/BANDAGES/DRESSINGS) IMPLANT
SUT ETHILON 2 0 PSLX (SUTURE) ×1 IMPLANT
SWAB COLLECTION DEVICE MRSA (MISCELLANEOUS) ×1 IMPLANT
SWAB CULTURE ESWAB REG 1ML (MISCELLANEOUS) IMPLANT
TOWEL GREEN STERILE (TOWEL DISPOSABLE) ×1 IMPLANT
TUBE CONNECTING 12X1/4 (SUCTIONS) ×1 IMPLANT
YANKAUER SUCT BULB TIP NO VENT (SUCTIONS) ×1 IMPLANT

## 2023-07-25 NOTE — Op Note (Signed)
 07/25/2023  12:46 PM  PATIENT:  Scott Navarro.    PRE-OPERATIVE DIAGNOSIS:  Necrotizing Fasciitis Left Leg  POST-OPERATIVE DIAGNOSIS:  Same  PROCEDURE: Revision left below-knee amputation. Irrigation debridement necrotizing fasciitis left thigh medially and laterally. Application of Kerecis micro graft 95 cm x 2 to cover wound surface area greater than 1000 cm. Application of Prevena customizable wound VAC sponge.  SURGEON:  Timothy Ford, MD  PHYSICIAN ASSISTANT:None ANESTHESIA:   General  PREOPERATIVE INDICATIONS:  Dontell Mian. is a  58 y.o. male with a diagnosis of Necrotizing Fasciitis Left Leg who failed conservative measures and elected for surgical management.    The risks benefits and alternatives were discussed with the patient preoperatively including but not limited to the risks of infection, bleeding, nerve injury, cardiopulmonary complications, the need for revision surgery, among others, and the patient was willing to proceed.  OPERATIVE IMPLANTS:   Implant Name Type Inv. Item Serial No. Manufacturer Lot No. LRB No. Used Action  GRAFT SKIN WND SURGICLOSE M95 - [id number] Tissue GRAFT SKIN WND SURGICLOSE M95  [employer] [id number] Left 1 Implanted  GRAFT SKIN WND SURGICLOSE M95 - [id number] Tissue GRAFT SKIN WND SURGICLOSE M95  [employer] [id number] Left 1 Implanted    @ENCIMAGES @  OPERATIVE FINDINGS: Muscle had much improved color there was a little contractility no evidence of fasciitis.  OPERATIVE PROCEDURE: Patient was brought the operating room underwent general anesthetic.  After adequate levels anesthesia were obtained patient's left lower extremity was prepped using DuraPrep draped into a sterile field a timeout was called.  Patient underwent revision of the below-knee amputation with excision of skin soft tissue muscle fascia.  This was taken back to improve bleeding muscle that had mild contractility.  This area was irrigated with Vashe x 2.   The medial and lateral thigh wounds were also debrided with a 21 blade knife and rondure with excision of skin soft tissue muscle and fascia.  Again there was improved soft tissue.  The medial lateral wounds were also irrigated with Vashe irrigation.  A total of 95 cm of Kerecis micro graft x 2 was used to cover the wounds medially and laterally to cover a wound surface area greater than 1000 cm.  The wounds were then closed with 2-0 nylon.  There was good closure.  This was then covered with a Prevena customizable wound VAC sponge secured this had a good suction fit patient was extubated taken the PACU in stable condition   DISCHARGE PLANNING:  Antibiotic duration: Continue IV antibiotics and adjust according to tissue sensitivities that are pending.  Weightbearing: Nonweightbearing on the left  Pain medication: Continue opioid pathway  Dressing care/ Wound VAC: Continue wound VAC for 1 week  Ambulatory devices: Walker  Discharge to: Anticipate discharge to skilled nursing.  Follow-up: In the office 1 week post operative.

## 2023-07-25 NOTE — Progress Notes (Signed)
 OT Cancellation Note  Patient Details Name: Scott Navarro. MRN: [id number] DOB: [birth year]   Cancelled Treatment:    Reason Eval/Treat Not Completed: Patient declined, no reason specified. Pt reporting fatigue post surgery, requesting OT return tomorrow.   Hawk Mones C, OT  Acute Rehabilitation Services Office [phone] Secure chat preferred   Mickael Alamo 07/25/2023, 4:00 PM

## 2023-07-25 NOTE — Plan of Care (Signed)
  Problem: Education: Goal: Ability to describe self-care measures that may prevent or decrease complications (Diabetes Survival Skills Education) will improve Outcome: Progressing   Problem: Nutritional: Goal: Maintenance of adequate nutrition will improve Outcome: Progressing   Problem: Skin Integrity: Goal: Risk for impaired skin integrity will decrease Outcome: Progressing   Problem: Tissue Perfusion: Goal: Adequacy of tissue perfusion will improve Outcome: Progressing   Problem: Activity: Goal: Risk for activity intolerance will decrease Outcome: Progressing   Problem: Pain Managment: Goal: General experience of comfort will improve and/or be controlled Outcome: Progressing

## 2023-07-25 NOTE — Anesthesia Procedure Notes (Signed)
 Procedure Name: Intubation Date/Time: 07/25/2023 11:50 AM  Performed by: Hershall Lory, CRNAPre-anesthesia Checklist: Patient identified, Emergency Drugs available, Suction available and Patient being monitored Patient Re-evaluated:Patient Re-evaluated prior to induction Oxygen Delivery Method: Circle system utilized Preoxygenation: Pre-oxygenation with 100% oxygen Induction Type: IV induction Ventilation: Mask ventilation without difficulty Tube type: Oral Number of attempts: 1 Airway Equipment and Method: Stylet and Oral airway Placement Confirmation: ETT inserted through vocal cords under direct vision, positive ETCO2 and breath sounds checked- equal and bilateral Tube secured with: Tape Dental Injury: Teeth and Oropharynx as per pre-operative assessment

## 2023-07-25 NOTE — Anesthesia Postprocedure Evaluation (Signed)
 Anesthesia Post Note  Patient: Scott Navarro.  Procedure(s) Performed: IRRIGATION AND DEBRIDEMENT LEFT KNEE (Left: Knee)     Patient location during evaluation: PACU Anesthesia Type: General Level of consciousness: awake and alert, patient cooperative and oriented Pain management: pain level controlled Vital Signs Assessment: post-procedure vital signs reviewed and stable Respiratory status: spontaneous breathing, nonlabored ventilation, respiratory function stable and patient connected to nasal cannula oxygen Cardiovascular status: blood pressure returned to baseline and stable Postop Assessment: no apparent nausea or vomiting Anesthetic complications: no   No notable events documented.  Last Vitals:  Vitals:   07/25/23 1330 07/25/23 1423  BP: 136/67 132/71  Pulse: 89 95  Resp: 11 17  Temp: 36.9 C 36.7 C  SpO2: 96% 98%    Last Pain:  Vitals:   07/25/23 1423  TempSrc: Oral  PainSc:                  Yenni Carra,E. Nadalie Laughner

## 2023-07-25 NOTE — Progress Notes (Signed)
 PROGRESS NOTE    Scott Navarro.  WUJ:[id number] DOB: 01-02-1966 DOA: 07/22/2023 PCP: Claudene Crystal, PA-C   Brief Narrative:  This 58 y.o. male with history of diabetes mellitus type 2 presents to the ED because of worsening wound on the left leg.  Patient states about 4 to 5 days ago he noticed small discoloration on the lateral aspect of his left foot which has rapidly progressed involving the whole left foot with discharge and blebs. He denies any recent trauma or insect bites.  Denies fever or chills.  Patient states over the last 1 week he has been feeling poorly with weakness and shortness of breath and some chest tightness. CT scan shows extensive gas formation in the entire left lower extremity and foot.  Dr. Hulda Mage orthopedic surgeon was consulted.  Initial plan was to take patient to OR in the morning, but later decided to continue aggressive IV antibiotics. Patient was admitted for further evaluation.  Assessment & Plan:   Principal Problem:   Necrotizing fasciitis (HCC) Active Problems:   Hypokalemia   Type 2 diabetes mellitus with hyperglycemia (HCC)   Charcot joint of left foot   Anemia   Hyponatremia   ARF (acute renal failure) (HCC)   Hyperlipidemia   Amputation below knee (HCC)  Necrotizing fasciitis of the left lower extremity : Patient presented with LLE cellulitis, necrosis , foul smelling discharge. Appreciate orthopedic surgery consult. Initial Plan is to take patient to the OR in the morning.   Continue IV Zyvox and Zosyn.  Follow cultures,  Continue hydration , adequate pain control. Patient underwent left transtibial amputation and debridement of necrotizing fasciitis.  POD #2. Patient went to the OR today for incision and drainage and further debridement. Follow-up postoperative course.  Diabetes mellitus type 2: He takes Mounjaro  at home.  Last hemoglobin A1c was 6.7 a month ago.   Given the acuity of patient's condition , started patient on insulin   infusion. Start Lantus 5 units daily, regular insulin  sliding scale.  Discontinue insulin  infusion  Essential Hypertension: He takes ARB.  Will keep patient on as needed IV hydralazine for now. Will hold ARB due to AKI.  Chronic Anemia: Appears to be chronic follow CBC.  Hyperlipidemia : Continue Statins.  Acute kidney injury likely from sepsis: Closely monitor intake/ output charting,  metabolic panel. Hold ARB,  continue hydration.  Monitor renal functions  Hypokalemia: Replaced, Resolved.  Prolonged Qtc: Replace electrolytes and recheck EKG.  Leukocytosis: Likely secondary to infection. Continue to monitor  Elevated troponin secondary demand ischemia: Continue to trend troponin.  DVT prophylaxis: heparin Code Status: Full code Family Communication: No family at bed side. Disposition Plan:    Status is: Inpatient Remains inpatient appropriate because: Admitted for necrotizing fascitis left lower extremity. Status post left transtibial amputation and debridement of necrotizing fasciitis.  POD # 2    Consultants:  Orthopeadics  Procedures: None Antimicrobials: Anti-infectives (From admission, onward)    Start     Dose/Rate Route Frequency Ordered Stop   07/25/23 0600  ceFAZolin  (ANCEF ) IVPB 2g/100 mL premix        2 g 200 mL/hr over 30 Minutes Intravenous On call to O.R. 07/24/23 1848 07/26/23 0559   07/23/23 1548  vancomycin (VANCOCIN) powder  Status:  Discontinued          As needed 07/23/23 1548 07/23/23 1629   07/23/23 1000  [MAR Hold]  linezolid (ZYVOX) IVPB 600 mg        (MAR Hold since  Fri 07/25/2023 at 0846.Hold Reason: Transfer to a Procedural area)   600 mg 300 mL/hr over 60 Minutes Intravenous Every 12 hours 07/22/23 2324     07/23/23 0400  [MAR Hold]  piperacillin-tazobactam (ZOSYN) IVPB 3.375 g        (MAR Hold since Fri 07/25/2023 at 0846.Hold Reason: Transfer to a Procedural area)   3.375 g 12.5 mL/hr over 240 Minutes Intravenous Every 8 hours  07/22/23 2323     07/22/23 1930  piperacillin-tazobactam (ZOSYN) IVPB 3.375 g        3.375 g 100 mL/hr over 30 Minutes Intravenous  Once 07/22/23 1922 07/22/23 2110   07/22/23 1930  linezolid (ZYVOX) IVPB 600 mg        600 mg 300 mL/hr over 60 Minutes Intravenous  Once 07/22/23 1922 07/22/23 2200       Subjective: Patient was seen and examined at bedside.Overnight events noted. Patient is status post left transtibial amputation POD # 2 tolerated well. He reports pain is reasonably controlled,  He is going to have procedure today.  Objective: Vitals:   07/24/23 2006 07/25/23 0436 07/25/23 0800 07/25/23 0850  BP: 108/64 (!) 144/74 (!) 152/75 (!) 147/71  Pulse: 87 85 91 91  Resp:  18 16 18   Temp: 98.2 F (36.8 C) 98 F (36.7 C) 98.2 F (36.8 C) 98.4 F (36.9 C)  TempSrc: Oral Oral Oral Oral  SpO2: 100% 99% 98% 96%  Weight:    84.8 kg  Height:    6' (1.829 m)    Intake/Output Summary (Last 24 hours) at 07/25/2023 1216 Last data filed at 07/25/2023 0901 Gross per 24 hour  Intake 360 ml  Output 1750 ml  Net -1390 ml   Filed Weights   07/23/23 1505 07/25/23 0850  Weight: 80.7 kg 84.8 kg    Examination:  General exam: Appears calm and comfortable, not in any acute distress. Respiratory system: CTA Bilaterally. Respiratory effort normal.  RR 15 Cardiovascular system: S1 & S2 heard, RRR. No JVD, murmurs, rubs, gallops or clicks. No pedal edema. Gastrointestinal system: Abdomen is non distended, soft and non tender. Normal bowel sounds heard. Central nervous system: Alert and oriented x 3. No focal neurological deficits. Extremities: Status post left transtibial amputation. POD # 2 Skin: No rashes, lesions or ulcers Psychiatry: Judgement and insight appear normal. Mood & affect appropriate.    Data Reviewed: I have personally reviewed following labs and imaging studies  CBC: Recent Labs  Lab 07/22/23 1950 07/22/23 1952 07/23/23 0410 07/23/23 1611 07/23/23 1952   WBC  --  44.6* 38.7*  --  30.3*  NEUTROABS  --  39.8* 36.0*  --   --   HGB 11.9* 11.0* 9.4* 8.8* 9.5*  HCT 35.0* 32.5* 27.1* 26.0* 27.4*  MCV  --  85.5 85.0  --  85.1  PLT  --  512* 436*  --  428*   Basic Metabolic Panel: Recent Labs  Lab 07/22/23 1952 07/23/23 0041 07/23/23 0410 07/23/23 1611 07/23/23 1952 07/24/23 0610 07/25/23 0727  NA 134* 134* 135 134*  --  137 139  K 3.3* 3.2* 3.7 4.1  --  3.3* 4.0  CL 99 101 102  --   --  104 107  CO2 23 19* 20*  --   --  22 23  GLUCOSE 341* 325* 170*  --   --  207* 184*  BUN 67* 61* 57*  --   --  50* 49*  CREATININE 2.34* 1.93* 1.84*  --  1.74* 1.98* 1.86*  CALCIUM  8.4* 8.1* 8.1*  --   --  7.7* 7.8*  MG  --   --   --   --   --  1.9  --   PHOS  --   --   --   --   --  4.1  --    GFR: Estimated Creatinine Clearance: 47.5 mL/min (A) (by C-G formula based on SCr of 1.86 mg/dL (H)). Liver Function Tests: Recent Labs  Lab 07/22/23 1952  AST 63*  ALT 42  ALKPHOS 204*  BILITOT 1.0  PROT 7.0  ALBUMIN 2.0*   No results for input(s): "LIPASE", "AMYLASE" in the last 168 hours. No results for input(s): "AMMONIA" in the last 168 hours. Coagulation Profile: Recent Labs  Lab 07/22/23 1952  INR 1.2   Cardiac Enzymes: Recent Labs  Lab 07/23/23 0410  CKTOTAL 29*   BNP (last 3 results) No results for input(s): "PROBNP" in the last 8760 hours. HbA1C: Recent Labs    07/23/23 0534  HGBA1C 6.7*   CBG: Recent Labs  Lab 07/24/23 1609 07/24/23 1738 07/24/23 2009 07/25/23 0758 07/25/23 1037  GLUCAP 210* 183* 168* 191* 180*   Lipid Profile: No results for input(s): "CHOL", "HDL", "LDLCALC", "TRIG", "CHOLHDL", "LDLDIRECT" in the last 72 hours. Thyroid Function Tests: No results for input(s): "TSH", "T4TOTAL", "FREET4", "T3FREE", "THYROIDAB" in the last 72 hours. Anemia Panel: No results for input(s): "VITAMINB12", "FOLATE", "FERRITIN", "TIBC", "IRON", "RETICCTPCT" in the last 72 hours. Sepsis Labs: Recent Labs  Lab  07/22/23 1951 07/23/23 0047  LATICACIDVEN 1.3 1.3    Recent Results (from the past 240 hours)  Resp panel by RT-PCR (RSV, Flu A&B, Covid) Anterior Nasal Swab     Status: None   Collection Time: 07/22/23  7:22 PM   Specimen: Anterior Nasal Swab  Result Value Ref Range Status   SARS Coronavirus 2 by RT PCR NEGATIVE NEGATIVE Final   Influenza A by PCR NEGATIVE NEGATIVE Final   Influenza B by PCR NEGATIVE NEGATIVE Final    Comment: (NOTE) The Xpert Xpress SARS-CoV-2/FLU/RSV plus assay is intended as an aid in the diagnosis of influenza from Nasopharyngeal swab specimens and should not be used as a sole basis for treatment. Nasal washings and aspirates are unacceptable for Xpert Xpress SARS-CoV-2/FLU/RSV testing.  Fact Sheet for Patients: BloggerCourse.com  Fact Sheet for Healthcare Providers: SeriousBroker.it  This test is not yet approved or cleared by the [place]  FDA and has been authorized for detection and/or diagnosis of SARS-CoV-2 by FDA under an Emergency Use Authorization (EUA). This EUA will remain in effect (meaning this test can be used) for the duration of the COVID-19 declaration under Section 564(b)(1) of the Act, 21 U.S.C. section 360bbb-3(b)(1), unless the authorization is terminated or revoked.     Resp Syncytial Virus by PCR NEGATIVE NEGATIVE Final    Comment: (NOTE) Fact Sheet for Patients: BloggerCourse.com  Fact Sheet for Healthcare Providers: SeriousBroker.it  This test is not yet approved or cleared by the [place]  FDA and has been authorized for detection and/or diagnosis of SARS-CoV-2 by FDA under an Emergency Use Authorization (EUA). This EUA will remain in effect (meaning this test can be used) for the duration of the COVID-19 declaration under Section 564(b)(1) of the Act, 21 U.S.C. section 360bbb-3(b)(1), unless the authorization is  terminated or revoked.  Performed at [facility], [address] [place], [place] [address]   Blood Culture (routine x 2)     Status: None (  Preliminary result)   Collection Time: 07/22/23  7:45 PM   Specimen: BLOOD  Result Value Ref Range Status   Specimen Description BLOOD RIGHT ANTECUBITAL  Final   Special Requests   Final    BOTTLES DRAWN AEROBIC AND ANAEROBIC Blood Culture adequate volume   Culture   Final    NO GROWTH 3 DAYS Performed at [facility], [address], [place], [place] [address]    Report Status PENDING  Incomplete  Blood Culture (routine x 2)     Status: None (Preliminary result)   Collection Time: 07/22/23  7:50 PM   Specimen: BLOOD LEFT FOREARM  Result Value Ref Range Status   Specimen Description BLOOD LEFT FOREARM  Final   Special Requests   Final    BOTTLES DRAWN AEROBIC AND ANAEROBIC Blood Culture adequate volume   Culture   Final    NO GROWTH 3 DAYS Performed at [facility], [address], [place], [place] [address]    Report Status PENDING  Incomplete  Aerobic/Anaerobic Culture w Gram Stain (surgical/deep wound)     Status: None (Preliminary result)   Collection Time: 07/23/23  3:54 PM   Specimen: PATH Amputaion Arm/Leg; Tissue  Result Value Ref Range Status   Specimen Description TISSUE  Final   Special Requests BELOW KNEE AMPUTATION SPEC A  Final   Gram Stain NO WBC SEEN NO ORGANISMS SEEN   Final   Culture   Final    NO GROWTH 2 DAYS Performed at [facility], [address], [place], [place] [address]    Report Status PENDING  Incomplete  Aerobic/Anaerobic Culture w Gram Stain (surgical/deep wound)     Status: None (Preliminary result)   Collection Time: 07/23/23  3:56 PM   Specimen: PATH Amputaion Arm/Leg; Tissue  Result Value Ref Range Status   Specimen Description TISSUE  Final   Special Requests MEDIAN BELOW KNEE AMPUTATION SPEC B  Final   Gram Stain NO WBC SEEN NO ORGANISMS SEEN   Final   Culture    Final    NO GROWTH 2 DAYS Performed at [facility], [address] [place], [place] [address]    Report Status PENDING  Incomplete  Aerobic/Anaerobic Culture w Gram Stain (surgical/deep wound)     Status: None (Preliminary result)   Collection Time: 07/23/23  3:56 PM   Specimen: PATH Amputaion Arm/Leg; Tissue  Result Value Ref Range Status   Specimen Description TISSUE  Final   Special Requests LATERAL BELOW KNEE AMPUTATION SPEC C  Final   Gram Stain   Final    ABUNDANT WBC PRESENT, PREDOMINANTLY PMN NO ORGANISMS SEEN    Culture   Final    NO GROWTH 2 DAYS Performed at [facility], [address] [place], [place] [address]    Report Status PENDING  Incomplete  Surgical pcr screen     Status: None   Collection Time: 07/24/23  6:35 PM   Specimen: Nasal Mucosa; Nasal Swab  Result Value Ref Range Status   MRSA, PCR NEGATIVE NEGATIVE Final   Staphylococcus aureus NEGATIVE NEGATIVE Final    Comment: (NOTE) The Xpert SA Assay (FDA approved for NASAL specimens in patients 72 years of age and older), is one component of a comprehensive surveillance program. It is not intended to diagnose infection nor to guide or monitor treatment. Performed at [facility], [address], [place], [place] [address]     Radiology Studies:  No results found.  Scheduled Meds:  [MAR Hold] vitamin C  1,000 mg Oral Daily   [MAR Hold] heparin  5,000 Units Subcutaneous Q8H   [MAR Hold] insulin  aspart  0-9 Units Subcutaneous TID WC   [MAR Hold] insulin  glargine-yfgn  5 Units Subcutaneous Q24H   [MAR Hold] nutrition supplement (JUVEN)  1 packet Oral BID BM   [MAR Hold] zinc sulfate (50mg  elemental zinc)  220 mg Oral Daily   Continuous Infusions:  sodium chloride  10 mL/hr at 07/23/23 1905    ceFAZolin  (ANCEF ) IV     lactated ringers  10 mL/hr at 07/25/23 0914   [MAR Hold] linezolid (ZYVOX) IV 600 mg (07/25/23 0828)   [MAR Hold] piperacillin-tazobactam (ZOSYN)  IV 3.375 g (07/25/23  0515)     LOS: 3 days    Time spent: 35 mins    Magdalene School, MD [facility]   If 7PM-7AM, please contact night-coverage

## 2023-07-25 NOTE — Interval H&P Note (Signed)
 History and Physical Interval Note:  07/25/2023 6:41 AM  Scott Navarro.  has presented today for surgery, with the diagnosis of Necrotizing Fasciitis Left Leg.  The various methods of treatment have been discussed with the patient and family. After consideration of risks, benefits and other options for treatment, the patient has consented to  Procedure(s) with comments: IRRIGATION AND DEBRIDEMENT KNEE (Left) - DEBRIDEMENT LEFT LEG as a surgical intervention.  The patient's history has been reviewed, patient examined, no change in status, stable for surgery.  I have reviewed the patient's chart and labs.  Questions were answered to the patient's satisfaction.     [name] V Briyah Wheelwright

## 2023-07-25 NOTE — Anesthesia Preprocedure Evaluation (Addendum)
 Anesthesia Evaluation  Patient identified by MRN, date of birth, ID band Patient awake    Reviewed: Allergy & Precautions, NPO status , Patient's Chart, lab work & pertinent test results  History of Anesthesia Complications Negative for: history of anesthetic complications  Airway Mallampati: II  TM Distance: >3 FB Neck ROM: Full    Dental  (+) Poor Dentition, Missing, Dental Advisory Given   Pulmonary neg pulmonary ROS   breath sounds clear to auscultation       Cardiovascular hypertension, Pt. on medications (-) angina  Rhythm:Regular Rate:Normal     Neuro/Psych negative neurological ROS     GI/Hepatic negative GI ROS, Neg liver ROS,,,  Endo/Other  diabetes (glu 180)  Mounjaro : 7d ago  Renal/GU Renal InsufficiencyRenal disease     Musculoskeletal  (+) Arthritis ,    Abdominal   Peds  Hematology  (+) Blood dyscrasia (Hb 9.5, plt 428k)   Anesthesia Other Findings   Reproductive/Obstetrics                             Anesthesia Physical Anesthesia Plan  ASA: 3  Anesthesia Plan: General   Post-op Pain Management: Tylenol  PO (pre-op)*   Induction: Intravenous  PONV Risk Score and Plan: 2 and Ondansetron  and Dexamethasone  Airway Management Planned: Oral ETT  Additional Equipment: None  Intra-op Plan:   Post-operative Plan: Extubation in OR  Informed Consent: I have reviewed the patients History and Physical, chart, labs and discussed the procedure including the risks, benefits and alternatives for the proposed anesthesia with the patient or authorized representative who has indicated his/her understanding and acceptance.     Dental advisory given  Plan Discussed with: CRNA and Surgeon  Anesthesia Plan Comments:         Anesthesia Quick Evaluation

## 2023-07-25 NOTE — Progress Notes (Signed)
 OT Cancellation Note  Patient Details Name: Scott Navarro. MRN: [id number] DOB: April 29, 1965   Cancelled Treatment:    Reason Eval/Treat Not Completed: Patient at procedure or test/ unavailable. Pt off unit at scheduled I & D. Will return as able to.    Denvil Canning C, OT  Acute Rehabilitation Services Office [phone] Secure chat preferred   Mickael Alamo 07/25/2023, 9:55 AM

## 2023-07-25 NOTE — Transfer of Care (Signed)
 Immediate Anesthesia Transfer of Care Note  Patient: Scott Navarro.  Procedure(s) Performed: IRRIGATION AND DEBRIDEMENT LEFT KNEE (Left: Knee)  Patient Location: PACU  Anesthesia Type:General  Level of Consciousness: awake  Airway & Oxygen Therapy: Patient Spontanous Breathing  Post-op Assessment: Report given to RN  Post vital signs: Reviewed and stable  Last Vitals:  Vitals Value Taken Time  BP 169/77 07/25/23 1246  Temp    Pulse 89 07/25/23 1248  Resp 14 07/25/23 1248  SpO2 99 % 07/25/23 1248  Vitals shown include unfiled device data.  Last Pain:  Vitals:   07/25/23 0904  TempSrc:   PainSc: 0-No pain         Complications: No notable events documented.

## 2023-07-26 DIAGNOSIS — M726 Necrotizing fasciitis: Secondary | ICD-10-CM | POA: Diagnosis not present

## 2023-07-26 LAB — BASIC METABOLIC PANEL WITH GFR
Anion gap: 7 (ref 5–15)
BUN: 42 mg/dL — ABNORMAL HIGH (ref 6–20)
CO2: 24 mmol/L (ref 22–32)
Calcium: 7.5 mg/dL — ABNORMAL LOW (ref 8.9–10.3)
Chloride: 105 mmol/L (ref 98–111)
Creatinine, Ser: 1.83 mg/dL — ABNORMAL HIGH (ref 0.61–1.24)
GFR, Estimated: 42 mL/min — ABNORMAL LOW (ref >60)
Glucose, Bld: 228 mg/dL — ABNORMAL HIGH (ref 70–99)
Potassium: 4.4 mmol/L (ref 3.5–5.1)
Sodium: 136 mmol/L (ref 135–145)

## 2023-07-26 LAB — GLUCOSE, CAPILLARY
Glucose-Capillary: 170 mg/dL — ABNORMAL HIGH (ref 70–99)
Glucose-Capillary: 268 mg/dL — ABNORMAL HIGH (ref 70–99)
Glucose-Capillary: 210 mg/dL — ABNORMAL HIGH (ref 70–99)

## 2023-07-26 NOTE — Progress Notes (Signed)
 Patient ID: Scott Life., male   DOB: 05-Dec-1965, 58 y.o.   MRN: [id number] Patient is status post debridement for necrotizing fasciitis left leg.  His below-knee amputation was closed yesterday.  Tissue margins were improved.  There is no drainage in the wound VAC canister.  Anticipate patient will need antibiotic coverage for 3 weeks.  Tissue cultures are pending.  Patient denies any pain at this time.  Possible discharge to inpatient rehab.

## 2023-07-26 NOTE — Plan of Care (Signed)
  Problem: Pain Managment: Goal: General experience of comfort will improve and/or be controlled Outcome: Progressing   Problem: Safety: Goal: Ability to remain free from injury will improve Outcome: Progressing

## 2023-07-26 NOTE — Progress Notes (Signed)
 PROGRESS NOTE    Scott Navarro.  RUE:[id number] DOB: 08/30/1965 DOA: 07/22/2023 PCP: Claudene Crystal, PA-C   Brief Narrative:  This 58 y.o. male with history of diabetes mellitus type 2 presents to the ED because of worsening wound on the left leg.  Patient states about 4 to 5 days ago he noticed small discoloration on the lateral aspect of his left foot which has rapidly progressed involving the whole left foot with discharge and blebs. He denies any recent trauma or insect bites.  Denies fever or chills.  Patient states over the last 1 week he has been feeling poorly with weakness and shortness of breath and some chest tightness. CT scan shows extensive gas formation in the entire left lower extremity and foot.  Dr. Hulda Mage orthopedic surgeon was consulted.  Initial plan was to take patient to OR in the morning, but later decided to continue aggressive IV antibiotics. Patient was admitted for further evaluation.  Assessment & Plan:   Principal Problem:   Necrotizing fasciitis (HCC) Active Problems:   Hypokalemia   Type 2 diabetes mellitus with hyperglycemia (HCC)   Charcot joint of left foot   Anemia   Hyponatremia   ARF (acute renal failure) (HCC)   Hyperlipidemia   Amputation below knee (HCC)  Necrotizing fasciitis of the left lower extremity : Patient presented with LLE cellulitis, necrosis , and foul smelling discharge. Appreciate orthopedic surgery consult. Initial Plan is to take patient to the OR in the morning.   Continue IV Zyvox and Zosyn.  Follow cultures,  Continue hydration , adequate pain control. Patient underwent left transtibial amputation and debridement of necrotizing fasciitis.  POD #3. Patient was taken to OR for incision and drainage and further debridement yesterday. Follow-up postoperative course.  Diabetes mellitus type 2: He takes Mounjaro  at home.  Last hemoglobin A1c was 6.7 a month ago.   Given the acuity of patient's condition , started patient  on insulin  infusion. Start Lantus 5 units daily, regular insulin  sliding scale.  Discontinue insulin  infusion  Essential Hypertension: He takes ARB.  Will keep patient on as needed IV hydralazine for now. Will hold ARB due to AKI.  Chronic Anemia: Appears to be chronic,  Follow CBC.  Hyperlipidemia : Continue Statins.  Acute kidney injury likely from sepsis: Closely monitor intake / output charting,  metabolic panel. Hold ARB,  continue hydration.  Monitor renal functions  Hypokalemia: Replaced, Resolved.  Prolonged Qtc: Replace electrolytes and recheck EKG.  Leukocytosis: Likely secondary to infection. Continue to monitor  Elevated troponin secondary demand ischemia: Continue to trend troponin.  DVT prophylaxis: heparin Code Status: Full code Family Communication: No family at bed side. Disposition Plan:    Status is: Inpatient Remains inpatient appropriate because: Admitted for necrotizing fascitis left lower extremity. Status post left transtibial amputation and debridement of necrotizing fasciitis.  POD # 3    Consultants:  Orthopeadics  Procedures: None Antimicrobials: Anti-infectives (From admission, onward)    Start     Dose/Rate Route Frequency Ordered Stop   07/25/23 0600  ceFAZolin  (ANCEF ) IVPB 2g/100 mL premix        2 g 200 mL/hr over 30 Minutes Intravenous On call to O.R. 07/24/23 1848 07/25/23 1225   07/23/23 1548  vancomycin (VANCOCIN) powder  Status:  Discontinued          As needed 07/23/23 1548 07/23/23 1629   07/23/23 1000  linezolid (ZYVOX) IVPB 600 mg        600 mg 300  mL/hr over 60 Minutes Intravenous Every 12 hours 07/22/23 2324     07/23/23 0400  piperacillin-tazobactam (ZOSYN) IVPB 3.375 g        3.375 g 12.5 mL/hr over 240 Minutes Intravenous Every 8 hours 07/22/23 2323     07/22/23 1930  piperacillin-tazobactam (ZOSYN) IVPB 3.375 g        3.375 g 100 mL/hr over 30 Minutes Intravenous  Once 07/22/23 1922 07/22/23 2110   07/22/23  1930  linezolid (ZYVOX) IVPB 600 mg        600 mg 300 mL/hr over 60 Minutes Intravenous  Once 07/22/23 1922 07/22/23 2200       Subjective: Patient was seen and examined at bedside.Overnight events noted. Patient is status post left transtibial amputation POD # 3, He tolerated procedure well. He reports pain is reasonably controlled,   FS are improving   Objective: Vitals:   07/25/23 1423 07/25/23 2004 07/26/23 0413 07/26/23 0736  BP: 132/71 118/72 123/66 138/63  Pulse: 95 98 88 92  Resp: 17 18 16 16   Temp: 98.1 F (36.7 C) 98.4 F (36.9 C) 98.3 F (36.8 C) 98.2 F (36.8 C)  TempSrc: Oral Oral Oral Oral  SpO2: 98% 99% 99% 98%  Weight:      Height:        Intake/Output Summary (Last 24 hours) at 07/26/2023 1207 Last data filed at 07/26/2023 1000 Gross per 24 hour  Intake 3285.87 ml  Output 2020 ml  Net 1265.87 ml   Filed Weights   07/23/23 1505 07/25/23 0850  Weight: 80.7 kg 84.8 kg    Examination:  General exam: Appears calm and comfortable, not in any acute distress. Respiratory system: CTA Bilaterally. Respiratory effort normal.  RR 15 Cardiovascular system: S1 & S2 heard, RRR. No JVD, murmurs, rubs, gallops or clicks.  Gastrointestinal system: Abdomen is non distended, soft and non tender. Normal bowel sounds heard. Central nervous system: Alert and oriented x 3. No focal neurological deficits. Extremities: Status post left transtibial amputation. POD # 3 Skin: No rashes, lesions or ulcers Psychiatry: Judgement and insight appear normal. Mood & affect appropriate.    Data Reviewed: I have personally reviewed following labs and imaging studies  CBC: Recent Labs  Lab 07/22/23 1950 07/22/23 1952 07/23/23 0410 07/23/23 1611 07/23/23 1952  WBC  --  44.6* 38.7*  --  30.3*  NEUTROABS  --  39.8* 36.0*  --   --   HGB 11.9* 11.0* 9.4* 8.8* 9.5*  HCT 35.0* 32.5* 27.1* 26.0* 27.4*  MCV  --  85.5 85.0  --  85.1  PLT  --  512* 436*  --  428*   Basic Metabolic  Panel: Recent Labs  Lab 07/23/23 0041 07/23/23 0410 07/23/23 1611 07/23/23 1952 07/24/23 0610 07/25/23 0727 07/26/23 0231  NA 134* 135 134*  --  137 139 136  K 3.2* 3.7 4.1  --  3.3* 4.0 4.4  CL 101 102  --   --  104 107 105  CO2 19* 20*  --   --  22 23 24   GLUCOSE 325* 170*  --   --  207* 184* 228*  BUN 61* 57*  --   --  50* 49* 42*  CREATININE 1.93* 1.84*  --  1.74* 1.98* 1.86* 1.83*  CALCIUM  8.1* 8.1*  --   --  7.7* 7.8* 7.5*  MG  --   --   --   --  1.9  --   --   PHOS  --   --   --   --  4.1  --   --    GFR: Estimated Creatinine Clearance: 48.3 mL/min (A) (by C-G formula based on SCr of 1.83 mg/dL (H)). Liver Function Tests: Recent Labs  Lab 07/22/23 1952  AST 63*  ALT 42  ALKPHOS 204*  BILITOT 1.0  PROT 7.0  ALBUMIN 2.0*   No results for input(s): "LIPASE", "AMYLASE" in the last 168 hours. No results for input(s): "AMMONIA" in the last 168 hours. Coagulation Profile: Recent Labs  Lab 07/22/23 1952  INR 1.2   Cardiac Enzymes: Recent Labs  Lab 07/23/23 0410  CKTOTAL 29*   BNP (last 3 results) No results for input(s): "PROBNP" in the last 8760 hours. HbA1C: No results for input(s): "HGBA1C" in the last 72 hours.  CBG: Recent Labs  Lab 07/25/23 1246 07/25/23 1647 07/25/23 2059 07/26/23 0734 07/26/23 1143  GLUCAP 187* 205* 170* 170* 268*   Lipid Profile: No results for input(s): "CHOL", "HDL", "LDLCALC", "TRIG", "CHOLHDL", "LDLDIRECT" in the last 72 hours. Thyroid Function Tests: No results for input(s): "TSH", "T4TOTAL", "FREET4", "T3FREE", "THYROIDAB" in the last 72 hours. Anemia Panel: No results for input(s): "VITAMINB12", "FOLATE", "FERRITIN", "TIBC", "IRON", "RETICCTPCT" in the last 72 hours. Sepsis Labs: Recent Labs  Lab 07/22/23 1951 07/23/23 0047  LATICACIDVEN 1.3 1.3    Recent Results (from the past 240 hours)  Resp panel by RT-PCR (RSV, Flu A&B, Covid) Anterior Nasal Swab     Status: None   Collection Time: 07/22/23  7:22 PM    Specimen: Anterior Nasal Swab  Result Value Ref Range Status   SARS Coronavirus 2 by RT PCR NEGATIVE NEGATIVE Final   Influenza A by PCR NEGATIVE NEGATIVE Final   Influenza B by PCR NEGATIVE NEGATIVE Final    Comment: (NOTE) The Xpert Xpress SARS-CoV-2/FLU/RSV plus assay is intended as an aid in the diagnosis of influenza from Nasopharyngeal swab specimens and should not be used as a sole basis for treatment. Nasal washings and aspirates are unacceptable for Xpert Xpress SARS-CoV-2/FLU/RSV testing.  Fact Sheet for Patients: BloggerCourse.com  Fact Sheet for Healthcare Providers: SeriousBroker.it  This test is not yet approved or cleared by the [place]  FDA and has been authorized for detection and/or diagnosis of SARS-CoV-2 by FDA under an Emergency Use Authorization (EUA). This EUA will remain in effect (meaning this test can be used) for the duration of the COVID-19 declaration under Section 564(b)(1) of the Act, 21 U.S.C. section 360bbb-3(b)(1), unless the authorization is terminated or revoked.     Resp Syncytial Virus by PCR NEGATIVE NEGATIVE Final    Comment: (NOTE) Fact Sheet for Patients: BloggerCourse.com  Fact Sheet for Healthcare Providers: SeriousBroker.it  This test is not yet approved or cleared by the [place]  FDA and has been authorized for detection and/or diagnosis of SARS-CoV-2 by FDA under an Emergency Use Authorization (EUA). This EUA will remain in effect (meaning this test can be used) for the duration of the COVID-19 declaration under Section 564(b)(1) of the Act, 21 U.S.C. section 360bbb-3(b)(1), unless the authorization is terminated or revoked.  Performed at [facility], [address], [place], [place] [address]   Blood Culture (routine x 2)     Status: None (Preliminary result)   Collection Time: 07/22/23  7:45 PM   Specimen:  BLOOD  Result Value Ref Range Status   Specimen Description BLOOD RIGHT ANTECUBITAL  Final   Special Requests   Final    BOTTLES DRAWN AEROBIC AND ANAEROBIC Blood Culture adequate volume   Culture  Final    NO GROWTH 4 DAYS Performed at [facility], [address], [place], [place] [address]    Report Status PENDING  Incomplete  Blood Culture (routine x 2)     Status: None (Preliminary result)   Collection Time: 07/22/23  7:50 PM   Specimen: BLOOD LEFT FOREARM  Result Value Ref Range Status   Specimen Description BLOOD LEFT FOREARM  Final   Special Requests   Final    BOTTLES DRAWN AEROBIC AND ANAEROBIC Blood Culture adequate volume   Culture   Final    NO GROWTH 4 DAYS Performed at [facility], [address], [place], [place] [address]    Report Status PENDING  Incomplete  Aerobic/Anaerobic Culture w Gram Stain (surgical/deep wound)     Status: None (Preliminary result)   Collection Time: 07/23/23  3:54 PM   Specimen: PATH Amputaion Arm/Leg; Tissue  Result Value Ref Range Status   Specimen Description TISSUE  Final   Special Requests BELOW KNEE AMPUTATION SPEC A  Final   Gram Stain NO WBC SEEN NO ORGANISMS SEEN   Final   Culture   Final    NO GROWTH 3 DAYS NO ANAEROBES ISOLATED; CULTURE IN PROGRESS FOR 5 DAYS Performed at [facility], [address] [place], [place] [address]    Report Status PENDING  Incomplete  Aerobic/Anaerobic Culture w Gram Stain (surgical/deep wound)     Status: None (Preliminary result)   Collection Time: 07/23/23  3:56 PM   Specimen: PATH Amputaion Arm/Leg; Tissue  Result Value Ref Range Status   Specimen Description TISSUE  Final   Special Requests MEDIAN BELOW KNEE AMPUTATION SPEC B  Final   Gram Stain NO WBC SEEN NO ORGANISMS SEEN   Final   Culture   Final    NO GROWTH 3 DAYS NO ANAEROBES ISOLATED; CULTURE IN PROGRESS FOR 5 DAYS Performed at [facility], [address] [place], [place] [address]    Report  Status PENDING  Incomplete  Aerobic/Anaerobic Culture w Gram Stain (surgical/deep wound)     Status: None (Preliminary result)   Collection Time: 07/23/23  3:56 PM   Specimen: PATH Amputaion Arm/Leg; Tissue  Result Value Ref Range Status   Specimen Description TISSUE  Final   Special Requests LATERAL BELOW KNEE AMPUTATION SPEC C  Final   Gram Stain   Final    ABUNDANT WBC PRESENT, PREDOMINANTLY PMN NO ORGANISMS SEEN    Culture   Final    NO GROWTH 3 DAYS NO ANAEROBES ISOLATED; CULTURE IN PROGRESS FOR 5 DAYS Performed at [facility], [address] [place], [place] [address]    Report Status PENDING  Incomplete  Surgical pcr screen     Status: None   Collection Time: 07/24/23  6:35 PM   Specimen: Nasal Mucosa; Nasal Swab  Result Value Ref Range Status   MRSA, PCR NEGATIVE NEGATIVE Final   Staphylococcus aureus NEGATIVE NEGATIVE Final    Comment: (NOTE) The Xpert SA Assay (FDA approved for NASAL specimens in patients 27 years of age and older), is one component of a comprehensive surveillance program. It is not intended to diagnose infection nor to guide or monitor treatment. Performed at [facility], [address] [place], [place] [address]     Radiology Studies: No results found.  Scheduled Meds:  vitamin C  1,000 mg Oral Daily   heparin  5,000 Units Subcutaneous Q8H   insulin  aspart  0-15 Units Subcutaneous TID WC   insulin  glargine-yfgn  5 Units Subcutaneous Q24H   nutrition supplement (JUVEN)  1 packet Oral BID BM   zinc sulfate (50mg  elemental zinc)  220 mg Oral Daily   Continuous Infusions:  sodium chloride  10 mL/hr at 07/23/23 1905   sodium chloride  10 mL/hr at 07/25/23 1425   linezolid (ZYVOX) IV 600 mg (07/26/23 0953)   magnesium  sulfate bolus IVPB     piperacillin-tazobactam (ZOSYN)  IV 3.375 g (07/26/23 0551)     LOS: 4 days    Time spent: 35 mins    Magdalene School, MD [facility]   If 7PM-7AM, please contact  night-coverage

## 2023-07-26 NOTE — Progress Notes (Signed)
 Physical Therapy Treatment Patient Details Name: Scott Navarro. MRN: [id number] DOB: [birth year] Today's Date: 07/26/2023   History of Present Illness 58 yo male presents to ED on 5/20 with L foot wound. S/p L BKA, debridement of necrotizing fasciitis in medial/lateral thigh 5/21. I&D of L BKA on 5/23. PMH includes DMII with polyneuropathy and charcot foot.    PT Comments  The pt is making good functional progress and is highly motivated to participate and improve. He was able to progress to only needing supervision for bed mobility when using bed features and was able to progress to transferring to stand and initiating gait training today. He does display deficits in balance and strength that place him at risk for falls though. He had multiple bouts of LOB when trying to hop/ambulate or stand pivot on his R foot and needed modAx2 to recover. Will continue to follow acutely.    If plan is discharge home, recommend the following: A lot of help with walking and/or transfers;A lot of help with bathing/dressing/bathroom;Assist for transportation;Help with stairs or ramp for entrance;Assistance with cooking/housework   Can travel by private vehicle        Equipment Recommendations  Rolling walker (2 wheels);BSC/3in1    Recommendations for Other Services       Precautions / Restrictions Precautions Precautions: Fall Precaution/Restrictions Comments: L LE wound vac, L BKA Restrictions Weight Bearing Restrictions Per Provider Order: Yes LLE Weight Bearing Per Provider Order: Non weight bearing     Mobility  Bed Mobility Overal bed mobility: Needs Assistance Bed Mobility: Supine to Sit, Sit to Supine     Supine to sit: Supervision, Used rails Sit to supine: Supervision, Used rails   General bed mobility comments: Supervision for safety, pt able to manage transitioning supine <> sit L EOB with use of rails and with HOB elevated    Transfers Overall transfer level: Needs  assistance Equipment used: Rolling walker (2 wheels) Transfers: Sit to/from Stand, Bed to chair/wheelchair/BSC Sit to Stand: Min assist, +2 physical assistance, +2 safety/equipment Stand pivot transfers: Min assist, Mod assist, +2 physical assistance, +2 safety/equipment         General transfer comment: Min A for standing at bedside x 2 using RW. cues for hand placement with good carryover. Min A x 2 progressing to Min A x 1 to stand. Min-modAx2 needed for balance and cuing to pivot on R foot with stand pivot to L along EOB, x1 LOB needing modAx2 to recover.    Ambulation/Gait Ambulation/Gait assistance: Mod assist, +2 physical assistance, +2 safety/equipment Gait Distance (Feet): 2 Feet Assistive device: Rolling walker (2 wheels) Gait Pattern/deviations:  (hop-to) Gait velocity: reduced Gait velocity interpretation: <1.31 ft/sec, indicative of household ambulator   General Gait Details: Cued pt to push through arms to off load R leg to allow him to hop with R leg. Pt primarily kept the R toes dragging on the floor when hopping. Multiple LOB bouts noted with modAx2 to recover, especially when hopping backwards.   Stairs             Wheelchair Mobility     Tilt Bed    Modified Rankin (Stroke Patients Only)       Balance Overall balance assessment: Needs assistance Sitting-balance support: No upper extremity supported, Feet supported Sitting balance-Leahy Scale: Fair     Standing balance support: Bilateral upper extremity supported, During functional activity Standing balance-Leahy Scale: Poor  Communication Communication Communication: No apparent difficulties  Cognition Arousal: Alert Behavior During Therapy: WFL for tasks assessed/performed   PT - Cognitive impairments: No apparent impairments                         Following commands: Intact      Cueing Cueing Techniques: Gestural cues, Verbal cues,  Visual cues  Exercises Amputee Exercises Knee Flexion: AROM, Left, 10 reps, Seated Knee Extension: AROM, Left, 10 reps, Seated    General Comments General comments (skin integrity, edema, etc.): OT dropped off [employer] HEP handout created by PT, Access Code: [email or web address] for leg      Pertinent Vitals/Pain Pain Assessment Pain Assessment: No/denies pain    Home Living Family/patient expects to be discharged to:: Private residence Living Arrangements: Spouse/significant other;Children Available Help at Discharge: Family Type of Home: House Home Access: Ramped entrance       Home Layout: One level Home Equipment: Scientist, research (medical) (4 wheels)      Prior Function            PT Goals (current goals can now be found in the care plan section) Acute Rehab PT Goals Patient Stated Goal: to go to [facility] PT Goal Formulation: With patient Time For Goal Achievement: 08/07/23 Potential to Achieve Goals: Good Progress towards PT goals: Progressing toward goals    Frequency    Min 3X/week      PT Plan      Co-evaluation   Reason for Co-Treatment: For patient/therapist safety;To address functional/ADL transfers PT goals addressed during session: Mobility/safety with mobility;Balance;Proper use of DME;Strengthening/ROM OT goals addressed during session: ADL's and self-care;Proper use of Adaptive equipment and DME      AM-PAC PT "6 Clicks" Mobility   Outcome Measure  Help needed turning from your back to your side while in a flat bed without using bedrails?: A Little Help needed moving from lying on your back to sitting on the side of a flat bed without using bedrails?: A Little Help needed moving to and from a bed to a chair (including a wheelchair)?: Total Help needed standing up from a chair using your arms (e.g., wheelchair or bedside chair)?: Total Help needed to walk in hospital room?: Total Help needed climbing 3-5 steps with a railing? : Total 6 Click Score:  10    End of Session Equipment Utilized During Treatment: Gait belt Activity Tolerance: Patient tolerated treatment well Patient left: in bed;with call bell/phone within reach;with bed alarm set Nurse Communication: Mobility status PT Visit Diagnosis: Other abnormalities of gait and mobility (R26.89);Muscle weakness (generalized) (M62.81);Pain;Unsteadiness on feet (R26.81);Difficulty in walking, not elsewhere classified (R26.2) Pain - Right/Left: Left Pain - part of body: Leg     Time: 6578-4696 PT Time Calculation (min) (ACUTE ONLY): 20 min  Charges:    $Therapeutic Activity: 8-22 mins PT General Charges $$ ACUTE PT VISIT: 1 Visit                     Vernida Goodie, PT, DPT Acute Rehabilitation Services  Office: [phone]    Ellyn Hack 07/26/2023, 12:25 PM

## 2023-07-26 NOTE — Evaluation (Signed)
 Occupational Therapy Evaluation Patient Details Name: Scott Navarro. MRN: [id number] DOB: 09-Oct-1965 Today's Date: 07/26/2023   History of Present Illness   58 yo male presents to ED on 5/20 with L foot wound. S/p L BKA, debridement of necrotizing fasciitis in medial/lateral thigh 5/21. I&D of L BKA on 5/23. PMH includes DMII with polyneuropathy and charcot foot.     Clinical Impressions PTA, pt lives with family and typically completely independent with all ADLs, IADLs and mobility without AD. Pt presents now with pain fairly well controlled and eager to work with therapies. Pt able to manage bed mobility w/o assist, required Min A x 1-2 for sit to stand transfers and up to Mod A x 2 for attempts at gait d/t imbalance. Pt requiring Setup for UB ADL and up to Max A for LB ADLs d/t deficits noted below. Provided UE HEP w/ resistance bands to maximize UB strength for ADLs/transfers. Pt hopeful to DC to intensive rehab services and feel pt would make good progress with this setting.      If plan is discharge home, recommend the following:   A lot of help with walking and/or transfers;Two people to help with walking and/or transfers;A lot of help with bathing/dressing/bathroom     Functional Status Assessment   Patient has had a recent decline in their functional status and demonstrates the ability to make significant improvements in function in a reasonable and predictable amount of time.     Equipment Recommendations   Wheelchair (measurements OT);Wheelchair cushion (measurements OT);Other (comment) (RW, drop arm BSC; TBD)     Recommendations for Other Services   Rehab consult     Precautions/Restrictions   Precautions Precautions: Fall Precaution/Restrictions Comments: L LE wound vac Restrictions Weight Bearing Restrictions Per Provider Order: Yes LLE Weight Bearing Per Provider Order: Non weight bearing     Mobility Bed Mobility Overal bed mobility: Needs  Assistance Bed Mobility: Supine to Sit, Sit to Supine     Supine to sit: Supervision, Used rails Sit to supine: Supervision        Transfers Overall transfer level: Needs assistance Equipment used: Rolling walker (2 wheels) Transfers: Sit to/from Stand Sit to Stand: Min assist, +2 physical assistance, +2 safety/equipment           General transfer comment: Min A for standing at bedside x 2 using RW. cues for hand placement with good carryover. Min A x 2 progressing to Min A x 1 to stand      Balance Overall balance assessment: Needs assistance Sitting-balance support: No upper extremity supported, Feet supported Sitting balance-Leahy Scale: Fair     Standing balance support: Bilateral upper extremity supported, During functional activity Standing balance-Leahy Scale: Poor                             ADL either performed or assessed with clinical judgement   ADL Overall ADL's : Needs assistance/impaired Eating/Feeding: Independent   Grooming: Set up;Sitting   Upper Body Bathing: Set up;Sitting   Lower Body Bathing: Moderate assistance;Sitting/lateral leans;Sit to/from stand   Upper Body Dressing : Set up;Sitting   Lower Body Dressing: Maximal assistance;Sitting/lateral leans;Sit to/from stand       Toileting- Architect and Hygiene: Maximal assistance;Sit to/from stand;Sitting/lateral lean               Vision Ability to See in Adequate Light: 0 Adequate Patient Visual Report: No change from baseline Vision Assessment?:  No apparent visual deficits     Perception         Praxis         Pertinent Vitals/Pain Pain Assessment Pain Assessment: No/denies pain     Extremity/Trunk Assessment Upper Extremity Assessment Upper Extremity Assessment: Overall WFL for tasks assessed;Right hand dominant   Lower Extremity Assessment Lower Extremity Assessment: Defer to PT evaluation   Cervical / Trunk Assessment Cervical / Trunk  Assessment: Normal   Communication Communication Communication: No apparent difficulties   Cognition Arousal: Alert Behavior During Therapy: WFL for tasks assessed/performed Cognition: No apparent impairments                               Following commands: Intact       Cueing  General Comments   Cueing Techniques: Gestural cues;Verbal cues;Visual cues      Exercises     Shoulder Instructions      Home Living Family/patient expects to be discharged to:: Private residence Living Arrangements: Spouse/significant other;Children Available Help at Discharge: Family Type of Home: House Home Access: Ramped entrance     Home Layout: One level     Bathroom Shower/Tub: Chief Strategy Officer: Standard Bathroom Accessibility: Yes   Home Equipment: Scientist, research (medical) (4 wheels)          Prior Functioning/Environment Prior Level of Function : Independent/Modified Independent             Mobility Comments: prior to admission, pt help caring for son who suffered from medical issues last year and is currently hoyer-lift level. pt endorses being completely independent ADLs Comments: indep    OT Problem List: Decreased strength;Decreased activity tolerance;Impaired balance (sitting and/or standing);Decreased knowledge of precautions;Decreased knowledge of use of DME or AE   OT Treatment/Interventions: Self-care/ADL training;Therapeutic exercise;Energy conservation;DME and/or AE instruction;Therapeutic activities;Patient/family education;Balance training      OT Goals(Current goals can be found in the care plan section)   Acute Rehab OT Goals Patient Stated Goal: be able to walk with rollator, get a prosthetic OT Goal Formulation: With patient Time For Goal Achievement: 08/09/23 Potential to Achieve Goals: Good   OT Frequency:  Min 2X/week    Co-evaluation PT/OT/SLP Co-Evaluation/Treatment: Yes Reason for Co-Treatment: For  patient/therapist safety;To address functional/ADL transfers   OT goals addressed during session: ADL's and self-care;Proper use of Adaptive equipment and DME      AM-PAC OT "6 Clicks" Daily Activity     Outcome Measure Help from another person eating meals?: None Help from another person taking care of personal grooming?: A Little Help from another person toileting, which includes using toliet, bedpan, or urinal?: A Lot Help from another person bathing (including washing, rinsing, drying)?: A Lot Help from another person to put on and taking off regular upper body clothing?: A Little Help from another person to put on and taking off regular lower body clothing?: A Lot 6 Click Score: 16   End of Session Equipment Utilized During Treatment: Gait belt;Rolling walker (2 wheels) Nurse Communication: Mobility status  Activity Tolerance: Patient tolerated treatment well Patient left: in bed;with call bell/phone within reach;with bed alarm set  OT Visit Diagnosis: Unsteadiness on feet (R26.81);Other abnormalities of gait and mobility (R26.89);Muscle weakness (generalized) (M62.81)                Time: 6045-4098 OT Time Calculation (min): 24 min Charges:  OT General Charges $OT Visit: 1 Visit OT Evaluation $OT  Eval Moderate Complexity: 1 Mod  Lawrence Pretty, OTR/L Acute Rehab Services Office: [phone]   Shireen Dory 07/26/2023, 10:25 AM

## 2023-07-27 DIAGNOSIS — M726 Necrotizing fasciitis: Secondary | ICD-10-CM | POA: Diagnosis not present

## 2023-07-27 LAB — CULTURE, BLOOD (ROUTINE X 2)
Culture: NO GROWTH
Culture: NO GROWTH
Special Requests: ADEQUATE
Special Requests: ADEQUATE

## 2023-07-27 LAB — BASIC METABOLIC PANEL WITH GFR
Anion gap: 7 (ref 5–15)
BUN: 39 mg/dL — ABNORMAL HIGH (ref 6–20)
CO2: 25 mmol/L (ref 22–32)
Calcium: 7.8 mg/dL — ABNORMAL LOW (ref 8.9–10.3)
Chloride: 104 mmol/L (ref 98–111)
Creatinine, Ser: 1.68 mg/dL — ABNORMAL HIGH (ref 0.61–1.24)
GFR, Estimated: 47 mL/min — ABNORMAL LOW (ref >60)
Glucose, Bld: 140 mg/dL — ABNORMAL HIGH (ref 70–99)
Potassium: 4.3 mmol/L (ref 3.5–5.1)
Sodium: 136 mmol/L (ref 135–145)

## 2023-07-27 LAB — GLUCOSE, CAPILLARY
Glucose-Capillary: 179 mg/dL — ABNORMAL HIGH (ref 70–99)
Glucose-Capillary: 196 mg/dL — ABNORMAL HIGH (ref 70–99)
Glucose-Capillary: 220 mg/dL — ABNORMAL HIGH (ref 70–99)
Glucose-Capillary: 186 mg/dL — ABNORMAL HIGH (ref 70–99)

## 2023-07-27 NOTE — Progress Notes (Signed)
 PROGRESS NOTE    Scott Navarro.  ZOX:[id number] DOB: 1965-12-03 DOA: 07/22/2023 PCP: Claudene Crystal, PA-C   Brief Narrative:  This 58 y.o. male with history of diabetes mellitus type 2 presents to the ED because of worsening wound on the left leg.  Patient states about 4 to 5 days ago he noticed small discoloration on the lateral aspect of his left foot which has rapidly progressed involving the whole left foot with discharge and blebs. He denies any recent trauma or insect bites.  Denies fever or chills.  Patient states over the last 1 week he has been feeling poorly with weakness and shortness of breath and some chest tightness. CT scan shows extensive gas formation in the entire left lower extremity and foot.  Dr. Hulda Mage orthopedic surgeon was consulted.  Initial plan was to take patient to OR in the morning, but later decided to continue aggressive IV antibiotics. Patient was admitted for further evaluation.  Assessment & Plan:   Principal Problem:   Necrotizing fasciitis (HCC) Active Problems:   Hypokalemia   Type 2 diabetes mellitus with hyperglycemia (HCC)   Charcot joint of left foot   Anemia   Hyponatremia   ARF (acute renal failure) (HCC)   Hyperlipidemia   Amputation below knee (HCC)  Necrotizing fasciitis of the left lower extremity : Patient presented with LLE cellulitis, necrosis , and foul smelling discharge. Appreciate orthopedic surgery consult. Initial Plan is to take patient to the OR in the morning.   Continue IV Zyvox and Zosyn.  Follow cultures,  Continue hydration , adequate pain control. Patient underwent left transtibial amputation and debridement of necrotizing fasciitis.  POD #4. Patient was taken to OR for incision and drainage and further debridement 5/26 Follow-up postoperative course.  Diabetes mellitus type 2: He takes Mounjaro  at home.  Last hemoglobin A1c was 6.7 a month ago.   Given the acuity of patient's condition , started patient on  insulin  infusion. Start Lantus 5 units daily, regular insulin  sliding scale.  Discontinue insulin  infusion  Essential Hypertension: He takes ARB.  Will keep patient on as needed IV hydralazine for now. Will hold ARB due to AKI.  Chronic Anemia: Appears to be chronic,  Follow CBC.  Hyperlipidemia : Continue Statins.  Acute kidney injury likely from sepsis: Closely monitor intake / output charting,  metabolic panel. Hold ARB,  continue hydration.  Monitor renal functions  Hypokalemia: Replaced, Resolved.  Prolonged Qtc: Replace electrolytes and recheck EKG.  Leukocytosis: Likely secondary to infection. Continue to monitor  Elevated troponin secondary demand ischemia: Continue to trend troponin.  DVT prophylaxis: heparin Code Status: Full code Family Communication: No family at bed side. Disposition Plan:    Status is: Inpatient Remains inpatient appropriate because: Admitted for necrotizing fascitis left lower extremity. Status post left transtibial amputation and debridement of necrotizing fasciitis.  POD # 4    Consultants:  Orthopeadics  Procedures: None Antimicrobials: Anti-infectives (From admission, onward)    Start     Dose/Rate Route Frequency Ordered Stop   07/25/23 0600  ceFAZolin  (ANCEF ) IVPB 2g/100 mL premix        2 g 200 mL/hr over 30 Minutes Intravenous On call to O.R. 07/24/23 1848 07/25/23 1225   07/23/23 1548  vancomycin (VANCOCIN) powder  Status:  Discontinued          As needed 07/23/23 1548 07/23/23 1629   07/23/23 1000  linezolid (ZYVOX) IVPB 600 mg        600 mg 300  mL/hr over 60 Minutes Intravenous Every 12 hours 07/22/23 2324     07/23/23 0400  piperacillin-tazobactam (ZOSYN) IVPB 3.375 g        3.375 g 12.5 mL/hr over 240 Minutes Intravenous Every 8 hours 07/22/23 2323     07/22/23 1930  piperacillin-tazobactam (ZOSYN) IVPB 3.375 g        3.375 g 100 mL/hr over 30 Minutes Intravenous  Once 07/22/23 1922 07/22/23 2110   07/22/23  1930  linezolid (ZYVOX) IVPB 600 mg        600 mg 300 mL/hr over 60 Minutes Intravenous  Once 07/22/23 1922 07/22/23 2200       Subjective: Patient was seen and examined at bedside.Overnight events noted. Patient is status post left transtibial amputation POD # 4, He tolerated procedure well. He reports pain is reasonably controlled,   FS are improving   Objective: Vitals:   07/26/23 1608 07/26/23 1939 07/27/23 0428 07/27/23 0738  BP: 134/65 115/60 131/69 (!) 142/69  Pulse: 95 94 89 92  Resp: 16 16 16 16   Temp: 99 F (37.2 C) 98.3 F (36.8 C)  98.4 F (36.9 C)  TempSrc: Oral Oral  Oral  SpO2: 99% 99% 100% 96%  Weight:      Height:        Intake/Output Summary (Last 24 hours) at 07/27/2023 1229 Last data filed at 07/27/2023 0737 Gross per 24 hour  Intake 240 ml  Output 700 ml  Net -460 ml   Filed Weights   07/23/23 1505 07/25/23 0850  Weight: 80.7 kg 84.8 kg    Examination:  General exam: Appears calm and comfortable, not in any acute distress. Respiratory system: CTA Bilaterally. Respiratory effort normal.  RR 15 Cardiovascular system: S1 & S2 heard, RRR. No JVD, murmurs, rubs, gallops or clicks.  Gastrointestinal system: Abdomen is non distended, soft and non tender. Normal bowel sounds heard. Central nervous system: Alert and oriented x 3. No focal neurological deficits. Extremities: Status post left transtibial amputation. POD # 4 Skin: No rashes, lesions or ulcers Psychiatry: Judgement and insight appear normal. Mood & affect appropriate.    Data Reviewed: I have personally reviewed following labs and imaging studies  CBC: Recent Labs  Lab 07/22/23 1950 07/22/23 1952 07/23/23 0410 07/23/23 1611 07/23/23 1952  WBC  --  44.6* 38.7*  --  30.3*  NEUTROABS  --  39.8* 36.0*  --   --   HGB 11.9* 11.0* 9.4* 8.8* 9.5*  HCT 35.0* 32.5* 27.1* 26.0* 27.4*  MCV  --  85.5 85.0  --  85.1  PLT  --  512* 436*  --  428*   Basic Metabolic Panel: Recent Labs  Lab  07/23/23 0410 07/23/23 1611 07/23/23 1952 07/24/23 0610 07/25/23 0727 07/26/23 0231 07/27/23 0458  NA 135 134*  --  137 139 136 136  K 3.7 4.1  --  3.3* 4.0 4.4 4.3  CL 102  --   --  104 107 105 104  CO2 20*  --   --  22 23 24 25   GLUCOSE 170*  --   --  207* 184* 228* 140*  BUN 57*  --   --  50* 49* 42* 39*  CREATININE 1.84*  --  1.74* 1.98* 1.86* 1.83* 1.68*  CALCIUM  8.1*  --   --  7.7* 7.8* 7.5* 7.8*  MG  --   --   --  1.9  --   --   --   PHOS  --   --   --  4.1  --   --   --    GFR: Estimated Creatinine Clearance: 52.6 mL/min (A) (by C-G formula based on SCr of 1.68 mg/dL (H)). Liver Function Tests: Recent Labs  Lab 07/22/23 1952  AST 63*  ALT 42  ALKPHOS 204*  BILITOT 1.0  PROT 7.0  ALBUMIN 2.0*   No results for input(s): "LIPASE", "AMYLASE" in the last 168 hours. No results for input(s): "AMMONIA" in the last 168 hours. Coagulation Profile: Recent Labs  Lab 07/22/23 1952  INR 1.2   Cardiac Enzymes: Recent Labs  Lab 07/23/23 0410  CKTOTAL 29*   BNP (last 3 results) No results for input(s): "PROBNP" in the last 8760 hours. HbA1C: No results for input(s): "HGBA1C" in the last 72 hours.  CBG: Recent Labs  Lab 07/26/23 0734 07/26/23 1143 07/26/23 1712 07/27/23 0805 07/27/23 1154  GLUCAP 170* 268* 210* 179* 196*   Lipid Profile: No results for input(s): "CHOL", "HDL", "LDLCALC", "TRIG", "CHOLHDL", "LDLDIRECT" in the last 72 hours. Thyroid Function Tests: No results for input(s): "TSH", "T4TOTAL", "FREET4", "T3FREE", "THYROIDAB" in the last 72 hours. Anemia Panel: No results for input(s): "VITAMINB12", "FOLATE", "FERRITIN", "TIBC", "IRON", "RETICCTPCT" in the last 72 hours. Sepsis Labs: Recent Labs  Lab 07/22/23 1951 07/23/23 0047  LATICACIDVEN 1.3 1.3    Recent Results (from the past 240 hours)  Resp panel by RT-PCR (RSV, Flu A&B, Covid) Anterior Nasal Swab     Status: None   Collection Time: 07/22/23  7:22 PM   Specimen: Anterior Nasal  Swab  Result Value Ref Range Status   SARS Coronavirus 2 by RT PCR NEGATIVE NEGATIVE Final   Influenza A by PCR NEGATIVE NEGATIVE Final   Influenza B by PCR NEGATIVE NEGATIVE Final    Comment: (NOTE) The Xpert Xpress SARS-CoV-2/FLU/RSV plus assay is intended as an aid in the diagnosis of influenza from Nasopharyngeal swab specimens and should not be used as a sole basis for treatment. Nasal washings and aspirates are unacceptable for Xpert Xpress SARS-CoV-2/FLU/RSV testing.  Fact Sheet for Patients: BloggerCourse.com  Fact Sheet for Healthcare Providers: SeriousBroker.it  This test is not yet approved or cleared by the [place]  FDA and has been authorized for detection and/or diagnosis of SARS-CoV-2 by FDA under an Emergency Use Authorization (EUA). This EUA will remain in effect (meaning this test can be used) for the duration of the COVID-19 declaration under Section 564(b)(1) of the Act, 21 U.S.C. section 360bbb-3(b)(1), unless the authorization is terminated or revoked.     Resp Syncytial Virus by PCR NEGATIVE NEGATIVE Final    Comment: (NOTE) Fact Sheet for Patients: BloggerCourse.com  Fact Sheet for Healthcare Providers: SeriousBroker.it  This test is not yet approved or cleared by the [place]  FDA and has been authorized for detection and/or diagnosis of SARS-CoV-2 by FDA under an Emergency Use Authorization (EUA). This EUA will remain in effect (meaning this test can be used) for the duration of the COVID-19 declaration under Section 564(b)(1) of the Act, 21 U.S.C. section 360bbb-3(b)(1), unless the authorization is terminated or revoked.  Performed at [facility], [address], [place], [place] [address]   Blood Culture (routine x 2)     Status: None   Collection Time: 07/22/23  7:45 PM   Specimen: BLOOD  Result Value Ref Range Status    Specimen Description BLOOD RIGHT ANTECUBITAL  Final   Special Requests   Final    BOTTLES DRAWN AEROBIC AND ANAEROBIC Blood Culture adequate volume   Culture  Final    NO GROWTH 5 DAYS Performed at [facility], [address] [place], [place] [address]    Report Status 07/27/2023 FINAL  Final  Blood Culture (routine x 2)     Status: None   Collection Time: 07/22/23  7:50 PM   Specimen: BLOOD LEFT FOREARM  Result Value Ref Range Status   Specimen Description BLOOD LEFT FOREARM  Final   Special Requests   Final    BOTTLES DRAWN AEROBIC AND ANAEROBIC Blood Culture adequate volume   Culture   Final    NO GROWTH 5 DAYS Performed at [facility], [address], [place], [place] [address]    Report Status 07/27/2023 FINAL  Final  Aerobic/Anaerobic Culture w Gram Stain (surgical/deep wound)     Status: None (Preliminary result)   Collection Time: 07/23/23  3:54 PM   Specimen: PATH Amputaion Arm/Leg; Tissue  Result Value Ref Range Status   Specimen Description TISSUE  Final   Special Requests BELOW KNEE AMPUTATION SPEC A  Final   Gram Stain NO WBC SEEN NO ORGANISMS SEEN   Final   Culture   Final    NO GROWTH 4 DAYS NO ANAEROBES ISOLATED; CULTURE IN PROGRESS FOR 5 DAYS Performed at [facility], [address], [place], [place] [address]    Report Status PENDING  Incomplete  Aerobic/Anaerobic Culture w Gram Stain (surgical/deep wound)     Status: None (Preliminary result)   Collection Time: 07/23/23  3:56 PM   Specimen: PATH Amputaion Arm/Leg; Tissue  Result Value Ref Range Status   Specimen Description TISSUE  Final   Special Requests MEDIAN BELOW KNEE AMPUTATION SPEC B  Final   Gram Stain NO WBC SEEN NO ORGANISMS SEEN   Final   Culture   Final    NO GROWTH 4 DAYS NO ANAEROBES ISOLATED; CULTURE IN PROGRESS FOR 5 DAYS Performed at [facility], [address], [place], [place] [address]    Report Status PENDING  Incomplete  Aerobic/Anaerobic Culture w  Gram Stain (surgical/deep wound)     Status: None (Preliminary result)   Collection Time: 07/23/23  3:56 PM   Specimen: PATH Amputaion Arm/Leg; Tissue  Result Value Ref Range Status   Specimen Description TISSUE  Final   Special Requests LATERAL BELOW KNEE AMPUTATION SPEC C  Final   Gram Stain   Final    ABUNDANT WBC PRESENT, PREDOMINANTLY PMN NO ORGANISMS SEEN    Culture   Final    NO GROWTH 4 DAYS NO ANAEROBES ISOLATED; CULTURE IN PROGRESS FOR 5 DAYS Performed at [facility], [address], [place], [place] [address]    Report Status PENDING  Incomplete  Surgical pcr screen     Status: None   Collection Time: 07/24/23  6:35 PM   Specimen: Nasal Mucosa; Nasal Swab  Result Value Ref Range Status   MRSA, PCR NEGATIVE NEGATIVE Final   Staphylococcus aureus NEGATIVE NEGATIVE Final    Comment: (NOTE) The Xpert SA Assay (FDA approved for NASAL specimens in patients 6 years of age and older), is one component of a comprehensive surveillance program. It is not intended to diagnose infection nor to guide or monitor treatment. Performed at [facility], [address], [place], [place] [address]     Radiology Studies: No results found.  Scheduled Meds:  vitamin C   1,000 mg Oral Daily   heparin   5,000 Units Subcutaneous Q8H   insulin  aspart  0-15 Units Subcutaneous TID WC   insulin  glargine-yfgn  5 Units Subcutaneous Q24H   nutrition supplement (JUVEN)  1 packet Oral BID BM   zinc  sulfate (50mg  elemental zinc )  220 mg Oral Daily   Continuous Infusions:  sodium chloride  10 mL/hr at 07/23/23 1905   sodium chloride  10 mL/hr at 07/25/23 1425   linezolid  (ZYVOX ) IV 600 mg (07/27/23 1021)   magnesium  sulfate bolus IVPB     piperacillin -tazobactam (ZOSYN )  IV 3.375 g (07/27/23 0610)     LOS: 5 days    Time spent: 35 mins    Magdalene School, MD [facility]   If 7PM-7AM, please contact night-coverage

## 2023-07-27 NOTE — Plan of Care (Signed)
 Problem: Education: Goal: Ability to describe self-care measures that may prevent or decrease complications (Diabetes Survival Skills Education) will improve Outcome: Progressing Goal: Individualized Educational Video(s) Outcome: Progressing   Problem: Coping: Goal: Ability to adjust to condition or change in health will improve Outcome: Progressing   Problem: Fluid Volume: Goal: Ability to maintain a balanced intake and output will improve Outcome: Progressing   Problem: Health Behavior/Discharge Planning: Goal: Ability to identify and utilize available resources and services will improve Outcome: Progressing Goal: Ability to manage health-related needs will improve Outcome: Progressing   Problem: Metabolic: Goal: Ability to maintain appropriate glucose levels will improve Outcome: Progressing   Problem: Nutritional: Goal: Maintenance of adequate nutrition will improve Outcome: Progressing Goal: Progress toward achieving an optimal weight will improve Outcome: Progressing   Problem: Skin Integrity: Goal: Risk for impaired skin integrity will decrease Outcome: Progressing   Problem: Tissue Perfusion: Goal: Adequacy of tissue perfusion will improve Outcome: Progressing   Problem: Education: Goal: Ability to describe self-care measures that may prevent or decrease complications (Diabetes Survival Skills Education) will improve Outcome: Progressing Goal: Individualized Educational Video(s) Outcome: Progressing   Problem: Cardiac: Goal: Ability to maintain an adequate cardiac output will improve Outcome: Progressing   Problem: Health Behavior/Discharge Planning: Goal: Ability to identify and utilize available resources and services will improve Outcome: Progressing Goal: Ability to manage health-related needs will improve Outcome: Progressing   Problem: Fluid Volume: Goal: Ability to achieve a balanced intake and output will improve Outcome: Progressing    Problem: Metabolic: Goal: Ability to maintain appropriate glucose levels will improve Outcome: Progressing   Problem: Nutritional: Goal: Maintenance of adequate nutrition will improve Outcome: Progressing Goal: Maintenance of adequate weight for body size and type will improve Outcome: Progressing   Problem: Respiratory: Goal: Will regain and/or maintain adequate ventilation Outcome: Progressing   Problem: Urinary Elimination: Goal: Ability to achieve and maintain adequate renal perfusion and functioning will improve Outcome: Progressing   Problem: Education: Goal: Knowledge of General Education information will improve Description: Including pain rating scale, medication(s)/side effects and non-pharmacologic comfort measures Outcome: Progressing   Problem: Health Behavior/Discharge Planning: Goal: Ability to manage health-related needs will improve Outcome: Progressing   Problem: Clinical Measurements: Goal: Ability to maintain clinical measurements within normal limits will improve Outcome: Progressing Goal: Will remain free from infection Outcome: Progressing Goal: Diagnostic test results will improve Outcome: Progressing Goal: Respiratory complications will improve Outcome: Progressing Goal: Cardiovascular complication will be avoided Outcome: Progressing   Problem: Activity: Goal: Risk for activity intolerance will decrease Outcome: Progressing   Problem: Nutrition: Goal: Adequate nutrition will be maintained Outcome: Progressing   Problem: Coping: Goal: Level of anxiety will decrease Outcome: Progressing   Problem: Elimination: Goal: Will not experience complications related to bowel motility Outcome: Progressing Goal: Will not experience complications related to urinary retention Outcome: Progressing   Problem: Pain Managment: Goal: General experience of comfort will improve and/or be controlled Outcome: Progressing   Problem: Safety: Goal:  Ability to remain free from injury will improve Outcome: Progressing   Problem: Skin Integrity: Goal: Risk for impaired skin integrity will decrease Outcome: Progressing   Problem: Education: Goal: Knowledge of the prescribed therapeutic regimen will improve Outcome: Progressing Goal: Ability to verbalize activity precautions or restrictions will improve Outcome: Progressing Goal: Understanding of discharge needs will improve Outcome: Progressing   Problem: Activity: Goal: Ability to perform//tolerate increased activity and mobilize with assistive devices will improve Outcome: Progressing   Problem: Clinical Measurements: Goal: Postoperative complications will be avoided or  minimized Outcome: Progressing   Problem: Self-Care: Goal: Ability to meet self-care needs will improve Outcome: Progressing   Problem: Self-Concept: Goal: Ability to maintain and perform role responsibilities to the fullest extent possible will improve Outcome: Progressing

## 2023-07-27 NOTE — Progress Notes (Signed)
 Inpatient Rehab Admissions Coordinator:    I spoke with pt.'s wife to discuss potential CIR admit. She is interested and able to provide 24/7 support at d/c. I will send case to insurance early this week and attempt to get insurance auth.   Wandalee Gust, MS, CCC-SLP Rehab Admissions Coordinator  [phone] (celll) [phone] (office)

## 2023-07-27 NOTE — Plan of Care (Signed)
  Problem: Pain Managment: Goal: General experience of comfort will improve and/or be controlled Outcome: Progressing   Problem: Safety: Goal: Ability to remain free from injury will improve Outcome: Progressing

## 2023-07-27 NOTE — Progress Notes (Signed)
 Patient ID: Scott Life., male   DOB: 10/19/1965, 58 y.o.   MRN: [id number] The patient is awake and alert.  His vital signs are stable.  The VAC over his left BKA stump has a good seal with minimal output.  The patient is very motivated and upbeat.  He reported that he work with PT and OT yesterday and was happy about that process and his progress.

## 2023-07-28 DIAGNOSIS — S88112A Complete traumatic amputation at level between knee and ankle, left lower leg, initial encounter: Secondary | ICD-10-CM

## 2023-07-28 DIAGNOSIS — E1142 Type 2 diabetes mellitus with diabetic polyneuropathy: Secondary | ICD-10-CM | POA: Diagnosis not present

## 2023-07-28 DIAGNOSIS — Z794 Long term (current) use of insulin: Secondary | ICD-10-CM | POA: Diagnosis not present

## 2023-07-28 DIAGNOSIS — M726 Necrotizing fasciitis: Secondary | ICD-10-CM | POA: Diagnosis not present

## 2023-07-28 LAB — AEROBIC/ANAEROBIC CULTURE W GRAM STAIN (SURGICAL/DEEP WOUND)
Culture: NO GROWTH
Culture: NO GROWTH
Culture: NO GROWTH
Gram Stain: NONE SEEN
Gram Stain: NONE SEEN

## 2023-07-28 LAB — GLUCOSE, CAPILLARY
Glucose-Capillary: 156 mg/dL — ABNORMAL HIGH (ref 70–99)
Glucose-Capillary: 121 mg/dL — ABNORMAL HIGH (ref 70–99)
Glucose-Capillary: 192 mg/dL — ABNORMAL HIGH (ref 70–99)
Glucose-Capillary: 138 mg/dL — ABNORMAL HIGH (ref 70–99)

## 2023-07-28 NOTE — Progress Notes (Signed)
 PROGRESS NOTE    Scott Navarro.  ZOX:[id number] DOB: October 04, 1965 DOA: 07/22/2023 PCP: Claudene Crystal, PA-C   Brief Narrative:  This 58 y.o. male with history of diabetes mellitus type 2 presents to the ED because of worsening wound on the left leg.  Patient states about 4 to 5 days ago he noticed small discoloration on the lateral aspect of his left foot which has rapidly progressed involving the whole left foot with discharge and blebs. He denies any recent trauma or insect bites.  Denies fever or chills.  Patient states over the last 1 week he has been feeling poorly with weakness and shortness of breath and some chest tightness. CT scan shows extensive gas formation in the entire left lower extremity and foot.  Dr. Hulda Mage orthopedic surgeon was consulted.  Initial plan was to take patient to OR in the morning, but later decided to continue aggressive IV antibiotics. Patient was admitted for further evaluation.  Assessment & Plan:   Principal Problem:   Necrotizing fasciitis (HCC) Active Problems:   Hypokalemia   Type 2 diabetes mellitus with hyperglycemia (HCC)   Charcot joint of left foot   Anemia   Hyponatremia   ARF (acute renal failure) (HCC)   Hyperlipidemia   Amputation below knee (HCC)  Necrotizing fasciitis of the left lower extremity : Patient presented with LLE cellulitis, necrosis , and foul smelling discharge. Appreciate orthopedic surgery consult. Initial Plan is to take patient to the OR in the morning.   Continue IV Zyvox and Zosyn.  Cultures so far no growth. Continue hydration , adequate pain control. Patient underwent left transtibial amputation and debridement of necrotizing fasciitis.  POD # 5. Patient was taken to OR for incision and drainage and further debridement 5/26 Patient is doing well.  Connected with wound VAC.  Patient is cleared for discharge to acute rehab.  Diabetes mellitus type 2: He takes Mounjaro  at home.  Last hemoglobin A1c was 6.7 a  month ago.   Given the acuity of patient's condition , started patient on insulin  infusion. Continue Lantus 5 units daily, regular insulin  sliding scale. Insulin  infusion discontinued.  Essential Hypertension: He takes ARB.  Will keep patient on as needed IV hydralazine for now. Will hold ARB due to AKI.  Chronic Anemia: Appears to be chronic,  Follow CBC.  Hyperlipidemia : Continue Statins.  Acute kidney injury likely from sepsis: Closely monitor intake / output charting,  metabolic panel. Hold ARB,  continue hydration.  Monitor renal functions  Hypokalemia: Replaced, Resolved.  Prolonged Qtc: Replace electrolytes and recheck EKG.  Leukocytosis: > Improving  Likely secondary to infection. Continue to monitor  Elevated troponin secondary demand ischemia: Troponin trended down.  No ischemic changes  DVT prophylaxis: heparin Code Status: Full code Family Communication: No family at bed side. Disposition Plan:    Status is: Inpatient Remains inpatient appropriate because: Admitted for necrotizing fascitis left lower extremity. Status post left transtibial amputation and debridement of necrotizing fasciitis.  POD # 5. Patient is awaiting transfer to CIR.    Consultants:  Orthopeadics  Procedures: None Antimicrobials: Anti-infectives (From admission, onward)    Start     Dose/Rate Route Frequency Ordered Stop   07/25/23 0600  ceFAZolin  (ANCEF ) IVPB 2g/100 mL premix        2 g 200 mL/hr over 30 Minutes Intravenous On call to O.R. 07/24/23 1848 07/25/23 1225   07/23/23 1548  vancomycin (VANCOCIN) powder  Status:  Discontinued  As needed 07/23/23 1548 07/23/23 1629   07/23/23 1000  linezolid (ZYVOX) IVPB 600 mg        600 mg 300 mL/hr over 60 Minutes Intravenous Every 12 hours 07/22/23 2324     07/23/23 0400  piperacillin-tazobactam (ZOSYN) IVPB 3.375 g        3.375 g 12.5 mL/hr over 240 Minutes Intravenous Every 8 hours 07/22/23 2323     07/22/23 1930   piperacillin-tazobactam (ZOSYN) IVPB 3.375 g        3.375 g 100 mL/hr over 30 Minutes Intravenous  Once 07/22/23 1922 07/22/23 2110   07/22/23 1930  linezolid (ZYVOX) IVPB 600 mg        600 mg 300 mL/hr over 60 Minutes Intravenous  Once 07/22/23 1922 07/22/23 2200       Subjective: Patient was seen and examined at bedside. Overnight events noted. Patient is status post left transtibial amputation POD # 5, He tolerated procedure well. He reports pain is reasonably controlled,   FS are improving.  Objective: Vitals:   07/27/23 1444 07/27/23 1940 07/28/23 0447 07/28/23 0800  BP: 137/73 126/64 128/70 138/67  Pulse: 92 97 86 93  Resp: 16 16 16 18   Temp: 98.5 F (36.9 C) 98.3 F (36.8 C) 98.1 F (36.7 C) 98.3 F (36.8 C)  TempSrc: Oral Oral Oral Oral  SpO2: 100% 100% 100% 100%  Weight:      Height:        Intake/Output Summary (Last 24 hours) at 07/28/2023 1053 Last data filed at 07/28/2023 0834 Gross per 24 hour  Intake 480 ml  Output 500 ml  Net -20 ml   Filed Weights   07/23/23 1505 07/25/23 0850  Weight: 80.7 kg 84.8 kg    Examination:  General exam: Appears calm and comfortable, not in any acute distress. Respiratory system: CTA Bilaterally. Respiratory effort normal.  RR 15 Cardiovascular system: S1 & S2 heard, RRR. No JVD, murmurs, rubs, gallops or clicks.  Gastrointestinal system: Abdomen is non distended, soft and non tender. Normal bowel sounds heard. Central nervous system: Alert and oriented x 3. No focal neurological deficits. Extremities: Status post left transtibial amputation. POD # 5 Skin: No rashes, lesions or ulcers Psychiatry: Judgement and insight appear normal. Mood & affect appropriate.    Data Reviewed: I have personally reviewed following labs and imaging studies  CBC: Recent Labs  Lab 07/22/23 1950 07/22/23 1952 07/23/23 0410 07/23/23 1611 07/23/23 1952  WBC  --  44.6* 38.7*  --  30.3*  NEUTROABS  --  39.8* 36.0*  --   --   HGB  11.9* 11.0* 9.4* 8.8* 9.5*  HCT 35.0* 32.5* 27.1* 26.0* 27.4*  MCV  --  85.5 85.0  --  85.1  PLT  --  512* 436*  --  428*   Basic Metabolic Panel: Recent Labs  Lab 07/23/23 0410 07/23/23 1611 07/23/23 1952 07/24/23 0610 07/25/23 0727 07/26/23 0231 07/27/23 0458  NA 135 134*  --  137 139 136 136  K 3.7 4.1  --  3.3* 4.0 4.4 4.3  CL 102  --   --  104 107 105 104  CO2 20*  --   --  22 23 24 25   GLUCOSE 170*  --   --  207* 184* 228* 140*  BUN 57*  --   --  50* 49* 42* 39*  CREATININE 1.84*  --  1.74* 1.98* 1.86* 1.83* 1.68*  CALCIUM  8.1*  --   --  7.7* 7.8* 7.5*  7.8*  MG  --   --   --  1.9  --   --   --   PHOS  --   --   --  4.1  --   --   --    GFR: Estimated Creatinine Clearance: 52.6 mL/min (A) (by C-G formula based on SCr of 1.68 mg/dL (H)). Liver Function Tests: Recent Labs  Lab 07/22/23 1952  AST 63*  ALT 42  ALKPHOS 204*  BILITOT 1.0  PROT 7.0  ALBUMIN  2.0*   No results for input(s): "LIPASE", "AMYLASE" in the last 168 hours. No results for input(s): "AMMONIA" in the last 168 hours. Coagulation Profile: Recent Labs  Lab 07/22/23 1952  INR 1.2   Cardiac Enzymes: Recent Labs  Lab 07/23/23 0410  CKTOTAL 29*   BNP (last 3 results) No results for input(s): "PROBNP" in the last 8760 hours. HbA1C: No results for input(s): "HGBA1C" in the last 72 hours.  CBG: Recent Labs  Lab 07/27/23 0805 07/27/23 1154 07/27/23 1635 07/27/23 2038 07/28/23 0758  GLUCAP 179* 196* 220* 186* 156*   Lipid Profile: No results for input(s): "CHOL", "HDL", "LDLCALC", "TRIG", "CHOLHDL", "LDLDIRECT" in the last 72 hours. Thyroid Function Tests: No results for input(s): "TSH", "T4TOTAL", "FREET4", "T3FREE", "THYROIDAB" in the last 72 hours. Anemia Panel: No results for input(s): "VITAMINB12", "FOLATE", "FERRITIN", "TIBC", "IRON", "RETICCTPCT" in the last 72 hours. Sepsis Labs: Recent Labs  Lab 07/22/23 1951 07/23/23 0047  LATICACIDVEN 1.3 1.3    Recent Results (from  the past 240 hours)  Resp panel by RT-PCR (RSV, Flu A&B, Covid) Anterior Nasal Swab     Status: None   Collection Time: 07/22/23  7:22 PM   Specimen: Anterior Nasal Swab  Result Value Ref Range Status   SARS Coronavirus 2 by RT PCR NEGATIVE NEGATIVE Final   Influenza A by PCR NEGATIVE NEGATIVE Final   Influenza B by PCR NEGATIVE NEGATIVE Final    Comment: (NOTE) The Xpert Xpress SARS-CoV-2/FLU/RSV plus assay is intended as an aid in the diagnosis of influenza from Nasopharyngeal swab specimens and should not be used as a sole basis for treatment. Nasal washings and aspirates are unacceptable for Xpert Xpress SARS-CoV-2/FLU/RSV testing.  Fact Sheet for Patients: BloggerCourse.com  Fact Sheet for Healthcare Providers: SeriousBroker.it  This test is not yet approved or cleared by the [place]  FDA and has been authorized for detection and/or diagnosis of SARS-CoV-2 by FDA under an Emergency Use Authorization (EUA). This EUA will remain in effect (meaning this test can be used) for the duration of the COVID-19 declaration under Section 564(b)(1) of the Act, 21 U.S.C. section 360bbb-3(b)(1), unless the authorization is terminated or revoked.     Resp Syncytial Virus by PCR NEGATIVE NEGATIVE Final    Comment: (NOTE) Fact Sheet for Patients: BloggerCourse.com  Fact Sheet for Healthcare Providers: SeriousBroker.it  This test is not yet approved or cleared by the [place]  FDA and has been authorized for detection and/or diagnosis of SARS-CoV-2 by FDA under an Emergency Use Authorization (EUA). This EUA will remain in effect (meaning this test can be used) for the duration of the COVID-19 declaration under Section 564(b)(1) of the Act, 21 U.S.C. section 360bbb-3(b)(1), unless the authorization is terminated or revoked.  Performed at [facility], [address]  [place], [place] [address]   Blood Culture (routine x 2)     Status: None   Collection Time: 07/22/23  7:45 PM   Specimen: BLOOD  Result Value Ref  Range Status   Specimen Description BLOOD RIGHT ANTECUBITAL  Final   Special Requests   Final    BOTTLES DRAWN AEROBIC AND ANAEROBIC Blood Culture adequate volume   Culture   Final    NO GROWTH 5 DAYS Performed at [facility], [address] [place], [place] [address]    Report Status 07/27/2023 FINAL  Final  Blood Culture (routine x 2)     Status: None   Collection Time: 07/22/23  7:50 PM   Specimen: BLOOD LEFT FOREARM  Result Value Ref Range Status   Specimen Description BLOOD LEFT FOREARM  Final   Special Requests   Final    BOTTLES DRAWN AEROBIC AND ANAEROBIC Blood Culture adequate volume   Culture   Final    NO GROWTH 5 DAYS Performed at [facility], [address] [place], [place] [address]    Report Status 07/27/2023 FINAL  Final  Aerobic/Anaerobic Culture w Gram Stain (surgical/deep wound)     Status: None (Preliminary result)   Collection Time: 07/23/23  3:54 PM   Specimen: PATH Amputaion Arm/Leg; Tissue  Result Value Ref Range Status   Specimen Description TISSUE  Final   Special Requests BELOW KNEE AMPUTATION SPEC A  Final   Gram Stain NO WBC SEEN NO ORGANISMS SEEN   Final   Culture   Final    NO GROWTH 4 DAYS NO ANAEROBES ISOLATED; CULTURE IN PROGRESS FOR 5 DAYS Performed at [facility], [address] [place], [place] [address]    Report Status PENDING  Incomplete  Aerobic/Anaerobic Culture w Gram Stain (surgical/deep wound)     Status: None (Preliminary result)   Collection Time: 07/23/23  3:56 PM   Specimen: PATH Amputaion Arm/Leg; Tissue  Result Value Ref Range Status   Specimen Description TISSUE  Final   Special Requests MEDIAN BELOW KNEE AMPUTATION SPEC B  Final   Gram Stain NO WBC SEEN NO ORGANISMS SEEN   Final   Culture   Final    NO GROWTH 4 DAYS NO ANAEROBES ISOLATED; CULTURE IN  PROGRESS FOR 5 DAYS Performed at [facility], [address] [place], [place] [address]    Report Status PENDING  Incomplete  Aerobic/Anaerobic Culture w Gram Stain (surgical/deep wound)     Status: None (Preliminary result)   Collection Time: 07/23/23  3:56 PM   Specimen: PATH Amputaion Arm/Leg; Tissue  Result Value Ref Range Status   Specimen Description TISSUE  Final   Special Requests LATERAL BELOW KNEE AMPUTATION SPEC C  Final   Gram Stain   Final    ABUNDANT WBC PRESENT, PREDOMINANTLY PMN NO ORGANISMS SEEN    Culture   Final    NO GROWTH 4 DAYS NO ANAEROBES ISOLATED; CULTURE IN PROGRESS FOR 5 DAYS Performed at [facility], [address] [place], [place] [address]    Report Status PENDING  Incomplete  Surgical pcr screen     Status: None   Collection Time: 07/24/23  6:35 PM   Specimen: Nasal Mucosa; Nasal Swab  Result Value Ref Range Status   MRSA, PCR NEGATIVE NEGATIVE Final   Staphylococcus aureus NEGATIVE NEGATIVE Final    Comment: (NOTE) The Xpert SA Assay (FDA approved for NASAL specimens in patients 49 years of age and older), is one component of a comprehensive surveillance program. It is not intended to diagnose infection nor to guide or monitor treatment. Performed at [facility], [address]  [place], [place] [address]     Radiology Studies: No results found.  Scheduled Meds:  vitamin C  1,000 mg Oral Daily   heparin  5,000 Units Subcutaneous Q8H   insulin  aspart  0-15 Units Subcutaneous TID WC   insulin  glargine-yfgn  5 Units Subcutaneous Q24H   nutrition supplement (JUVEN)  1 packet Oral BID BM   zinc sulfate (50mg  elemental zinc)  220 mg Oral Daily   Continuous Infusions:  sodium chloride  10 mL/hr at 07/23/23 1905   sodium chloride  10 mL/hr at 07/25/23 1425   linezolid (ZYVOX) IV 600 mg (07/28/23 0935)   magnesium  sulfate bolus IVPB     piperacillin-tazobactam (ZOSYN)  IV 3.375 g (07/28/23 0533)     LOS: 6 days     Time spent: 35 mins    Magdalene School, MD [facility]   If 7PM-7AM, please contact night-coverage

## 2023-07-28 NOTE — TOC CM/SW Note (Signed)
 CIR to start insurance auth this week per notes. TOC to follow.  Lashante Fryberger, LCSW Transitions of Care Department [phone]

## 2023-07-28 NOTE — Progress Notes (Signed)
 Patient ID: Scott Navarro., male   DOB: 1965/03/19, 58 y.o.   MRN: [id number] Patient is status post left below-knee amputation for necrotizing fasciitis.  The cultures are negative for 4 days.  Anticipate patient could discharge to inpatient rehab off antibiotics.  There is no drainage in the wound VAC canister but there is not a good suction fit.  Patient will maintain the hospital wound VAC pump at discharge to inpatient rehab.  The VAC can be removed in about a week.

## 2023-07-28 NOTE — Progress Notes (Signed)
 Physical Therapy Treatment Patient Details Name: Scott Navarro. MRN: [id number] DOB: Jul 14, 1965 Today's Date: 07/28/2023   History of Present Illness 58 yo male presents to ED on 5/20 with L foot wound. S/p L BKA, debridement of necrotizing fasciitis in medial/lateral thigh 5/21. I&D of L BKA on 5/23. PMH includes DMII with polyneuropathy and charcot foot.    PT Comments  Pt received in supine, c/o fatigue after recently transfer from chair to bed but agreeable to therapy session to work more on supine/seated/standing exercises for strengthening and gait training with RW and chair follow. Pt quick to fatigue with attempt at hopping and needing up to +2 modA for safety with chair follow. Pt did better with Stedy returning to bed from chair after multiple stands to RW with RLE buckling, RN notified to use Stedy for transfers to BSC/toilet for pt safety. Patient will benefit from intensive inpatient follow-up therapy, >3 hours/day.     If plan is discharge home, recommend the following: A lot of help with walking and/or transfers;A lot of help with bathing/dressing/bathroom;Assist for transportation;Help with stairs or ramp for entrance;Assistance with cooking/housework   Can travel by private vehicle        Equipment Recommendations  Rolling walker (2 wheels);BSC/3in1 (drop arm for [facility])    Recommendations for Other Services       Precautions / Restrictions Precautions Precautions: Fall Recall of Precautions/Restrictions: Intact Precaution/Restrictions Comments: L LE wound vac, L BKA Restrictions Weight Bearing Restrictions Per Provider Order: Yes LLE Weight Bearing Per Provider Order: Non weight bearing Other Position/Activity Restrictions:  (5/26 per surgeon Julio Ohm), ampushield is out of stock currently)     Mobility  Bed Mobility Overal bed mobility: Needs Assistance Bed Mobility: Supine to Sit, Sit to Supine     Supine to sit: Supervision, Used rails, HOB elevated Sit to  supine: Supervision, Used rails   General bed mobility comments: Supervision for safety, pt able to manage transitioning supine <> sit L EOB with heavy use of rails and with HOB elevated    Transfers Overall transfer level: Needs assistance Equipment used: Rolling walker (2 wheels) Transfers: Sit to/from Stand, Bed to chair/wheelchair/BSC Sit to Stand: Min assist, +2 physical assistance, +2 safety/equipment           General transfer comment: elevated bed>RW x2 and close chair follow for safety with gait trial, pt fatigues quickly and chair pulled closer for him to sit. Stood again from chair but RLE buckling soon after so insteady used Stedy for return from chair to bed for pt safety. Transfer via Lift Equipment: Stedy  Ambulation/Gait Ambulation/Gait assistance: Mod assist, +2 physical assistance, +2 safety/equipment Gait Distance (Feet): 5 Feet Assistive device: Rolling walker (2 wheels) Gait Pattern/deviations: Leaning posteriorly, Knees buckling (hop-to; R knee buckle) Gait velocity: reduced     General Gait Details: Cued pt to push through arms to off load R leg to allow him to hop or sway hips forward with R leg getting better clearance. Pt primarily kept the R toes dragging on the floor when hopping. Pt tends to R hip ER and R knee buckling as he fatigues. Attempted standing BP after first gait trial, but pt R knee buckling wtihin 5 seconds of standing so not safe to assess additional trials.   Stairs             Wheelchair Mobility     Tilt Bed    Modified Rankin (Stroke Patients Only)       Balance Overall  balance assessment: Needs assistance Sitting-balance support: No upper extremity supported, Feet supported Sitting balance-Leahy Scale: Fair     Standing balance support: Bilateral upper extremity supported, During functional activity Standing balance-Leahy Scale: Poor Standing balance comment: RW and +2                             Communication Communication Communication: No apparent difficulties  Cognition Arousal: Alert Behavior During Therapy: WFL for tasks assessed/performed   PT - Cognitive impairments: No apparent impairments                       PT - Cognition Comments: possible orthostatic hypotension but pt had difficulty reporting his symptoms during dynamic standing task. Following commands: Intact      Cueing Cueing Techniques: Gestural cues, Verbal cues, Visual cues  Exercises Amputee Exercises Quad Sets: AROM, Left, 5 reps, Supine (bone foam under distal LLE limb end) Knee Flexion: AROM, Left, Seated, 5 reps Knee Extension: AROM, Left, Seated, 5 reps Other Exercises Other Exercises: STS x4 for RLE strengthening    General Comments General comments (skin integrity, edema, etc.): BP 134/73 (89) sitting EOB HR 102 bpm; BP 119/70 (85) sitting in chair post-gait trial HR 106 bpm; BP 121/60 (80) HR 109 bpm sitting upright on Stedy flaps, pt c/o "mild dizziness" when standing but RLE buckling so not able to assess standing BP      Pertinent Vitals/Pain Pain Assessment Pain Assessment: Faces Faces Pain Scale: Hurts a little bit Pain Location: LLE Pain Descriptors / Indicators: Discomfort, Guarding, Grimacing Pain Intervention(s): Monitored during session, Repositioned, Limited activity within patient's tolerance    Home Living                          Prior Function            PT Goals (current goals can now be found in the care plan section) Acute Rehab PT Goals Patient Stated Goal: to go to [facility] PT Goal Formulation: With patient Time For Goal Achievement: 08/07/23 Progress towards PT goals: Progressing toward goals    Frequency    Min 3X/week      PT Plan      Co-evaluation              AM-PAC PT "6 Clicks" Mobility   Outcome Measure  Help needed turning from your back to your side while in a flat bed without using bedrails?: A Little Help  needed moving from lying on your back to sitting on the side of a flat bed without using bedrails?: A Little Help needed moving to and from a bed to a chair (including a wheelchair)?: Total Help needed standing up from a chair using your arms (e.g., wheelchair or bedside chair)?: A Lot Help needed to walk in hospital room?: Total Help needed climbing 3-5 steps with a railing? : Total 6 Click Score: 11    End of Session Equipment Utilized During Treatment: Gait belt;Other (comment) (no ampushield in room, per surgeon they are out of stock) Activity Tolerance: Patient tolerated treatment well;Treatment limited secondary to medical complications (Comment);Other (comment) (mild dizziness while standing, RLE fatigue) Patient left: in bed;with call bell/phone within reach;with bed alarm set Nurse Communication: Mobility status;Need for lift equipment;Other (comment) (rec Stedy for transfer to BSC/bathroom) PT Visit Diagnosis: Other abnormalities of gait and mobility (R26.89);Muscle weakness (generalized) (M62.81);Pain;Unsteadiness on feet (R26.81);Difficulty in walking,  not elsewhere classified (R26.2) Pain - Right/Left: Left Pain - part of body: Leg     Time: 1610-9604 PT Time Calculation (min) (ACUTE ONLY): 34 min  Charges:    $Therapeutic Exercise: 8-22 mins $Therapeutic Activity: 8-22 mins PT General Charges $$ ACUTE PT VISIT: 1 Visit                     Hortensia Duffin P., PTA Acute Rehabilitation Services Secure Chat Preferred 9a-5:30pm Office: [phone]    Mariel Shope [facility] 07/28/2023, 4:46 PM

## 2023-07-28 NOTE — Consult Note (Signed)
 Physical Medicine and Rehabilitation Consult Reason for Consult:CIR/acute rehab/ L BKA Referring Physician: Dr Julio Ohm   HPI: Scott Life. is a 58 y.o. R handed male  with hx of DM- recent A1c 6.7, but was completely uncontrolled prior to 6-12 months ago; HTN, HLD and weakness of Arms and legs for <1 year unknown cause;  Patient admitted 07/22/23 with L foot 1 week hx of  worsening wounds on LLE- when admitted, found to have gas and necrotizing fasciitis of LLE- pt went to OR 5/21 for Amputation but was left open with VAC placed- on 5/23, was taken back to OR for I&D and closure and VAC replacement.   Patient reports pain has been pretty well controlled- taking pain meds 1-2x/day except last Friday when pain required q4 hours pain meds- is now back to 1-2x/day PO pain meds.   He reports LBM this AM using BSC and voiding well using urinal.   Admits having difficulty writing with hands, buttoning up clothes, tying shoes, etc with hand weakness- hasn't noticed any weakness with gait, but has severe TA atrophy.     Social hx lives with pt's wife, who is caring for son who had aortic dissection last winter 6 months ago and 2 strokes- with trach and pt's mother who has advanced alzheimer's- both require 24 hours care- pt's sister and son's mother in law are currently helping and can do so when he goes home.  Is retired.  Walked with cane, but didn't use much until last week before admission due to progressive wounds.    Review of Systems  Constitutional:  Positive for malaise/fatigue.  HENT: Negative.    Eyes: Negative.   Respiratory: Negative.  Negative for cough and shortness of breath.   Cardiovascular: Negative.   Gastrointestinal:  Negative for abdominal pain, constipation, diarrhea and nausea.  Genitourinary: Negative.   Musculoskeletal:  Positive for joint pain.       BKA pain, controlled with meds  Skin: Negative.   Neurological:  Positive for focal weakness and  weakness. Negative for sensory change.  Psychiatric/Behavioral: Negative.    All other systems reviewed and are negative.  Past Medical History:  Diagnosis Date   Type II diabetes mellitus (HCC) dx'd 10/08/2016   Past Surgical History:  Procedure Laterality Date   AMPUTATION Right 10/09/2016   Procedure: INCISION AND DRAINAGE RIGHT GREAT TOE;  Surgeon: Scott Ford, MD;  Location: [facility] OR;  Service: Orthopedics;  Laterality: Right;   AMPUTATION Left 07/23/2023   Procedure: AMPUTATION, BELOW KNEE, LEFT;  Surgeon: Scott Ford, MD;  Location: [facility] OR;  Service: Orthopedics;  Laterality: Left;  IRRIGATION AND DEBRIDEMENT AND AMPUTATION OF LEFT LEG   Family History  Problem Relation Age of Onset   Diabetes Father    Lung cancer Other    Stroke Other    CAD Neg Hx    Hypertension Neg Hx    Social History:  reports that he has never smoked. He has never used smokeless tobacco. He reports that he does not drink alcohol and does not use drugs. Allergies:  Allergies  Allergen Reactions   Metformin  And Related Other (See Comments)    Severe diarrhea   Medications Prior to Admission  Medication Sig Dispense Refill   diclofenac Sodium (VOLTAREN) 1 % GEL Apply 4 g topically as needed (Bilateral leg and hand pain).     naproxen sodium (ALEVE) 220 MG tablet Take 440 mg by mouth as needed.  rosuvastatin  (CRESTOR ) 20 MG tablet Take 1 tablet (20 mg total) by mouth daily. (Patient taking differently: Take 20 mg by mouth at bedtime.) 90 tablet 2   tirzepatide  (MOUNJARO ) 15 MG/0.5ML Pen Inject 15 mg into the skin once a week. 2 mL 5   valsartan  (DIOVAN ) 40 MG tablet Take 1 tablet (40 mg total) by mouth daily. (Patient taking differently: Take 40 mg by mouth at bedtime.) 90 tablet 3    Home: Home Living Family/patient expects to be discharged to:: Private residence Living Arrangements: Spouse/significant other, Children Available Help at Discharge: Family Type of Home: House Home Access: Ramped  entrance Home Layout: One level Bathroom Shower/Tub: Engineer, manufacturing systems: Standard Bathroom Accessibility: Yes Home Equipment: Wheelchair - manual, Rollator (4 wheels)  Functional History: Prior Function Prior Level of Function : Independent/Modified Independent Mobility Comments: prior to admission, pt help caring for son who suffered from medical issues last year and is currently hoyer-lift level. pt endorses being completely independent ADLs Comments: indep Functional Status:  Mobility: Bed Mobility Overal bed mobility: Needs Assistance Bed Mobility: Supine to Sit, Sit to Supine Supine to sit: Supervision, Used rails Sit to supine: Supervision, Used rails General bed mobility comments: Supervision for safety, pt able to manage transitioning supine <> sit L EOB with use of rails and with HOB elevated Transfers Overall transfer level: Needs assistance Equipment used: Rolling walker (2 wheels) Transfers: Sit to/from Stand, Bed to chair/wheelchair/BSC Sit to Stand: Min assist, +2 physical assistance, +2 safety/equipment Bed to/from chair/wheelchair/BSC transfer type:: Stand pivot Stand pivot transfers: Min assist, Mod assist, +2 physical assistance, +2 safety/equipment  Lateral/Scoot Transfers: Min assist General transfer comment: Min A for standing at bedside x 2 using RW. cues for hand placement with good carryover. Min A x 2 progressing to Min A x 1 to stand. Min-modAx2 needed for balance and cuing to pivot on R foot with stand pivot to L along EOB, x1 LOB needing modAx2 to recover. Ambulation/Gait Ambulation/Gait assistance: Mod assist, +2 physical assistance, +2 safety/equipment Gait Distance (Feet): 2 Feet Assistive device: Rolling walker (2 wheels) Gait Pattern/deviations:  (hop-to) General Gait Details: Cued pt to push through arms to off load R leg to allow him to hop with R leg. Pt primarily kept the R toes dragging on the floor when hopping. Multiple LOB bouts  noted with modAx2 to recover, especially when hopping backwards. Gait velocity: reduced Gait velocity interpretation: <1.31 ft/sec, indicative of household ambulator    ADL: ADL Overall ADL's : Needs assistance/impaired Eating/Feeding: Independent Grooming: Set up, Sitting Upper Body Bathing: Set up, Sitting Lower Body Bathing: Moderate assistance, Sitting/lateral leans, Sit to/from stand Upper Body Dressing : Set up, Sitting Lower Body Dressing: Maximal assistance, Sitting/lateral leans, Sit to/from stand Toileting- Clothing Manipulation and Hygiene: Maximal assistance, Sit to/from stand, Sitting/lateral lean  Cognition: Cognition Orientation Level: Oriented X4 Cognition Arousal: Alert Behavior During Therapy: WFL for tasks assessed/performed  Blood pressure 138/67, pulse 93, temperature 98.3 F (36.8 C), temperature source Oral, resp. rate 18, height 6' (1.829 m), weight 84.8 kg, SpO2 100%. Physical Exam Vitals and nursing note reviewed.  Constitutional:      Appearance: Normal appearance. He is normal weight.     Comments: Pt awake, alert, appropriate, sitting up in bed; watching TV, NAD  HENT:     Head: Normocephalic and atraumatic.     Comments: No facial weakness seen B/L    Right Ear: External ear normal.     Left Ear: External ear normal.  Nose: Nose normal. No congestion.     Mouth/Throat:     Mouth: Mucous membranes are dry.     Pharynx: Oropharynx is clear. No oropharyngeal exudate.  Eyes:     General:        Right eye: No discharge.        Left eye: No discharge.     Extraocular Movements: Extraocular movements intact.  Cardiovascular:     Rate and Rhythm: Normal rate and regular rhythm.     Heart sounds: Normal heart sounds. No murmur heard.    No gallop.  Pulmonary:     Effort: Pulmonary effort is normal. No respiratory distress.     Breath sounds: Normal breath sounds. No wheezing, rhonchi or rales.  Abdominal:     General: Bowel sounds are  normal. There is no distension.     Palpations: Abdomen is soft.     Tenderness: There is no abdominal tenderness.  Musculoskeletal:     Cervical back: Neck supple. No tenderness.     Comments: RUE biceps 5/5; triceps 5/5; WE 5-/5; Grip 3+ to 4-/5; using tenodesis to grip; FA 3-/5 LUE- biceps 5/5; Triceps 5/5; WE 5-/5; grip 3+/5- using tenodesis to grip; FA 2+/5 RLE- HF 5/5; KE/KF 5/5; DF 3-/5 at best; PF 3+/5 LLE- HF 5/5; KE/KF at least 3/5- limited by VAC- L BKA in VAC- shaping well expected Tender to palpation Lacking 5 degrees of full extension of L knee  Skin:    General: Skin is warm and dry.     Comments: IV L forearm looks OK L BKA in VAC- looks ok- no drainage in cannister  Neurological:     Mental Status: He is alert and oriented to person, place, and time.     Comments: No reduction in sensation in all 4 extremities  Psychiatric:        Mood and Affect: Mood normal.        Behavior: Behavior normal.     Results for orders placed or performed during the hospital encounter of 07/22/23 (from the past 24 hours)  Glucose, capillary     Status: Abnormal   Collection Time: 07/27/23 11:54 AM  Result Value Ref Range   Glucose-Capillary 196 (H) 70 - 99 mg/dL   Comment 1 Notify RN   Glucose, capillary     Status: Abnormal   Collection Time: 07/27/23  4:35 PM  Result Value Ref Range   Glucose-Capillary 220 (H) 70 - 99 mg/dL  Glucose, capillary     Status: Abnormal   Collection Time: 07/27/23  8:38 PM  Result Value Ref Range   Glucose-Capillary 186 (H) 70 - 99 mg/dL  Glucose, capillary     Status: Abnormal   Collection Time: 07/28/23  7:58 AM  Result Value Ref Range   Glucose-Capillary 156 (H) 70 - 99 mg/dL   No results found.   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 Yes Co-Morbidities requiring supervision/potential complications: DM-, previously poorly controlled- now A1c 6.7; HTN, necrotizing fasciitis; HLD; post op pain Due to bowel management, safety, skin/wound care, disease management, medication administration, pain management, and patient education, does the patient require 24 hr/day rehab nursing? Yes Does the patient require coordinated care of a physician, rehab nurse, therapy disciplines of PT and OT  to address physical and functional deficits in the context of the above medical diagnosis(es)? Yes Addressing deficits in the following areas: balance, endurance, locomotion, strength, transferring, bowel/bladder control, bathing, dressing, feeding, grooming, and toileting Can the patient actively participate in an intensive therapy program of at least 3 hrs of therapy per day at least 5 days per week? Yes The potential for patient to make measurable gains while on inpatient rehab is good Anticipated functional outcomes upon discharge from inpatient rehab are supervision and min assist  with PT, supervision with OT, n/a with SLP. Estimated rehab length of stay to reach the above functional goals is: 10-14 days Anticipated discharge destination: Home Overall Rehab/Functional Prognosis: good  RECOMMENDATIONS: This patient's condition is appropriate for continued rehabilitative care in the following setting: CIR Patient has agreed to participate in recommended program. Yes Note that insurance prior authorization may be required for reimbursement for recommended care.  Comment:  Patient has a severe  Neuropathy- could have been from DM since was uncontrolled prior to 6-12 months ago- but has severe Tibialis anterior, gastroc as well as interossei of hands and feet- and thenar eminence and hypothenar eminence- but could have other cause- will need EMG/NCS once d/c'd from hospital.  Patients pain is currently controlled with PO  meds- taking 1-2x/day Suggest pt has Home Monjaro brought in since so effective controlling his DM- can give weekly- d/w pt.  Suggest pt be able to use his Freestyle [place] since checks BG's so frequently.  AKI improving, but still needs to resolve- latest Cr 1.68.  Complex social situation- will d/w admissions coordinator-   Patient is an appropriate candidate for CIR- will submit for intp rehab, esp due to complicating IV ABX for necrotizing fasciitis as well as severe weakness from neuropathy.  8. Pt.  educated on prosthesis and to prevent contractures.    Thank you for this consult   I spent a total of  83  minutes on total care today- >50% coordination of care- due to  Review of chart, d/w admissions coordinator x2; d/w pt's nurse; as well as review of social hx and H&P interview with pt- also education on BKA/amputation and to prevent contractures- also documentation  Celia Coles, MD 07/28/2023

## 2023-07-28 NOTE — Plan of Care (Signed)
 Problem: Education: Goal: Ability to describe self-care measures that may prevent or decrease complications (Diabetes Survival Skills Education) will improve Outcome: Progressing Goal: Individualized Educational Video(s) Outcome: Progressing   Problem: Coping: Goal: Ability to adjust to condition or change in health will improve Outcome: Progressing   Problem: Fluid Volume: Goal: Ability to maintain a balanced intake and output will improve Outcome: Progressing   Problem: Health Behavior/Discharge Planning: Goal: Ability to identify and utilize available resources and services will improve Outcome: Progressing Goal: Ability to manage health-related needs will improve Outcome: Progressing   Problem: Metabolic: Goal: Ability to maintain appropriate glucose levels will improve Outcome: Progressing   Problem: Nutritional: Goal: Maintenance of adequate nutrition will improve Outcome: Progressing Goal: Progress toward achieving an optimal weight will improve Outcome: Progressing   Problem: Skin Integrity: Goal: Risk for impaired skin integrity will decrease Outcome: Progressing   Problem: Tissue Perfusion: Goal: Adequacy of tissue perfusion will improve Outcome: Progressing   Problem: Education: Goal: Ability to describe self-care measures that may prevent or decrease complications (Diabetes Survival Skills Education) will improve Outcome: Progressing Goal: Individualized Educational Video(s) Outcome: Progressing   Problem: Cardiac: Goal: Ability to maintain an adequate cardiac output will improve Outcome: Progressing   Problem: Health Behavior/Discharge Planning: Goal: Ability to identify and utilize available resources and services will improve Outcome: Progressing Goal: Ability to manage health-related needs will improve Outcome: Progressing   Problem: Fluid Volume: Goal: Ability to achieve a balanced intake and output will improve Outcome: Progressing    Problem: Metabolic: Goal: Ability to maintain appropriate glucose levels will improve Outcome: Progressing   Problem: Nutritional: Goal: Maintenance of adequate nutrition will improve Outcome: Progressing Goal: Maintenance of adequate weight for body size and type will improve Outcome: Progressing   Problem: Respiratory: Goal: Will regain and/or maintain adequate ventilation Outcome: Progressing   Problem: Urinary Elimination: Goal: Ability to achieve and maintain adequate renal perfusion and functioning will improve Outcome: Progressing   Problem: Education: Goal: Knowledge of General Education information will improve Description: Including pain rating scale, medication(s)/side effects and non-pharmacologic comfort measures Outcome: Progressing   Problem: Health Behavior/Discharge Planning: Goal: Ability to manage health-related needs will improve Outcome: Progressing   Problem: Clinical Measurements: Goal: Ability to maintain clinical measurements within normal limits will improve Outcome: Progressing Goal: Will remain free from infection Outcome: Progressing Goal: Diagnostic test results will improve Outcome: Progressing Goal: Respiratory complications will improve Outcome: Progressing Goal: Cardiovascular complication will be avoided Outcome: Progressing   Problem: Activity: Goal: Risk for activity intolerance will decrease Outcome: Progressing   Problem: Nutrition: Goal: Adequate nutrition will be maintained Outcome: Progressing   Problem: Coping: Goal: Level of anxiety will decrease Outcome: Progressing   Problem: Elimination: Goal: Will not experience complications related to bowel motility Outcome: Progressing Goal: Will not experience complications related to urinary retention Outcome: Progressing   Problem: Pain Managment: Goal: General experience of comfort will improve and/or be controlled Outcome: Progressing   Problem: Safety: Goal:  Ability to remain free from injury will improve Outcome: Progressing   Problem: Skin Integrity: Goal: Risk for impaired skin integrity will decrease Outcome: Progressing   Problem: Education: Goal: Knowledge of the prescribed therapeutic regimen will improve Outcome: Progressing Goal: Ability to verbalize activity precautions or restrictions will improve Outcome: Progressing Goal: Understanding of discharge needs will improve Outcome: Progressing   Problem: Activity: Goal: Ability to perform//tolerate increased activity and mobilize with assistive devices will improve Outcome: Progressing   Problem: Clinical Measurements: Goal: Postoperative complications will be avoided or  minimized Outcome: Progressing   Problem: Self-Care: Goal: Ability to meet self-care needs will improve Outcome: Progressing   Problem: Self-Concept: Goal: Ability to maintain and perform role responsibilities to the fullest extent possible will improve Outcome: Progressing

## 2023-07-28 NOTE — Progress Notes (Signed)
 Inpatient Rehab Admissions Coordinator:   CIR following, once pt. Has worked with therapies and rehab MD has entered consult note, will send case to insurance.   Wandalee Gust, MS, CCC-SLP Rehab Admissions Coordinator  [phone] (celll) [phone] (office)

## 2023-07-28 NOTE — Inpatient Diabetes Management (Signed)
 Inpatient Diabetes Program Recommendations  AACE/ADA: New Consensus Statement on Inpatient Glycemic Control (2015)  Target Ranges:  Prepandial:   less than 140 mg/dL      Peak postprandial:   less than 180 mg/dL (1-2 hours)      Critically ill patients:  140 - 180 mg/dL   Lab Results  Component Value Date   GLUCAP 156 (H) 07/28/2023   HGBA1C 6.7 (H) 07/23/2023    Review of Glycemic Control  Diabetes history: DM2 Outpatient Diabetes medications: Mounjaro  15 weekly, previously on Humalog  s/s Current orders for Inpatient glycemic control: Semglee 5 daily, Novolog  0-15 TID  Inpatient Diabetes Program Recommendations:    Discharge Recommendations: Other recommendations: FS Libre [place]man acting recommendations: Insulin  Glargine (LANTUS) Solostar Pen 6 units daily  Short acting recommendations:  Correction coverage ONLY Insulin  lispro (HUMALOG ) KwikPen  Sensitive Scale.   Supply/Referral recommendations: Pen needles - standard   Use Adult Diabetes Insulin  Treatment Post Discharge order set.  Thank you. Joni Net, RD, LDN, CDCES Inpatient Diabetes Coordinator [phone]

## 2023-07-29 ENCOUNTER — Encounter (HOSPITAL_COMMUNITY)

## 2023-07-29 ENCOUNTER — Encounter (HOSPITAL_COMMUNITY): Payer: Self-pay | Admitting: Orthopedic Surgery

## 2023-07-29 DIAGNOSIS — M726 Necrotizing fasciitis: Secondary | ICD-10-CM | POA: Diagnosis not present

## 2023-07-29 LAB — BASIC METABOLIC PANEL WITH GFR
Anion gap: 5 (ref 5–15)
BUN: 40 mg/dL — ABNORMAL HIGH (ref 6–20)
CO2: 30 mmol/L (ref 22–32)
Calcium: 8 mg/dL — ABNORMAL LOW (ref 8.9–10.3)
Chloride: 99 mmol/L (ref 98–111)
Creatinine, Ser: 1.6 mg/dL — ABNORMAL HIGH (ref 0.61–1.24)
GFR, Estimated: 50 mL/min — ABNORMAL LOW (ref >60)
Glucose, Bld: 131 mg/dL — ABNORMAL HIGH (ref 70–99)
Potassium: 4.7 mmol/L (ref 3.5–5.1)
Sodium: 134 mmol/L — ABNORMAL LOW (ref 135–145)

## 2023-07-29 LAB — CBC
HCT: 22.2 % — ABNORMAL LOW (ref 39.0–52.0)
Hemoglobin: 7.3 g/dL — ABNORMAL LOW (ref 13.0–17.0)
MCH: 29.2 pg (ref 26.0–34.0)
MCHC: 32.9 g/dL (ref 30.0–36.0)
MCV: 88.8 fL (ref 80.0–100.0)
Platelets: 418 10*3/uL — ABNORMAL HIGH (ref 150–400)
RBC: 2.5 MIL/uL — ABNORMAL LOW (ref 4.22–5.81)
RDW: 13.6 % (ref 11.5–15.5)
WBC: 10.5 10*3/uL (ref 4.0–10.5)
nRBC: 0 % (ref 0.0–0.2)

## 2023-07-29 LAB — GLUCOSE, CAPILLARY
Glucose-Capillary: 145 mg/dL — ABNORMAL HIGH (ref 70–99)
Glucose-Capillary: 150 mg/dL — ABNORMAL HIGH (ref 70–99)
Glucose-Capillary: 160 mg/dL — ABNORMAL HIGH (ref 70–99)

## 2023-07-29 LAB — MAGNESIUM: Magnesium: 1.8 mg/dL (ref 1.7–2.4)

## 2023-07-29 LAB — SURGICAL PATHOLOGY

## 2023-07-29 NOTE — Progress Notes (Signed)
 Physical Therapy Treatment Patient Details Name: Scott Navarro. MRN: [id number] DOB: 1966/01/18 Today's Date: 07/29/2023   History of Present Illness 58 yo male presents to ED on 5/20 with L foot wound. S/p L BKA, debridement of necrotizing fasciitis in medial/lateral thigh 5/21. I&D of L BKA on 5/23. PMH includes DMII with polyneuropathy and charcot foot.    PT Comments  Pt received EOB and agreeable to session. Pt requires min A for power up to stand and +2 for safety during all OOB mobility. Pt demonstrates impaired balance and difficulty with sequencing and technique with RW during transfer to recliner. Attempted steps forward, however pt declining due to increased pain and fatigue. Pt demonstrates difficulty with UE support on RW to move R foot despite cues for technique. Pt reports some lightheadedness during transfer with drop in BP noted below. Pt continues to benefit from PT services to progress toward functional mobility goals.    If plan is discharge home, recommend the following: A lot of help with walking and/or transfers;A lot of help with bathing/dressing/bathroom;Assist for transportation;Help with stairs or ramp for entrance;Assistance with cooking/housework   Can travel by private vehicle        Equipment Recommendations  Rolling walker (2 wheels);BSC/3in1 (drop arm for [facility])    Recommendations for Other Services       Precautions / Restrictions Precautions Precautions: Fall Recall of Precautions/Restrictions: Intact Precaution/Restrictions Comments: L LE wound vac, L BKA; watch BP Restrictions Weight Bearing Restrictions Per Provider Order: Yes LLE Weight Bearing Per Provider Order: Non weight bearing Other Position/Activity Restrictions: 5/26 per surgeon Julio Ohm), ampushield is out of stock currently     Mobility  Bed Mobility                    Transfers Overall transfer level: Needs assistance Equipment used: Rolling walker (2 wheels) Transfers:  Sit to/from Stand Sit to Stand: Min assist, +2 safety/equipment   Step pivot transfers: Min assist, +2 physical assistance       General transfer comment: min assist to power up and steady from EOB and recliner, cueing for hand placement from EOB but able to recall from recliner. increased assist to step to recliner for RW mgmt    Ambulation/Gait         Gait velocity: reduced     General Gait Details: Pt declined due to increased pain and fatigue   Stairs             Wheelchair Mobility     Tilt Bed    Modified Rankin (Stroke Patients Only)       Balance Overall balance assessment: Needs assistance Sitting-balance support: No upper extremity supported, Feet supported Sitting balance-Leahy Scale: Fair     Standing balance support: Bilateral upper extremity supported, During functional activity, Reliant on assistive device for balance Standing balance-Leahy Scale: Poor Standing balance comment: RW and +2                            Communication Communication Communication: No apparent difficulties  Cognition Arousal: Alert Behavior During Therapy: WFL for tasks assessed/performed   PT - Cognitive impairments: No apparent impairments                         Following commands: Intact      Cueing Cueing Techniques: Verbal cues  Exercises General Exercises - Lower Extremity Quad Sets: AROM, Seated, Left,  5 reps Houchen Arc Quad: AROM, Seated, Both, 10 reps Hip Flexion/Marching: AROM, Seated, Both, 10 reps    General Comments General comments (skin integrity, edema, etc.): BP sitting in recliner 115/66 (81) and standing 94/58 (69) with reports of some lightheadedness      Pertinent Vitals/Pain Pain Assessment Pain Assessment: Faces Faces Pain Scale: Hurts a little bit Pain Location: LLE Pain Descriptors / Indicators: Discomfort, Guarding, Grimacing Pain Intervention(s): Limited activity within patient's tolerance, Monitored  during session, Repositioned     PT Goals (current goals can now be found in the care plan section) Acute Rehab PT Goals Patient Stated Goal: to go to AIR PT Goal Formulation: With patient Time For Goal Achievement: 08/07/23 Progress towards PT goals: Progressing toward goals    Frequency    Min 3X/week       AM-PAC PT "6 Clicks" Mobility   Outcome Measure  Help needed turning from your back to your side while in a flat bed without using bedrails?: A Little Help needed moving from lying on your back to sitting on the side of a flat bed without using bedrails?: A Little Help needed moving to and from a bed to a chair (including a wheelchair)?: A Lot Help needed standing up from a chair using your arms (e.g., wheelchair or bedside chair)?: A Lot Help needed to walk in hospital room?: Total Help needed climbing 3-5 steps with a railing? : Total 6 Click Score: 12    End of Session Equipment Utilized During Treatment: Gait belt Activity Tolerance: Patient tolerated treatment well;Patient limited by pain Patient left: with call bell/phone within reach;in chair;with chair alarm set Nurse Communication: Mobility status PT Visit Diagnosis: Other abnormalities of gait and mobility (R26.89);Muscle weakness (generalized) (M62.81);Pain;Unsteadiness on feet (R26.81);Difficulty in walking, not elsewhere classified (R26.2)     Time: 1610-9604 PT Time Calculation (min) (ACUTE ONLY): 18 min  Charges:    $Therapeutic Activity: 8-22 mins PT General Charges $$ ACUTE PT VISIT: 1 Visit                    Michaelle Adolphus, PTA Acute Rehabilitation Services Secure Chat Preferred  [phone]    Michaelle Adolphus 07/29/2023, 10:26 AM

## 2023-07-29 NOTE — Progress Notes (Signed)
 Inpatient Rehab Admissions Coordinator:    CIR following. I spoke to Pt. And he confirmed he will have assistance from his wife and MIL who live at home and also care for his son and mother who require assistance with ADLs. His daughter can also assist PRN. I will send case to insurance.   Wandalee Gust, MS, CCC-SLP Rehab Admissions Coordinator  [phone] (celll) [phone] (office)

## 2023-07-29 NOTE — Progress Notes (Signed)
 PROGRESS NOTE    Scott Navarro.  NFA:[id number] DOB: 14-Oct-1965 DOA: 07/22/2023 PCP: Claudene Crystal, PA-C   Brief Narrative:  This 58 y.o. male with history of diabetes mellitus type 2 presents to the ED because of worsening wound on the left leg.  Patient states about 4 to 5 days ago he noticed small discoloration on the lateral aspect of his left foot which has rapidly progressed involving the whole left foot with discharge and blebs. He denies any recent trauma or insect bites.  Denies fever or chills.  Patient states over the last 1 week he has been feeling poorly with weakness and shortness of breath and some chest tightness. CT scan shows extensive gas formation in the entire left lower extremity and foot.  Dr. Hulda Mage orthopedic surgeon was consulted.  Initial plan was to take patient to OR in the morning, but later decided to continue aggressive IV antibiotics. Patient was admitted for further evaluation.  Assessment & Plan:   Principal Problem:   Necrotizing fasciitis (HCC) Active Problems:   Hypokalemia   Type 2 diabetes mellitus with hyperglycemia (HCC)   Charcot joint of left foot   Anemia   Hyponatremia   ARF (acute renal failure) (HCC)   Hyperlipidemia   Amputation below knee (HCC)  Necrotizing fasciitis of the left lower extremity : Patient presented with LLE cellulitis, necrosis , and foul smelling discharge. Appreciate orthopedic surgery consult. Initial Plan is to take patient to the OR in the morning.   Continue IV Zyvox and Zosyn.  Cultures so far no growth. Continue hydration , adequate pain control. Patient underwent left transtibial amputation and debridement of necrotizing fasciitis.  POD # 6. Patient was taken to OR for incision and drainage and further debridement 5/26 Patient is doing well.  Connected with wound VAC.  Patient is cleared for discharge to acute rehab. Follow-up Ortho recommendations.  Diabetes mellitus type 2: He takes Mounjaro  at  home.  Last hemoglobin A1c was 6.7 a month ago.   Given the acuity of patient's condition , started patient on insulin  infusion. Continue Lantus 5 units daily, regular insulin  sliding scale. Insulin  infusion discontinued.  Essential Hypertension: He takes ARB.  Will keep patient on as needed IV hydralazine for now. Will hold ARB due to AKI.  Chronic Anemia: Appears to be chronic,  Follow CBC.  Hyperlipidemia : Continue Statins.  Acute kidney injury likely from sepsis: Closely monitor intake / output charting,  metabolic panel. Hold ARB,  continue hydration.  Monitor renal functions  Hypokalemia: Replaced, Resolved.  Prolonged Qtc: Replace electrolytes and recheck EKG.  Leukocytosis: > Resolved. Likely secondary to infection. Continue to monitor  Elevated troponin secondary demand ischemia: Troponin trended down.  No ischemic changes.  DVT prophylaxis: heparin Code Status: Full code Family Communication: No family at bed side. Disposition Plan:    Status is: Inpatient Remains inpatient appropriate because: Admitted for necrotizing fascitis left lower extremity. Status post left transtibial amputation and debridement of necrotizing fasciitis.  POD # 6. Patient is awaiting transfer to CIR.    Consultants:  Orthopeadics  Procedures: None Antimicrobials: Anti-infectives (From admission, onward)    Start     Dose/Rate Route Frequency Ordered Stop   07/25/23 0600  ceFAZolin  (ANCEF ) IVPB 2g/100 mL premix        2 g 200 mL/hr over 30 Minutes Intravenous On call to O.R. 07/24/23 1848 07/25/23 1225   07/23/23 1548  vancomycin (VANCOCIN) powder  Status:  Discontinued  As needed 07/23/23 1548 07/23/23 1629   07/23/23 1000  linezolid (ZYVOX) IVPB 600 mg        600 mg 300 mL/hr over 60 Minutes Intravenous Every 12 hours 07/22/23 2324 07/28/23 2254   07/23/23 0400  piperacillin-tazobactam (ZOSYN) IVPB 3.375 g        3.375 g 12.5 mL/hr over 240 Minutes Intravenous  Every 8 hours 07/22/23 2323 07/29/23 0205   07/22/23 1930  piperacillin-tazobactam (ZOSYN) IVPB 3.375 g        3.375 g 100 mL/hr over 30 Minutes Intravenous  Once 07/22/23 1922 07/22/23 2110   07/22/23 1930  linezolid (ZYVOX) IVPB 600 mg        600 mg 300 mL/hr over 60 Minutes Intravenous  Once 07/22/23 1922 07/22/23 2200       Subjective: Patient was seen and examined at bedside. Overnight events noted. Patient is status post left transtibial amputation POD # 6, He tolerated procedure well. He reports pain is reasonably controlled,   FS are improving. Patient is awaiting transfer to CIR pending insurance authorization  Objective: Vitals:   07/28/23 1617 07/28/23 2020 07/29/23 0449 07/29/23 0700  BP: 134/75 125/62 119/66 (!) 152/72  Pulse: 92 90 84 91  Resp: 18 17 17 17   Temp: 98.1 F (36.7 C) 97.9 F (36.6 C) 98 F (36.7 C) 97.7 F (36.5 C)  TempSrc:  Oral Oral Oral  SpO2: 100% 100% 100% 100%  Weight:      Height:        Intake/Output Summary (Last 24 hours) at 07/29/2023 1249 Last data filed at 07/29/2023 0830 Gross per 24 hour  Intake 3010.29 ml  Output 1100 ml  Net 1910.29 ml   Filed Weights   07/23/23 1505 07/25/23 0850  Weight: 80.7 kg 84.8 kg    Examination:  General exam: Appears calm and comfortable, not in any acute distress. Respiratory system: CTA Bilaterally. Respiratory effort normal.  RR 14 Cardiovascular system: S1 & S2 heard, RRR. No JVD, murmurs, rubs, gallops or clicks.  Gastrointestinal system: Abdomen is non distended, soft and non tender. Normal bowel sounds heard. Central nervous system: Alert and oriented x 3. No focal neurological deficits. Extremities: Status post left transtibial amputation. POD # 6 Skin: No rashes, lesions or ulcers Psychiatry: Judgement and insight appear normal. Mood & affect appropriate.    Data Reviewed: I have personally reviewed following labs and imaging studies  CBC: Recent Labs  Lab 07/22/23 1952  07/23/23 0410 07/23/23 1611 07/23/23 1952 07/29/23 0444  WBC 44.6* 38.7*  --  30.3* 10.5  NEUTROABS 39.8* 36.0*  --   --   --   HGB 11.0* 9.4* 8.8* 9.5* 7.3*  HCT 32.5* 27.1* 26.0* 27.4* 22.2*  MCV 85.5 85.0  --  85.1 88.8  PLT 512* 436*  --  428* 418*   Basic Metabolic Panel: Recent Labs  Lab 07/24/23 0610 07/25/23 0727 07/26/23 0231 07/27/23 0458 07/29/23 0444  NA 137 139 136 136 134*  K 3.3* 4.0 4.4 4.3 4.7  CL 104 107 105 104 99  CO2 22 23 24 25 30   GLUCOSE 207* 184* 228* 140* 131*  BUN 50* 49* 42* 39* 40*  CREATININE 1.98* 1.86* 1.83* 1.68* 1.60*  CALCIUM  7.7* 7.8* 7.5* 7.8* 8.0*  MG 1.9  --   --   --  1.8  PHOS 4.1  --   --   --   --    GFR: Estimated Creatinine Clearance: 55.2 mL/min (A) (by C-G formula based  on SCr of 1.6 mg/dL (H)). Liver Function Tests: Recent Labs  Lab 07/22/23 1952  AST 63*  ALT 42  ALKPHOS 204*  BILITOT 1.0  PROT 7.0  ALBUMIN 2.0*   No results for input(s): "LIPASE", "AMYLASE" in the last 168 hours. No results for input(s): "AMMONIA" in the last 168 hours. Coagulation Profile: Recent Labs  Lab 07/22/23 1952  INR 1.2   Cardiac Enzymes: Recent Labs  Lab 07/23/23 0410  CKTOTAL 29*   BNP (last 3 results) No results for input(s): "PROBNP" in the last 8760 hours. HbA1C: No results for input(s): "HGBA1C" in the last 72 hours.  CBG: Recent Labs  Lab 07/28/23 1209 07/28/23 1618 07/28/23 2038 07/29/23 0801 07/29/23 1234  GLUCAP 121* 192* 138* 145* 150*   Lipid Profile: No results for input(s): "CHOL", "HDL", "LDLCALC", "TRIG", "CHOLHDL", "LDLDIRECT" in the last 72 hours. Thyroid Function Tests: No results for input(s): "TSH", "T4TOTAL", "FREET4", "T3FREE", "THYROIDAB" in the last 72 hours. Anemia Panel: No results for input(s): "VITAMINB12", "FOLATE", "FERRITIN", "TIBC", "IRON", "RETICCTPCT" in the last 72 hours. Sepsis Labs: Recent Labs  Lab 07/22/23 1951 07/23/23 0047  LATICACIDVEN 1.3 1.3    Recent Results  (from the past 240 hours)  Resp panel by RT-PCR (RSV, Flu A&B, Covid) Anterior Nasal Swab     Status: None   Collection Time: 07/22/23  7:22 PM   Specimen: Anterior Nasal Swab  Result Value Ref Range Status   SARS Coronavirus 2 by RT PCR NEGATIVE NEGATIVE Final   Influenza A by PCR NEGATIVE NEGATIVE Final   Influenza B by PCR NEGATIVE NEGATIVE Final    Comment: (NOTE) The Xpert Xpress SARS-CoV-2/FLU/RSV plus assay is intended as an aid in the diagnosis of influenza from Nasopharyngeal swab specimens and should not be used as a sole basis for treatment. Nasal washings and aspirates are unacceptable for Xpert Xpress SARS-CoV-2/FLU/RSV testing.  Fact Sheet for Patients: BloggerCourse.com  Fact Sheet for Healthcare Providers: SeriousBroker.it  This test is not yet approved or cleared by the [place]  FDA and has been authorized for detection and/or diagnosis of SARS-CoV-2 by FDA under an Emergency Use Authorization (EUA). This EUA will remain in effect (meaning this test can be used) for the duration of the COVID-19 declaration under Section 564(b)(1) of the Act, 21 U.S.C. section 360bbb-3(b)(1), unless the authorization is terminated or revoked.     Resp Syncytial Virus by PCR NEGATIVE NEGATIVE Final    Comment: (NOTE) Fact Sheet for Patients: BloggerCourse.com  Fact Sheet for Healthcare Providers: SeriousBroker.it  This test is not yet approved or cleared by the [place]  FDA and has been authorized for detection and/or diagnosis of SARS-CoV-2 by FDA under an Emergency Use Authorization (EUA). This EUA will remain in effect (meaning this test can be used) for the duration of the COVID-19 declaration under Section 564(b)(1) of the Act, 21 U.S.C. section 360bbb-3(b)(1), unless the authorization is terminated or revoked.  Performed at [facility], [address] [place], [place] [address]   Blood Culture (routine x 2)     Status: None   Collection Time: 07/22/23  7:45 PM   Specimen: BLOOD  Result Value Ref Range Status   Specimen Description BLOOD RIGHT ANTECUBITAL  Final   Special Requests   Final    BOTTLES DRAWN AEROBIC AND ANAEROBIC Blood Culture adequate volume   Culture   Final    NO GROWTH 5 DAYS Performed at [facility], [address] [place], [place] [address]  Report Status 07/27/2023 FINAL  Final  Blood Culture (routine x 2)     Status: None   Collection Time: 07/22/23  7:50 PM   Specimen: BLOOD LEFT FOREARM  Result Value Ref Range Status   Specimen Description BLOOD LEFT FOREARM  Final   Special Requests   Final    BOTTLES DRAWN AEROBIC AND ANAEROBIC Blood Culture adequate volume   Culture   Final    NO GROWTH 5 DAYS Performed at [facility], [address] [place], [place] [address]    Report Status 07/27/2023 FINAL  Final  Aerobic/Anaerobic Culture w Gram Stain (surgical/deep wound)     Status: None   Collection Time: 07/23/23  3:54 PM   Specimen: PATH Amputaion Arm/Leg; Tissue  Result Value Ref Range Status   Specimen Description TISSUE  Final   Special Requests BELOW KNEE AMPUTATION SPEC A  Final   Gram Stain NO WBC SEEN NO ORGANISMS SEEN   Final   Culture   Final    No growth aerobically or anaerobically. Performed at [facility], [address] [place], [place] [address]    Report Status 07/28/2023 FINAL  Final  Aerobic/Anaerobic Culture w Gram Stain (surgical/deep wound)     Status: None   Collection Time: 07/23/23  3:56 PM   Specimen: PATH Amputaion Arm/Leg; Tissue  Result Value Ref Range Status   Specimen Description TISSUE  Final   Special Requests MEDIAN BELOW KNEE AMPUTATION SPEC B  Final   Gram Stain NO WBC SEEN NO ORGANISMS SEEN   Final   Culture   Final    No growth aerobically or anaerobically. Performed at [facility], [address], [place], [place]  [address]    Report Status 07/28/2023 FINAL  Final  Aerobic/Anaerobic Culture w Gram Stain (surgical/deep wound)     Status: None   Collection Time: 07/23/23  3:56 PM   Specimen: PATH Amputaion Arm/Leg; Tissue  Result Value Ref Range Status   Specimen Description TISSUE  Final   Special Requests LATERAL BELOW KNEE AMPUTATION SPEC C  Final   Gram Stain   Final    ABUNDANT WBC PRESENT, PREDOMINANTLY PMN NO ORGANISMS SEEN    Culture   Final    No growth aerobically or anaerobically. Performed at [facility], [address] [place], [place] [address]    Report Status 07/28/2023 FINAL  Final  Surgical pcr screen     Status: None   Collection Time: 07/24/23  6:35 PM   Specimen: Nasal Mucosa; Nasal Swab  Result Value Ref Range Status   MRSA, PCR NEGATIVE NEGATIVE Final   Staphylococcus aureus NEGATIVE NEGATIVE Final    Comment: (NOTE) The Xpert SA Assay (FDA approved for NASAL specimens in patients 19 years of age and older), is one component of a comprehensive surveillance program. It is not intended to diagnose infection nor to guide or monitor treatment. Performed at [facility], [address], [place], [place] [address]     Radiology Studies: No results found.  Scheduled Meds:  vitamin C   1,000 mg Oral Daily   heparin   5,000 Units Subcutaneous Q8H   insulin  aspart  0-15 Units Subcutaneous TID WC   insulin  glargine-yfgn  5 Units Subcutaneous Q24H   nutrition supplement (JUVEN)  1 packet Oral BID BM   zinc  sulfate (50mg  elemental zinc )  220 mg Oral Daily   Continuous Infusions:  sodium chloride  10 mL/hr at 07/23/23 1905  sodium chloride  10 mL/hr at 07/25/23 1425   magnesium  sulfate bolus IVPB       LOS: 7 days    Time spent: 35 mins    Magdalene School, MD [facility]   If 7PM-7AM, please contact night-coverage

## 2023-07-29 NOTE — Progress Notes (Signed)
 Occupational Therapy Treatment Patient Details Name: Scott Navarro. MRN: [id number] DOB: [birth year] Today's Date: 07/29/2023   History of present illness 58 yo male presents to ED on 5/20 with L foot wound. S/p L BKA, debridement of necrotizing fasciitis in medial/lateral thigh 5/21. I&D of L BKA on 5/23. PMH includes DMII with polyneuropathy and charcot foot.   OT comments  Pt supine in bed and agreeable to OT session. EOB ADLs with mod assist for LB dressing, able to doff R sock but needs assist and cueing to don; lateral leans for simulated dressing.  Pt requires min assist +2 safety to stand from EOB and recliner; cueing for hand placement from EOB but able to recall from recliner.  Decreased problem solving and sequencing with transfers, poor motor planning.  Will benefit from intensive inpatient setting with >3hrs/day.  Will follow acutely.       If plan is discharge home, recommend the following:  A lot of help with walking and/or transfers;Two people to help with walking and/or transfers;A lot of help with bathing/dressing/bathroom   Equipment Recommendations  Wheelchair (measurements OT);Wheelchair cushion (measurements OT);Other (comment) (RW, drop arm BSC; TBD at next venue)    Recommendations for Other Services Rehab consult    Precautions / Restrictions Precautions Precautions: Fall Recall of Precautions/Restrictions: Intact Precaution/Restrictions Comments: L LE wound vac, L BKA; watch BP Restrictions Weight Bearing Restrictions Per Provider Order: Yes LLE Weight Bearing Per Provider Order: Non weight bearing Other Position/Activity Restrictions: 5/26 per surgeon Julio Ohm), ampushield is out of stock currently       Mobility Bed Mobility Overal bed mobility: Needs Assistance Bed Mobility: Supine to Sit     Supine to sit: Supervision, HOB elevated, Used rails          Transfers Overall transfer level: Needs assistance Equipment used: Rolling walker (2  wheels) Transfers: Sit to/from Stand Sit to Stand: Min assist, +2 safety/equipment     Step pivot transfers: Min assist, +2 physical assistance     General transfer comment: min assist to power up and steady from EOB and recliner, cueing for hand placement from EOB but able to recall from recliner. increased assist to step to recliner for RW mgmt     Balance Overall balance assessment: Needs assistance Sitting-balance support: No upper extremity supported, Feet supported Sitting balance-Leahy Scale: Fair     Standing balance support: Bilateral upper extremity supported, During functional activity, Reliant on assistive device for balance Standing balance-Leahy Scale: Poor Standing balance comment: RW and +2                           ADL either performed or assessed with clinical judgement   ADL Overall ADL's : Needs assistance/impaired     Grooming: Set up;Sitting           Upper Body Dressing : Set up;Sitting   Lower Body Dressing: Moderate assistance;Sitting/lateral leans Lower Body Dressing Details (indicate cue type and reason): pt able to doff R sock, min assist to don.  lateral leans with some assist for simulated clothing mgt Toilet Transfer: Minimal assistance;+2 for safety/equipment;Rolling walker (2 wheels)           Functional mobility during ADLs: Minimal assistance;+2 for safety/equipment      Extremity/Trunk Assessment Upper Extremity Assessment Upper Extremity Assessment: Generalized weakness (noted hand weakness bilaterally with thenar eminence atrophy)   Lower Extremity Assessment Lower Extremity Assessment: Defer to PT evaluation  Vision   Vision Assessment?: No apparent visual deficits   Perception     Praxis     Communication Communication Communication: No apparent difficulties   Cognition Arousal: Alert Behavior During Therapy: WFL for tasks assessed/performed Cognition: Cognition impaired            Executive functioning impairment (select all impairments): Problem solving, Sequencing OT - Cognition Comments: difficulty sequencing and problem sovling mobility, decreased motor planning.   good recall of techniques  for transfers                 Following commands: Intact        Cueing   Cueing Techniques: Verbal cues  Exercises Exercises: Other exercises Other Exercises Other Exercises: chair pushup x 5 reps 2 sets    Shoulder Instructions       General Comments BP in recliner 115/66 due to reports of lightheaded, standing BP 94/58.    Pertinent Vitals/ Pain       Pain Assessment Pain Assessment: Faces Faces Pain Scale: Hurts a little bit Pain Location: LLE Pain Descriptors / Indicators: Discomfort, Guarding, Grimacing Pain Intervention(s): Limited activity within patient's tolerance, Monitored during session, Repositioned  Home Living                                          Prior Functioning/Environment              Frequency  Min 2X/week        Progress Toward Goals  OT Goals(current goals can now be found in the care plan section)  Progress towards OT goals: Progressing toward goals  Acute Rehab OT Goals Patient Stated Goal: get better OT Goal Formulation: With patient Time For Goal Achievement: 08/09/23 Potential to Achieve Goals: Good  Plan      Co-evaluation                 AM-PAC OT "6 Clicks" Daily Activity     Outcome Measure   Help from another person eating meals?: None Help from another person taking care of personal grooming?: A Little Help from another person toileting, which includes using toliet, bedpan, or urinal?: A Lot Help from another person bathing (including washing, rinsing, drying)?: A Lot Help from another person to put on and taking off regular upper body clothing?: A Little Help from another person to put on and taking off regular lower body clothing?: A Lot 6 Click Score: 16     End of Session Equipment Utilized During Treatment: Gait belt;Rolling walker (2 wheels)  OT Visit Diagnosis: Unsteadiness on feet (R26.81);Other abnormalities of gait and mobility (R26.89);Muscle weakness (generalized) (M62.81)   Activity Tolerance Patient tolerated treatment well   Patient Left in chair;with call bell/phone within reach;with chair alarm set;Other (comment) (PT present)   Nurse Communication Mobility status        Time: [phone] OT Time Calculation (min): 17 min  Charges: OT General Charges $OT Visit: 1 Visit OT Treatments $Self Care/Home Management : 8-22 mins  Bary Boss, OT Acute Rehabilitation Services Office [phone] Secure Chat Preferred    Fredrich Jefferson 07/29/2023, 9:25 AM

## 2023-07-29 NOTE — Plan of Care (Signed)
  Problem: Education: Goal: Ability to describe self-care measures that may prevent or decrease complications (Diabetes Survival Skills Education) will improve Outcome: Progressing   Problem: Skin Integrity: Goal: Risk for impaired skin integrity will decrease Outcome: Progressing   Problem: Activity: Goal: Risk for activity intolerance will decrease Outcome: Progressing   Problem: Coping: Goal: Level of anxiety will decrease Outcome: Progressing   Problem: Pain Managment: Goal: General experience of comfort will improve and/or be controlled Outcome: Progressing   Problem: Safety: Goal: Ability to remain free from injury will improve Outcome: Progressing

## 2023-07-29 NOTE — Plan of Care (Signed)
  Problem: Education: Goal: Ability to describe self-care measures that may prevent or decrease complications (Diabetes Survival Skills Education) will improve Outcome: Progressing   Problem: Nutritional: Goal: Maintenance of adequate nutrition will improve Outcome: Progressing   Problem: Skin Integrity: Goal: Risk for impaired skin integrity will decrease Outcome: Progressing   Problem: Tissue Perfusion: Goal: Adequacy of tissue perfusion will improve Outcome: Progressing   Problem: Activity: Goal: Risk for activity intolerance will decrease Outcome: Progressing   Problem: Pain Managment: Goal: General experience of comfort will improve and/or be controlled Outcome: Progressing

## 2023-07-30 ENCOUNTER — Other Ambulatory Visit: Payer: Self-pay

## 2023-07-30 ENCOUNTER — Inpatient Hospital Stay (HOSPITAL_COMMUNITY)
Admission: AD | Admit: 2023-07-30 | Discharge: 2023-08-13 | DRG: 560 | Disposition: A | Discharge status: Home / Self Care | Source: Intra-hospital | Attending: Physical Medicine and Rehabilitation | Admitting: Physical Medicine and Rehabilitation

## 2023-07-30 ENCOUNTER — Encounter (HOSPITAL_COMMUNITY): Payer: Self-pay | Admitting: Orthopedic Surgery

## 2023-07-30 DIAGNOSIS — R9431 Abnormal electrocardiogram [ECG] [EKG]: Secondary | ICD-10-CM | POA: Diagnosis present

## 2023-07-30 DIAGNOSIS — S88112A Complete traumatic amputation at level between knee and ankle, left lower leg, initial encounter: Secondary | ICD-10-CM | POA: Diagnosis not present

## 2023-07-30 DIAGNOSIS — Z7409 Other reduced mobility: Secondary | ICD-10-CM | POA: Diagnosis present

## 2023-07-30 DIAGNOSIS — K5900 Constipation, unspecified: Secondary | ICD-10-CM | POA: Diagnosis not present

## 2023-07-30 DIAGNOSIS — M726 Necrotizing fasciitis: Secondary | ICD-10-CM | POA: Diagnosis present

## 2023-07-30 DIAGNOSIS — R000 Tachycardia, unspecified: Secondary | ICD-10-CM | POA: Diagnosis present

## 2023-07-30 DIAGNOSIS — D62 Acute posthemorrhagic anemia: Secondary | ICD-10-CM | POA: Diagnosis present

## 2023-07-30 DIAGNOSIS — R197 Diarrhea, unspecified: Secondary | ICD-10-CM | POA: Diagnosis not present

## 2023-07-30 DIAGNOSIS — E785 Hyperlipidemia, unspecified: Secondary | ICD-10-CM | POA: Diagnosis present

## 2023-07-30 DIAGNOSIS — D649 Anemia, unspecified: Secondary | ICD-10-CM

## 2023-07-30 DIAGNOSIS — I10 Essential (primary) hypertension: Secondary | ICD-10-CM | POA: Diagnosis present

## 2023-07-30 DIAGNOSIS — Z4781 Encounter for orthopedic aftercare following surgical amputation: Secondary | ICD-10-CM | POA: Diagnosis not present

## 2023-07-30 DIAGNOSIS — Z794 Long term (current) use of insulin: Secondary | ICD-10-CM

## 2023-07-30 DIAGNOSIS — E11621 Type 2 diabetes mellitus with foot ulcer: Secondary | ICD-10-CM | POA: Diagnosis present

## 2023-07-30 DIAGNOSIS — N179 Acute kidney failure, unspecified: Secondary | ICD-10-CM | POA: Diagnosis present

## 2023-07-30 DIAGNOSIS — L97419 Non-pressure chronic ulcer of right heel and midfoot with unspecified severity: Secondary | ICD-10-CM | POA: Diagnosis present

## 2023-07-30 DIAGNOSIS — G608 Other hereditary and idiopathic neuropathies: Secondary | ICD-10-CM | POA: Diagnosis present

## 2023-07-30 DIAGNOSIS — Z833 Family history of diabetes mellitus: Secondary | ICD-10-CM

## 2023-07-30 DIAGNOSIS — E11610 Type 2 diabetes mellitus with diabetic neuropathic arthropathy: Secondary | ICD-10-CM | POA: Diagnosis present

## 2023-07-30 DIAGNOSIS — F54 Psychological and behavioral factors associated with disorders or diseases classified elsewhere: Secondary | ICD-10-CM

## 2023-07-30 DIAGNOSIS — R011 Cardiac murmur, unspecified: Secondary | ICD-10-CM | POA: Diagnosis present

## 2023-07-30 DIAGNOSIS — M62542 Muscle wasting and atrophy, not elsewhere classified, left hand: Secondary | ICD-10-CM | POA: Diagnosis present

## 2023-07-30 DIAGNOSIS — E876 Hypokalemia: Secondary | ICD-10-CM | POA: Diagnosis not present

## 2023-07-30 DIAGNOSIS — I951 Orthostatic hypotension: Secondary | ICD-10-CM | POA: Diagnosis present

## 2023-07-30 DIAGNOSIS — E1140 Type 2 diabetes mellitus with diabetic neuropathy, unspecified: Secondary | ICD-10-CM | POA: Diagnosis not present

## 2023-07-30 DIAGNOSIS — E1165 Type 2 diabetes mellitus with hyperglycemia: Secondary | ICD-10-CM | POA: Diagnosis present

## 2023-07-30 DIAGNOSIS — M62541 Muscle wasting and atrophy, not elsewhere classified, right hand: Secondary | ICD-10-CM | POA: Diagnosis present

## 2023-07-30 DIAGNOSIS — Z888 Allergy status to other drugs, medicaments and biological substances status: Secondary | ICD-10-CM

## 2023-07-30 DIAGNOSIS — Z823 Family history of stroke: Secondary | ICD-10-CM

## 2023-07-30 DIAGNOSIS — Z89512 Acquired absence of left leg below knee: Principal | ICD-10-CM

## 2023-07-30 DIAGNOSIS — Z79899 Other long term (current) drug therapy: Secondary | ICD-10-CM

## 2023-07-30 DIAGNOSIS — D72829 Elevated white blood cell count, unspecified: Secondary | ICD-10-CM | POA: Diagnosis present

## 2023-07-30 DIAGNOSIS — Z801 Family history of malignant neoplasm of trachea, bronchus and lung: Secondary | ICD-10-CM

## 2023-07-30 DIAGNOSIS — E1142 Type 2 diabetes mellitus with diabetic polyneuropathy: Secondary | ICD-10-CM | POA: Diagnosis present

## 2023-07-30 DIAGNOSIS — E871 Hypo-osmolality and hyponatremia: Secondary | ICD-10-CM | POA: Diagnosis present

## 2023-07-30 DIAGNOSIS — Z7985 Long-term (current) use of injectable non-insulin antidiabetic drugs: Secondary | ICD-10-CM

## 2023-07-30 LAB — CBC
HCT: 21.8 % — ABNORMAL LOW (ref 39.0–52.0)
Hemoglobin: 7.1 g/dL — ABNORMAL LOW (ref 13.0–17.0)
MCH: 29.2 pg (ref 26.0–34.0)
MCHC: 32.6 g/dL (ref 30.0–36.0)
MCV: 89.7 fL (ref 80.0–100.0)
Platelets: 442 10*3/uL — ABNORMAL HIGH (ref 150–400)
RBC: 2.43 MIL/uL — ABNORMAL LOW (ref 4.22–5.81)
RDW: 13.7 % (ref 11.5–15.5)
WBC: 11.4 10*3/uL — ABNORMAL HIGH (ref 4.0–10.5)
nRBC: 0 % (ref 0.0–0.2)

## 2023-07-30 LAB — GLUCOSE, CAPILLARY
Glucose-Capillary: 152 mg/dL — ABNORMAL HIGH (ref 70–99)
Glucose-Capillary: 127 mg/dL — ABNORMAL HIGH (ref 70–99)
Glucose-Capillary: 168 mg/dL — ABNORMAL HIGH (ref 70–99)
Glucose-Capillary: 156 mg/dL — ABNORMAL HIGH (ref 70–99)

## 2023-07-30 LAB — BASIC METABOLIC PANEL WITH GFR
Anion gap: 4 — ABNORMAL LOW (ref 5–15)
BUN: 47 mg/dL — ABNORMAL HIGH (ref 6–20)
CO2: 27 mmol/L (ref 22–32)
Calcium: 7.9 mg/dL — ABNORMAL LOW (ref 8.9–10.3)
Chloride: 102 mmol/L (ref 98–111)
Creatinine, Ser: 1.32 mg/dL — ABNORMAL HIGH (ref 0.61–1.24)
GFR, Estimated: >60 mL/min (ref >60)
Glucose, Bld: 145 mg/dL — ABNORMAL HIGH (ref 70–99)
Potassium: 4.5 mmol/L (ref 3.5–5.1)
Sodium: 133 mmol/L — ABNORMAL LOW (ref 135–145)

## 2023-07-30 LAB — PHOSPHORUS: Phosphorus: 3.6 mg/dL (ref 2.5–4.6)

## 2023-07-30 LAB — MAGNESIUM: Magnesium: 2 mg/dL (ref 1.7–2.4)

## 2023-07-30 MED ORDER — FLEET ENEMA RE ENEM: 1.0000 | ENEMA | Freq: Once | RECTAL | Status: DC | PRN

## 2023-07-30 MED ORDER — JUVEN PO PACK
1.0000 | PACK | Freq: Two times a day (BID) | ORAL | Status: DC
  Administered 2023-07-30 – 2023-08-13 (×28): 1 via ORAL
  Filled 2023-07-30 (×29): qty 1

## 2023-07-30 MED ORDER — ROSUVASTATIN CALCIUM 20 MG PO TABS
20.0000 mg | ORAL_TABLET | Freq: Every day | ORAL | Status: DC
  Administered 2023-07-31 – 2023-08-12 (×13): 20 mg via ORAL
  Filled 2023-07-30 (×13): qty 1

## 2023-07-30 MED ORDER — ALUM & MAG HYDROXIDE-SIMETH 200-200-20 MG/5ML PO SUSP: 30.0000 mL | ORAL | Status: DC | PRN

## 2023-07-30 MED ORDER — ZINC SULFATE 220 (50 ZN) MG PO CAPS
220.0000 mg | ORAL_CAPSULE | Freq: Every day | ORAL | Status: DC
  Administered 2023-07-31 – 2023-08-12 (×13): 220 mg via ORAL
  Filled 2023-07-30 (×13): qty 1

## 2023-07-30 MED ORDER — ACETAMINOPHEN 325 MG PO TABS: 325.0000 mg | ORAL_TABLET | ORAL | Status: DC | PRN

## 2023-07-30 MED ORDER — TRAZODONE HCL 50 MG PO TABS: 25.0000 mg | ORAL_TABLET | Freq: Every evening | ORAL | Status: DC | PRN

## 2023-07-30 MED ORDER — DIPHENHYDRAMINE HCL 25 MG PO CAPS: 25.0000 mg | ORAL_CAPSULE | Freq: Four times a day (QID) | ORAL | Status: DC | PRN

## 2023-07-30 MED ORDER — ENOXAPARIN SODIUM 40 MG/0.4ML IJ SOSY
40.0000 mg | PREFILLED_SYRINGE | INTRAMUSCULAR | Status: DC
Start: 2023-07-30 — End: 2023-08-01
  Administered 2023-07-30 – 2023-08-01 (×3): 40 mg via SUBCUTANEOUS
  Filled 2023-07-30 (×5): qty 0.4

## 2023-07-30 MED ORDER — INSULIN ASPART 100 UNIT/ML IJ SOLN: 0.0000 [IU] | Freq: Every day | INTRAMUSCULAR | Status: DC

## 2023-07-30 MED ORDER — NON FORMULARY: 15.0000 mg | Status: DC

## 2023-07-30 MED ORDER — INSULIN GLARGINE-YFGN 100 UNIT/ML ~~LOC~~ SOLN
5.0000 [IU] | SUBCUTANEOUS | Status: DC
  Administered 2023-07-30: 5 [IU] via SUBCUTANEOUS
  Filled 2023-07-30 (×2): qty 0.05

## 2023-07-30 MED ORDER — OXYCODONE HCL 5 MG PO TABS
10.0000 mg | ORAL_TABLET | ORAL | Status: DC | PRN
  Administered 2023-07-30 – 2023-08-12 (×14): 10 mg via ORAL
  Administered 2023-08-13: 15 mg via ORAL
  Filled 2023-07-30: qty 2
  Filled 2023-07-30 (×2): qty 3
  Filled 2023-07-30 (×3): qty 2
  Filled 2023-07-30: qty 3
  Filled 2023-07-30 (×5): qty 2
  Filled 2023-07-30: qty 3
  Filled 2023-07-30 (×3): qty 2

## 2023-07-30 MED ORDER — INSULIN ASPART 100 UNIT/ML IJ SOLN
0.0000 [IU] | Freq: Three times a day (TID) | INTRAMUSCULAR | Status: DC
  Administered 2023-07-30 – 2023-07-31 (×2): 2 [IU] via SUBCUTANEOUS
  Administered 2023-07-31: 1 [IU] via SUBCUTANEOUS
  Administered 2023-07-31: 3 [IU] via SUBCUTANEOUS

## 2023-07-30 MED ORDER — GUAIFENESIN-DM 100-10 MG/5ML PO SYRP: 5.0000 mL | ORAL_SOLUTION | Freq: Four times a day (QID) | ORAL | Status: DC | PRN

## 2023-07-30 MED ORDER — PROCHLORPERAZINE 25 MG RE SUPP
12.5000 mg | Freq: Four times a day (QID) | RECTAL | Status: DC | PRN
Start: 2023-07-30 — End: 2023-08-13

## 2023-07-30 MED ORDER — TIRZEPATIDE 15 MG/0.5ML ~~LOC~~ SOAJ: 15.0000 mg | SUBCUTANEOUS | Status: DC

## 2023-07-30 MED ORDER — VITAMIN C 500 MG PO TABS
1000.0000 mg | ORAL_TABLET | Freq: Every day | ORAL | Status: DC
  Administered 2023-07-31 – 2023-08-13 (×14): 1000 mg via ORAL
  Filled 2023-07-30 (×14): qty 2

## 2023-07-30 MED ORDER — PROCHLORPERAZINE MALEATE 5 MG PO TABS: 5.0000 mg | ORAL_TABLET | Freq: Four times a day (QID) | ORAL | Status: DC | PRN

## 2023-07-30 MED ORDER — PROCHLORPERAZINE EDISYLATE 10 MG/2ML IJ SOLN: 5.0000 mg | Freq: Four times a day (QID) | INTRAMUSCULAR | Status: DC | PRN

## 2023-07-30 MED ORDER — BISACODYL 10 MG RE SUPP: 10.0000 mg | Freq: Every day | RECTAL | Status: DC | PRN

## 2023-07-30 NOTE — H&P (Signed)
 Physical Medicine and Rehabilitation Admission H&P    Chief Complaint  Patient presents with   Functional Deficits r/t Left BKA   : HPI: Scott Navarrois a 58 year old right handed Caucasian male with significant PMHX of DM type 2 (recent A1c 6.7), Charcot arthropathy, chronic plantar ulceration, HTN, HLD and anemia.  Presented on 07/22/2023 due to 4-5 days of worsening wound on left foot along with some generalized weakness.  Patient reported small area of discoloration on the left lateral foot area rapidly progressed, now involving the entire foot. CT scan showed extensive soft tissue gas formation throughout the left leg and foot.  Admission labs showed WBC 44, hgb 11, creatinine 2.3, glucose 341, bicarb 23, lactic acid 1.3, potassium 3.2.  Chest xray unremarkable, EKG-sinus tachycardia.  On 07/23/23 Dr. Julio Ohm performed left transtibial amputation and debridement of necrotizing fasciitis; patient was left open back placed. Pt returned to OR on 07/25/2023 for I&D. Prevena wound VAC secured upon closure. POD #7.  Social hx: Retired from [employer], lives at home with wife and son who has extensive medical needs. PTA pt walked with cane intermittently otherwise independent. Therapy evaluations completed due to patient decreased functional mobility was admitted for a comprehensive rehab program.   Review of Systems  Constitutional: Negative.   HENT: Negative.    Eyes: Negative.   Respiratory: Negative.    Cardiovascular: Negative.   Gastrointestinal:  Negative for abdominal pain, constipation and diarrhea.  Genitourinary: Negative.   Musculoskeletal:  Negative for joint pain and myalgias.  Skin: Negative.   Neurological: Negative.   Psychiatric/Behavioral: Negative.          Past Medical History:  Diagnosis Date   Type II diabetes mellitus (HCC) dx'd 10/08/2016   Past Surgical History:  Procedure Laterality Date   AMPUTATION Right 10/09/2016   Procedure: INCISION AND  DRAINAGE RIGHT GREAT TOE;  Surgeon: Timothy Ford, MD;  Location: [facility] OR;  Service: Orthopedics;  Laterality: Right;   AMPUTATION Left 07/23/2023   Procedure: AMPUTATION, BELOW KNEE, LEFT;  Surgeon: Timothy Ford, MD;  Location: [facility] OR;  Service: Orthopedics;  Laterality: Left;  IRRIGATION AND DEBRIDEMENT AND AMPUTATION OF LEFT LEG   IRRIGATION AND DEBRIDEMENT KNEE Left 07/25/2023   Procedure: IRRIGATION AND DEBRIDEMENT LEFT KNEE;  Surgeon: Timothy Ford, MD;  Location: [facility] OR;  Service: Orthopedics;  Laterality: Left;  DEBRIDEMENT LEFT LEG   Family History  Problem Relation Age of Onset   Diabetes Father    Lung cancer Other    Stroke Other    CAD Neg Hx    Hypertension Neg Hx    Social History:  reports that he has never smoked. He has never used smokeless tobacco. He reports that he does not drink alcohol and does not use drugs.   Allergies:  Allergies  Allergen Reactions   Metformin  And Related Other (See Comments)    Severe diarrhea   Medications Prior to Admission  Medication Sig Dispense Refill   diclofenac Sodium (VOLTAREN) 1 % GEL Apply 4 g topically as needed (Bilateral leg and hand pain).     naproxen sodium (ALEVE) 220 MG tablet Take 440 mg by mouth as needed.     rosuvastatin  (CRESTOR ) 20 MG tablet Take 1 tablet (20 mg total) by mouth daily. (Patient taking differently: Take 20 mg by mouth at bedtime.) 90 tablet 2   tirzepatide  (MOUNJARO ) 15 MG/0.5ML Pen Inject 15 mg into the skin once a week. 2 mL  5   valsartan  (DIOVAN ) 40 MG tablet Take 1 tablet (40 mg total) by mouth daily. (Patient taking differently: Take 40 mg by mouth at bedtime.) 90 tablet 3     Home: Home Living Family/patient expects to be discharged to:: Private residence Living Arrangements: Spouse/significant other, Children Available Help at Discharge: Family Type of Home: House Home Access: Ramped entrance Home Layout: One level Bathroom Shower/Tub: Engineer, manufacturing systems:  Standard Bathroom Accessibility: Yes Home Equipment: Wheelchair - manual, Rollator (4 wheels)  Lives With: Spouse   Functional History: Prior Function Prior Level of Function : Independent/Modified Independent Mobility Comments: prior to admission, pt help caring for son who suffered from medical issues last year and is currently hoyer-lift level. pt endorses being completely independent ADLs Comments: indep  Functional Status:  Mobility: Bed Mobility Overal bed mobility: Needs Assistance Bed Mobility: Supine to Sit Supine to sit: Supervision, HOB elevated, Used rails Sit to supine: Supervision, Used rails General bed mobility comments: Supervision for safety, pt able to manage transitioning supine <> sit L EOB with heavy use of rails and with HOB elevated Transfers Overall transfer level: Needs assistance Equipment used: Rolling walker (2 wheels) Transfers: Sit to/from Stand Sit to Stand: Min assist, +2 safety/equipment Bed to/from chair/wheelchair/BSC transfer type:: Step pivot Stand pivot transfers: Min assist, Mod assist, +2 physical assistance, +2 safety/equipment Step pivot transfers: Min assist, +2 physical assistance  Lateral/Scoot Transfers: Min Banker via Lift Equipment: [employer] transfer comment: min assist to power up and steady from EOB and recliner, cueing for hand placement from EOB but able to recall from recliner. increased assist to step to recliner for RW mgmt Ambulation/Gait Ambulation/Gait assistance: Mod assist, +2 physical assistance, +2 safety/equipment Gait Distance (Feet): 5 Feet Assistive device: Rolling walker (2 wheels) Gait Pattern/deviations: Leaning posteriorly, Knees buckling (hop-to; R knee buckle) General Gait Details: Pt declined due to increased pain and fatigue Gait velocity: reduced Gait velocity interpretation: <1.31 ft/sec, indicative of household ambulator    ADL: ADL Overall ADL's : Needs  assistance/impaired Eating/Feeding: Independent Grooming: Set up, Sitting Upper Body Bathing: Set up, Sitting Lower Body Bathing: Moderate assistance, Sitting/lateral leans, Sit to/from stand Upper Body Dressing : Set up, Sitting Lower Body Dressing: Moderate assistance, Sitting/lateral leans Lower Body Dressing Details (indicate cue type and reason): pt able to doff R sock, min assist to don.  lateral leans with some assist for simulated clothing mgt Toilet Transfer: Minimal assistance, +2 for safety/equipment, Rolling walker (2 wheels) Toileting- Clothing Manipulation and Hygiene: Maximal assistance, Sit to/from stand, Sitting/lateral lean Functional mobility during ADLs: Minimal assistance, +2 for safety/equipment  Cognition: Cognition Orientation Level: Oriented X4 Cognition Arousal: Alert Behavior During Therapy: WFL for tasks assessed/performed  Physical Exam: Blood pressure 139/68, pulse 97, temperature 98.1 F (36.7 C), temperature source Oral, resp. rate 18, height 6' (1.829 m), weight 84.8 kg, SpO2 99%.   Physical Exam Vitals reviewed.  Constitutional:      General: He is not in acute distress.    Appearance: Normal appearance. He is not ill-appearing.  HENT:     Head: Normocephalic and atraumatic.     Nose: Nose normal.     Mouth/Throat:     Mouth: Mucous membranes are dry.     Pharynx: Oropharynx is clear.  Eyes:     Extraocular Movements: Extraocular movements intact.     Pupils: Pupils are equal, round, and reactive to light.  Cardiovascular:     Rate and Rhythm: Normal rate and regular rhythm.  Pulses: Normal pulses.     Heart sounds: Murmur heard.  Pulmonary:     Effort: Pulmonary effort is normal. No respiratory distress.     Breath sounds: Normal breath sounds. No wheezing or rhonchi.  Abdominal:     General: Abdomen is protuberant. Bowel sounds are normal. There is no distension.     Palpations: Abdomen is soft.     Tenderness: There is no  abdominal tenderness. There is no guarding or rebound.     Comments: LBM: 5/28 soft/normal   Musculoskeletal:        General: Normal range of motion.     Cervical back: Normal range of motion.     Right lower leg: Normal. No swelling. No edema.     Comments: Left BKA-in Vac w/ shrinker.  Skin:    General: Skin is warm and dry.     Capillary Refill: Capillary refill takes less than 2 seconds.  Neurological:     General: No focal deficit present.     Mental Status: He is alert and oriented to person, place, and time. Mental status is at baseline.     Sensory: Sensation is intact.     Motor: Motor function is intact.  Psychiatric:        Mood and Affect: Mood normal.        Behavior: Behavior normal.        Thought Content: Thought content normal.        Judgment: Judgment normal.       Results for orders placed or performed during the hospital encounter of 07/22/23 (from the past 48 hours)  Glucose, capillary     Status: Abnormal   Collection Time: 07/28/23 12:09 PM  Result Value Ref Range   Glucose-Capillary 121 (H) 70 - 99 mg/dL    Comment: Glucose reference range applies only to samples taken after fasting for at least 8 hours.  Glucose, capillary     Status: Abnormal   Collection Time: 07/28/23  4:18 PM  Result Value Ref Range   Glucose-Capillary 192 (H) 70 - 99 mg/dL    Comment: Glucose reference range applies only to samples taken after fasting for at least 8 hours.  Glucose, capillary     Status: Abnormal   Collection Time: 07/28/23  8:38 PM  Result Value Ref Range   Glucose-Capillary 138 (H) 70 - 99 mg/dL    Comment: Glucose reference range applies only to samples taken after fasting for at least 8 hours.  CBC     Status: Abnormal   Collection Time: 07/29/23  4:44 AM  Result Value Ref Range   WBC 10.5 4.0 - 10.5 K/uL   RBC 2.50 (L) 4.22 - 5.81 MIL/uL   Hemoglobin 7.3 (L) 13.0 - 17.0 g/dL   HCT 29.5 (L) 62.1 - 30.8 %   MCV 88.8 80.0 - 100.0 fL   MCH 29.2 26.0 -  34.0 pg   MCHC 32.9 30.0 - 36.0 g/dL   RDW 65.7 84.6 - 96.2 %   Platelets 418 (H) 150 - 400 K/uL   nRBC 0.0 0.0 - 0.2 %    Comment: Performed at [facility], [address], [place], [place] [address]  Magnesium      Status: None   Collection Time: 07/29/23  4:44 AM  Result Value Ref Range   Magnesium  1.8 1.7 - 2.4 mg/dL    Comment: Performed at [facility], [address], [place], [place] [address]  Basic metabolic panel  with GFR     Status: Abnormal   Collection Time: 07/29/23  4:44 AM  Result Value Ref Range   Sodium 134 (L) 135 - 145 mmol/L   Potassium 4.7 3.5 - 5.1 mmol/L   Chloride 99 98 - 111 mmol/L   CO2 30 22 - 32 mmol/L   Glucose, Bld 131 (H) 70 - 99 mg/dL    Comment: Glucose reference range applies only to samples taken after fasting for at least 8 hours.   BUN 40 (H) 6 - 20 mg/dL   Creatinine, Ser 1.61 (H) 0.61 - 1.24 mg/dL   Calcium  8.0 (L) 8.9 - 10.3 mg/dL   GFR, Estimated 50 (L) >60 mL/min    Comment: (NOTE) Calculated using the CKD-EPI Creatinine Equation (2021)    Anion gap 5 5 - 15    Comment: Performed at [facility], [address], [place], [place] [address]  Glucose, capillary     Status: Abnormal   Collection Time: 07/29/23  8:01 AM  Result Value Ref Range   Glucose-Capillary 145 (H) 70 - 99 mg/dL    Comment: Glucose reference range applies only to samples taken after fasting for at least 8 hours.  Glucose, capillary     Status: Abnormal   Collection Time: 07/29/23 12:34 PM  Result Value Ref Range   Glucose-Capillary 150 (H) 70 - 99 mg/dL    Comment: Glucose reference range applies only to samples taken after fasting for at least 8 hours.  Glucose, capillary     Status: Abnormal   Collection Time: 07/29/23  5:16 PM  Result Value Ref Range   Glucose-Capillary 160 (H) 70 - 99 mg/dL    Comment: Glucose reference range applies only to samples taken after fasting for at least 8 hours.  CBC     Status: Abnormal   Collection Time:  07/30/23  5:43 AM  Result Value Ref Range   WBC 11.4 (H) 4.0 - 10.5 K/uL   RBC 2.43 (L) 4.22 - 5.81 MIL/uL   Hemoglobin 7.1 (L) 13.0 - 17.0 g/dL   HCT 54.0 (L) 98.1 - 19.1 %   MCV 89.7 80.0 - 100.0 fL   MCH 29.2 26.0 - 34.0 pg   MCHC 32.6 30.0 - 36.0 g/dL   RDW 47.8 29.5 - 62.1 %   Platelets 442 (H) 150 - 400 K/uL   nRBC 0.0 0.0 - 0.2 %    Comment: Performed at [facility], [address], [place], [place] [address]  Magnesium      Status: None   Collection Time: 07/30/23  5:43 AM  Result Value Ref Range   Magnesium  2.0 1.7 - 2.4 mg/dL    Comment: Performed at [facility], [address], [place], [place] [address]  Basic metabolic panel with GFR     Status: Abnormal   Collection Time: 07/30/23  5:43 AM  Result Value Ref Range   Sodium 133 (L) 135 - 145 mmol/L   Potassium 4.5 3.5 - 5.1 mmol/L   Chloride 102 98 - 111 mmol/L   CO2 27 22 - 32 mmol/L   Glucose, Bld 145 (H) 70 - 99 mg/dL    Comment: Glucose reference range applies only to samples taken after fasting for at least 8 hours.   BUN 47 (H) 6 - 20 mg/dL   Creatinine, Ser 6.29 (H) 0.61 - 1.24 mg/dL   Calcium  7.9 (L) 8.9 - 10.3 mg/dL   GFR, Estimated >52 >84 mL/min  Comment: (NOTE) Calculated using the CKD-EPI Creatinine Equation (2021)    Anion gap 4 (L) 5 - 15    Comment: Performed at [facility], [address], [place], [place] [address]  Phosphorus     Status: None   Collection Time: 07/30/23  5:43 AM  Result Value Ref Range   Phosphorus 3.6 2.5 - 4.6 mg/dL    Comment: Performed at [facility], [address], [place], [place] [address]  Glucose, capillary     Status: Abnormal   Collection Time: 07/30/23  7:32 AM  Result Value Ref Range   Glucose-Capillary 152 (H) 70 - 99 mg/dL    Comment: Glucose reference range applies only to samples taken after fasting for at least 8 hours.     Blood pressure 139/68, pulse 97, temperature 98.1 F (36.7 C), temperature source Oral, resp. rate  18, height 6' (1.829 m), weight 84.8 kg, SpO2 99%.  Medical Problem List and Plan: 1. 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  -patient may not shower  -ELOS/Goals: 12-14 days   2.  Antithrombotics: -DVT/anticoagulation:  Pharmaceutical: Lovenox  -antiplatelet therapy:  3. Pain Management: Continue oxycodone  5-10 mg PRN.   -monitor for breakthrough  -tylenol  PRN  4. Mood/Behavior/Sleep: LCSW to follow for evaluation and support.  -antipsychotic agents: N/A  -order for Trazadone prn   5. Neuropsych/cognition: This patient is capable of making decisions on his own behalf. 6. Skin/Wound Care: Maintain  wound VAC for 14 days (08/08/2023).   - Continue zinc and vitamin C  -Prevalon Boot ordered  7. Fluids/Electrolytes/Nutrition: monitor I/O, check CMET   -encourage fluids(AKI)   -Continue Juven supplements/ Diet: Carb modified  8. TDM2: Hemoglobin A1c 6.7 (stable) - Monitor B/S use SSI for elevated blood sugar--current BS rang 140-160s -Continue Semglee 5 units  -Continue Mounjaro  15 mg subcutaneous injection Friday (nonformulary requested/wife to bring from home). 9. HLD: Crestor  20 mg daily/pm 10.  HTN: Monitor BP's TID -Valsartan  being held (RX note) 11. Anemia: hx of anemia/ monitor CBC.    Nasario Badder, NP 07/30/2023

## 2023-07-30 NOTE — PMR Pre-admission (Signed)
 PMR Admission Coordinator Pre-Admission Assessment  Patient: Scott Navarro. is an 58 y.o., male MRN: [id number] DOB: 05/14/1965 Height: 6' (182.9 cm) Weight: 84.8 kg  Insurance Information HMO:   yes  PPO:      PCP:      IPA:      80/20:      OTHER:  PRIMARY: [employer] other      Policy#: [id number]      Subscriber: pt CM Name: Scott Navarro       Phone#: [phone] [id number]     Fax#:  [phone] Received authorization from Scott Navarro at [facility] with on 5/27 for admission 3/47/42-07/11/54 Pre-Cert#: [id number]       Employer:  Benefits:  Phone #:      Name:  Scott Navarro Date: 03/05/2023- 03/03/2024 Deductible: $500 ($500 met) OOP Max: $5,000 ($902.82 met) CIR: 80% coverage, 20% co-insurance SNF: 100% coverage, Limited to 90 days combined with Habilitative Inpatient Services Outpatient:  $10 copay/visit Limited to 90 combined visits/cal yr Home Health: 100% coverage DME: 80% coverage, 20% co-insurance Providers: in network  SECONDARY:       Policy#:      Phone#:    The Engineer, materials Information Summary" for patients in Inpatient Rehabilitation Facilities with attached "Privacy Act Statement-Health Care Records" was provided and verbally reviewed with: N/A  Emergency Contact Information Contact Information     Name Relation Home Work Mobile   Scott Navarro   [phone]      Other Contacts     Name Relation Home Work Mobile   Scott Navarro   [phone]      Current Medical History  Patient Admitting Diagnosis: BKA  History of Present Illness: Scott Navarro. is a 58 y.o. R handed male  with hx of DM- recent A1c 6.7, but was completely uncontrolled prior to 6-12 months ago; HTN, HLD and weakness of Arms and legs for <1 year unknown cause; Patient admitted to [facility] on 07/22/23 with L foot 1 week hx of  worsening wounds on LLE- when admitted, found to have gas and necrotizing fasciitis of LLE- pt went to OR 5/21 for Amputation but was left open with VAC  placed- on 5/23, was taken back to OR for I&D and closure and VAC replacement.    Glasgow Coma Scale Score: 15  Patient's medical record from [facility] has been reviewed by the rehabilitation admission coordinator and physician.  Past Medical History  Past Medical History:  Diagnosis Date   Type II diabetes mellitus (HCC) dx'd 10/08/2016    Has the patient had major surgery during 100 days prior to admission? Yes  Family History  family history includes Diabetes in his father; Lung cancer in an other family member; Stroke in an other family member.   Current Medications   Current Facility-Administered Medications:    0.9 %  sodium chloride  infusion, , Intravenous, Continuous, Scott Ford, MD, Last Rate: 10 mL/hr at 07/23/23 1905, New Bag at 07/23/23 1905   0.9 %  sodium chloride  infusion, , Intravenous, Continuous, Scott Ford, MD, Last Rate: 10 mL/hr at 07/25/23 1425, New Bag at 07/25/23 1425   acetaminophen  (TYLENOL ) tablet 325-650 mg, 325-650 mg, Oral, Q6H PRN, Scott Ford, MD   ascorbic acid (VITAMIN C) tablet 1,000 mg, 1,000 mg, Oral, Daily, Duda, Marcus V, MD, 1,000 mg at 07/29/23 0825   dextrose  50 % solution 0-50 mL, 0-50 mL, Intravenous, PRN, Duda, Marcus V, MD   heparin injection  5,000 Units, 5,000 Units, Subcutaneous, Q8H, Scott Ford, MD, 5,000 Units at 07/30/23 0601   HYDROmorphone  (DILAUDID ) injection 0.5-1 mg, 0.5-1 mg, Intravenous, Q4H PRN, Duda, Marcus V, MD   insulin  aspart (novoLOG ) injection 0-15 Units, 0-15 Units, Subcutaneous, TID WC, Khatri, Pardeep, MD, 3 Units at 07/30/23 0834   insulin  glargine-yfgn (SEMGLEE) injection 5 Units, 5 Units, Subcutaneous, Q24H, Duda, Marcus V, MD, 5 Units at 07/29/23 1540   magnesium  sulfate IVPB 2 g 50 mL, 2 g, Intravenous, Daily PRN, Duda, Marcus V, MD   nutrition supplement (JUVEN) (JUVEN) powder packet 1 packet, 1 packet, Oral, BID BM, Scott Ford, MD, 1 packet at 07/29/23 1540   oxyCODONE  (Oxy  IR/ROXICODONE ) immediate release tablet 10-15 mg, 10-15 mg, Oral, Q4H PRN, Duda, Marcus V, MD, 10 mg at 07/28/23 1955   oxyCODONE  (Oxy IR/ROXICODONE ) immediate release tablet 5-10 mg, 5-10 mg, Oral, Q4H PRN, Duda, Marcus V, MD, 10 mg at 07/29/23 1538   potassium chloride SA (KLOR-CON M) CR tablet 20-40 mEq, 20-40 mEq, Oral, Daily PRN, Duda, Marcus V, MD   zinc sulfate (50mg  elemental zinc) capsule 220 mg, 220 mg, Oral, Daily, Duda, Marcus V, MD, 220 mg at 07/29/23 0825  Patients Current Diet:  Diet Order             Diet Carb Modified Fluid consistency: Thin; Room service appropriate? Yes  Diet effective now                   Precautions / Restrictions Precautions Precautions: Fall Precaution/Restrictions Comments: L LE wound vac, L BKA; watch BP Restrictions Weight Bearing Restrictions Per Provider Order: Yes LLE Weight Bearing Per Provider Order: Weight bearing as tolerated Other Position/Activity Restrictions: 5/26 per surgeon Scott Navarro), ampushield is out of stock currently   Has the patient had 2 or more falls or a fall with injury in the past year?Yes  Prior Activity Level Community (5-7x/wk): Pt. active in the community PTA  Prior Functional Level Prior Function Prior Level of Function : Independent/Modified Independent Mobility Comments: prior to admission, pt help caring for Navarro who suffered from medical issues last year and is currently hoyer-lift level. pt endorses being completely independent ADLs Comments: indep  Self Care: Did the patient need help bathing, dressing, using the toilet or eating?  Independent  Indoor Mobility: Did the patient need assistance with walking from room to room (with or without device)? Independent  Stairs: Did the patient need assistance with internal or external stairs (with or without device)? Independent  Functional Cognition: Did the patient need help planning regular tasks such as shopping or remembering to take medications?  Independent  Patient Information Are you of Hispanic, Latino/a,or Spanish origin?: A. No, not of Hispanic, Latino/a, or Spanish origin What is your race?: A. White Do you need or want an interpreter to communicate with a doctor or health care staff?: 0. No  Patient's Response To:  Health Literacy and Transportation Is the patient able to respond to health literacy and transportation needs?: Yes Health Literacy - How often do you need to have someone help you when you read instructions, pamphlets, or other written material from your doctor or pharmacy?: Never In the past 12 months, has lack of transportation kept you from medical appointments or from getting medications?: No In the past 12 months, has lack of transportation kept you from meetings, work, or from getting things needed for daily living?: No  Journalist, newspaper / Equipment Home Equipment: Wheelchair - manual, Rollator (  4 wheels)  Prior Device Use: Indicate devices/aids used by the patient prior to current illness, exacerbation or injury? None of the above  Current Functional Level Cognition  Orientation Level: Oriented X4    Extremity Assessment (includes Sensation/Coordination)  Upper Extremity Assessment: Generalized weakness (noted hand weakness bilaterally with thenar eminence atrophy)  Lower Extremity Assessment: Defer to PT evaluation LLE Deficits / Details: able to perform quad set, hip extension into pillow in supine, knee flexion to 90 deg, SLR without quad lag    ADLs  Overall ADL's : Needs assistance/impaired Eating/Feeding: Independent Grooming: Set up, Sitting Upper Body Bathing: Set up, Sitting Lower Body Bathing: Moderate assistance, Sitting/lateral leans, Sit to/from stand Upper Body Dressing : Set up, Sitting Lower Body Dressing: Moderate assistance, Sitting/lateral leans Lower Body Dressing Details (indicate cue type and reason): pt able to doff R sock, min assist to don.  lateral leans with some  assist for simulated clothing mgt Toilet Transfer: Minimal assistance, +2 for safety/equipment, Rolling walker (2 wheels) Toileting- Clothing Manipulation and Hygiene: Maximal assistance, Sit to/from stand, Sitting/lateral lean Functional mobility during ADLs: Minimal assistance, +2 for safety/equipment    Mobility  Overal bed mobility: Needs Assistance Bed Mobility: Supine to Sit Supine to sit: Supervision, HOB elevated, Used rails Sit to supine: Supervision, Used rails General bed mobility comments: Supervision for safety, pt able to manage transitioning supine <> sit L EOB with heavy use of rails and with HOB elevated    Transfers  Overall transfer level: Needs assistance Equipment used: Rolling walker (2 wheels) Transfers: Sit to/from Stand Sit to Stand: Min assist, +2 safety/equipment Bed to/from chair/wheelchair/BSC transfer type:: Step pivot Stand pivot transfers: Min assist, Mod assist, +2 physical assistance, +2 safety/equipment Step pivot transfers: Min assist, +2 physical assistance  Lateral/Scoot Transfers: Min Banker via Lift Equipment: [employer] transfer comment: min assist to power up and steady from EOB and recliner, cueing for hand placement from EOB but able to recall from recliner. increased assist to step to recliner for RW mgmt    Ambulation / Gait / Stairs / Wheelchair Mobility  Ambulation/Gait Ambulation/Gait assistance: Mod assist, +2 physical assistance, +2 safety/equipment Gait Distance (Feet): 5 Feet Assistive device: Rolling walker (2 wheels) Gait Pattern/deviations: Leaning posteriorly, Knees buckling (hop-to; R knee buckle) General Gait Details: Pt declined due to increased pain and fatigue Gait velocity: reduced Gait velocity interpretation: <1.31 ft/sec, indicative of household ambulator    Posture / Balance Dynamic Sitting Balance Sitting balance - Comments: can sit EOB unsupported, initially posterior bias but  pt-corrected Balance Overall balance assessment: Needs assistance Sitting-balance support: No upper extremity supported, Feet supported Sitting balance-Leahy Scale: Fair Sitting balance - Comments: can sit EOB unsupported, initially posterior bias but pt-corrected Standing balance support: Bilateral upper extremity supported, During functional activity, Reliant on assistive device for balance Standing balance-Leahy Scale: Poor Standing balance comment: RW and +2    Special needs/care consideration Wound Vac yes and Skin surgical incision      Previous Home Environment (from acute therapy documentation) Living Arrangements: Navarro/significant other, Children  Lives With: Navarro Available Help at Discharge: Family Type of Home: House Home Layout: One level Home Access: Ramped entrance Bathroom Shower/Tub: Engineer, manufacturing systems: Standard Bathroom Accessibility: Yes Home Care Services: No  Discharge Living Setting Plans for Discharge Living Setting: Patient's home Type of Home at Discharge: House Discharge Home Layout: One level, Other (Comment) Discharge Home Access: Ramped entrance Discharge Bathroom Shower/Tub: Tub/shower unit Discharge Bathroom Toilet: Standard Discharge Bathroom Accessibility:  Yes How Accessible: Accessible via walker Does the patient have any problems obtaining your medications?: No  Social/Family/Support Systems Patient Roles: Navarro Contact Information: Huey Scalia Anticipated Caregiver: [phone] Anticipated Caregiver's Contact Information: Pt.'s wife is primary caregiver, but also assists her Navarro and Pt.'s mother. Pt.'s MIL and daughter also assist Ability/Limitations of Caregiver: Min A Caregiver Availability: 24/7 Discharge Plan Discussed with Primary Caregiver: Yes Is Caregiver In Agreement with Plan?: Yes Does Caregiver/Family have Issues with Lodging/Transportation while Pt is in Rehab?: Yes   Goals Patient/Family Goal for  Rehab: PT/OT Supervision Expected length of stay: 12-14 days Pt/Family Agrees to Admission and willing to participate: Yes Program Orientation Provided & Reviewed with Pt/Caregiver Including Roles  & Responsibilities: Yes   Decrease burden of Care through IP rehab admission: not anticipated   Possible need for SNF placement upon discharge:not anticipated   Patient Condition: This patient's condition remains as documented in the consult dated 07/28/23, in which the Rehabilitation Physician determined and documented that the patient's condition is appropriate for intensive rehabilitative care in an inpatient rehabilitation facility. Will admit to inpatient rehab today.  Preadmission Screen Completed By:  Dorena Gander, CCC-SLP, 07/30/2023 8:37 AM ______________________________________________________________________   Discussed status with Dr. Rayleen Cal on 07/30/23 at 900 and received approval for admission today.  Admission Coordinator:  Dorena Gander, time 07/30/23 Alanna Hu [phone]

## 2023-07-30 NOTE — Progress Notes (Signed)
 PMR Admission Coordinator Pre-Admission Assessment   Patient: Scott Navarro. is an 57 y.o., male MRN: [id number] DOB: 01/22/1966 Height: 6' (182.9 cm) Weight: 84.8 kg   Insurance Information HMO:   yes  PPO:      PCP:      IPA:      80/20:      OTHER:  PRIMARY: [employer] other      Policy#: [id number]      Subscriber: pt CM Name: Kingsley Penny       Phone#: [phone]     Fax#:  [phone] Received authorization from [place] at [facility] with on 5/27 for admission 0/27/25-05/08/62 Pre-Cert#: [id number]       Employer:  Benefits:  Phone #:      Name:  Venson Ginger Date: 03/05/2023- 03/03/2024 Deductible: $500 ($500 met) OOP Max: $5,000 ($902.82 met) CIR: 80% coverage, 20% co-insurance SNF: 100% coverage, Limited to 90 days combined with Habilitative Inpatient Services Outpatient:  $10 copay/visit Limited to 90 combined visits/cal yr Home Health: 100% coverage DME: 80% coverage, 20% co-insurance Providers: in network  SECONDARY:       Policy#:      Phone#:           The Engineer, materials Information Summary" for patients in Inpatient Rehabilitation Facilities with attached "Privacy Act Statement-Health Care Records" was provided and verbally reviewed with: N/A   Emergency Contact Information Contact Information       Name Relation Home Work Mobile    Brantley Spouse     [phone]         Other Contacts       Name Relation Home Work Mobile    [place] Son     [phone]         Current Medical History  Patient Admitting Diagnosis: BKA   History of Present Illness: Scott Navarro. is a 58 y.o. R handed male  with hx of DM- recent A1c 6.7, but was completely uncontrolled prior to 6-12 months ago; HTN, HLD and weakness of Arms and legs for <1 year unknown cause; Patient admitted to [facility] on 07/22/23 with L foot 1 week hx of  worsening wounds on LLE- when admitted, found to have gas and necrotizing fasciitis of LLE- pt went to OR 5/21 for Amputation but  was left open with VAC placed- on 5/23, was taken back to OR for I&D and closure and VAC replacement.  [place] Coma Scale Score: 15   Patient's medical record from [facility] has been reviewed by the rehabilitation admission coordinator and physician.   Past Medical History      Past Medical History:  Diagnosis Date   Type II diabetes mellitus (HCC) dx'd 10/08/2016          Has the patient had major surgery during 100 days prior to admission? Yes   Family History  family history includes Diabetes in his father; Lung cancer in an other family member; Stroke in an other family member.     Current Medications   Current Medications    Current Facility-Administered Medications:    0.9 %  sodium chloride  infusion, , Intravenous, Continuous, Timothy Ford, MD, Last Rate: 10 mL/hr at 07/23/23 1905, New Bag at 07/23/23 1905   0.9 %  sodium chloride  infusion, , Intravenous, Continuous, Timothy Ford, MD, Last Rate: 10 mL/hr at 07/25/23 1425, New Bag at 07/25/23 1425   acetaminophen  (TYLENOL ) tablet 325-650 mg, 325-650 mg, Oral, Q6H PRN, Duda,  Marshia Skene, MD   ascorbic acid (VITAMIN C) tablet 1,000 mg, 1,000 mg, Oral, Daily, Duda, Marcus V, MD, 1,000 mg at 07/29/23 0825   dextrose  50 % solution 0-50 mL, 0-50 mL, Intravenous, PRN, Duda, Marcus V, MD   heparin injection 5,000 Units, 5,000 Units, Subcutaneous, Q8H, Duda, Marcus V, MD, 5,000 Units at 07/30/23 0601   HYDROmorphone  (DILAUDID ) injection 0.5-1 mg, 0.5-1 mg, Intravenous, Q4H PRN, Duda, Marcus V, MD   insulin  aspart (novoLOG ) injection 0-15 Units, 0-15 Units, Subcutaneous, TID WC, Khatri, Pardeep, MD, 3 Units at 07/30/23 0834   insulin  glargine-yfgn (SEMGLEE) injection 5 Units, 5 Units, Subcutaneous, Q24H, Duda, Marcus V, MD, 5 Units at 07/29/23 1540   magnesium  sulfate IVPB 2 g 50 mL, 2 g, Intravenous, Daily PRN, Duda, Marcus V, MD   nutrition supplement (JUVEN) (JUVEN) powder packet 1 packet, 1 packet, Oral, BID BM,  Timothy Ford, MD, 1 packet at 07/29/23 1540   oxyCODONE  (Oxy IR/ROXICODONE ) immediate release tablet 10-15 mg, 10-15 mg, Oral, Q4H PRN, Duda, Marcus V, MD, 10 mg at 07/28/23 1955   oxyCODONE  (Oxy IR/ROXICODONE ) immediate release tablet 5-10 mg, 5-10 mg, Oral, Q4H PRN, Duda, Marcus V, MD, 10 mg at 07/29/23 1538   potassium chloride SA (KLOR-CON M) CR tablet 20-40 mEq, 20-40 mEq, Oral, Daily PRN, Duda, Marcus V, MD   zinc sulfate (50mg  elemental zinc) capsule 220 mg, 220 mg, Oral, Daily, Duda, Marcus V, MD, 220 mg at 07/29/23 0825     Patients Current Diet:  Diet Order                  Diet Carb Modified Fluid consistency: Thin; Room service appropriate? Yes  Diet effective now                         Precautions / Restrictions Precautions Precautions: Fall Precaution/Restrictions Comments: L LE wound vac, L BKA; watch BP Restrictions Weight Bearing Restrictions Per Provider Order: Yes LLE Weight Bearing Per Provider Order: Weight bearing as tolerated Other Position/Activity Restrictions: 5/26 per surgeon Julio Ohm), ampushield is out of stock currently    Has the patient had 2 or more falls or a fall with injury in the past year?Yes   Prior Activity Level Community (5-7x/wk): Pt. active in the community PTA   Prior Functional Level Prior Function Prior Level of Function : Independent/Modified Independent Mobility Comments: prior to admission, pt help caring for son who suffered from medical issues last year and is currently hoyer-lift level. pt endorses being completely independent ADLs Comments: indep   Self Care: Did the patient need help bathing, dressing, using the toilet or eating?  Independent   Indoor Mobility: Did the patient need assistance with walking from room to room (with or without device)? Independent   Stairs: Did the patient need assistance with internal or external stairs (with or without device)? Independent   Functional Cognition: Did the patient need  help planning regular tasks such as shopping or remembering to take medications? Independent   Patient Information Are you of Hispanic, Latino/a,or Spanish origin?: A. No, not of Hispanic, Latino/a, or Spanish origin What is your race?: A. White Do you need or want an interpreter to communicate with a doctor or health care staff?: 0. No   Patient's Response To:  Health Literacy and Transportation Is the patient able to respond to health literacy and transportation needs?: Yes Health Literacy - How often do you need to have someone help you when  you read instructions, pamphlets, or other written material from your doctor or pharmacy?: Never In the past 12 months, has lack of transportation kept you from medical appointments or from getting medications?: No In the past 12 months, has lack of transportation kept you from meetings, work, or from getting things needed for daily living?: No   Journalist, newspaper / Equipment Home Equipment: Wheelchair - manual, Rollator (4 wheels)   Prior Device Use: Indicate devices/aids used by the patient prior to current illness, exacerbation or injury? None of the above   Current Functional Level Cognition   Orientation Level: Oriented X4    Extremity Assessment (includes Sensation/Coordination)   Upper Extremity Assessment: Generalized weakness (noted hand weakness bilaterally with thenar eminence atrophy)  Lower Extremity Assessment: Defer to PT evaluation LLE Deficits / Details: able to perform quad set, hip extension into pillow in supine, knee flexion to 90 deg, SLR without quad lag     ADLs   Overall ADL's : Needs assistance/impaired Eating/Feeding: Independent Grooming: Set up, Sitting Upper Body Bathing: Set up, Sitting Lower Body Bathing: Moderate assistance, Sitting/lateral leans, Sit to/from stand Upper Body Dressing : Set up, Sitting Lower Body Dressing: Moderate assistance, Sitting/lateral leans Lower Body Dressing Details (indicate  cue type and reason): pt able to doff R sock, min assist to don.  lateral leans with some assist for simulated clothing mgt Toilet Transfer: Minimal assistance, +2 for safety/equipment, Rolling walker (2 wheels) Toileting- Clothing Manipulation and Hygiene: Maximal assistance, Sit to/from stand, Sitting/lateral lean Functional mobility during ADLs: Minimal assistance, +2 for safety/equipment     Mobility   Overal bed mobility: Needs Assistance Bed Mobility: Supine to Sit Supine to sit: Supervision, HOB elevated, Used rails Sit to supine: Supervision, Used rails General bed mobility comments: Supervision for safety, pt able to manage transitioning supine <> sit L EOB with heavy use of rails and with HOB elevated     Transfers   Overall transfer level: Needs assistance Equipment used: Rolling walker (2 wheels) Transfers: Sit to/from Stand Sit to Stand: Min assist, +2 safety/equipment Bed to/from chair/wheelchair/BSC transfer type:: Step pivot Stand pivot transfers: Min assist, Mod assist, +2 physical assistance, +2 safety/equipment Step pivot transfers: Min assist, +2 physical assistance  Lateral/Scoot Transfers: Min Banker via Lift Equipment: [employer] transfer comment: min assist to power up and steady from EOB and recliner, cueing for hand placement from EOB but able to recall from recliner. increased assist to step to recliner for RW mgmt     Ambulation / Gait / Stairs / Wheelchair Mobility   Ambulation/Gait Ambulation/Gait assistance: Mod assist, +2 physical assistance, +2 safety/equipment Gait Distance (Feet): 5 Feet Assistive device: Rolling walker (2 wheels) Gait Pattern/deviations: Leaning posteriorly, Knees buckling (hop-to; R knee buckle) General Gait Details: Pt declined due to increased pain and fatigue Gait velocity: reduced Gait velocity interpretation: <1.31 ft/sec, indicative of household ambulator     Posture / Balance Dynamic Sitting Balance Sitting  balance - Comments: can sit EOB unsupported, initially posterior bias but pt-corrected Balance Overall balance assessment: Needs assistance Sitting-balance support: No upper extremity supported, Feet supported Sitting balance-Leahy Scale: Fair Sitting balance - Comments: can sit EOB unsupported, initially posterior bias but pt-corrected Standing balance support: Bilateral upper extremity supported, During functional activity, Reliant on assistive device for balance Standing balance-Leahy Scale: Poor Standing balance comment: RW and +2     Special needs/care consideration Wound Vac yes and Skin surgical incision         Previous Home  Environment (from acute therapy documentation) Living Arrangements: Spouse/significant other, Children  Lives With: Spouse Available Help at Discharge: Family Type of Home: House Home Layout: One level Home Access: Ramped entrance Bathroom Shower/Tub: Associate Professor: Yes Home Care Services: No   Discharge Living Setting Plans for Discharge Living Setting: Patient's home Type of Home at Discharge: House Discharge Home Layout: One level, Other (Comment) Discharge Home Access: Ramped entrance Discharge Bathroom Shower/Tub: Tub/shower unit Discharge Bathroom Toilet: Standard Discharge Bathroom Accessibility: Yes How Accessible: Accessible via walker Does the patient have any problems obtaining your medications?: No   Social/Family/Support Systems Patient Roles: Spouse Contact Information: Devlin Brink Anticipated Caregiver: [phone] Anticipated Caregiver's Contact Information: Pt.'s wife is primary caregiver, but also assists her son and Pt.'s mother. Pt.'s MIL and daughter also assist Ability/Limitations of Caregiver: Min A Caregiver Availability: 24/7 Discharge Plan Discussed with Primary Caregiver: Yes Is Caregiver In Agreement with Plan?: Yes Does Caregiver/Family have Issues with  Lodging/Transportation while Pt is in Rehab?: Yes     Goals Patient/Family Goal for Rehab: PT/OT Supervision Expected length of stay: 12-14 days Pt/Family Agrees to Admission and willing to participate: Yes Program Orientation Provided & Reviewed with Pt/Caregiver Including Roles  & Responsibilities: Yes     Decrease burden of Care through IP rehab admission: not anticipated     Possible need for SNF placement upon discharge:not anticipated     Patient Condition: This patient's condition remains as documented in the consult dated 07/28/23, in which the Rehabilitation Physician determined and documented that the patient's condition is appropriate for intensive rehabilitative care in an inpatient rehabilitation facility. Will admit to inpatient rehab today.   Preadmission Screen Completed By:  Dorena Gander, CCC-SLP, 07/30/2023 8:37 AM ______________________________________________________________________   Discussed status with Dr. Rayleen Cal on 07/30/23 at 900 and received approval for admission today.   Admission Coordinator:  Dorena Gander, time 07/30/23 Alanna Hu [phone]

## 2023-07-30 NOTE — H&P (Signed)
 Physical Medicine and Rehabilitation Admission H&P    Chief Complaint  Patient presents with   Functional Deficits r/t Left BKA   : HPI: Scott Navarrois a 58 year old right handed Caucasian male with significant PMHX of DM type 2 (recent A1c 6.7), Charcot arthropathy, chronic plantar ulceration, HTN, HLD and anemia.  Presented on 07/22/2023 due to 4-5 days of worsening wound on left foot along with some generalized weakness.  Patient reported small area of discoloration on the left lateral foot area rapidly progressed, now involving the entire foot. CT scan showed extensive soft tissue gas formation throughout the left leg and foot.  Admission labs showed WBC 44, hgb 11, creatinine 2.3, glucose 341, bicarb 23, lactic acid 1.3, potassium 3.2.  Chest xray unremarkable, EKG-sinus tachycardia.  On 07/23/23 Dr. Julio Ohm performed left transtibial amputation and debridement of necrotizing fasciitis; patient was left open back placed. Pt returned to OR on 07/25/2023 for I&D. Prevena wound VAC secured upon closure. POD #7.  He denies phantom pain. Pain overall controlled with current medications.  He has chronic muscle atrophy in his hands.  Social hx: Retired from [employer], lives at home with wife and son who has extensive medical needs. PTA pt walked with cane intermittently otherwise independent. Therapy evaluations completed due to patient decreased functional mobility was admitted for a comprehensive rehab program.   Review of Systems  Constitutional: Negative.   HENT: Negative.    Eyes: Negative.   Respiratory: Negative.    Cardiovascular: Negative.   Gastrointestinal:  Negative for abdominal pain, constipation and diarrhea.  Genitourinary: Negative.   Musculoskeletal:  Negative for joint pain and myalgias.  Skin: Negative.   Neurological:  Positive for dizziness and focal weakness. Negative for sensory change.       Unusual sensation in his head occurs sometimes   Psychiatric/Behavioral: Negative.          Past Medical History:  Diagnosis Date   Type II diabetes mellitus (HCC) dx'd 10/08/2016   Past Surgical History:  Procedure Laterality Date   AMPUTATION Right 10/09/2016   Procedure: INCISION AND DRAINAGE RIGHT GREAT TOE;  Surgeon: Timothy Ford, MD;  Location: [facility] OR;  Service: Orthopedics;  Laterality: Right;   AMPUTATION Left 07/23/2023   Procedure: AMPUTATION, BELOW KNEE, LEFT;  Surgeon: Timothy Ford, MD;  Location: [facility] OR;  Service: Orthopedics;  Laterality: Left;  IRRIGATION AND DEBRIDEMENT AND AMPUTATION OF LEFT LEG   IRRIGATION AND DEBRIDEMENT KNEE Left 07/25/2023   Procedure: IRRIGATION AND DEBRIDEMENT LEFT KNEE;  Surgeon: Timothy Ford, MD;  Location: [facility] OR;  Service: Orthopedics;  Laterality: Left;  DEBRIDEMENT LEFT LEG   Family History  Problem Relation Age of Onset   Diabetes Father    Lung cancer Other    Stroke Other    CAD Neg Hx    Hypertension Neg Hx    Social History:  reports that he has never smoked. He has never used smokeless tobacco. He reports that he does not drink alcohol and does not use drugs.   Allergies:  Allergies  Allergen Reactions   Metformin  And Related Diarrhea   Medications Prior to Admission  Medication Sig Dispense Refill   diclofenac Sodium (VOLTAREN) 1 % GEL Apply 4 g topically as needed (Bilateral leg and hand pain).     naproxen sodium (ALEVE) 220 MG tablet Take 440 mg by mouth as needed.     rosuvastatin  (CRESTOR ) 20 MG tablet Take 1 tablet (20 mg  total) by mouth daily. (Patient taking differently: Take 20 mg by mouth at bedtime.) 90 tablet 2   tirzepatide  (MOUNJARO ) 15 MG/0.5ML Pen Inject 15 mg into the skin once a week. 2 mL 5   valsartan  (DIOVAN ) 40 MG tablet Take 1 tablet (40 mg total) by mouth daily. (Patient taking differently: Take 40 mg by mouth at bedtime.) 90 tablet 3       Home: Home Living Family/patient expects to be discharged to:: Private residence Living  Arrangements: Spouse/significant other, Children Available Help at Discharge: Family Type of Home: House Home Access: Ramped entrance Home Layout: One level Bathroom Shower/Tub: Engineer, manufacturing systems: Standard Bathroom Accessibility: Yes Home Equipment: Wheelchair - manual, Rollator (4 wheels)  Lives With: Spouse   Functional History: Prior Function Prior Level of Function : Independent/Modified Independent Mobility Comments: prior to admission, pt help caring for son who suffered from medical issues last year and is currently hoyer-lift level. pt endorses being completely independent ADLs Comments: indep   Functional Status:  Mobility: Bed Mobility Overal bed mobility: Needs Assistance Bed Mobility: Supine to Sit Supine to sit: Supervision, HOB elevated, Used rails Sit to supine: Supervision, Used rails General bed mobility comments: Supervision for safety, pt able to manage transitioning supine <> sit L EOB with heavy use of rails and with HOB elevated Transfers Overall transfer level: Needs assistance Equipment used: Rolling walker (2 wheels) Transfers: Sit to/from Stand Sit to Stand: Min assist, +2 safety/equipment Bed to/from chair/wheelchair/BSC transfer type:: Step pivot Stand pivot transfers: Min assist, Mod assist, +2 physical assistance, +2 safety/equipment Step pivot transfers: Min assist, +2 physical assistance  Lateral/Scoot Transfers: Min Banker via Lift Equipment: [employer] transfer comment: min assist to power up and steady from EOB and recliner, cueing for hand placement from EOB but able to recall from recliner. increased assist to step to recliner for RW mgmt Ambulation/Gait Ambulation/Gait assistance: Mod assist, +2 physical assistance, +2 safety/equipment Gait Distance (Feet): 5 Feet Assistive device: Rolling walker (2 wheels) Gait Pattern/deviations: Leaning posteriorly, Knees buckling (hop-to; R knee buckle) General Gait Details:  Pt declined due to increased pain and fatigue Gait velocity: reduced Gait velocity interpretation: <1.31 ft/sec, indicative of household ambulator   ADL: ADL Overall ADL's : Needs assistance/impaired Eating/Feeding: Independent Grooming: Set up, Sitting Upper Body Bathing: Set up, Sitting Lower Body Bathing: Moderate assistance, Sitting/lateral leans, Sit to/from stand Upper Body Dressing : Set up, Sitting Lower Body Dressing: Moderate assistance, Sitting/lateral leans Lower Body Dressing Details (indicate cue type and reason): pt able to doff R sock, min assist to don.  lateral leans with some assist for simulated clothing mgt Toilet Transfer: Minimal assistance, +2 for safety/equipment, Rolling walker (2 wheels) Toileting- Clothing Manipulation and Hygiene: Maximal assistance, Sit to/from stand, Sitting/lateral lean Functional mobility during ADLs: Minimal assistance, +2 for safety/equipment   Cognition: Cognition Orientation Level: Oriented X4 Cognition Arousal: Alert Behavior During Therapy: WFL for tasks assessed/performed  Physical Exam: Blood pressure 134/68, pulse (!) 103, temperature 98.3 F (36.8 C), resp. rate 19, SpO2 100%.   Physical Exam Vitals reviewed.  Constitutional:      General: He is not in acute distress.    Appearance: Normal appearance. He is not ill-appearing.  HENT:     Head: Normocephalic and atraumatic.     Nose: Nose normal.     Mouth/Throat:     Mouth: Mucous membranes are dry.     Pharynx: Oropharynx is clear.  Eyes:     Extraocular Movements: Extraocular movements  intact.     Pupils: Pupils are equal, round, and reactive to light.  Cardiovascular:     Rate and Rhythm: Normal rate and regular rhythm.     Pulses: Normal pulses.     Heart sounds: Murmur heard.  Pulmonary:     Effort: Pulmonary effort is normal. No respiratory distress.     Breath sounds: Normal breath sounds. No wheezing or rhonchi.  Abdominal:     General: Abdomen is  protuberant. Bowel sounds are normal. There is no distension.     Palpations: Abdomen is soft.     Tenderness: There is no abdominal tenderness. There is no guarding or rebound.     Comments: LBM: 5/28 soft/normal   Musculoskeletal:        General: Normal range of motion.     Cervical back: Normal range of motion.     Right lower leg: Normal. No swelling. No edema.     Comments: Left BKA-in Vac w/ shrinker. No drainage in canister  Skin:    General: Skin is warm and dry.     Capillary Refill: Capillary refill takes less than 2 seconds.  Neurological:     Mental Status: He is alert and oriented to person, place, and time. Mental status is at baseline.     Sensory: Sensory deficit (pt reports sensation intact in b/l UE and RLE, With eyes closed he has increased dificulty with lighttouch sensation of his foot) present.     Motor: Weakness and atrophy present.     Comments:  RUE: 5/5 Deltoid, 5/5 Biceps, 5/5 Triceps, 4+/5 Wrist Ext, 4/5 Grip, FA 3/5 LUE: 5/5 Deltoid, 4+/5 Biceps,  4+/5 Triceps, 4+/5 Wrist Ext, 4/5 Grip, FA 2/5 RLE: HF 5/5, KE 5/5, KF 5/5, ADF 3/5, APF 4/5 LLE: HF 5/5, KE  at least 4/5 Atrophy in intrinsic hand muscles   Psychiatric:        Mood and Affect: Mood normal.        Behavior: Behavior normal.        Thought Content: Thought content normal.        Judgment: Judgment normal.   IV Left forearm - looks ok    Results for orders placed or performed during the hospital encounter of 07/22/23 (from the past 48 hours)  Glucose, capillary     Status: Abnormal   Collection Time: 07/28/23  8:38 PM  Result Value Ref Range   Glucose-Capillary 138 (H) 70 - 99 mg/dL    Comment: Glucose reference range applies only to samples taken after fasting for at least 8 hours.  CBC     Status: Abnormal   Collection Time: 07/29/23  4:44 AM  Result Value Ref Range   WBC 10.5 4.0 - 10.5 K/uL   RBC 2.50 (L) 4.22 - 5.81 MIL/uL   Hemoglobin 7.3 (L) 13.0 - 17.0 g/dL   HCT 40.9 (L)  81.1 - 52.0 %   MCV 88.8 80.0 - 100.0 fL   MCH 29.2 26.0 - 34.0 pg   MCHC 32.9 30.0 - 36.0 g/dL   RDW 91.4 78.2 - 95.6 %   Platelets 418 (H) 150 - 400 K/uL   nRBC 0.0 0.0 - 0.2 %    Comment: Performed at [facility], [address] [place], [place] [address]  Magnesium      Status: None   Collection Time: 07/29/23  4:44 AM  Result Value Ref Range   Magnesium  1.8 1.7 - 2.4 mg/dL    Comment: Performed at  [facility], [address], [place], [place] [address]  Basic metabolic panel with GFR     Status: Abnormal   Collection Time: 07/29/23  4:44 AM  Result Value Ref Range   Sodium 134 (L) 135 - 145 mmol/L   Potassium 4.7 3.5 - 5.1 mmol/L   Chloride 99 98 - 111 mmol/L   CO2 30 22 - 32 mmol/L   Glucose, Bld 131 (H) 70 - 99 mg/dL    Comment: Glucose reference range applies only to samples taken after fasting for at least 8 hours.   BUN 40 (H) 6 - 20 mg/dL   Creatinine, Ser 1.91 (H) 0.61 - 1.24 mg/dL   Calcium  8.0 (L) 8.9 - 10.3 mg/dL   GFR, Estimated 50 (L) >60 mL/min    Comment: (NOTE) Calculated using the CKD-EPI Creatinine Equation (2021)    Anion gap 5 5 - 15    Comment: Performed at [facility], [address] [place], [place] [address]  Glucose, capillary     Status: Abnormal   Collection Time: 07/29/23  8:01 AM  Result Value Ref Range   Glucose-Capillary 145 (H) 70 - 99 mg/dL    Comment: Glucose reference range applies only to samples taken after fasting for at least 8 hours.  Glucose, capillary     Status: Abnormal   Collection Time: 07/29/23 12:34 PM  Result Value Ref Range   Glucose-Capillary 150 (H) 70 - 99 mg/dL    Comment: Glucose reference range applies only to samples taken after fasting for at least 8 hours.  Glucose, capillary     Status: Abnormal   Collection Time: 07/29/23  5:16 PM  Result Value Ref Range   Glucose-Capillary 160 (H) 70 - 99 mg/dL    Comment: Glucose reference range applies only to samples taken after fasting for at least  8 hours.  CBC     Status: Abnormal   Collection Time: 07/30/23  5:43 AM  Result Value Ref Range   WBC 11.4 (H) 4.0 - 10.5 K/uL   RBC 2.43 (L) 4.22 - 5.81 MIL/uL   Hemoglobin 7.1 (L) 13.0 - 17.0 g/dL   HCT 56.2 (L) 13.0 - 86.5 %   MCV 89.7 80.0 - 100.0 fL   MCH 29.2 26.0 - 34.0 pg   MCHC 32.6 30.0 - 36.0 g/dL   RDW 78.4 69.6 - 29.5 %   Platelets 442 (H) 150 - 400 K/uL   nRBC 0.0 0.0 - 0.2 %    Comment: Performed at [facility], [address] [place], [place] [address]  Magnesium      Status: None   Collection Time: 07/30/23  5:43 AM  Result Value Ref Range   Magnesium  2.0 1.7 - 2.4 mg/dL    Comment: Performed at [facility], [address] [place], [place] [address]  Basic metabolic panel with GFR     Status: Abnormal   Collection Time: 07/30/23  5:43 AM  Result Value Ref Range   Sodium 133 (L) 135 - 145 mmol/L   Potassium 4.5 3.5 - 5.1 mmol/L   Chloride 102 98 - 111 mmol/L   CO2 27 22 - 32 mmol/L   Glucose, Bld 145 (H) 70 - 99 mg/dL    Comment: Glucose reference range applies only to samples taken after fasting for at least 8 hours.   BUN 47 (H) 6 - 20 mg/dL   Creatinine, Ser 0.27 (H) 0.61 - 1.24 mg/dL   Calcium   7.9 (L) 8.9 - 10.3 mg/dL   GFR, Estimated >30 >86 mL/min    Comment: (NOTE) Calculated using the CKD-EPI Creatinine Equation (2021)    Anion gap 4 (L) 5 - 15    Comment: Performed at [facility], [address], [place], [place] [address]  Phosphorus     Status: None   Collection Time: 07/30/23  5:43 AM  Result Value Ref Range   Phosphorus 3.6 2.5 - 4.6 mg/dL    Comment: Performed at [facility], [address] [place], [place] [address]  Glucose, capillary     Status: Abnormal   Collection Time: 07/30/23  7:32 AM  Result Value Ref Range   Glucose-Capillary 152 (H) 70 - 99 mg/dL    Comment: Glucose reference range applies only to samples taken after fasting for at least 8 hours.  Glucose, capillary     Status: Abnormal    Collection Time: 07/30/23 11:43 AM  Result Value Ref Range   Glucose-Capillary 127 (H) 70 - 99 mg/dL    Comment: Glucose reference range applies only to samples taken after fasting for at least 8 hours.     Blood pressure 134/68, pulse (!) 103, temperature 98.3 F (36.8 C), resp. rate 19, SpO2 100%.  Medical Problem List and Plan: 1. 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  -patient may not shower  -ELOS/Goals: 12-14 days, PT/OT sup  -Admit to CIR  -Dr Julio Ohm note 5/26 indicated that wound VAC can be removed in about a week 2.  Antithrombotics: -DVT/anticoagulation:  Pharmaceutical: Lovenox  -antiplatelet therapy:  3. Pain Management: Continue oxycodone  5-10 mg PRN.   -monitor for breakthrough  -tylenol  PRN  4. Mood/Behavior/Sleep: LCSW to follow for evaluation and support.  -antipsychotic agents: N/A  -order for Trazadone prn   5. Neuropsych/cognition: This patient is capable of making decisions on his own behalf. 6. Skin/Wound Care: Maintain  wound VAC for 14 days (08/08/2023).   - Continue zinc and vitamin C  -Prevalon Boot ordered  7. Fluids/Electrolytes/Nutrition: monitor I/O, check CMET   -encourage fluids(AKI)   -Continue Juven supplements/ Diet: Carb modified  8. TDM2: Hemoglobin A1c 6.7 (stable) - Monitor B/S use SSI for elevated blood sugar--current BS rang 140-160s -Continue Semglee 5 units  -Continue Mounjaro  15 mg subcutaneous injection Friday (nonformulary requested/wife to bring from home). 9. HLD: Crestor  20 mg daily/pm 10.  HTN/Orthostatic hypotension: Monitor BP's TID -Valsartan  being held (RX note) -check orthostatic VS 11. Anemia: hx of anemia/ monitor CBC. 12.  Prolonged Qtc:  - Avoid QTc prolonging medications, monitor electrolytes 13.  AKI:  Monitor labs, ARB has been held 14.  Distal muscle atrophy and weakness.  Possible neuropathy related.  Consider outpatient EMG/nerve conduction study      Brandi L Leak,  NP 07/30/2023  I have personally performed a face to face diagnostic evaluation of this patient and formulated the key components of the plan.  Additionally, I have personally reviewed laboratory data, imaging studies, as well as relevant notes and concur with the physician assistant's documentation above.  The patient's status has not changed from the original H&P.  Any changes in documentation from the acute care chart have been noted above.  Lylia Sand, MD

## 2023-07-30 NOTE — Progress Notes (Signed)
 Pt arrived onto rehab unit and writer explained rehab plan of care with day to day expectations. Patient stated he will NOT be using any type of bed alarm/chair alarm. Patient states he has never tried to get up by himself and will not attempt to. Patient states it is extremely disturbing to him and he will not be participating if he has to use the alarms. Patient educated to PLEASE always call before using the bathroom or wanting to get out of bed, patient in agreement and states "I don't have alzheimers or anything so you don't have to worry about me, I will call when I want to get out of bed"

## 2023-07-30 NOTE — Progress Notes (Signed)
 Inpatient Rehabilitation Admission Medication Review by a Pharmacist  A complete drug regimen review was completed for this patient to identify any potential clinically significant medication issues.  High Risk Drug Classes Is patient taking? Indication by Medication  Antipsychotic No   Anticoagulant Yes Lovenox: VTE ppx  Antibiotic No   Opioid Yes Oxycodone : pain  Antiplatelet No   Hypoglycemics/insulin  Yes Semglee, SSI: DM Mounjaro : DM  Vasoactive Medication No   Chemotherapy No   Other Yes Tylenol : pain Crestor : HLD Trazodone: sleep Benadryl: itching Bisacodyl , Fleet: bowel regimen Compazine: N/V Mylanta: indigestion Robitussin: cough     Type of Medication Issue Identified Description of Issue Recommendation(s)  Drug Interaction(s) (clinically significant)     Duplicate Therapy     Allergy     No Medication Administration End Date     Incorrect Dose     Additional Drug Therapy Needed     Significant med changes from prior encounter (inform family/care partners about these prior to discharge). Valsartan  Restart or discontinue as appropriate. Communicate medication changes with patient/family at discharge  Other       Clinically significant medication issues were identified that warrant physician communication and completion of prescribed/recommended actions by midnight of the next day:  No   Time spent performing this drug regimen review (minutes): 30   Thank you for allowing pharmacy to be a part of this patient's care.   Victoria Grass, PharmD 07/09/2023 9:13 AM    **Pharmacist phone directory can be found on [email or web address] listed under [facility]**

## 2023-07-30 NOTE — Inpatient Diabetes Management (Addendum)
 Inpatient Diabetes Program Recommendations  AACE/ADA: New Consensus Statement on Inpatient Glycemic Control   Target Ranges:  Prepandial:   less than 140 mg/dL      Peak postprandial:   less than 180 mg/dL (1-2 hours)      Critically ill patients:  140 - 180 mg/dL    Latest Reference Range & Units 07/29/23 08:01 07/29/23 12:34 07/29/23 17:16 07/30/23 07:32  Glucose-Capillary 70 - 99 mg/dL 841 (H) 324 (H) 401 (H) 152 (H)   Review of Glycemic Control  Diabetes history: DM2 Outpatient Diabetes medications: Mounjaro  15 mg Qweek (Friday), Humalog  per sliding scale Current orders for Inpatient glycemic control: Semglee 5 units Q24H, Novolog  0-15 units TID with meals  Inpatient Diabetes Program Recommendations:    Discharge Recommendations:  Other recommendations: FS Libre Antillon acting recommendations: Insulin  Glargine (LANTUS) Solostar Pen 5 units daily  Short acting recommendations:  Correction coverage ONLY Insulin  lispro (HUMALOG ) KwikPen  Sensitive Scale.   Supply/Referral recommendations: Pen needles - standard Use Adult Diabetes Insulin  Treatment Post Discharge order set.   Thanks, Beacher Limerick, RN, MSN, CDCES Diabetes Coordinator Inpatient Diabetes Program [phone] (Team Pager from 8am to 5pm)

## 2023-07-30 NOTE — Progress Notes (Signed)
 Inpatient Rehab Admissions Coordinator:    I have a CIR bed for this pt. RN may call report to [phone].  Pt. To d/c to CIR for an estimated 12-14 days of intensive rehab with the goal of discharging home with assistance from wife, mother in law, and daughter.   Wandalee Gust, MS, CCC-SLP Rehab Admissions Coordinator  [phone] (celll) [phone] (office)

## 2023-07-30 NOTE — Hospital Course (Addendum)
 This 58 y.o. male with history of diabetes mellitus type 2 presents to the ED because of worsening wound on the left leg.  Patient states about 4 to 5 days ago he noticed small discoloration on the lateral aspect of his left foot which has rapidly progressed involving the whole left foot with discharge and blebs. He denies any recent trauma or insect bites.  Denies fever or chills.  Patient states over the last 1 week he has been feeling poorly with weakness and shortness of breath and some chest tightness. CT scan shows extensive gas formation in the entire left lower extremity and foot.  Dr. Hulda Mage orthopedic surgeon was consulted.  Initial plan was to take patient to OR in the morning, but later decided to continue aggressive IV antibiotics. Patient was admitted for further evaluation.  Subsequently patient was agreeable and underwent transtibial amputation and debridement of necrotizing fasciitis and he is postoperative day 7.  He was taken back to the OR for I&D and further debridement 526.  He is connected to wound VAC and orthopedic surgery cleared the patient for discharge.  He is medically stable for discharge at this time and will need follow-up with PCP and orthopedic surgeon outpatient setting as he is going to go to CIR for now  Assessment and Plan:  Necrotizing fasciitis of the left lower extremity: Patient presented with LLE cellulitis, necrosis , and foul smelling discharge. Appreciate orthopedic surgery consult. Initial Plan is to take patient to the OR in the morning.  Continued IV Zyvox and Zosyn.  Cultures so far no growth.Continue hydration , adequate pain control. Patient underwent left transtibial amputation and debridement of necrotizing fasciitis.  POD # 7. Patient was taken to OR for incision and drainage and further debridement 5/26 Patient is doing well.  Connected with wound VAC.  Patient is cleared for discharge to acute rehab. Follow-up Ortho recommendations.   Diabetes mellitus  type 2: He takes Mounjaro  at home.  Last hemoglobin A1c was 6.7 a month ago.   Given the acuity of patient's condition , started patient on insulin  infusion. Continue Lantus 5 units daily, regular insulin  sliding scale. Insulin  infusion discontinued. Follow up at CIR   Essential Hypertension: He takes ARB.  Will keep patient on as needed IV hydralazine for now. Will hold ARB due to AKI. CTM BP per Protocol. Last BP reading was 134/68   Acute on Chronic Anemia: Hgb/Hct Trend:  Recent Labs  Lab 07/22/23 1950 07/22/23 1950 07/22/23 1952 07/23/23 0410 07/23/23 1611 07/23/23 1952 07/29/23 0444 07/30/23 0543  HGB 11.9*  --  11.0* 9.4* 8.8* 9.5* 7.3* 7.1*  HCT 35.0*  --  32.5* 27.1* 26.0* 27.4* 22.2* 21.8*  MCV  --    < > 85.5 85.0  --  85.1 88.8 89.7   < > = values in this interval not displayed.  -CTM and Trend Hgb/Hct and transfuse for Hgb <7.0. Recommending obtaining Anemia Panel at CIR.    Hyperlipidemia: Continue Statin.  Hyponatremia: Mild. Na+ is now 133. CTM and Trend and repeat CMP w/in 1 week.    Acute kidney injury likely from Sepsis: -BUN/Cr Trend Improving: Recent Labs  Lab 07/23/23 0410 07/23/23 1952 07/24/23 0610 07/25/23 0727 07/26/23 0231 07/27/23 0458 07/29/23 0444 07/30/23 0543  BUN 57*  --  50* 49* 42* 39* 40* 47*  CREATININE 1.84* 1.74* 1.98* 1.86* 1.83* 1.68* 1.60* 1.32*  -Avoid Nephrotoxic Medications, Contrast Dyes, Hypotension and Dehydration to Ensure Adequate Renal Perfusion and will need to Renally  Adjust Meds. CTM and Trend Renal Function carefully and repeat CMP w/in 1 week   Hypokalemia: Improved. Recent Labs  Lab 07/23/23 1611 07/24/23 0610 07/25/23 0727 07/26/23 0231 07/27/23 0458 07/29/23 0444 07/30/23 0543  K 4.1 3.3* 4.0 4.4 4.3 4.7 4.5  -CTM and Trend and Repeat CMP w/in 1 week    Prolonged Qtc: Replace electrolytes and recheck EKG if necessary.   Leukocytosis: Fluctuating. WBC Trend:  Recent Labs  Lab 07/22/23 1952  07/23/23 0410 07/23/23 1952 07/29/23 0444 07/30/23 0543  WBC 44.6* 38.7* 30.3* 10.5 11.4*  -CTM and Trend and repeat CBC w/in 1 week  Elevated troponin secondary demand ischemia: Troponin trended down.  No ischemic changes. Follow up in the outpatient setting.

## 2023-07-30 NOTE — Progress Notes (Signed)
 PROGRESS NOTE    Scott Navarro.  UJW:[id number] DOB: [birth year] DOA: 07/22/2023 PCP: Claudene Crystal, PA-C   Brief Narrative:  This 58 y.o. male with history of diabetes mellitus type 2 presents to the ED because of worsening wound on the left leg.  Patient states about 4 to 5 days ago he noticed small discoloration on the lateral aspect of his left foot which has rapidly progressed involving the whole left foot with discharge and blebs. He denies any recent trauma or insect bites.  Denies fever or chills.  Patient states over the last 1 week he has been feeling poorly with weakness and shortness of breath and some chest tightness. CT scan shows extensive gas formation in the entire left lower extremity and foot.  Dr. Hulda Mage orthopedic surgeon was consulted.  Initial plan was to take patient to OR in the morning, but later decided to continue aggressive IV antibiotics. Patient was admitted for further evaluation.  Subsequently patient was agreeable and underwent transtibial amputation and debridement of necrotizing fasciitis and he is postoperative day 7.  He was taken back to the OR for I&D and further debridement 526.  He is connected to wound VAC and orthopedic surgery cleared the patient for discharge.  He is medically stable for discharge at this time and will need follow-up with PCP and orthopedic surgeon outpatient setting as he is going to go to CIR for now  Assessment and Plan:  Necrotizing fasciitis of the left lower extremity: Patient presented with LLE cellulitis, necrosis , and foul smelling discharge. Appreciate orthopedic surgery consult. Initial Plan is to take patient to the OR in the morning.  Continued IV Zyvox and Zosyn.  Cultures so far no growth.Continue hydration , adequate pain control. Patient underwent left transtibial amputation and debridement of necrotizing fasciitis.  POD # 7. Patient was taken to OR for incision and drainage and further debridement 5/26 Patient is  doing well.  Connected with wound VAC.  Patient is cleared for discharge to acute rehab. Follow-up Ortho recommendations.   Diabetes mellitus type 2: He takes Mounjaro  at home.  Last hemoglobin A1c was 6.7 a month ago.   Given the acuity of patient's condition , started patient on insulin  infusion. Continue Lantus 5 units daily, regular insulin  sliding scale. Insulin  infusion discontinued. Follow up at CIR   Essential Hypertension: He takes ARB.  Will keep patient on as needed IV hydralazine for now. Will hold ARB due to AKI. CTM BP per Protocol. Last BP reading was 134/68   Acute on Chronic Anemia: Hgb/Hct Trend:  Recent Labs  Lab 07/22/23 1950 07/22/23 1950 07/22/23 1952 07/23/23 0410 07/23/23 1611 07/23/23 1952 07/29/23 0444 07/30/23 0543  HGB 11.9*  --  11.0* 9.4* 8.8* 9.5* 7.3* 7.1*  HCT 35.0*  --  32.5* 27.1* 26.0* 27.4* 22.2* 21.8*  MCV  --    < > 85.5 85.0  --  85.1 88.8 89.7   < > = values in this interval not displayed.  -CTM and Trend Hgb/Hct and transfuse for Hgb <7.0. Recommending obtaining Anemia Panel at CIR.    Hyperlipidemia: Continue Statin.  Hyponatremia: Mild. Na+ is now 133. CTM and Trend and repeat CMP w/in 1 week.    Acute kidney injury likely from Sepsis: -BUN/Cr Trend Improving: Recent Labs  Lab 07/23/23 0410 07/23/23 1952 07/24/23 0610 07/25/23 0727 07/26/23 0231 07/27/23 0458 07/29/23 0444 07/30/23 0543  BUN 57*  --  50* 49* 42* 39* 40* 47*  CREATININE 1.84* 1.74*  1.98* 1.86* 1.83* 1.68* 1.60* 1.32*  -Avoid Nephrotoxic Medications, Contrast Dyes, Hypotension and Dehydration to Ensure Adequate Renal Perfusion and will need to Renally Adjust Meds. CTM and Trend Renal Function carefully and repeat CMP w/in 1 week   Hypokalemia: Improved. Recent Labs  Lab 07/23/23 1611 07/24/23 0610 07/25/23 0727 07/26/23 0231 07/27/23 0458 07/29/23 0444 07/30/23 0543  K 4.1 3.3* 4.0 4.4 4.3 4.7 4.5  -CTM and Trend and Repeat CMP w/in 1 week    Prolonged  Qtc: Replace electrolytes and recheck EKG if necessary.   Leukocytosis: Fluctuating. WBC Trend:  Recent Labs  Lab 07/22/23 1952 07/23/23 0410 07/23/23 1952 07/29/23 0444 07/30/23 0543  WBC 44.6* 38.7* 30.3* 10.5 11.4*  -CTM and Trend and repeat CBC w/in 1 week  Elevated troponin secondary demand ischemia: Troponin trended down.  No ischemic changes. Follow up in the outpatient setting.    DVT prophylaxis: Per Orthopedic Surgery    Code Status: Full Code Family Communication: No family present @ bedside  Disposition Plan:  Level of care: Med-Surg Status is: Inpatient Remains inpatient appropriate because: Getting transferred to CIR today   Consultants:  Orthopedic Surgery  Procedures:  As delineated as above  Antimicrobials:  Anti-infectives (From admission, onward)    Start     Dose/Rate Route Frequency Ordered Stop   07/25/23 0600  ceFAZolin  (ANCEF ) IVPB 2g/100 mL premix        2 g 200 mL/hr over 30 Minutes Intravenous On call to O.R. 07/24/23 1848 07/25/23 1225   07/23/23 1548  vancomycin (VANCOCIN) powder  Status:  Discontinued          As needed 07/23/23 1548 07/23/23 1629   07/23/23 1000  linezolid (ZYVOX) IVPB 600 mg        600 mg 300 mL/hr over 60 Minutes Intravenous Every 12 hours 07/22/23 2324 07/28/23 2254   07/23/23 0400  piperacillin-tazobactam (ZOSYN) IVPB 3.375 g        3.375 g 12.5 mL/hr over 240 Minutes Intravenous Every 8 hours 07/22/23 2323 07/29/23 0205   07/22/23 1930  piperacillin-tazobactam (ZOSYN) IVPB 3.375 g        3.375 g 100 mL/hr over 30 Minutes Intravenous  Once 07/22/23 1922 07/22/23 2110   07/22/23 1930  linezolid (ZYVOX) IVPB 600 mg        600 mg 300 mL/hr over 60 Minutes Intravenous  Once 07/22/23 1922 07/22/23 2200       Subjective: Seen and examined at bedside and was doing fairly well and denied any chest pain or shortness of.  Denies any current pain in his leg.  No nausea or vomiting.  Ready to go to CIR.  No other  concerns or complaints at this time.  Objective: Vitals:   07/29/23 0700 07/29/23 1524 07/30/23 0442 07/30/23 0731  BP: (!) 152/72 (!) 119/58 125/62 139/68  Pulse: 91 95 94 97  Resp: 17 16 17 18   Temp: 97.7 F (36.5 C) 98 F (36.7 C) 98.6 F (37 C) 98.1 F (36.7 C)  TempSrc: Oral Oral  Oral  SpO2: 100% 100% 98% 99%  Weight:      Height:        Intake/Output Summary (Last 24 hours) at 07/30/2023 1902 Last data filed at 07/30/2023 0803 Gross per 24 hour  Intake 472 ml  Output 200 ml  Net 272 ml   Filed Weights   07/23/23 1505 07/25/23 0850  Weight: 80.7 kg 84.8 kg   Examination: Physical Exam:  Constitutional: WN/WD slightly overweight  Caucasian male in no acute distress Respiratory: Diminished to auscultation bilaterally, no wheezing, rales, rhonchi or crackles. Normal respiratory effort and patient is not tachypenic. No accessory muscle use.  Unlabored breathing Cardiovascular: RRR, no murmurs / rubs / gallops. S1 and S2 auscultated.   Abdomen: Soft, non-tender, non-distended. Bowel sounds positive.  GU: Deferred. Musculoskeletal: No clubbing / cyanosis of digits/nails.  Has a left BKA Skin: No rashes, lesions, ulcers limited skin evaluation. No induration; Warm and dry.  Neurologic: CN 2-12 grossly intact with no focal deficits.  Romberg sign and cerebellar reflexes not assessed.  Psychiatric: Normal judgment and insight. Alert and oriented x 3. Normal mood and appropriate affect.   Data Reviewed: I have personally reviewed following labs and imaging studies  CBC: Recent Labs  Lab 07/23/23 1952 07/29/23 0444 07/30/23 0543  WBC 30.3* 10.5 11.4*  HGB 9.5* 7.3* 7.1*  HCT 27.4* 22.2* 21.8*  MCV 85.1 88.8 89.7  PLT 428* 418* 442*   Basic Metabolic Panel: Recent Labs  Lab 07/24/23 0610 07/25/23 0727 07/26/23 0231 07/27/23 0458 07/29/23 0444 07/30/23 0543  NA 137 139 136 136 134* 133*  K 3.3* 4.0 4.4 4.3 4.7 4.5  CL 104 107 105 104 99 102  CO2 22 23 24 25  30 27   GLUCOSE 207* 184* 228* 140* 131* 145*  BUN 50* 49* 42* 39* 40* 47*  CREATININE 1.98* 1.86* 1.83* 1.68* 1.60* 1.32*  CALCIUM  7.7* 7.8* 7.5* 7.8* 8.0* 7.9*  MG 1.9  --   --   --  1.8 2.0  PHOS 4.1  --   --   --   --  3.6   GFR: Estimated Creatinine Clearance: 67 mL/min (A) (by C-G formula based on SCr of 1.32 mg/dL (H)). Liver Function Tests: No results for input(s): "AST", "ALT", "ALKPHOS", "BILITOT", "PROT", "ALBUMIN" in the last 168 hours. No results for input(s): "LIPASE", "AMYLASE" in the last 168 hours. No results for input(s): "AMMONIA" in the last 168 hours. Coagulation Profile: No results for input(s): "INR", "PROTIME" in the last 168 hours. Cardiac Enzymes: No results for input(s): "CKTOTAL", "CKMB", "CKMBINDEX", "TROPONINI" in the last 168 hours. BNP (last 3 results) No results for input(s): "PROBNP" in the last 8760 hours. HbA1C: No results for input(s): "HGBA1C" in the last 72 hours. CBG: Recent Labs  Lab 07/29/23 1234 07/29/23 1716 07/30/23 0732 07/30/23 1143 07/30/23 1717  GLUCAP 150* 160* 152* 127* 168*   Lipid Profile: No results for input(s): "CHOL", "HDL", "LDLCALC", "TRIG", "CHOLHDL", "LDLDIRECT" in the last 72 hours. Thyroid Function Tests: No results for input(s): "TSH", "T4TOTAL", "FREET4", "T3FREE", "THYROIDAB" in the last 72 hours. Anemia Panel: No results for input(s): "VITAMINB12", "FOLATE", "FERRITIN", "TIBC", "IRON", "RETICCTPCT" in the last 72 hours. Sepsis Labs: No results for input(s): "PROCALCITON", "LATICACIDVEN" in the last 168 hours.  Recent Results (from the past 240 hours)  Resp panel by RT-PCR (RSV, Flu A&B, Covid) Anterior Nasal Swab     Status: None   Collection Time: 07/22/23  7:22 PM   Specimen: Anterior Nasal Swab  Result Value Ref Range Status   SARS Coronavirus 2 by RT PCR NEGATIVE NEGATIVE Final   Influenza A by PCR NEGATIVE NEGATIVE Final   Influenza B by PCR NEGATIVE NEGATIVE Final    Comment: (NOTE) The Xpert  Xpress SARS-CoV-2/FLU/RSV plus assay is intended as an aid in the diagnosis of influenza from Nasopharyngeal swab specimens and should not be used as a sole basis for treatment. Nasal washings and aspirates are unacceptable for Xpert  Xpress SARS-CoV-2/FLU/RSV testing.  Fact Sheet for Patients: BloggerCourse.com  Fact Sheet for Healthcare Providers: SeriousBroker.it  This test is not yet approved or cleared by the [place]  FDA and has been authorized for detection and/or diagnosis of SARS-CoV-2 by FDA under an Emergency Use Authorization (EUA). This EUA will remain in effect (meaning this test can be used) for the duration of the COVID-19 declaration under Section 564(b)(1) of the Act, 21 U.S.C. section 360bbb-3(b)(1), unless the authorization is terminated or revoked.     Resp Syncytial Virus by PCR NEGATIVE NEGATIVE Final    Comment: (NOTE) Fact Sheet for Patients: BloggerCourse.com  Fact Sheet for Healthcare Providers: SeriousBroker.it  This test is not yet approved or cleared by the [place]  FDA and has been authorized for detection and/or diagnosis of SARS-CoV-2 by FDA under an Emergency Use Authorization (EUA). This EUA will remain in effect (meaning this test can be used) for the duration of the COVID-19 declaration under Section 564(b)(1) of the Act, 21 U.S.C. section 360bbb-3(b)(1), unless the authorization is terminated or revoked.  Performed at [facility], [address] [place] [address], [place], [place] [address]   Blood Culture (routine x 2)     Status: None   Collection Time: 07/22/23  7:45 PM   Specimen: BLOOD  Result Value Ref Range Status   Specimen Description BLOOD RIGHT ANTECUBITAL  Final   Special Requests   Final    BOTTLES DRAWN AEROBIC AND ANAEROBIC Blood Culture adequate volume   Culture   Final    NO GROWTH 5 DAYS Performed at [facility], [address] [place], [place] [address]    Report Status 07/27/2023 FINAL  Final  Blood Culture (routine x 2)     Status: None   Collection Time: 07/22/23  7:50 PM   Specimen: BLOOD LEFT FOREARM  Result Value Ref Range Status   Specimen Description BLOOD LEFT FOREARM  Final   Special Requests   Final    BOTTLES DRAWN AEROBIC AND ANAEROBIC Blood Culture adequate volume   Culture   Final    NO GROWTH 5 DAYS Performed at [facility], [address] [place], [place] [address]    Report Status 07/27/2023 FINAL  Final  Aerobic/Anaerobic Culture w Gram Stain (surgical/deep wound)     Status: None   Collection Time: 07/23/23  3:54 PM   Specimen: PATH Amputaion Arm/Leg; Tissue  Result Value Ref Range Status   Specimen Description TISSUE  Final   Special Requests BELOW KNEE AMPUTATION SPEC A  Final   Gram Stain NO WBC SEEN NO ORGANISMS SEEN   Final   Culture   Final    No growth aerobically or anaerobically. Performed at [facility], [address] [place], [place] [address]    Report Status 07/28/2023 FINAL  Final  Aerobic/Anaerobic Culture w Gram Stain (surgical/deep wound)     Status: None   Collection Time: 07/23/23  3:56 PM   Specimen: PATH Amputaion Arm/Leg; Tissue  Result Value Ref Range Status   Specimen Description TISSUE  Final   Special Requests MEDIAN BELOW KNEE AMPUTATION SPEC B  Final   Gram Stain NO WBC SEEN NO ORGANISMS SEEN   Final   Culture   Final    No growth aerobically or anaerobically. Performed at [facility], [address], [place], [place] [address]    Report Status 07/28/2023 FINAL  Final  Aerobic/Anaerobic Culture w Gram Stain (surgical/deep wound)  Status: None   Collection Time: 07/23/23  3:56 PM   Specimen: PATH Amputaion Arm/Leg; Tissue  Result Value Ref Range Status   Specimen Description TISSUE  Final   Special Requests LATERAL BELOW KNEE AMPUTATION SPEC C  Final   Gram Stain   Final    ABUNDANT WBC  PRESENT, PREDOMINANTLY PMN NO ORGANISMS SEEN    Culture   Final    No growth aerobically or anaerobically. Performed at [facility], [address], [place], [place] [address]    Report Status 07/28/2023 FINAL  Final  Surgical pcr screen     Status: None   Collection Time: 07/24/23  6:35 PM   Specimen: Nasal Mucosa; Nasal Swab  Result Value Ref Range Status   MRSA, PCR NEGATIVE NEGATIVE Final   Staphylococcus aureus NEGATIVE NEGATIVE Final    Comment: (NOTE) The Xpert SA Assay (FDA approved for NASAL specimens in patients 70 years of age and older), is one component of a comprehensive surveillance program. It is not intended to diagnose infection nor to guide or monitor treatment. Performed at [facility], [address], [place], [place] [address]     Radiology Studies: No results found.   LOS: 8 days   Aura Leeds, DO [facility] Available via Epic secure chat 7am-7pm After these hours, please refer to coverage provider listed on [email or web address] 07/30/2023, 7:02 PM

## 2023-07-30 NOTE — Progress Notes (Incomplete)
 Call and gave report to [facility] on 4W. All question have been answered to it fullest. Pt is aware of the transfer. Pt does still have IV in place, saline lock. Pt will transfer will wound vac and nurse is aware. All belongings are pack and ready to go with patient.

## 2023-07-30 NOTE — Evaluation (Signed)
 Occupational Therapy Assessment and Plan  Patient Details  Name: Scott Navarro. MRN: [id number] Date of Birth: June 20, 1965  OT Diagnosis: {diagnoses:3041644} Rehab Potential:   ELOS:     {CHL IP REHAB OT TIME CALCULATIONS:304400400}    Hospital Problem: Principal Problem:   S/P BKA (below knee amputation), left (HCC) Active Problems:   Necrotizing fasciitis [facility])   Past Medical History:  Past Medical History:  Diagnosis Date   Type II diabetes mellitus (HCC) dx'd 10/08/2016   Past Surgical History:  Past Surgical History:  Procedure Laterality Date   AMPUTATION Right 10/09/2016   Procedure: INCISION AND DRAINAGE RIGHT GREAT TOE;  Surgeon: Timothy Ford, MD;  Location: [facility] OR;  Service: Orthopedics;  Laterality: Right;   AMPUTATION Left 07/23/2023   Procedure: AMPUTATION, BELOW KNEE, LEFT;  Surgeon: Timothy Ford, MD;  Location: [facility] OR;  Service: Orthopedics;  Laterality: Left;  IRRIGATION AND DEBRIDEMENT AND AMPUTATION OF LEFT LEG   IRRIGATION AND DEBRIDEMENT KNEE Left 07/25/2023   Procedure: IRRIGATION AND DEBRIDEMENT LEFT KNEE;  Surgeon: Timothy Ford, MD;  Location: [facility] OR;  Service: Orthopedics;  Laterality: Left;  DEBRIDEMENT LEFT LEG    Assessment & Plan Clinical Impression: Patient is a  58 year old right handed Caucasian male with significant PMHX of DM type 2 (recent A1c 6.7), Charcot arthropathy, chronic plantar ulceration, HTN, HLD and anemia.  Presented on 07/22/2023 due to 4-5 days of worsening wound on left foot along with some generalized weakness.  Patient reported small area of discoloration on the left lateral foot area rapidly progressed, now involving the entire foot. CT scan showed extensive soft tissue gas formation throughout the left leg and foot.  Admission labs showed WBC 44, hgb 11, creatinine 2.3, glucose 341, bicarb 23, lactic acid 1.3, potassium 3.2.  Chest xray unremarkable, EKG-sinus tachycardia.  On 07/23/23 Dr. Julio Ohm performed left transtibial amputation  and debridement of necrotizing fasciitis; patient was left open back placed. Pt returned to OR on 07/25/2023 for I&D. Prevena wound VAC secured upon closure. POD #7.  He denies phantom pain. Pain overall controlled with current medications.  He has chronic muscle atrophy in his hands.  Patient transferred to CIR on 07/30/2023 .    Patient currently requires {DGU:4403474} with {QVZ:5638756} secondary to {EPPIRJJOACZ:6606301}.  Prior to hospitalization, patient could complete *** with {SWF:0932355}.  Patient will benefit from skilled intervention to {benefit of skilled intervention:3041641} prior to discharge {discharge:3041642}.  Anticipate patient will require {supervision/assistance:22779} and {follow DD:2202542}.      OT Evaluation Precautions/Restrictions    Pain Pain Assessment Pain Scale: 0-10 Pain Score: 6  Pain Location: Leg Home Living/Prior Functioning Home Living Family/patient expects to be discharged to:: Private residence Living Arrangements: Spouse/significant other, Children Vision   Perception    Praxis   Cognition   Sensation   Motor     Trunk/Postural Assessment     Balance   Extremity/Trunk Assessment      Care Tool Care Tool Self Care Eating        Oral Care         Bathing              Upper Body Dressing(including orthotics)            Lower Body Dressing (excluding footwear)          Putting on/Taking off footwear             Care Tool Toileting Toileting activity  Care Tool Bed Mobility Roll left and right activity        Sit to lying activity        Lying to sitting on side of bed activity         Care Tool Transfers Sit to stand transfer        Chair/bed transfer         Toilet transfer         Care Tool Cognition  Expression of Ideas and Wants    Understanding Verbal and Non-Verbal Content     Memory/Recall Ability     Refer to Care Plan for [place] Term Goals  SHORT TERM GOAL WEEK  1    Recommendations for other services: {RECOMMENDATIONS FOR OTHER SERVICES:3049016}   Skilled Therapeutic Intervention ADL   Mobility    Skilled intervention   Discharge Criteria: Patient will be discharged from OT if patient refuses treatment 3 consecutive times without medical reason, if treatment goals not met, if there is a change in medical status, if patient makes no progress towards goals or if patient is discharged from hospital.  The above assessment, treatment plan, treatment alternatives and goals were discussed and mutually agreed upon: {Assessment/Treatment Plan Discussed/Agreed:3049017}  Carollee Circle, OTR/L,CBIS  Supplemental OT - [facility] and [facility] Secure Chat Preferred   07/30/2023, 7:15 PM

## 2023-07-30 NOTE — Discharge Summary (Signed)
 Physician Discharge Summary  Patient ID: Scott Navarro. MRN: [id number] DOB/AGE: [birth year] 59 y.o.  Admit date: 07/22/2023 Discharge date: 07/30/2023  Admission Diagnoses:  Principal Problem:   Necrotizing fasciitis [facility]) Active Problems:   Hypokalemia   Type 2 diabetes mellitus with hyperglycemia (HCC)   Charcot joint of left foot   Anemia   Hyponatremia   ARF (acute renal failure) (HCC)   Hyperlipidemia   Amputation below knee [facility])   Discharge Diagnoses:  Same  Past Medical History:  Diagnosis Date   Type II diabetes mellitus (HCC) dx'd 10/08/2016    Surgeries: Procedure(s): IRRIGATION AND DEBRIDEMENT LEFT KNEE on 07/25/2023   Consultants: Treatment Team:  Donnamarie Gables, MD Timothy Ford, MD  Discharged Condition: Improved  Hospital Course: Scott Navarro. is an 58 y.o. male who was admitted 07/22/2023 with a chief complaint of  Chief Complaint  Patient presents with   Wound Check  , and found to have a diagnosis of Necrotizing fasciitis (HCC).  They were brought to the operating room on 07/25/2023 and underwent the above named procedures.    They were given perioperative antibiotics:  Anti-infectives (From admission, onward)    Start     Dose/Rate Route Frequency Ordered Stop   07/25/23 0600  ceFAZolin  (ANCEF ) IVPB 2g/100 mL premix        2 g 200 mL/hr over 30 Minutes Intravenous On call to O.R. 07/24/23 1848 07/25/23 1225   07/23/23 1548  vancomycin (VANCOCIN) powder  Status:  Discontinued          As needed 07/23/23 1548 07/23/23 1629   07/23/23 1000  linezolid (ZYVOX) IVPB 600 mg        600 mg 300 mL/hr over 60 Minutes Intravenous Every 12 hours 07/22/23 2324 07/28/23 2254   07/23/23 0400  piperacillin-tazobactam (ZOSYN) IVPB 3.375 g        3.375 g 12.5 mL/hr over 240 Minutes Intravenous Every 8 hours 07/22/23 2323 07/29/23 0205   07/22/23 1930  piperacillin-tazobactam (ZOSYN) IVPB 3.375 g        3.375 g 100 mL/hr over 30 Minutes  Intravenous  Once 07/22/23 1922 07/22/23 2110   07/22/23 1930  linezolid (ZYVOX) IVPB 600 mg        600 mg 300 mL/hr over 60 Minutes Intravenous  Once 07/22/23 1922 07/22/23 2200     .  They were given compression stockings, early ambulation, and chemoprophylaxis for DVT prophylaxis.  They benefited maximally from their hospital stay and there were no complications.    Recent vital signs:  Vitals:   07/30/23 0442 07/30/23 0731  BP: 125/62 139/68  Pulse: 94 97  Resp: 17 18  Temp: 98.6 F (37 C) 98.1 F (36.7 C)  SpO2: 98% 99%    Recent laboratory studies:  Results for orders placed or performed during the hospital encounter of 07/22/23  CBG monitoring, ED   Collection Time: 07/22/23  7:01 PM  Result Value Ref Range   Glucose-Capillary 329 (H) 70 - 99 mg/dL  Resp panel by RT-PCR (RSV, Flu A&B, Covid) Anterior Nasal Swab   Collection Time: 07/22/23  7:22 PM   Specimen: Anterior Nasal Swab  Result Value Ref Range   SARS Coronavirus 2 by RT PCR NEGATIVE NEGATIVE   Influenza A by PCR NEGATIVE NEGATIVE   Influenza B by PCR NEGATIVE NEGATIVE   Resp Syncytial Virus by PCR NEGATIVE NEGATIVE  Urinalysis, w/ Reflex to Culture (Infection Suspected) -Urine, Clean Catch   Collection Time:  07/22/23  7:22 PM  Result Value Ref Range   Specimen Source URINE, CLEAN CATCH    Color, Urine YELLOW YELLOW   APPearance HAZY (A) CLEAR   Specific Gravity, Urine 1.023 1.005 - 1.030   pH 5.0 5.0 - 8.0   Glucose, UA 150 (A) NEGATIVE mg/dL   Hgb urine dipstick SMALL (A) NEGATIVE   Bilirubin Urine NEGATIVE NEGATIVE   Ketones, ur NEGATIVE NEGATIVE mg/dL   Protein, ur 30 (A) NEGATIVE mg/dL   Nitrite NEGATIVE NEGATIVE   Leukocytes,Ua NEGATIVE NEGATIVE   RBC / HPF 0-5 0 - 5 RBC/hpf   WBC, UA 0-5 0 - 5 WBC/hpf   Bacteria, UA RARE (A) NONE SEEN   Squamous Epithelial / HPF 0-5 0 - 5 /HPF   Mucus PRESENT    Uric Acid Crys, UA PRESENT   Blood Culture (routine x 2)   Collection Time: 07/22/23  7:45  PM   Specimen: BLOOD  Result Value Ref Range   Specimen Description BLOOD RIGHT ANTECUBITAL    Special Requests      BOTTLES DRAWN AEROBIC AND ANAEROBIC Blood Culture adequate volume   Culture      NO GROWTH 5 DAYS Performed at [facility], [address] [place], [place] [address]    Report Status 07/27/2023 FINAL   Blood Culture (routine x 2)   Collection Time: 07/22/23  7:50 PM   Specimen: BLOOD LEFT FOREARM  Result Value Ref Range   Specimen Description BLOOD LEFT FOREARM    Special Requests      BOTTLES DRAWN AEROBIC AND ANAEROBIC Blood Culture adequate volume   Culture      NO GROWTH 5 DAYS Performed at [facility], [address], [place], [place] [address]    Report Status 07/27/2023 FINAL   I-stat chem 8, ED   Collection Time: 07/22/23  7:50 PM  Result Value Ref Range   Sodium 135 135 - 145 mmol/L   Potassium 3.4 (L) 3.5 - 5.1 mmol/L   Chloride 99 98 - 111 mmol/L   BUN 60 (H) 6 - 20 mg/dL   Creatinine, Ser 0.98 (H) 0.61 - 1.24 mg/dL   Glucose, Bld 119 (H) 70 - 99 mg/dL   Calcium , Ion 1.14 (L) 1.15 - 1.40 mmol/L   TCO2 19 (L) 22 - 32 mmol/L   Hemoglobin 11.9 (L) 13.0 - 17.0 g/dL   HCT 14.7 (L) 82.9 - 56.2 %  I-Stat Lactic Acid, ED   Collection Time: 07/22/23  7:51 PM  Result Value Ref Range   Lactic Acid, Venous 1.3 0.5 - 1.9 mmol/L  Comprehensive metabolic panel   Collection Time: 07/22/23  7:52 PM  Result Value Ref Range   Sodium 134 (L) 135 - 145 mmol/L   Potassium 3.3 (L) 3.5 - 5.1 mmol/L   Chloride 99 98 - 111 mmol/L   CO2 23 22 - 32 mmol/L   Glucose, Bld 341 (H) 70 - 99 mg/dL   BUN 67 (H) 6 - 20 mg/dL   Creatinine, Ser 1.30 (H) 0.61 - 1.24 mg/dL   Calcium  8.4 (L) 8.9 - 10.3 mg/dL   Total Protein 7.0 6.5 - 8.1 g/dL   Albumin 2.0 (L) 3.5 - 5.0 g/dL   AST 63 (H) 15 - 41 U/L   ALT 42 0 - 44 U/L   Alkaline Phosphatase 204 (H) 38 - 126 U/L   Total Bilirubin 1.0 0.0 - 1.2 mg/dL   GFR, Estimated 31 (L) >60 mL/min  Anion gap 12 5 - 15   CBC with Differential   Collection Time: 07/22/23  7:52 PM  Result Value Ref Range   WBC 44.6 (H) 4.0 - 10.5 K/uL   RBC 3.80 (L) 4.22 - 5.81 MIL/uL   Hemoglobin 11.0 (L) 13.0 - 17.0 g/dL   HCT 91.4 (L) 78.2 - 95.6 %   MCV 85.5 80.0 - 100.0 fL   MCH 28.9 26.0 - 34.0 pg   MCHC 33.8 30.0 - 36.0 g/dL   RDW 21.3 08.6 - 57.8 %   Platelets 512 (H) 150 - 400 K/uL   nRBC 0.0 0.0 - 0.2 %   Neutrophils Relative % 89 %   Neutro Abs 39.8 (H) 1.7 - 7.7 K/uL   Lymphocytes Relative 3 %   Lymphs Abs 1.1 0.7 - 4.0 K/uL   Monocytes Relative 4 %   Monocytes Absolute 1.8 (H) 0.1 - 1.0 K/uL   Eosinophils Relative 0 %   Eosinophils Absolute 0.1 0.0 - 0.5 K/uL   Basophils Relative 0 %   Basophils Absolute 0.2 (H) 0.0 - 0.1 K/uL   WBC Morphology DOHLE BODIES    RBC Morphology MORPHOLOGY UNREMARKABLE    Smear Review MORPHOLOGY UNREMARKABLE    Immature Granulocytes 4 %   Abs Immature Granulocytes 1.65 (H) 0.00 - 0.07 K/uL  Protime-INR   Collection Time: 07/22/23  7:52 PM  Result Value Ref Range   Prothrombin Time 15.5 (H) 11.4 - 15.2 seconds   INR 1.2 0.8 - 1.2  HIV Antibody (routine testing w rflx)   Collection Time: 07/23/23 12:41 AM  Result Value Ref Range   HIV Screen 4th Generation wRfx Non Reactive Non Reactive  Basic metabolic panel   Collection Time: 07/23/23 12:41 AM  Result Value Ref Range   Sodium 134 (L) 135 - 145 mmol/L   Potassium 3.2 (L) 3.5 - 5.1 mmol/L   Chloride 101 98 - 111 mmol/L   CO2 19 (L) 22 - 32 mmol/L   Glucose, Bld 325 (H) 70 - 99 mg/dL   BUN 61 (H) 6 - 20 mg/dL   Creatinine, Ser 4.69 (H) 0.61 - 1.24 mg/dL   Calcium  8.1 (L) 8.9 - 10.3 mg/dL   GFR, Estimated 40 (L) >60 mL/min   Anion gap 14 5 - 15  I-Stat Lactic Acid, ED   Collection Time: 07/23/23 12:47 AM  Result Value Ref Range   Lactic Acid, Venous 1.3 0.5 - 1.9 mmol/L  CBG monitoring, ED   Collection Time: 07/23/23 12:51 AM  Result Value Ref Range   Glucose-Capillary 316 (H) 70 - 99 mg/dL  CBG  monitoring, ED   Collection Time: 07/23/23  1:53 AM  Result Value Ref Range   Glucose-Capillary 240 (H) 70 - 99 mg/dL   Comment 1 Notify RN    Comment 2 Document in Chart   CBG monitoring, ED   Collection Time: 07/23/23  2:57 AM  Result Value Ref Range   Glucose-Capillary 185 (H) 70 - 99 mg/dL  CBG monitoring, ED   Collection Time: 07/23/23  4:06 AM  Result Value Ref Range   Glucose-Capillary 153 (H) 70 - 99 mg/dL  CK   Collection Time: 07/23/23  4:10 AM  Result Value Ref Range   Total CK 29 (L) 49 - 397 U/L  CBC with Differential/Platelet   Collection Time: 07/23/23  4:10 AM  Result Value Ref Range   WBC 38.7 (H) 4.0 - 10.5 K/uL   RBC 3.19 (L) 4.22 - 5.81 MIL/uL  Hemoglobin 9.4 (L) 13.0 - 17.0 g/dL   HCT 40.9 (L) 81.1 - 91.4 %   MCV 85.0 80.0 - 100.0 fL   MCH 29.5 26.0 - 34.0 pg   MCHC 34.7 30.0 - 36.0 g/dL   RDW 78.2 95.6 - 21.3 %   Platelets 436 (H) 150 - 400 K/uL   nRBC 0.0 0.0 - 0.2 %   Neutrophils Relative % 93 %   Neutro Abs 36.0 (H) 1.7 - 7.7 K/uL   Lymphocytes Relative 3 %   Lymphs Abs 1.2 0.7 - 4.0 K/uL   Monocytes Relative 3 %   Monocytes Absolute 1.2 (H) 0.1 - 1.0 K/uL   Eosinophils Relative 1 %   Eosinophils Absolute 0.4 0.0 - 0.5 K/uL   Basophils Relative 0 %   Basophils Absolute 0.0 0.0 - 0.1 K/uL   WBC Morphology See Note    RBC Morphology BURR CELLS    Smear Review See Note    nRBC 0 0 /100 WBC   Abs Immature Granulocytes 0.00 0.00 - 0.07 K/uL  Basic metabolic panel   Collection Time: 07/23/23  4:10 AM  Result Value Ref Range   Sodium 135 135 - 145 mmol/L   Potassium 3.7 3.5 - 5.1 mmol/L   Chloride 102 98 - 111 mmol/L   CO2 20 (L) 22 - 32 mmol/L   Glucose, Bld 170 (H) 70 - 99 mg/dL   BUN 57 (H) 6 - 20 mg/dL   Creatinine, Ser 0.86 (H) 0.61 - 1.24 mg/dL   Calcium  8.1 (L) 8.9 - 10.3 mg/dL   GFR, Estimated 42 (L) >60 mL/min   Anion gap 13 5 - 15  Troponin I (High Sensitivity)   Collection Time: 07/23/23  4:10 AM  Result Value Ref Range    Troponin I (High Sensitivity) 28 (H) <18 ng/L  CBG monitoring, ED   Collection Time: 07/23/23  5:08 AM  Result Value Ref Range   Glucose-Capillary 136 (H) 70 - 99 mg/dL  Hemoglobin V7Q   Collection Time: 07/23/23  5:34 AM  Result Value Ref Range   Hgb A1c MFr Bld 6.7 (H) 4.8 - 5.6 %   Mean Plasma Glucose 145.59 mg/dL  CBG monitoring, ED   Collection Time: 07/23/23  6:26 AM  Result Value Ref Range   Glucose-Capillary 149 (H) 70 - 99 mg/dL  Troponin I (High Sensitivity)   Collection Time: 07/23/23  7:50 AM  Result Value Ref Range   Troponin I (High Sensitivity) 17 <18 ng/L  CBG monitoring, ED   Collection Time: 07/23/23  8:34 AM  Result Value Ref Range   Glucose-Capillary 145 (H) 70 - 99 mg/dL  CBG monitoring, ED   Collection Time: 07/23/23 10:43 AM  Result Value Ref Range   Glucose-Capillary 137 (H) 70 - 99 mg/dL  Prepare RBC (crossmatch) INTRAOP ONLY   Collection Time: 07/23/23  1:05 PM  Result Value Ref Range   Order Confirmation      ORDER PROCESSED BY BLOOD BANK Performed at [facility], [address], [place], [place] [address]   Type and screen [facility]   Collection Time: 07/23/23  2:07 PM  Result Value Ref Range   ABO/RH(D) A POS    Antibody Screen NEG    Sample Expiration 07/26/2023,2359    Unit Number [id number]    Blood Component Type RED CELLS,LR    Unit division 00    Status of Unit REL FROM [facility]    Transfusion Status OK TO  TRANSFUSE    Crossmatch Result Compatible    Unit Number [id number]    Blood Component Type RED CELLS,LR    Unit division 00    Status of Unit ISSUED,FINAL    Transfusion Status OK TO TRANSFUSE    Crossmatch Result      Compatible Performed at [facility], [address], [place], [place] [address]   BPAM [facility]   Collection Time: 07/23/23  2:07 PM  Result Value Ref Range   ISSUE DATE / TIME 244010272536    Blood Product Unit Number [id number]    PRODUCT CODE [id number]    Unit Type and  Rh 6200    Blood Product Expiration Date 295188416606    ISSUE DATE / TIME 301601093235    Blood Product Unit Number [id number]    PRODUCT CODE [id number]    Unit Type and Rh 6200    Blood Product Expiration Date [id number]   ABO/Rh   Collection Time: 07/23/23  2:20 PM  Result Value Ref Range   ABO/RH(D)      A POS Performed at [facility], [address] [place], [place] [address]   Glucose, capillary   Collection Time: 07/23/23  3:13 PM  Result Value Ref Range   Glucose-Capillary 188 (H) 70 - 99 mg/dL   Comment 1 Notify RN    Comment 2 Document in Chart   Surgical pathology   Collection Time: 07/23/23  3:50 PM  Result Value Ref Range   SURGICAL PATHOLOGY      SURGICAL PATHOLOGY CASE: [id number] PATIENT: Jaevian Mertens Surgical Pathology Report     Clinical History: necrotizing fasciotomy (cm)     FINAL MICROSCOPIC DIAGNOSIS:  A. LEFT LEG, BELOW KNEE AMPUTATION: Gangrenous and coagulative necrosis with marked acute cellulitis and acute osteomyelitis Proximal skin and soft tissue margins grossly viable Arteriolar sclerosis and arteriolosclerosis Complicated stenosing atherosclerosis of peroneal and posterior tibial artery with calcification   GROSS DESCRIPTION:  Received fresh is a 35 cm length product of left below the knee amputation which has pink-white to hyperemic skin with grossly viable skin and soft tissue margins.  There are 1.5 and 2 cm of exposed fibula and tibia respectively at the margin.  All 5 toes are present and unremarkable.  Extending from the ventral surface of foot and across lateral foot to posterior lower leg/foot is a 27 x 9.5 cm area of green-brown black discoloration with diffuse skin  slippage and underlying diffusely necrotic tissue.  Over the plantar surface is a 3 cm in diameter crusted area.  Over the mid lower leg is a 6 cm in diameter area of skin slippage with underlying pink-red tissue.  There is diffusely  necrotic tissue of the lower leg.  On sectioning, anterior tibial, peroneal and posterior tibial arteries have mild to moderate atherosclerotic disease. Block summary: Block 1 = skin and underlying tissue, lower leg Block 2 = crusted area of plantar surface Block 3 = bone in area of necrotic tissue, lower leg Block 4 = anterior tibial artery Block 5 = peroneal artery Block 6 = posterior tibial artery Bone and arteries are decalcified.  SW 07/25/2023  Final Diagnosis performed by Judithann Novas, MD.   Electronically signed 07/29/2023 Technical and / or Professional components performed at [facility], [address], [place], [place] [address].  Immunohistochemistry Technical component (if applicable) was performed  at [facility] [address], [place], [place] [address].   IMMUNOHISTOCHEMISTRY DISCLAIMER (if  applicable): Some of these immunohistochemical stains may have been developed and the performance characteristics determine by [facility]. Some may not have been cleared or approved by the U.S. Food and Drug Administration. The FDA has determined that such clearance or approval is not necessary. This test is used for clinical purposes. It should not be regarded as investigational or for research. This laboratory is certified under the Clinical Laboratory Improvement Amendments of 1988 (CLIA-88) as qualified to perform high complexity clinical laboratory testing.  The controls stained appropriately.   IHC stains are performed on formalin fixed, paraffin embedded tissue using a 3,3"diaminobenzidine (DAB) chromogen and Leica Bond Autostainer System. The staining intensity of the nucleus is score manually and is reported as the percentage  of tumor cell nuclei demonstrating specific nuclear staining. The specimens are fixed in 10% Neutral Formalin for at least 6 hours and up to 72hrs. These tests are validated on  decalcified tissue. Results should be interpreted with caution given the possibility of false negative results on decalcified specimens. Antibody Clones are as follows ER-clone 7F, PR-clone 16, Ki67- clone MM1. Some of these immunohistochemical stains may have been developed and the performance characteristics determined by [facility].   Aerobic/Anaerobic Culture w Gram Stain (surgical/deep wound)   Collection Time: 07/23/23  3:54 PM   Specimen: PATH Amputaion Arm/Leg; Tissue  Result Value Ref Range   Specimen Description TISSUE    Special Requests BELOW KNEE AMPUTATION SPEC A    Gram Stain NO WBC SEEN NO ORGANISMS SEEN     Culture      No growth aerobically or anaerobically. Performed at [facility], [address] [place], [place] [address]    Report Status 07/28/2023 FINAL   Aerobic/Anaerobic Culture w Gram Stain (surgical/deep wound)   Collection Time: 07/23/23  3:56 PM   Specimen: PATH Amputaion Arm/Leg; Tissue  Result Value Ref Range   Specimen Description TISSUE    Special Requests MEDIAN BELOW KNEE AMPUTATION SPEC B    Gram Stain NO WBC SEEN NO ORGANISMS SEEN     Culture      No growth aerobically or anaerobically. Performed at [facility], [address] [place], [place] [address]    Report Status 07/28/2023 FINAL   Aerobic/Anaerobic Culture w Gram Stain (surgical/deep wound)   Collection Time: 07/23/23  3:56 PM   Specimen: PATH Amputaion Arm/Leg; Tissue  Result Value Ref Range   Specimen Description TISSUE    Special Requests LATERAL BELOW KNEE AMPUTATION SPEC C    Gram Stain      ABUNDANT WBC PRESENT, PREDOMINANTLY PMN NO ORGANISMS SEEN    Culture      No growth aerobically or anaerobically. Performed at [facility], [address], [place], [place] [address]    Report Status 07/28/2023 FINAL   I-STAT 7, (LYTES, BLD GAS, ICA, H+H)   Collection Time: 07/23/23  4:11 PM  Result Value Ref Range   pH, Arterial 7.376 7.35 -  7.45   pCO2 arterial 32.0 32 - 48 mmHg   pO2, Arterial 135 (H) 83 - 108 mmHg   Bicarbonate 18.9 (L) 20.0 - 28.0 mmol/L   TCO2 20 (L) 22 - 32 mmol/L   O2 Saturation 99 %   Acid-base deficit 6.0 (H) 0.0 - 2.0 mmol/L   Sodium 134 (L) 135 - 145 mmol/L   Potassium 4.1 3.5 - 5.1 mmol/L   Calcium , Ion 1.12 (L) 1.15 - 1.40 mmol/L   HCT 26.0 (L)  39.0 - 52.0 %   Hemoglobin 8.8 (L) 13.0 - 17.0 g/dL   Patient temperature 16.1 C    Sample type ARTERIAL   Glucose, capillary   Collection Time: 07/23/23  4:39 PM  Result Value Ref Range   Glucose-Capillary 214 (H) 70 - 99 mg/dL   Comment 1 Notify RN   Glucose, capillary   Collection Time: 07/23/23  5:46 PM  Result Value Ref Range   Glucose-Capillary 224 (H) 70 - 99 mg/dL  CBC   Collection Time: 07/23/23  7:52 PM  Result Value Ref Range   WBC 30.3 (H) 4.0 - 10.5 K/uL   RBC 3.22 (L) 4.22 - 5.81 MIL/uL   Hemoglobin 9.5 (L) 13.0 - 17.0 g/dL   HCT 09.6 (L) 04.5 - 40.9 %   MCV 85.1 80.0 - 100.0 fL   MCH 29.5 26.0 - 34.0 pg   MCHC 34.7 30.0 - 36.0 g/dL   RDW 81.1 91.4 - 78.2 %   Platelets 428 (H) 150 - 400 K/uL   nRBC 0.0 0.0 - 0.2 %  Creatinine, serum   Collection Time: 07/23/23  7:52 PM  Result Value Ref Range   Creatinine, Ser 1.74 (H) 0.61 - 1.24 mg/dL   GFR, Estimated 45 (L) >60 mL/min  Glucose, capillary   Collection Time: 07/23/23  7:57 PM  Result Value Ref Range   Glucose-Capillary 216 (H) 70 - 99 mg/dL  Basic metabolic panel   Collection Time: 07/24/23  6:10 AM  Result Value Ref Range   Sodium 137 135 - 145 mmol/L   Potassium 3.3 (L) 3.5 - 5.1 mmol/L   Chloride 104 98 - 111 mmol/L   CO2 22 22 - 32 mmol/L   Glucose, Bld 207 (H) 70 - 99 mg/dL   BUN 50 (H) 6 - 20 mg/dL   Creatinine, Ser 9.56 (H) 0.61 - 1.24 mg/dL   Calcium  7.7 (L) 8.9 - 10.3 mg/dL   GFR, Estimated 38 (L) >60 mL/min   Anion gap 11 5 - 15  Magnesium    Collection Time: 07/24/23  6:10 AM  Result Value Ref Range   Magnesium  1.9 1.7 - 2.4 mg/dL  Phosphorus    Collection Time: 07/24/23  6:10 AM  Result Value Ref Range   Phosphorus 4.1 2.5 - 4.6 mg/dL  Glucose, capillary   Collection Time: 07/24/23  9:00 AM  Result Value Ref Range   Glucose-Capillary 203 (H) 70 - 99 mg/dL  Glucose, capillary   Collection Time: 07/24/23 12:13 PM  Result Value Ref Range   Glucose-Capillary 192 (H) 70 - 99 mg/dL  Glucose, capillary   Collection Time: 07/24/23  4:09 PM  Result Value Ref Range   Glucose-Capillary 210 (H) 70 - 99 mg/dL  Glucose, capillary   Collection Time: 07/24/23  5:38 PM  Result Value Ref Range   Glucose-Capillary 183 (H) 70 - 99 mg/dL  Surgical pcr screen   Collection Time: 07/24/23  6:35 PM   Specimen: Nasal Mucosa; Nasal Swab  Result Value Ref Range   MRSA, PCR NEGATIVE NEGATIVE   Staphylococcus aureus NEGATIVE NEGATIVE  Glucose, capillary   Collection Time: 07/24/23  8:09 PM  Result Value Ref Range   Glucose-Capillary 168 (H) 70 - 99 mg/dL  Basic metabolic panel   Collection Time: 07/25/23  7:27 AM  Result Value Ref Range   Sodium 139 135 - 145 mmol/L   Potassium 4.0 3.5 - 5.1 mmol/L   Chloride 107 98 - 111 mmol/L   CO2 23 22 -  32 mmol/L   Glucose, Bld 184 (H) 70 - 99 mg/dL   BUN 49 (H) 6 - 20 mg/dL   Creatinine, Ser 1.61 (H) 0.61 - 1.24 mg/dL   Calcium  7.8 (L) 8.9 - 10.3 mg/dL   GFR, Estimated 41 (L) >60 mL/min   Anion gap 9 5 - 15  Glucose, capillary   Collection Time: 07/25/23  7:58 AM  Result Value Ref Range   Glucose-Capillary 191 (H) 70 - 99 mg/dL  Glucose, capillary   Collection Time: 07/25/23 10:37 AM  Result Value Ref Range   Glucose-Capillary 180 (H) 70 - 99 mg/dL  Glucose, capillary   Collection Time: 07/25/23 12:46 PM  Result Value Ref Range   Glucose-Capillary 187 (H) 70 - 99 mg/dL  Glucose, capillary   Collection Time: 07/25/23  4:47 PM  Result Value Ref Range   Glucose-Capillary 205 (H) 70 - 99 mg/dL  Glucose, capillary   Collection Time: 07/25/23  8:59 PM  Result Value Ref Range    Glucose-Capillary 170 (H) 70 - 99 mg/dL  Basic metabolic panel   Collection Time: 07/26/23  2:31 AM  Result Value Ref Range   Sodium 136 135 - 145 mmol/L   Potassium 4.4 3.5 - 5.1 mmol/L   Chloride 105 98 - 111 mmol/L   CO2 24 22 - 32 mmol/L   Glucose, Bld 228 (H) 70 - 99 mg/dL   BUN 42 (H) 6 - 20 mg/dL   Creatinine, Ser 0.96 (H) 0.61 - 1.24 mg/dL   Calcium  7.5 (L) 8.9 - 10.3 mg/dL   GFR, Estimated 42 (L) >60 mL/min   Anion gap 7 5 - 15  Glucose, capillary   Collection Time: 07/26/23  7:34 AM  Result Value Ref Range   Glucose-Capillary 170 (H) 70 - 99 mg/dL  Glucose, capillary   Collection Time: 07/26/23 11:43 AM  Result Value Ref Range   Glucose-Capillary 268 (H) 70 - 99 mg/dL  Glucose, capillary   Collection Time: 07/26/23  5:12 PM  Result Value Ref Range   Glucose-Capillary 210 (H) 70 - 99 mg/dL  Basic metabolic panel   Collection Time: 07/27/23  4:58 AM  Result Value Ref Range   Sodium 136 135 - 145 mmol/L   Potassium 4.3 3.5 - 5.1 mmol/L   Chloride 104 98 - 111 mmol/L   CO2 25 22 - 32 mmol/L   Glucose, Bld 140 (H) 70 - 99 mg/dL   BUN 39 (H) 6 - 20 mg/dL   Creatinine, Ser 0.45 (H) 0.61 - 1.24 mg/dL   Calcium  7.8 (L) 8.9 - 10.3 mg/dL   GFR, Estimated 47 (L) >60 mL/min   Anion gap 7 5 - 15  Glucose, capillary   Collection Time: 07/27/23  8:05 AM  Result Value Ref Range   Glucose-Capillary 179 (H) 70 - 99 mg/dL   Comment 1 Notify RN   Glucose, capillary   Collection Time: 07/27/23 11:54 AM  Result Value Ref Range   Glucose-Capillary 196 (H) 70 - 99 mg/dL   Comment 1 Notify RN   Glucose, capillary   Collection Time: 07/27/23  4:35 PM  Result Value Ref Range   Glucose-Capillary 220 (H) 70 - 99 mg/dL  Glucose, capillary   Collection Time: 07/27/23  8:38 PM  Result Value Ref Range   Glucose-Capillary 186 (H) 70 - 99 mg/dL  Glucose, capillary   Collection Time: 07/28/23  7:58 AM  Result Value Ref Range   Glucose-Capillary 156 (H) 70 - 99 mg/dL  Glucose,  capillary   Collection Time: 07/28/23 12:09 PM  Result Value Ref Range   Glucose-Capillary 121 (H) 70 - 99 mg/dL  Glucose, capillary   Collection Time: 07/28/23  4:18 PM  Result Value Ref Range   Glucose-Capillary 192 (H) 70 - 99 mg/dL  Glucose, capillary   Collection Time: 07/28/23  8:38 PM  Result Value Ref Range   Glucose-Capillary 138 (H) 70 - 99 mg/dL  CBC   Collection Time: 07/29/23  4:44 AM  Result Value Ref Range   WBC 10.5 4.0 - 10.5 K/uL   RBC 2.50 (L) 4.22 - 5.81 MIL/uL   Hemoglobin 7.3 (L) 13.0 - 17.0 g/dL   HCT 40.9 (L) 81.1 - 91.4 %   MCV 88.8 80.0 - 100.0 fL   MCH 29.2 26.0 - 34.0 pg   MCHC 32.9 30.0 - 36.0 g/dL   RDW 78.2 95.6 - 21.3 %   Platelets 418 (H) 150 - 400 K/uL   nRBC 0.0 0.0 - 0.2 %  Magnesium    Collection Time: 07/29/23  4:44 AM  Result Value Ref Range   Magnesium  1.8 1.7 - 2.4 mg/dL  Basic metabolic panel with GFR   Collection Time: 07/29/23  4:44 AM  Result Value Ref Range   Sodium 134 (L) 135 - 145 mmol/L   Potassium 4.7 3.5 - 5.1 mmol/L   Chloride 99 98 - 111 mmol/L   CO2 30 22 - 32 mmol/L   Glucose, Bld 131 (H) 70 - 99 mg/dL   BUN 40 (H) 6 - 20 mg/dL   Creatinine, Ser 0.86 (H) 0.61 - 1.24 mg/dL   Calcium  8.0 (L) 8.9 - 10.3 mg/dL   GFR, Estimated 50 (L) >60 mL/min   Anion gap 5 5 - 15  Glucose, capillary   Collection Time: 07/29/23  8:01 AM  Result Value Ref Range   Glucose-Capillary 145 (H) 70 - 99 mg/dL  Glucose, capillary   Collection Time: 07/29/23 12:34 PM  Result Value Ref Range   Glucose-Capillary 150 (H) 70 - 99 mg/dL  Glucose, capillary   Collection Time: 07/29/23  5:16 PM  Result Value Ref Range   Glucose-Capillary 160 (H) 70 - 99 mg/dL  CBC   Collection Time: 07/30/23  5:43 AM  Result Value Ref Range   WBC 11.4 (H) 4.0 - 10.5 K/uL   RBC 2.43 (L) 4.22 - 5.81 MIL/uL   Hemoglobin 7.1 (L) 13.0 - 17.0 g/dL   HCT 57.8 (L) 46.9 - 62.9 %   MCV 89.7 80.0 - 100.0 fL   MCH 29.2 26.0 - 34.0 pg   MCHC 32.6 30.0 - 36.0 g/dL    RDW 52.8 41.3 - 24.4 %   Platelets 442 (H) 150 - 400 K/uL   nRBC 0.0 0.0 - 0.2 %  Magnesium    Collection Time: 07/30/23  5:43 AM  Result Value Ref Range   Magnesium  2.0 1.7 - 2.4 mg/dL  Basic metabolic panel with GFR   Collection Time: 07/30/23  5:43 AM  Result Value Ref Range   Sodium 133 (L) 135 - 145 mmol/L   Potassium 4.5 3.5 - 5.1 mmol/L   Chloride 102 98 - 111 mmol/L   CO2 27 22 - 32 mmol/L   Glucose, Bld 145 (H) 70 - 99 mg/dL   BUN 47 (H) 6 - 20 mg/dL   Creatinine, Ser 0.10 (H) 0.61 - 1.24 mg/dL   Calcium  7.9 (L) 8.9 - 10.3 mg/dL   GFR, Estimated >27 >25 mL/min   Anion gap 4 (  L) 5 - 15  Phosphorus   Collection Time: 07/30/23  5:43 AM  Result Value Ref Range   Phosphorus 3.6 2.5 - 4.6 mg/dL  Glucose, capillary   Collection Time: 07/30/23  7:32 AM  Result Value Ref Range   Glucose-Capillary 152 (H) 70 - 99 mg/dL    Discharge Medications:   Allergies as of 07/30/2023       Reactions   Metformin  And Related Other (See Comments)   Severe diarrhea        Medication List     TAKE these medications    diclofenac Sodium 1 % Gel Commonly known as: VOLTAREN Apply 4 g topically as needed (Bilateral leg and hand pain).   Mounjaro  15 MG/0.5ML Pen Generic drug: tirzepatide  Inject 15 mg into the skin once a week.   naproxen sodium 220 MG tablet Commonly known as: ALEVE Take 440 mg by mouth as needed.   rosuvastatin  20 MG tablet Commonly known as: Crestor  Take 1 tablet (20 mg total) by mouth daily. What changed: when to take this   valsartan  40 MG tablet Commonly known as: DIOVAN  Take 1 tablet (40 mg total) by mouth daily. What changed: when to take this        Diagnostic Studies: CT EXTREMITY LOWER LEFT W CONTRAST Result Date: 07/22/2023 CLINICAL DATA:  Soft tissue infection suspected, lower leg, xray done EXAM: CT OF THE LOWER LEFT EXTREMITY WITH CONTRAST TECHNIQUE: Multidetector CT imaging of the lower left extremity was performed according to the  standard protocol following intravenous contrast administration. RADIATION DOSE REDUCTION: This exam was performed according to the departmental dose-optimization program which includes automated exposure control, adjustment of the mA and/or kV according to patient size and/or use of iterative reconstruction technique. CONTRAST:  75mL OMNIPAQUE IOHEXOL 350 MG/ML SOLN COMPARISON:  None Available. FINDINGS: Bones/Joint/Cartilage No acute bony abnormality. Specifically, no fracture, subluxation, or dislocation. No bone destruction seen to suggest osteomyelitis. 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 Subchondral cystic change throughout the midfoot and hindfoot. Ligaments Suboptimally assessed by CT. Muscles and Tendons Unremarkable. Soft tissues Gas is noted throughout the soft tissues in the left foot and extends proximally throughout the soft tissues of the left calf. This also extends along the fascial planes in the region of the knee posteriorly and within the fascial planes adjacent to the hamstring muscles up into the proximal thigh. Diffuse calcifications throughout the superficial femoral artery and popliteal artery, but the vessels are patent. Trifurcation vessels are diseased but patent into the distal calf. IMPRESSION: Extensive soft tissue gas throughout the left leg and foot. No acute bony abnormality.  No fracture or bone destruction. Electronically Signed   By: Janeece Mechanic M.D.   On: 07/22/2023 20:32   DG Chest Port 1 View Result Date: 07/22/2023 CLINICAL DATA:  Sepsis EXAM: PORTABLE CHEST 1 VIEW COMPARISON:  Chest radiograph dated 12/19/2008 FINDINGS: Normal lung volumes. No focal consolidations. No pleural effusion or pneumothorax. The heart size and mediastinal contours are within normal limits. No acute osseous abnormality. IMPRESSION: No acute disease. Electronically Signed   By: Limin  Xu M.D.   On: 07/22/2023 20:15   DG Foot 2 Views Left Result Date:  07/22/2023 CLINICAL DATA:  Left foot wound EXAM: LEFT FOOT - 2 VIEW COMPARISON:  None Available. FINDINGS: There is no evidence of fracture or dislocation. Extensive subcutaneous emphysema involving the mid and hindfoot extending into the partially imaged distal lower leg. Charcot joint of the hindfoot. IMPRESSION: 1. Extensive subcutaneous emphysema  involving the mid and hindfoot extending into the partially imaged distal lower leg. 2. Charcot joint of the hindfoot. Electronically Signed   By: Limin  Xu M.D.   On: 07/22/2023 20:13    Disposition: Discharge disposition: 02-Transferred to [facility]       Discharge Instructions     Call MD / Call 911   Complete by: As directed    If you experience chest pain or shortness of breath, CALL 911 and be transported to the hospital emergency room.  If you develope a fever above 101 F, pus (white drainage) or increased drainage or redness at the wound, or calf pain, call your surgeon's office.   Constipation Prevention   Complete by: As directed    Drink plenty of fluids.  Prune juice may be helpful.  You may use a stool softener, such as Colace (over the counter) 100 mg twice a day.  Use MiraLax  (over the counter) for constipation as needed.   Diet - low sodium heart healthy   Complete by: As directed    Increase activity slowly as tolerated   Complete by: As directed    Negative Pressure Wound Therapy - Incisional   Complete by: As directed    Post-operative opioid taper instructions:   Complete by: As directed    POST-OPERATIVE OPIOID TAPER INSTRUCTIONS: It is important to wean off of your opioid medication as soon as possible. If you do not need pain medication after your surgery it is ok to stop day one. Opioids include: Codeine, Hydrocodone (Norco, Vicodin), Oxycodone (Percocet, oxycontin ) and hydromorphone  amongst others.  Holik term and even short term use of opiods can cause: Increased pain  response Dependence Constipation Depression Respiratory depression And more.  Withdrawal symptoms can include Flu like symptoms Nausea, vomiting And more Techniques to manage these symptoms Hydrate well Eat regular healthy meals Stay active Use relaxation techniques(deep breathing, meditating, yoga) Do Not substitute Alcohol to help with tapering If you have been on opioids for less than two weeks and do not have pain than it is ok to stop all together.  Plan to wean off of opioids This plan should start within one week post op of your joint replacement. Maintain the same interval or time between taking each dose and first decrease the dose.  Cut the total daily intake of opioids by one tablet each day Next start to increase the time between doses. The last dose that should be eliminated is the evening dose.           Follow-up Information     Timothy Ford, MD Follow up in 1 week(s).   Specialty: Orthopedic Surgery Contact information: [address] [place]  [place] [address] [phone]                  Signed: Timothy Ford 07/30/2023, 9:47 AM

## 2023-07-31 LAB — CBC WITH DIFFERENTIAL/PLATELET
Abs Immature Granulocytes: 0.12 10*3/uL — ABNORMAL HIGH (ref 0.00–0.07)
Basophils Absolute: 0.1 10*3/uL (ref 0.0–0.1)
Basophils Relative: 1 %
Eosinophils Absolute: 0.4 10*3/uL (ref 0.0–0.5)
Eosinophils Relative: 3 %
HCT: 22 % — ABNORMAL LOW (ref 39.0–52.0)
Hemoglobin: 7.3 g/dL — ABNORMAL LOW (ref 13.0–17.0)
Immature Granulocytes: 1 %
Lymphocytes Relative: 21 %
Lymphs Abs: 2.5 10*3/uL (ref 0.7–4.0)
MCH: 30 pg (ref 26.0–34.0)
MCHC: 33.2 g/dL (ref 30.0–36.0)
MCV: 90.5 fL (ref 80.0–100.0)
Monocytes Absolute: 1.1 10*3/uL — ABNORMAL HIGH (ref 0.1–1.0)
Monocytes Relative: 9 %
Neutro Abs: 8 10*3/uL — ABNORMAL HIGH (ref 1.7–7.7)
Neutrophils Relative %: 65 %
Platelets: 439 10*3/uL — ABNORMAL HIGH (ref 150–400)
RBC: 2.43 MIL/uL — ABNORMAL LOW (ref 4.22–5.81)
RDW: 13.9 % (ref 11.5–15.5)
WBC: 12.3 10*3/uL — ABNORMAL HIGH (ref 4.0–10.5)
nRBC: 0 % (ref 0.0–0.2)

## 2023-07-31 LAB — COMPREHENSIVE METABOLIC PANEL WITH GFR
ALT: 35 U/L (ref 0–44)
AST: 34 U/L (ref 15–41)
Albumin: 2.1 g/dL — ABNORMAL LOW (ref 3.5–5.0)
Alkaline Phosphatase: 85 U/L (ref 38–126)
Anion gap: 6 (ref 5–15)
BUN: 49 mg/dL — ABNORMAL HIGH (ref 6–20)
CO2: 24 mmol/L (ref 22–32)
Calcium: 8.2 mg/dL — ABNORMAL LOW (ref 8.9–10.3)
Chloride: 103 mmol/L (ref 98–111)
Creatinine, Ser: 1.26 mg/dL — ABNORMAL HIGH (ref 0.61–1.24)
GFR, Estimated: >60 mL/min (ref >60)
Glucose, Bld: 127 mg/dL — ABNORMAL HIGH (ref 70–99)
Potassium: 4.6 mmol/L (ref 3.5–5.1)
Sodium: 133 mmol/L — ABNORMAL LOW (ref 135–145)
Total Bilirubin: 0.3 mg/dL (ref 0.0–1.2)
Total Protein: 6.3 g/dL — ABNORMAL LOW (ref 6.5–8.1)

## 2023-07-31 LAB — GLUCOSE, CAPILLARY
Glucose-Capillary: 125 mg/dL — ABNORMAL HIGH (ref 70–99)
Glucose-Capillary: 209 mg/dL — ABNORMAL HIGH (ref 70–99)
Glucose-Capillary: 167 mg/dL — ABNORMAL HIGH (ref 70–99)
Glucose-Capillary: 126 mg/dL — ABNORMAL HIGH (ref 70–99)

## 2023-07-31 MED ORDER — VITAMIN D 25 MCG (1000 UNIT) PO TABS
1000.0000 [IU] | ORAL_TABLET | Freq: Every day | ORAL | Status: DC
  Administered 2023-07-31 – 2023-08-13 (×14): 1000 [IU] via ORAL
  Filled 2023-07-31 (×14): qty 1

## 2023-07-31 MED ORDER — INSULIN GLARGINE-YFGN 100 UNIT/ML ~~LOC~~ SOLN
6.0000 [IU] | SUBCUTANEOUS | Status: DC
  Administered 2023-07-31 – 2023-08-04 (×5): 6 [IU] via SUBCUTANEOUS
  Filled 2023-07-31 (×6): qty 0.06

## 2023-07-31 MED ORDER — ADULT MULTIVITAMIN W/MINERALS CH
1.0000 | ORAL_TABLET | Freq: Every day | ORAL | Status: DC
  Administered 2023-07-31 – 2023-08-13 (×14): 1 via ORAL
  Filled 2023-07-31 (×14): qty 1

## 2023-07-31 MED ORDER — PROSOURCE PLUS PO LIQD
30.0000 mL | Freq: Two times a day (BID) | ORAL | Status: DC
  Administered 2023-08-01 – 2023-08-13 (×25): 30 mL via ORAL
  Filled 2023-07-31 (×25): qty 30

## 2023-07-31 MED ORDER — VITAMIN B-12 1000 MCG PO TABS
1000.0000 ug | ORAL_TABLET | Freq: Every day | ORAL | Status: DC
  Administered 2023-07-31 – 2023-08-13 (×14): 1000 ug via ORAL
  Filled 2023-07-31 (×14): qty 1

## 2023-07-31 NOTE — Plan of Care (Signed)
  Problem: RH Balance Goal: LTG Patient will maintain dynamic standing with ADLs (OT) Description: LTG:  Patient will maintain dynamic standing balance with assist during activities of daily living (OT)  Flowsheets (Taken 07/31/2023 1714) LTG: Pt will maintain dynamic standing balance during ADLs with: Supervision/Verbal cueing   Problem: RH Dressing Goal: LTG Patient will perform lower body dressing w/assist (OT) Description: LTG: Patient will perform lower body dressing with assist, with/without cues in positioning using equipment (OT) Flowsheets (Taken 07/31/2023 1714) LTG: Pt will perform lower body dressing with assistance level of: Independent with assistive device   Problem: RH Toileting Goal: LTG Patient will perform toileting task (3/3 steps) with assistance level (OT) Description: LTG: Patient will perform toileting task (3/3 steps) with assistance level (OT)  Flowsheets (Taken 07/31/2023 1714) LTG: Pt will perform toileting task (3/3 steps) with assistance level: Independent with assistive device   Problem: RH Tub/Shower Transfers Goal: LTG Patient will perform tub/shower transfers w/assist (OT) Description: LTG: Patient will perform tub/shower transfers with assist, with/without cues using equipment (OT) Flowsheets (Taken 07/31/2023 1714) LTG: Pt will perform tub/shower stall transfers with assistance level of: Supervision/Verbal cueing

## 2023-07-31 NOTE — Progress Notes (Addendum)
 Initial Nutrition Assessment  DOCUMENTATION CODES:   Not applicable  INTERVENTION:   Prosource Plus 30 ml PO BID, each packet provides 100 kcal and 15 gm protein. HS snack daily. MVI with minerals daily. Diabetes & wound healing education added to discharge summary.  Continue Juven 1 packet PO BID, each packet provides 80 calories, 8 grams of carbohydrate, 2.5  grams of protein (collagen), 7 grams of L-arginine and 7 grams of L-glutamine; supplement contains CaHMB, Vitamins C, E, B12 and Zinc to promote wound healing. Continue vitamin C 1,000 mg daily to support wound healing. Continue zinc sulfate 220 mg daily x 14 days total to support wound healing. Zinc supplementation for longer than 14 days can cause copper deficiency.  NUTRITION DIAGNOSIS:   Increased nutrient needs related to post-op healing, wound healing as evidenced by estimated needs.  GOAL:   Patient will meet greater than or equal to 90% of their needs  MONITOR:   PO intake, Supplement acceptance, Skin  REASON FOR ASSESSMENT:   Consult Wound healing, Diet education  ASSESSMENT:   58 yo male admitted with functional deficits secondary to L BKA d/t necrotizing fasciitis. PMH includes DM-2, R great toe amputation.  Currently on a carb modified diet. Meal intakes 100%.   Labs reviewed. Na 133  Medications reviewed and include vitamin C, vitamin D3, vitamin B12, novolog , semglee, juven, zinc sulfate.  Weight history reviewed. No significant weight changes. 11/21/22: 90.6 kg 02/18/23: 84.8 kg 05/15/23: 81.6 kg 07/25/23: 84.8 kg  NUTRITION - FOCUSED PHYSICAL EXAM:  Unable to complete  Diet Order:   Diet Order             Diet Carb Modified Fluid consistency: Thin; Room service appropriate? Yes  Diet effective now                   EDUCATION NEEDS:   Education needs have been addressed  Skin:  Skin Assessment: Skin Integrity Issues: Skin Integrity Issues:: Wound VAC Wound Vac: L BKA  site  Last BM:  5/29 type 5  Height:   Ht Readings from Last 1 Encounters:  07/25/23 6' (1.829 m)    Weight:   Wt Readings from Last 1 Encounters:  07/25/23 84.8 kg    Ideal Body Weight:  75.7 kg   Estimated Nutritional Needs:   Kcal:  2200-2400  Protein:  110-125 gm  Fluid:  2.2-2.4 L   Barnet Boots RD, LDN, CNSC Contact via secure chat. If unavailable, use group chat "RD Inpatient."

## 2023-07-31 NOTE — Discharge Instructions (Addendum)
 Inpatient Rehab Discharge Instructions  Scott Navarro. Discharge date and time: 08/13/2023   Activities/Precautions/ Functional Status: Activity: no lifting, driving, or strenuous exercise for until cleared by MD.  Diet: diabetic diet Wound Care: keep wound clean and dry and follow up with ortho for staple removal Functional status:  ___ No restrictions     ___ Walk up steps independently ___ 24/7 supervision/assistance   ___ Walk up steps with assistance ___ Intermittent supervision/assistance  ___ Bathe/dress independently ___ Walk with walker     ___ Bathe/dress with assistance ___ Walk Independently    ___ Shower independently ___ Walk with assistance    ___ Shower with assistance __X_ No alcohol     ___ Return to work/school ________  Special Instructions:    My questions have been answered and I understand these instructions. I will adhere to these goals and the provided educational materials after my discharge from the hospital.  Patient/Caregiver Signature _______________________________ Date __________  Clinician Signature _______________________________________ Date __________  Please bring this form and your medication list with you to all your follow-up doctor's appointments.      Carbohydrate Counting For People With Diabetes  Foods with carbohydrates make your blood glucose level go up. Learning how to count carbohydrates can help you control your blood glucose levels. First, identify the foods you eat that contain carbohydrates. Then, using the Foods with Carbohydrates chart, determine about how much carbohydrates are in your meals and snacks. Make sure you are eating foods with fiber, protein, and healthy fat along with your carbohydrate foods. Foods with Carbohydrates The following table shows carbohydrate foods that have about 15 grams of carbohydrate each. Using measuring cups, spoons, or a food scale when you first begin learning about carbohydrate counting  can help you learn about the portion sizes you typically eat. The following foods have 15 grams carbohydrate each:  Grains 1 slice bread (1 ounce)  1 small tortilla (6-inch size)   large bagel (1 ounce)  1/3 cup pasta or rice (cooked)   hamburger or hot dog bun ( ounce)   cup cooked cereal   to  cup ready-to-eat cereal  2 taco shells (5-inch size) Fruit 1 small fresh fruit ( to 1 cup)   medium banana  17 small grapes (3 ounces)  1 cup melon or berries   cup canned or frozen fruit  2 tablespoons dried fruit (blueberries, cherries, cranberries, raisins)   cup unsweetened fruit juice  Starchy Vegetables  cup cooked beans, peas, corn, potatoes/sweet potatoes   large baked potato (3 ounces)  1 cup acorn or butternut squash  Snack Foods 3 to 6 crackers  8 potato chips or 13 tortilla chips ( ounce to 1 ounce)  3 cups popped popcorn  Dairy 3/4 cup (6 ounces) nonfat plain yogurt, or yogurt with sugar-free sweetener  1 cup milk  1 cup plain rice, soy, coconut or flavored almond milk Sweets and Desserts  cup ice cream or frozen yogurt  1 tablespoon jam, jelly, pancake syrup, table sugar, or honey  2 tablespoons light pancake syrup  1 inch square of frosted cake or 2 inch square of unfrosted cake  2 small cookies (2/3 ounce each) or  large cookie  Sometimes you'll have to estimate carbohydrate amounts if you don't know the exact recipe. One cup of mixed foods like soups can have 1 to 2 carbohydrate servings, while some casseroles might have 2 or more servings of carbohydrate. Foods that have less than 20 calories in  each serving can be counted as "free" foods. Count 1 cup raw vegetables, or  cup cooked non-starchy vegetables as "free" foods. If you eat 3 or more servings at one meal, then count them as 1 carbohydrate serving.  Foods without Carbohydrates  Not all foods contain carbohydrates. Meat, some dairy, fats, non-starchy vegetables, and many beverages don't contain  carbohydrate. So when you count carbohydrates, you can generally exclude chicken, pork, beef, fish, seafood, eggs, tofu, cheese, butter, sour cream, avocado, nuts, seeds, olives, mayonnaise, water, black coffee, unsweetened tea, and zero-calorie drinks. Vegetables with no or low carbohydrate include green beans, cauliflower, tomatoes, and onions. How much carbohydrate should I eat at each meal?  Carbohydrate counting can help you plan your meals and manage your weight. Following are some starting points for carbohydrate intake at each meal. Work with your registered dietitian nutritionist to find the best range that works for your blood glucose and weight.   To Lose Weight To Maintain Weight  Women 2 - 3 carb servings 3 - 4 carb servings  Men 3 - 4 carb servings 4 - 5 carb servings  Checking your blood glucose after meals will help you know if you need to adjust the timing, type, or number of carbohydrate servings in your meal plan. Achieve and keep a healthy body weight by balancing your food intake and physical activity.  Tips How should I plan my meals?  Plan for half the food on your plate to include non-starchy vegetables, like salad greens, broccoli, or carrots. Try to eat 3 to 5 servings of non-starchy vegetables every day. Have a protein food at each meal. Protein foods include chicken, fish, meat, eggs, or beans (note that beans contain carbohydrate). These two food groups (non-starchy vegetables and proteins) are low in carbohydrate. If you fill up your plate with these foods, you will eat less carbohydrate but still fill up your stomach. Try to limit your carbohydrate portion to  of the plate.  What fats are healthiest to eat?  Diabetes increases risk for heart disease. To help protect your heart, eat more healthy fats, such as olive oil, nuts, and avocado. Eat less saturated fats like butter, cream, and high-fat meats, like bacon and sausage. Avoid trans fats, which are in all foods that  list "partially hydrogenated oil" as an ingredient. What should I drink?  Choose drinks that are not sweetened with sugar. The healthiest choices are water, carbonated or seltzer waters, and tea and coffee without added sugars.  Sweet drinks will make your blood glucose go up very quickly. One serving of soda or energy drink is  cup. It is best to drink these beverages only if your blood glucose is low.  Artificially sweetened, or diet drinks, typically do not increase your blood glucose if they have zero calories in them. Read labels of beverages, as some diet drinks do have carbohydrate and will raise your blood glucose. Label Reading Tips Read Nutrition Facts labels to find out how many grams of carbohydrate are in a food you want to eat. Don't forget: sometimes serving sizes on the label aren't the same as how much food you are going to eat, so you may need to calculate how much carbohydrate is in the food you are serving yourself.   Carbohydrate Counting for People with Diabetes Sample 1-Day Menu  Breakfast  cup yogurt, low fat, low sugar (1 carbohydrate serving)   cup cereal, ready-to-eat, unsweetened (1 carbohydrate serving)  1 cup strawberries (1 carbohydrate serving)  cup almonds ( carbohydrate serving)  Lunch 1, 5 ounce can chunk light tuna  2 ounces cheese, low fat cheddar  6 whole wheat crackers (1 carbohydrate serving)  1 small apple (1 carbohydrate servings)   cup carrots ( carbohydrate serving)   cup snap peas  1 cup 1% milk (1 carbohydrate serving)   Evening Meal Stir fry made with: 3 ounces chicken  1 cup brown rice (3 carbohydrate servings)   cup broccoli ( carbohydrate serving)   cup green beans   cup onions  1 tablespoon olive oil  2 tablespoons teriyaki sauce ( carbohydrate serving)  Evening Snack 1 extra small banana (1 carbohydrate serving)  1 tablespoon peanut butter   Carbohydrate Counting for People with Diabetes Vegan Sample 1-Day Menu   Breakfast 1 cup cooked oatmeal (2 carbohydrate servings)   cup blueberries (1 carbohydrate serving)  2 tablespoons flaxseeds  1 cup soymilk fortified with calcium  and vitamin D   1 cup coffee  Lunch 2 slices whole wheat bread (2 carbohydrate servings)   cup baked tofu   cup lettuce  2 slices tomato  2 slices avocado   cup baby carrots ( carbohydrate serving)  1 orange (1 carbohydrate serving)  1 cup soymilk fortified with calcium  and vitamin D    Evening Meal Burrito made with: 1 6-inch corn tortilla (1 carbohydrate serving)  1 cup refried vegetarian beans (2 carbohydrate servings)   cup chopped tomatoes   cup lettuce   cup salsa  1/3 cup brown rice (1 carbohydrate serving)  1 tablespoon olive oil for rice   cup zucchini   Evening Snack 6 small whole grain crackers (1 carbohydrate serving)  2 apricots ( carbohydrate serving)   cup unsalted peanuts ( carbohydrate serving)    Carbohydrate Counting for People with Diabetes Vegetarian (Lacto-Ovo) Sample 1-Day Menu  Breakfast 1 cup cooked oatmeal (2 carbohydrate servings)   cup blueberries (1 carbohydrate serving)  2 tablespoons flaxseeds  1 egg  1 cup 1% milk (1 carbohydrate serving)  1 cup coffee  Lunch 2 slices whole wheat bread (2 carbohydrate servings)  2 ounces low-fat cheese   cup lettuce  2 slices tomato  2 slices avocado   cup baby carrots ( carbohydrate serving)  1 orange (1 carbohydrate serving)  1 cup unsweetened tea  Evening Meal Burrito made with: 1 6-inch corn tortilla (1 carbohydrate serving)   cup refried vegetarian beans (1 carbohydrate serving)   cup tomatoes   cup lettuce   cup salsa  1/3 cup brown rice (1 carbohydrate serving)  1 tablespoon olive oil for rice   cup zucchini  1 cup 1% milk (1 carbohydrate serving)  Evening Snack 6 small whole grain crackers (1 carbohydrate serving)  2 apricots ( carbohydrate serving)   cup unsalted peanuts ( carbohydrate serving)    Copyright  2020  Academy of [employer]. All rights reserved.  Using Nutrition Labels: Carbohydrate  Serving Size  Look at the serving size. All the information on the label is based on this portion. Servings Per Container  The number of servings contained in the package. Guidelines for Carbohydrate  Look at the total grams of carbohydrate in the serving size.  1 carbohydrate choice = 15 grams of carbohydrate. Range of Carbohydrate Grams Per Choice  Carbohydrate Grams/Choice Carbohydrate Choices  6-10   11-20 1  21-25 1  26-35 2  36-40 2  41-50 3  51-55 3  56-65 4  66-70 4  71-80 5    Copyright  2020  Academy [employer] Dietetics. All rights reserved.  Tips For Adding Protein  Patients may be advised to increase the protein in their diet but not necessarily the calories as well. However, note that when adding protein to your diet, you will also be adding extra calories. The following suggestions may help add the extra protein while keeping the calories as low as possible.  Tips Add extra egg to one or more meals  Increase the portion of milk to drink and change to skim milk if able  Include [place] yogurt or cottage cheese for snack or part of a meal  Increase portion size of protein entre and decrease portion of starch/bread  Mix protein powder, nut butter, almond/nut milk, non-fat dry milk, or Greek yogurt to shakes and smoothies  Use these ingredients also in baked goods or other recipes Use double the amount of sandwich filling  Add protein foods to all snacks including cheese, nut butters, milk and yogurt Food Tips for Including Protein  Beans Cook and use dried peas, beans, and tofu in soups or add to casseroles, pastas, and grain dishes that also contain cheese or meat  Mash with cheese and milk  Use tofu to make smoothies  Commercial Protein Supplements Use nutritional supplements or protein powder sold at pharmacies and grocery stores  Use protein  powder in milk drinks and desserts, such as pudding  Mix with ice cream, milk, and fruit or other flavorings for a high-protein milkshake  Cottage Cheese or [employer] Mix with or use to stuff fruits and vegetables  Add to casseroles, spaghetti, noodles, or egg dishes such as omelets, scrambled eggs, and souffls  Use gelatin, pudding-type desserts, cheesecake, and pancake or waffle batter  Use to stuff crepes, pasta shells, or manicotti  Puree and use as a substitute for sour cream  Eggs, Egg whites, and Egg Yolks Add chopped, hard-cooked eggs to salads and dressings, vegetables, casseroles, and creamed meats  Beat eggs into mashed potatoes, vegetable purees, and sauces  Add extra egg whites to quiches, scrambled eggs, custards, puddings, pancake batter, or [place] toast wash/batter  Make a rich custard with egg yolks, double strength milk, and sugar  Add extra hard-cooked yolks to deviled egg filling and sandwich spreads  Hard or Semi-Soft Cheese (Cheddar, Marylou Sobers, Miller) Melt on sandwiches, bread, muffins, tortillas, hamburgers, hot dogs, other meats or fish, vegetables, eggs, or desserts such as stewed figs or pies  Grate and add to soups, sauces, casseroles, vegetable dishes, potatoes, rice noodles, or meatloaf  Serve as a snack with crackers or bagels  Ice cream, Yogurt, and Frozen Yogurt Add to milk drinks such as milkshakes  Add to cereals, fruits, gelatin desserts, and pies  Blend or whip with soft or cooked fruits  Sandwich ice cream or frozen yogurt between enriched cake slices, cookies, or graham crackers  Use seasoned yogurt as a dip for fruits, vegetables, or chips  Use yogurt in place of sour cream in casseroles  Meat and Fish Add chopped, cooked meat or fish to vegetables, salads, casseroles, soups, sauces, and biscuit dough  Use in omelets, souffls, quiches, and sandwich fillings  Add chicken and [place] to stuffing  Wrap in pie crust or biscuit dough as turnovers  Add to  stuffed baked potatoes  Add pureed meat to soups  Milk Use in beverages and in cooking  Use in preparing foods, such as hot cereal, soups, cocoa, or pudding  Add cream sauces to vegetable and other dishes  Use evaporated milk, evaporated skim milk, or sweetened condensed milk instead of milk or water in recipes.  Nonfat Dry Milk Add 1/3 cup of nonfat dry milk powdered milk to each cup of regular milk for "double strength" milk  Add to yogurt and milk drinks, such as pasteurized eggnog and milkshakes  Add to scrambled eggs and mashed potatoes  Use in casseroles, meatloaf, hot cereal, breads, muffins, sauces, cream soups, puddings and custards, and other milk-based desserts  Nuts, Seeds, and Wheat Germ Add to casseroles, breads, muffins, pancakes, cookies, and waffles  Sprinkle on fruit, cereal, ice cream, yogurt, vegetables, salads, and toast as a crunchy topping  Use in place of breadcrumbs  Blend with parsley or spinach, herbs, and cream for a noodle, pasta, or vegetable sauce.  Roll banana in chopped nuts  Peanut Butter Spread on sandwiches, toast, muffins, crackers, waffles, pancakes, and fruit slices  Use as a dip for raw vegetables, such as carrots, cauliflower, and celery  Blend with milk drinks, smoothies, and other beverages  Swirl through soft ice cream or yogurt  Spread on a banana then roll in crushed, dry cereal or chopped nuts   Copyright 2020  Academy [employer]. All rights reserved.

## 2023-07-31 NOTE — Progress Notes (Signed)
 PROGRESS NOTE   Subjective/Complaints: WBC increased slightly, will repeat tomorrow to trend Patient has no complaints this morning Pain is well controlled  ROS: pain is well controlled   Objective:   No results found. Recent Labs    07/30/23 0543 07/31/23 0658  WBC 11.4* 12.3*  HGB 7.1* 7.3*  HCT 21.8* 22.0*  PLT 442* 439*   Recent Labs    07/30/23 0543 07/31/23 0658  NA 133* 133*  K 4.5 4.6  CL 102 103  CO2 27 24  GLUCOSE 145* 127*  BUN 47* 49*  CREATININE 1.32* 1.26*  CALCIUM  7.9* 8.2*    Intake/Output Summary (Last 24 hours) at 07/31/2023 1115 Last data filed at 07/31/2023 0838 Gross per 24 hour  Intake 3456 ml  Output 800 ml  Net 2656 ml        Physical Exam: Vital Signs Blood pressure 123/66, pulse 88, temperature 98.1 F (36.7 C), temperature source Oral, resp. rate 16, SpO2 97%. Gen: no distress, normal appearing HEENT: oral mucosa pink and moist, NCAT Cardiovascular:     Rate and Rhythm: Normal rate and regular rhythm.     Pulses: Normal pulses.     Heart sounds: Murmur heard.  Pulmonary:     Effort: Pulmonary effort is normal. No respiratory distress.     Breath sounds: Normal breath sounds. No wheezing or rhonchi.  Abdominal:     General: Abdomen is protuberant. Bowel sounds are normal. There is no distension.     Palpations: Abdomen is soft.     Tenderness: There is no abdominal tenderness. There is no guarding or rebound.     Comments: LBM: 5/28 soft/normal   Musculoskeletal:        General: Normal range of motion.     Cervical back: Normal range of motion.     Right lower leg: Normal. No swelling. No edema.     Comments: Left BKA-in Vac w/ shrinker. No drainage in canister  Skin:    General: Skin is warm and dry.     Capillary Refill: Capillary refill takes less than 2 seconds.  Neurological:     Mental Status: He is alert and oriented to person, place, and time. Mental  status is at baseline.     Sensory: Sensory deficit (pt reports sensation intact in b/l UE and RLE, With eyes closed he has increased dificulty with lighttouch sensation of his foot) present.     Motor: Weakness and atrophy present.     Comments:  RUE: 5/5 Deltoid, 5/5 Biceps, 5/5 Triceps, 4+/5 Wrist Ext, 4/5 Grip, FA 3/5 LUE: 5/5 Deltoid, 4+/5 Biceps,  4+/5 Triceps, 4+/5 Wrist Ext, 4/5 Grip, FA 2/5 RLE: HF 5/5, KE 5/5, KF 5/5, ADF 3/5, APF 4/5 LLE: HF 5/5, KE  at least 4/5 Atrophy in intrinsic hand muscles   Psychiatric:        Mood and Affect: Mood normal.        Behavior: Behavior normal.        Thought Content: Thought content normal.        Judgment: Judgment normal.    IV Left forearm - looks ok   Assessment/Plan: 1. Functional deficits which require  3+ hours per day of interdisciplinary therapy in a comprehensive inpatient rehab setting. Physiatrist is providing close team supervision and 24 hour management of active medical problems listed below. Physiatrist and rehab team continue to assess barriers to discharge/monitor patient progress toward functional and medical goals  Care Tool:  Bathing              Bathing assist       Upper Body Dressing/Undressing Upper body dressing   What is the patient wearing?: Pull over shirt    Upper body assist Assist Level: Supervision/Verbal cueing    Lower Body Dressing/Undressing Lower body dressing      What is the patient wearing?: Underwear/pull up, Pants     Lower body assist Assist for lower body dressing: Supervision/Verbal cueing     Toileting Toileting    Toileting assist Assist for toileting: Moderate Assistance - Patient 50 - 74%     Transfers Chair/bed transfer  Transfers assist     Chair/bed transfer assist level: Maximal Assistance - Patient 25 - 49% (stand<>pivot)     Locomotion Ambulation   Ambulation assist   Ambulation activity did not occur: Safety/medical concerns (decreased  balance, fatigue, weakness)          Walk 10 feet activity   Assist  Walk 10 feet activity did not occur: Safety/medical concerns (decreased balance, fatigue, weakness)        Walk 50 feet activity   Assist Walk 50 feet with 2 turns activity did not occur: Safety/medical concerns (decreased balance, fatigue, weakness)         Walk 150 feet activity   Assist Walk 150 feet activity did not occur: Safety/medical concerns (decreased balance, fatigue, weakness)         Walk 10 feet on uneven surface  activity   Assist Walk 10 feet on uneven surfaces activity did not occur: Safety/medical concerns (decreased balance, fatigue, weakness)         Wheelchair     Assist Is the patient using a wheelchair?: Yes Type of Wheelchair: Manual    Wheelchair assist level: Supervision/Verbal cueing Max wheelchair distance: 150ft    Wheelchair 50 feet with 2 turns activity    Assist        Assist Level: Supervision/Verbal cueing   Wheelchair 150 feet activity     Assist      Assist Level: Supervision/Verbal cueing   Blood pressure 123/66, pulse 88, temperature 98.1 F (36.7 C), temperature source Oral, resp. rate 16, SpO2 97%.  Medical Problem List and Plan: 1. 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             -patient may not shower             -ELOS/Goals: 12-14 days, PT/OT sup             -Admit to CIR             -Dr Julio Ohm note 5/26 indicated that wound VAC can be removed in about a week  Shrinker and limb guard ordered  2. Impaired mobility: continue Lovenox            3. Pain Management: Continue oxycodone  5-10 mg PRN.              -monitor for breakthrough             -tylenol  PRN  4. Mood/Behavior/Sleep: LCSW to follow for evaluation and support.             -  antipsychotic agents: N/A             -order for Trazadone prn         5. Neuropsych/cognition: This patient is capable of making decisions on his  own behalf. 6. Skin/Wound Care: Maintain  wound VAC for 14 days (08/08/2023).              - Continue zinc  and vitamin C              -Prevalon Boot ordered  7. Fluids/Electrolytes/Nutrition: monitor I/O, check CMET              -encourage fluids(AKI)              -Continue Juven supplements/ Diet: Carb modified  8. TDM2: Hemoglobin A1c 6.7 (stable) - Monitor B/S use SSI for elevated blood sugar--current BS rang 140-160s -Increase Semglee  to 6U -Continue Mounjaro  15 mg subcutaneous injection Friday (nonformulary requested/wife to bring from home). 9. HLD: Crestor  20 mg daily/pm  10.  HTN/Orthostatic hypotension: Monitor BP's TID -Valsartan  being held (RX note) -check orthostatic VS  11. Anemia: hx of anemia/ monitor CBC.  12.  Prolonged Qtc:             - Avoid QTc prolonging medications, monitor electrolytes  13.  AKI:  Cr reviewed and has improved  14.  Distal muscle atrophy and weakness.  Possible neuropathy related.  Consider outpatient EMG/nerve conduction study      LOS: 1 days A FACE TO FACE EVALUATION WAS PERFORMED  Emilly Lavey P Hondo Nanda 07/31/2023, 11:15 AM

## 2023-07-31 NOTE — Evaluation (Signed)
 Physical Therapy Assessment and Plan  Patient Details  Name: Scott Navarro. MRN: [id number] Date of Birth: 05-31-1965  PT Diagnosis: Abnormal posture, Abnormality of gait, Difficulty walking, Impaired sensation, and Muscle weakness Rehab Potential: Good ELOS: 12-14 days   Today's Date: 07/31/2023 PT Individual Time: 0830-0940 PT Individual Time Calculation (min): 70 min    Hospital Problem: Principal Problem:   S/P BKA (below knee amputation), left (HCC) Active Problems:   Necrotizing fasciitis [facility])   Past Medical History:  Past Medical History:  Diagnosis Date   Type II diabetes mellitus (HCC) dx'd 10/08/2016   Past Surgical History:  Past Surgical History:  Procedure Laterality Date   AMPUTATION Right 10/09/2016   Procedure: INCISION AND DRAINAGE RIGHT GREAT TOE;  Surgeon: Timothy Ford, MD;  Location: [facility] OR;  Service: Orthopedics;  Laterality: Right;   AMPUTATION Left 07/23/2023   Procedure: AMPUTATION, BELOW KNEE, LEFT;  Surgeon: Timothy Ford, MD;  Location: [facility] OR;  Service: Orthopedics;  Laterality: Left;  IRRIGATION AND DEBRIDEMENT AND AMPUTATION OF LEFT LEG   IRRIGATION AND DEBRIDEMENT KNEE Left 07/25/2023   Procedure: IRRIGATION AND DEBRIDEMENT LEFT KNEE;  Surgeon: Timothy Ford, MD;  Location: [facility] OR;  Service: Orthopedics;  Laterality: Left;  DEBRIDEMENT LEFT LEG    Assessment & Plan Clinical Impression: Patient is a 58 y.o. year old male with significant PMHX of DM type 2 (recent A1c 6.7), Charcot arthropathy, chronic plantar ulceration, HTN, HLD and anemia.  Presented on 07/22/2023 due to 4-5 days of worsening wound on left foot along with some generalized weakness.  Patient reported small area of discoloration on the left lateral foot area rapidly progressed, now involving the entire foot. CT scan showed extensive soft tissue gas formation throughout the left leg and foot.  Admission labs showed WBC 44, hgb 11, creatinine 2.3, glucose 341, bicarb 23, lactic acid 1.3,  potassium 3.2.  Chest xray unremarkable, EKG-sinus tachycardia.  On 07/23/23 Dr. Julio Ohm performed left transtibial amputation and debridement of necrotizing fasciitis; patient was left open back placed. Pt returned to OR on 07/25/2023 for I&D. Prevena wound VAC secured upon closure. POD #7.  He denies phantom pain. Pain overall controlled with current medications.  He has chronic muscle atrophy in his hands.  Social hx: Retired from [employer], lives at home with wife and son who has extensive medical needs. PTA pt walked with cane intermittently otherwise independent. Therapy evaluations completed due to patient decreased functional mobility was admitted for a comprehensive rehab program.   Patient currently requires max with mobility secondary to muscle weakness, decreased cardiorespiratoy endurance, and decreased standing balance, decreased postural control, decreased balance strategies, and difficulty maintaining precautions.  Prior to hospitalization, patient was modified independent  with mobility and lived with Spouse in a House home.  Home access is  Ramped entrance.  Patient will benefit from skilled PT intervention to maximize safe functional mobility, minimize fall risk, and decrease caregiver burden for planned discharge home with intermittent assist.  Anticipate patient will benefit from follow up [facility] at discharge.  PT - End of Session Activity Tolerance: Tolerates 30+ min activity with multiple rests Endurance Deficit: Yes Endurance Deficit Description: fatigued with minimal activity PT Assessment Rehab Potential (ACUTE/IP ONLY): Good PT Barriers to Discharge: Decreased caregiver support;Wound Care;Weight bearing restrictions;Other (comments) PT Barriers to Discharge Comments: pt/wife are primary caregivers for their son and pt's mother, LLE NWB precautions, weakness/deconditioning PT Patient demonstrates impairments in the following area(s):  Balance;Endurance;Edema;Motor;Nutrition;Perception;Safety;Sensory;Skin Integrity PT Transfers Functional  Problem(s): Bed Mobility;Bed to Chair;Car;Furniture PT Locomotion Functional Problem(s): Ambulation;Wheelchair Mobility;Stairs PT Plan PT Intensity: Minimum of 1-2 x/day ,45 to 90 minutes PT Frequency: 5 out of 7 days PT Duration Estimated Length of Stay: 12-14 days PT Treatment/Interventions: Ambulation/gait training;Discharge planning;Functional mobility training;Psychosocial support;Therapeutic Activities;Visual/perceptual remediation/compensation;Balance/vestibular training;Disease management/prevention;Neuromuscular re-education;Skin care/wound management;Therapeutic Exercise;Wheelchair propulsion/positioning;Cognitive remediation/compensation;DME/adaptive equipment instruction;Pain management;Splinting/orthotics;UE/LE Strength taining/ROM;Community reintegration;Stair training;UE/LE Coordination activities PT Transfers Anticipated Outcome(s): Mod I with LRAD PT Locomotion Anticipated Outcome(s): CGA with LRAD PT Recommendation Follow Up Recommendations: Home health PT Patient destination: Home Equipment Recommended: To be determined Equipment Details: has rollator, WC, and tranpsort chair   PT Evaluation Precautions/Restrictions Precautions Precautions: Fall Recall of Precautions/Restrictions: Intact Precaution/Restrictions Comments: L LE wound vac, L BKA; watch BP - when standing Restrictions Weight Bearing Restrictions Per Provider Order: Yes LLE Weight Bearing Per Provider Order: Non weight bearing Other Position/Activity Restrictions: Limb protector is ordered and will arrive later on 07/31/23. Pain Interference Pain Interference Pain Effect on Sleep: 0. Does not apply - I have not had any pain or hurting in the past 5 days Pain Interference with Therapy Activities: 1. Rarely or not at all Pain Interference with Day-to-Day Activities: 1. Rarely or not at all Home  Living/Prior Functioning Home Living Available Help at Discharge: Family Type of Home: House Home Access: Ramped entrance Home Layout: One level Bathroom Shower/Tub: Engineer, manufacturing systems: Standard Bathroom Accessibility: Yes Additional Comments: Pt/wife are primary caregiver for pt's mother with Alzheimer's and son who is bedbound. Pt's mother in law moved in wit pt's wife to assist while pt is in hopsital.  Lives With: Spouse Prior Function Level of Independence: Requires assistive device for independence  Able to Take Stairs?: Yes Driving: Yes Vocation: Retired Marine scientist Requirements: [employer] Vision/Perception  Vision - History Ability to See in Adequate Light: 0 Adequate Perception Perception: Not tested Praxis Praxis: Not tested  Cognition Overall Cognitive Status: Within Functional Limits for tasks assessed Arousal/Alertness: Awake/alert Orientation Level: Oriented X4 Memory: Appears intact Awareness: Impaired Problem Solving: Impaired Safety/Judgment: Impaired Comments: slightly decreased insight into deficits Sensation Sensation Light Touch: Impaired by gross assessment Hot/Cold: Not tested Proprioception: Impaired by gross assessment Stereognosis: Not tested Additional Comments: decreased sensation from R calf and below. Pt reports tingling sensation along plantar aspect of R foot in standing. Coordination Gross Motor Movements are Fluid and Coordinated: No Fine Motor Movements are Fluid and Coordinated: Yes Coordination and Movement Description: altered balance strategies due to L BKA Finger Nose Finger Test: [facility] Heel Shin Test: unable to perform on LLE due to BKA Motor  Motor Motor: Abnormal postural alignment and control Motor - Skilled Clinical Observations: altered balance strategies due to L BKA  Trunk/Postural Assessment  Cervical Assessment Cervical Assessment: Within Functional Limits Thoracic Assessment Thoracic  Assessment: Within Functional Limits Lumbar Assessment Lumbar Assessment: Within Functional Limits Postural Control Postural Control: Deficits on evaluation Righting Reactions: delayed and inadequate on L due to BKA Protective Responses: delayed and inadequate on L due to BKA  Balance Balance Balance Assessed: Yes Static Sitting Balance Static Sitting - Balance Support: Feet supported;Bilateral upper extremity supported Static Sitting - Level of Assistance: 5: Stand by assistance (supervision) Dynamic Sitting Balance Dynamic Sitting - Balance Support: Feet supported;No upper extremity supported Dynamic Sitting - Level of Assistance: 5: Stand by assistance (CGA) Sitting balance - Comments: can sit EOB unsupported, initial posterior bias but pt self-corrected Static Standing Balance Static Standing - Balance Support: Bilateral upper extremity supported;During functional activity (RW) Static Standing - Level of  Assistance: 4: Min Scientist, research (medical) Standing - Balance Support: Bilateral upper extremity supported;During functional activity (RW) Dynamic Standing - Level of Assistance: 2: Max assist Dynamic Standing - Comments: with stand<>pivot transfer Extremity Assessment  RLE Assessment RLE Assessment: Exceptions to [facility] General Strength Comments: tested sitting EOB RLE Strength Right Hip Flexion: 3+/5 Right Hip ABduction: 4-/5 Right Hip ADduction: 4-/5 Right Knee Flexion: 4-/5 Right Knee Extension: 3+/5 Right Ankle Dorsiflexion: 1/5 Right Ankle Plantar Flexion: 3-/5 LLE Assessment LLE Assessment: Exceptions to [facility] General Strength Comments: tested sitting EOB LLE Strength Left Hip Flexion: 3-/5 Left Hip ABduction: 3+/5 Left Hip ADduction: 3+/5 Left Knee Extension: 3+/5  Care Tool Care Tool Bed Mobility Roll left and right activity   Roll left and right assist level: Supervision/Verbal cueing    Sit to lying activity        Lying to sitting on  side of bed activity   Lying to sitting on side of bed assist level: the ability to move from lying on the back to sitting on the side of the bed with no back support.: Supervision/Verbal cueing     Care Tool Transfers Sit to stand transfer   Sit to stand assist level: Minimal Assistance - Patient > 75%    Chair/bed transfer   Chair/bed transfer assist level: Maximal Assistance - Patient 25 - 49% (stand<>pivot)    Car transfer Car transfer activity did not occur: Safety/medical concerns (decreased balance, fatigue)        Care Tool Locomotion Ambulation Ambulation activity did not occur: Safety/medical concerns (decreased balance, fatigue, weakness)        Walk 10 feet activity Walk 10 feet activity did not occur: Safety/medical concerns (decreased balance, fatigue, weakness)       Walk 50 feet with 2 turns activity Walk 50 feet with 2 turns activity did not occur: Safety/medical concerns (decreased balance, fatigue, weakness)      Walk 150 feet activity Walk 150 feet activity did not occur: Safety/medical concerns (decreased balance, fatigue, weakness)      Walk 10 feet on uneven surfaces activity Walk 10 feet on uneven surfaces activity did not occur: Safety/medical concerns (decreased balance, fatigue, weakness)      Stairs Stair activity did not occur: Safety/medical concerns (decreased balance, fatigue, weakness)        Walk up/down 1 step activity Walk up/down 1 step or curb (drop down) activity did not occur: Safety/medical concerns (decreased balance, fatigue, weakness)      Walk up/down 4 steps activity Walk up/down 4 steps activity did not occur: Safety/medical concerns (decreased balance, fatigue, weakness)      Walk up/down 12 steps activity Walk up/down 12 steps activity did not occur: Safety/medical concerns (decreased balance, fatigue, weakness)      Pick up small objects from floor Pick up small object from the floor (from standing position) activity did  not occur: Safety/medical concerns (decreased balance, fatigue, weakness)      Wheelchair Is the patient using a wheelchair?: Yes Type of Wheelchair: Manual   Wheelchair assist level: Supervision/Verbal cueing Max wheelchair distance: 164ft  Wheel 50 feet with 2 turns activity   Assist Level: Supervision/Verbal cueing  Wheel 150 feet activity   Assist Level: Supervision/Verbal cueing    Refer to Care Plan for [place] Term Goals  SHORT TERM GOAL WEEK 1 PT Short Term Goal 1 (Week 1): pt will transfer bed<>chair with LRAD and CGA PT Short Term Goal 2 (Week 1): pt will transfer sit<>stand with  LRAD and CGA PT Short Term Goal 3 (Week 1): pt will amblate 92ft with LRAD and mod A  Recommendations for other services: None   Skilled Therapeutic Intervention Evaluation completed (see details above and below) with education on PT POC and goals and individual treatment initiated with focus on dressing, functional mobility/transfers, generalized strengthening and endurance, dynamic standing balance/coordination, and WC mobility. Received pt semi-reclined in bed, pt educated on PT evaluation, CIR policies, and therapy schedule and agreeable. Pt denied any pain during session, but hyperverbal, requiring significantly increased time with mobility.  Provided pt with 18x18 WC cushion, L amputee support pad, and requested size S limb guard and 3XL shrinkers when MD arrived for morning rounds. Donned underwear and shorts in supine with supervision and increased time, then transferred supine<>sitting R EOB from flat bed using bedrails with supervision and increased time/effort. Doffed gown and donned pull over shirt with supervision. Donned R sock and shoe with max A. Stood from elevated EOB with RW and min, then performed stand<>pivot into WC with RW and max A due to R knee buckling, requiring assist from therapist to correct LOB. Pt with poor RW safety awareness and impaired sequencing with cues to keep RW over  BOS. Pt required mod A to safely lower hips into WC.   Pt performed WC mobility 167ft using BUE and supervision to main therapy gym with emphasis on BUE strength. Added towels/coband to build up legrest. Returned to room and provided pt with the following limb loss resources: -amputee support group -contracture prevention -shrinker care guidelines -limb protector -balanced amputee brochure Concluded session with pt sitting in WC, needs within reach, and seatbelt alarm on. Safety plan updated.   Mobility Bed Mobility Bed Mobility: Rolling Right;Supine to Sit Rolling Right: Supervision/verbal cueing Supine to Sit: Supervision/Verbal cueing Transfers Transfers: Sit to Stand;Stand Pivot Transfers;Stand to Sit Sit to Stand: Minimal Assistance - Patient > 75% Stand to Sit: Moderate Assistance - Patient 50-74% Stand Pivot Transfers: Maximal Assistance - Patient 25 - 49% Stand Pivot Transfer Details: Verbal cues for sequencing;Verbal cues for technique;Verbal cues for precautions/safety Stand Pivot Transfer Details (indicate cue type and reason): verbal cues for RW safety Transfer (Assistive device): Rolling walker Locomotion  Gait Ambulation: No Gait Gait: No Stairs / Additional Locomotion Stairs: No Wheelchair Mobility Wheelchair Mobility: Yes Wheelchair Assistance: Doctor, general practice: Both upper extremities Wheelchair Parts Management: Needs assistance Distance: 15ft   Discharge Criteria: Patient will be discharged from PT if patient refuses treatment 3 consecutive times without medical reason, if treatment goals not met, if there is a change in medical status, if patient makes no progress towards goals or if patient is discharged from hospital.  The above assessment, treatment plan, treatment alternatives and goals were discussed and mutually agreed upon: by patient  Amarya Kuehl M Zaunegger Koben Daman Zaunegger PT, DPT 07/31/2023, 12:15 PM

## 2023-07-31 NOTE — Progress Notes (Signed)
 Inpatient Rehabilitation  Patient information reviewed and entered into eRehab system by [facility]. Karen Kays., CCC/SLP, PPS Coordinator.  Information including medical coding, functional ability and quality indicators will be reviewed and updated through discharge.

## 2023-07-31 NOTE — Progress Notes (Signed)
 Inpatient Rehabilitation Care Coordinator Assessment and Plan Patient Details  Name: Franciso Dierks. MRN: [id number] Date of Birth: December 07, 1965  Today's Date: 07/31/2023  Hospital Problems: Principal Problem:   S/P BKA (below knee amputation), left [facility]) Active Problems:   Necrotizing fasciitis [facility])  Past Medical History:  Past Medical History:  Diagnosis Date   Type II diabetes mellitus (HCC) dx'd 10/08/2016   Past Surgical History:  Past Surgical History:  Procedure Laterality Date   AMPUTATION Right 10/09/2016   Procedure: INCISION AND DRAINAGE RIGHT GREAT TOE;  Surgeon: Timothy Ford, MD;  Location: [facility] OR;  Service: Orthopedics;  Laterality: Right;   AMPUTATION Left 07/23/2023   Procedure: AMPUTATION, BELOW KNEE, LEFT;  Surgeon: Timothy Ford, MD;  Location: [facility] OR;  Service: Orthopedics;  Laterality: Left;  IRRIGATION AND DEBRIDEMENT AND AMPUTATION OF LEFT LEG   IRRIGATION AND DEBRIDEMENT KNEE Left 07/25/2023   Procedure: IRRIGATION AND DEBRIDEMENT LEFT KNEE;  Surgeon: Timothy Ford, MD;  Location: [facility] OR;  Service: Orthopedics;  Laterality: Left;  DEBRIDEMENT LEFT LEG   Social History:  reports that he has never smoked. He has never used smokeless tobacco. He reports that he does not drink alcohol and does not use drugs.  Family / Support Systems Marital Status: Married How [place]?: 30+ yrs Patient Roles: Spouse Spouse/Significant Other: Tanis Fan (wife) [phone] Children: adult son Carolyn Cisco- had surgery last year and currently bed bound, requiring 24/7 care Other Supports: MIL Anticipated Caregiver: wife Ability/Limitations of Caregiver: Pt needs to be as independent as possible because his wife and her mother provide 24/7 care to their son, and his mother who has Alzhiemer's. Caregiver Availability: Intermittent Family Dynamics: Pt lives with his son, wife, and mother; MIL recently moved in to help care for their son at their son's home.  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 Electronics engineer: Denies   Abuse/Neglect Abuse/Neglect Assessment Can Be Completed: Yes Physical Abuse: Denies Verbal Abuse: Denies Sexual Abuse: Denies Exploitation of patient/patient's resources: Denies Self-Neglect: Denies  Patient response to: Social Isolation - How often do you feel lonely or isolated from those around you?: Never  Emotional Status Pt's affect, behavior and adjustment status: Pt in good spirits at time of visit Recent Psychosocial Issues: Denies Psychiatric History: Denies Substance Abuse History: Denies  Patient / Family Perceptions, Expectations & Goals Pt/Family understanding of illness & functional limitations: Pt and wife have a general understanding of care needs Premorbid pt/family roles/activities: Independent Anticipated changes in roles/activities/participation: Assistance with ADLs/IADLs Pt/family expectations/goals: pt goal is to work on being able to go to the bathroom by himself, and get stronger.  Community Resources [employer]: None Premorbid Home Care/DME Agencies: None Transportation available at discharge: TBD Is the patient able to respond to transportation needs?: Yes In the past 12 months, has lack of transportation kept you from medical appointments or from getting medications?: No In the past 12 months, has lack of transportation kept you from meetings, work, or from getting things needed for daily living?: No Resource referrals recommended: Neuropsychology  Discharge Planning Living Arrangements: Spouse/significant other, Children, Other  relatives Support Systems: Spouse/significant other, Other relatives Type of Residence: Private residence Insurance Resources: Media planner (specify) [facility])  Financial Resources: Other (Comment) (pension) Financial Screen Referred: No Living Expenses: Banker Management: Patient, Spouse Does the patient have any problems obtaining your medications?: No Home Management: Pt and wife manage home care needs Patient/Family Preliminary Plans: TBD Care Coordinator Barriers to Discharge: Decreased caregiver support, Lack of/limited family support, Insurance for SNF coverage, Wound Care, Other (comments) Care Coordinator Barriers to Discharge Comments: has wound vac and waiting for this to be removed. Care Coordinator Anticipated Follow Up Needs: HH/OP  Clinical Impression SW met with pt in room to introduce self, explain role, and discuss discharge process. Pt is not a Cytogeneticist. No HCPOA. DME- w/c, scooter, and shower chair.   Angelise Petrich A Adriana Quinby 07/31/2023, 2:53 PM

## 2023-07-31 NOTE — Progress Notes (Signed)
 Orthopedic Tech Progress Note Patient Details:  Scott Navarro 07-27-1965 [id number] Called iin order to [facility] Patient ID: Scott Life., male   DOB: 1965-12-24, 58 y.o.   MRN: [id number]  Scott Navarro 07/31/2023, 9:31 AM

## 2023-07-31 NOTE — Progress Notes (Signed)
 Physical Therapy Session Note  Patient Details  Name: Scott Navarro. MRN: [id number] Date of Birth: 1965/04/21  Today's Date: 07/31/2023 PT Individual Time: 1415-1455 PT Individual Time Calculation (min): 40 min   Short Term Goals: Week 1:  PT Short Term Goal 1 (Week 1): pt will transfer bed<>chair with LRAD and CGA PT Short Term Goal 2 (Week 1): pt will transfer sit<>stand with LRAD and CGA PT Short Term Goal 3 (Week 1): pt will amblate 82ft with LRAD and mod A  Skilled Therapeutic Interventions/Progress Updates:   Received pt sitting in WC, pt agreeable to PT treatment, and denied any pain during session. Session with emphasis on functional mobility/transfers, generalized strengthening and endurance, dynamic standing balance/coordination, and simulated car transfers. Pt transported to/from room in [facility] dependently for time management purposes.   Pt performed simulated car transfer via stand<>pivot with min A with cues to avoid pulling on mobile doorframe. Pt then performed WC mobility 59ft x 2 trials on uneven surfaces (ramp), trial 1 ascending forwards using BUE and trial 2 ascending backwards using BUE and R foot with min A to overcome threshold, otherwise supervision.   Pt then performed the following seated exercises with emphasis on UE/LE/core strength/ROM: -R hip flexion 2x15 with 2.5lb ankle weight -R knee extension 2x15 with 2.5lb ankle weight -WC pushups 2x5 -shoulder extensions into physioball 2x10 with 5 second hold Returned to room and pt requested to return to bed. Transferred WC<>bed via lateral scoot with min A and sit<>supine with supervision. Concluded session with pt supine in bed, needs within reach, and bed alarm on.   Therapy Documentation Precautions:  Precautions Precautions: Fall Precaution/Restrictions Comments: L LE wound vac, L BKA; watch BP Restrictions Weight Bearing Restrictions Per Provider Order: Yes LLE Weight Bearing Per Provider Order: Non weight  bearing  Therapy/Group: Individual Therapy [name] Nena Bank PT, DPT 07/31/2023, 7:18 AM

## 2023-08-01 ENCOUNTER — Telehealth: Payer: Self-pay

## 2023-08-01 LAB — OCCULT BLOOD X 1 CARD TO LAB, STOOL: Fecal Occult Bld: NEGATIVE

## 2023-08-01 LAB — CBC WITH DIFFERENTIAL/PLATELET
Abs Immature Granulocytes: 0.09 10*3/uL — ABNORMAL HIGH (ref 0.00–0.07)
Basophils Absolute: 0.1 10*3/uL (ref 0.0–0.1)
Basophils Relative: 1 %
Eosinophils Absolute: 0.4 10*3/uL (ref 0.0–0.5)
Eosinophils Relative: 3 %
HCT: 21.1 % — ABNORMAL LOW (ref 39.0–52.0)
Hemoglobin: 6.9 g/dL — CL (ref 13.0–17.0)
Immature Granulocytes: 1 %
Lymphocytes Relative: 23 %
Lymphs Abs: 2.5 10*3/uL (ref 0.7–4.0)
MCH: 29.4 pg (ref 26.0–34.0)
MCHC: 32.7 g/dL (ref 30.0–36.0)
MCV: 89.8 fL (ref 80.0–100.0)
Monocytes Absolute: 1.1 10*3/uL — ABNORMAL HIGH (ref 0.1–1.0)
Monocytes Relative: 10 %
Neutro Abs: 7.1 10*3/uL (ref 1.7–7.7)
Neutrophils Relative %: 62 %
Platelets: 420 10*3/uL — ABNORMAL HIGH (ref 150–400)
RBC: 2.35 MIL/uL — ABNORMAL LOW (ref 4.22–5.81)
RDW: 14.1 % (ref 11.5–15.5)
WBC: 11.2 10*3/uL — ABNORMAL HIGH (ref 4.0–10.5)
nRBC: 0 % (ref 0.0–0.2)

## 2023-08-01 LAB — PREPARE RBC (CROSSMATCH)

## 2023-08-01 LAB — GLUCOSE, CAPILLARY
Glucose-Capillary: 105 mg/dL — ABNORMAL HIGH (ref 70–99)
Glucose-Capillary: 140 mg/dL — ABNORMAL HIGH (ref 70–99)
Glucose-Capillary: 188 mg/dL — ABNORMAL HIGH (ref 70–99)
Glucose-Capillary: 180 mg/dL — ABNORMAL HIGH (ref 70–99)

## 2023-08-01 MED ORDER — ACETAMINOPHEN 325 MG PO TABS
650.0000 mg | ORAL_TABLET | Freq: Once | ORAL | Status: AC
  Administered 2023-08-01: 650 mg via ORAL
  Filled 2023-08-01: qty 2

## 2023-08-01 MED ORDER — DIPHENHYDRAMINE HCL 25 MG PO CAPS
25.0000 mg | ORAL_CAPSULE | Freq: Once | ORAL | Status: AC
  Administered 2023-08-01: 25 mg via ORAL
  Filled 2023-08-01: qty 1

## 2023-08-01 MED ORDER — SODIUM CHLORIDE 0.9% IV SOLUTION: Freq: Once | INTRAVENOUS | Status: DC

## 2023-08-01 NOTE — Progress Notes (Signed)
 Physical Therapy Session Note  Patient Details  Name: Scott Navarro. MRN: [id number] Date of Birth: 07-Jul-1965  Today's Date: 08/01/2023 PT Individual Time: 1015-1105 PT Individual Time Calculation (min): 50 min   Short Term Goals: Week 1:  PT Short Term Goal 1 (Week 1): pt will transfer bed<>chair with LRAD and CGA PT Short Term Goal 2 (Week 1): pt will transfer sit<>stand with LRAD and CGA PT Short Term Goal 3 (Week 1): pt will amblate 62ft with LRAD and mod A  Skilled Therapeutic Interventions/Progress Updates: Pt presented in bathroom with NT present agreeable to therapy. Observed pt complete stand pivot transfer with wall rail to w/c with CGA. Once out of bathroom pt transported to main gym for energy conservation and time management. PTA notified by OT that w/c  was malfunctioning therefore PTA obtained new chair. Pt performed Sit to stand from w/c with minA and cues for hand placement and completed ambulatory transfer with RW and minA to new w/c. Pt propelled w/c ~156ft with supervision and pt indicating notable improvement. Pt returned to main gym and aligned with mat. Provided education on w/c parts management with pt indicating some vision deficits therefore PTA placed coban on brakes and handles for leg rests to allow for easier accessiblity. Pt was able to lock/unlock and release leg rests with supervision and increased time. PTA then noted increased drainage from wound vac with effor. Nsg notifed and due to bright red drainage requested return to room. Pt was able to propel ~169ft before requiring PTA transport remaining distance due to fatigue. In room pt was able to adequately unlock leg rests and remove. Pt completed squat pivot transfer to bed with supervision and sit to supine with supervision. Pt left resting in bed with call bell within reach and nsg present.      Therapy Documentation Precautions:  Precautions Precautions: Fall Recall of Precautions/Restrictions:  Intact Precaution/Restrictions Comments: L LE wound vac, L BKA; watch BP - when standing Restrictions Weight Bearing Restrictions Per Provider Order: No LLE Weight Bearing Per Provider Order: Non weight bearing Other Position/Activity Restrictions: Limb protector is ordered and will arrive later on 07/31/23. General: PT Amount of Missed Time (min): 10 Minutes PT Missed Treatment Reason: Nursing care;Wound care Vital Signs: Therapy Vitals Temp: 98.2 F (36.8 C) Temp Source: Oral Pulse Rate: 95 Resp: 18 BP: (!) 115/59 Patient Position (if appropriate): Lying Oxygen Therapy SpO2: 100 % O2 Device: Room Air Pain: Pain Assessment Pain Scale: 0-10 Pain Score: 0-No pain Pain Type: Surgical pain;Acute pain Pain Location: Other (Comment) Pain Descriptors / Indicators: Aching Pain Onset: On-going Pain Intervention(s): Medication (See eMAR)  Therapy/Group: Individual Therapy  Laurina Fischl 08/01/2023, 4:22 PM

## 2023-08-01 NOTE — Progress Notes (Signed)
 Date and time results received: 08/01/23   Test: Hemoglobin Critical Value: 6.9 Provider Notified: Lamon Pillow, PA  Orders Received? Or Actions Taken?: Orders Received - See Orders for details  Starlyn Economy, RN

## 2023-08-01 NOTE — Care Management (Signed)
 [facility]patient [facility] Individual Statement of Services  Patient Name:  Scott Navarro.  Date:  08/01/2023  Welcome to the [facility]patient [facility].  Our goal is to provide you with an individualized program based on your diagnosis and situation, designed to meet your specific needs.  With this comprehensive rehabilitation program, you will be expected to participate in at least 3 hours of rehabilitation therapies Monday-Friday, with modified therapy programming on the weekends.  Your rehabilitation program will include the following services:  Physical Therapy (PT), Occupational Therapy (OT), 24 hour per day rehabilitation nursing, Therapeutic Recreaction (TR), Psychology, Neuropsychology, Care Coordinator, Rehabilitation Medicine, Nutrition Services, Pharmacy Services, and Other  Weekly team conferences will be held on Wednesdays to discuss your progress.  Your Inpatient Rehabilitation Care Coordinator will talk with you frequently to get your input and to update you on team discussions.  Team conferences with you and your family in attendance may also be held.  Expected length of stay: 12-14 days    Overall anticipated outcome: Independent with Assistive Device  Depending on your progress and recovery, your program may change. Your Inpatient Rehabilitation Care Coordinator will coordinate services and will keep you informed of any changes. Your Inpatient Rehabilitation Care Coordinator's name and contact numbers are listed  below.  The following services may also be recommended but are not provided by the Inpatient Rehabilitation Center:  Driving Evaluations Home Health Rehabiltiation Services Outpatient Rehabilitation Services Vocational Rehabilitation   Arrangements will be made to provide these services after discharge if needed.  Arrangements include referral to agencies that provide these services.  Your insurance has been verified to be:  [facility]  Your primary  doctor is:  Nelda Balsam  Pertinent information will be shared with your doctor and your insurance company.  Inpatient Rehabilitation Care Coordinator:  Kathey Pang [phone] or ([phone]  Information discussed with and copy given to patient by: Rennis Case, 08/01/2023, 1:39 PM

## 2023-08-01 NOTE — Progress Notes (Signed)
 Pt noted with significant bleeding that has leaked through vac dressing and Shrinker sock into limb guard. Also noted accumulation of blood underneath dressing. Pam PA and Cody Das, NP made aware and in room to assess. Contacted Dr. Julio Ohm office and awaiting on a call back.   Starlyn Economy, RN

## 2023-08-01 NOTE — Progress Notes (Addendum)
 PROGRESS NOTE   Subjective/Complaints: Hgb 6.9, appreciate PA consenting for transfusion WBC trending down He has no new complaints  ROS: pain is well controlled, moving bowels regularly   Objective:   No results found. Recent Labs    07/31/23 0658 08/01/23 0645  WBC 12.3* 11.2*  HGB 7.3* 6.9*  HCT 22.0* 21.1*  PLT 439* 420*   Recent Labs    07/30/23 0543 07/31/23 0658  NA 133* 133*  K 4.5 4.6  CL 102 103  CO2 27 24  GLUCOSE 145* 127*  BUN 47* 49*  CREATININE 1.32* 1.26*  CALCIUM  7.9* 8.2*    Intake/Output Summary (Last 24 hours) at 08/01/2023 0934 Last data filed at 08/01/2023 0442 Gross per 24 hour  Intake 960 ml  Output 0 ml  Net 960 ml        Physical Exam: Vital Signs Blood pressure (!) 109/57, pulse 92, temperature 98.5 F (36.9 C), temperature source Oral, resp. rate 18, SpO2 99%. Gen: no distress, normal appearing HEENT: oral mucosa pink and moist, NCAT Cardiovascular:     Rate and Rhythm: Normal rate and regular rhythm.     Pulses: Normal pulses.     Heart sounds: Murmur heard.  Pulmonary:     Effort: Pulmonary effort is normal. No respiratory distress.     Breath sounds: Normal breath sounds. No wheezing or rhonchi.  Abdominal:     General: Abdomen is protuberant. Bowel sounds are normal. There is no distension.     Palpations: Abdomen is soft.     Tenderness: There is no abdominal tenderness. There is no guarding or rebound.     Comments: LBM: 5/28 soft/normal   Musculoskeletal:        General: Normal range of motion.     Cervical back: Normal range of motion.     Right lower leg: Normal. No swelling. No edema.     Comments: Left BKA-in Vac w/ shrinker. No drainage in canister  Skin:    General: Skin is warm and dry.     Capillary Refill: Capillary refill takes less than 2 seconds.  Neurological:     Mental Status: He is alert and oriented to person, place, and time. Mental  status is at baseline.     Sensory: Sensory deficit (pt reports sensation intact in b/l UE and RLE, With eyes closed he has increased dificulty with lighttouch sensation of his foot) present.     Motor: Weakness and atrophy present.     Comments:  RUE: 5/5 Deltoid, 5/5 Biceps, 5/5 Triceps, 4+/5 Wrist Ext, 4/5 Grip, FA 3/5 LUE: 5/5 Deltoid, 4+/5 Biceps,  4+/5 Triceps, 4+/5 Wrist Ext, 4/5 Grip, FA 2/5 RLE: HF 5/5, KE 5/5, KF 5/5, ADF 3/5, APF 4/5 LLE: HF 5/5, KE  at least 4/5 Atrophy in intrinsic hand muscles, stable 5/30   Psychiatric:        Mood and Affect: Mood normal.        Behavior: Behavior normal.        Thought Content: Thought content normal.        Judgment: Judgment normal.    IV Left forearm - looks ok   Assessment/Plan: 1. Functional  deficits which require 3+ hours per day of interdisciplinary therapy in a comprehensive inpatient rehab setting. Physiatrist is providing close team supervision and 24 hour management of active medical problems listed below. Physiatrist and rehab team continue to assess barriers to discharge/monitor patient progress toward functional and medical goals  Care Tool:  Bathing    Body parts bathed by patient: Right arm, Left arm, Chest, Abdomen, Front perineal area, Buttocks, Face   Body parts bathed by helper: Left upper leg, Right upper leg, Right lower leg Body parts n/a: Left lower leg   Bathing assist Assist Level: Moderate Assistance - Patient 50 - 74%     Upper Body Dressing/Undressing Upper body dressing   What is the patient wearing?: Pull over shirt    Upper body assist Assist Level: Supervision/Verbal cueing    Lower Body Dressing/Undressing Lower body dressing      What is the patient wearing?: Underwear/pull up, Pants     Lower body assist Assist for lower body dressing: Minimal Assistance - Patient > 75%     Toileting Toileting    Toileting assist Assist for toileting: Minimal Assistance - Patient > 75%      Transfers Chair/bed transfer  Transfers assist     Chair/bed transfer assist level: Minimal Assistance - Patient > 75%     Locomotion Ambulation   Ambulation assist   Ambulation activity did not occur: Safety/medical concerns (decreased balance, fatigue, weakness)          Walk 10 feet activity   Assist  Walk 10 feet activity did not occur: Safety/medical concerns (decreased balance, fatigue, weakness)        Walk 50 feet activity   Assist Walk 50 feet with 2 turns activity did not occur: Safety/medical concerns (decreased balance, fatigue, weakness)         Walk 150 feet activity   Assist Walk 150 feet activity did not occur: Safety/medical concerns (decreased balance, fatigue, weakness)         Walk 10 feet on uneven surface  activity   Assist Walk 10 feet on uneven surfaces activity did not occur: Safety/medical concerns (decreased balance, fatigue, weakness)         Wheelchair     Assist Is the patient using a wheelchair?: Yes Type of Wheelchair: Manual    Wheelchair assist level: Supervision/Verbal cueing Max wheelchair distance: 154ft    Wheelchair 50 feet with 2 turns activity    Assist        Assist Level: Supervision/Verbal cueing   Wheelchair 150 feet activity     Assist      Assist Level: Supervision/Verbal cueing   Blood pressure (!) 109/57, pulse 92, temperature 98.5 F (36.9 C), temperature source Oral, resp. rate 18, SpO2 99%.  Medical Problem List and Plan: 1. 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             -patient may not shower             -ELOS/Goals: 12-14 days, PT/OT sup             -Admit to CIR             -Dr Julio Ohm note 5/26 indicated that wound VAC can be removed in about a week  Shrinker and limb guard ordered  2. Impaired mobility: continue Lovenox             3. Pain Management: continue oxycodone  5-10 mg PRN.              -  monitor for breakthrough              -tylenol  PRN   4. Mood/Behavior/Sleep: LCSW to follow for evaluation and support.             -antipsychotic agents: N/A             -order for Trazadone prn          5. Neuropsych/cognition: This patient is capable of making decisions on his own behalf. 6. Skin/Wound Care: Maintain  wound VAC for 14 days (08/08/2023).              - Continue zinc and vitamin C             -Prevalon Boot ordered   7. Fluids/Electrolytes/Nutrition: monitor I/O, check CMET              -encourage fluids(AKI)              -Continue Juven supplements/ Diet: Carb modified  8. TDM2: Hemoglobin A1c 6.7 (stable) - Monitor B/S use SSI for elevated blood sugar--current BS rang 140-160s -Increase Semglee to 6U -Continue Mounjaro  15 mg subcutaneous injection Friday (nonformulary requested/wife to bring from home). D/c HS ISS  9. HLD: continue Crestor  20 mg daily/pm  10.  HTN/Orthostatic hypotension: Monitor BP's TID -Valsartan  being held (RX note) -check orthostatic VS -reviewed medications and is not on any anti-hypertensives  11. Anemia: hx of anemia/ monitor CBC. Transfuse 1U PRBC on 5/30  12.  Prolonged Qtc:             - Avoid QTc prolonging medications, monitor electrolytes  13.  AKI:  Cr reviewed and has improved  14.  Distal muscle atrophy and weakness.  Possible neuropathy related.  Consider outpatient EMG/nerve conduction study      LOS: 2 days A FACE TO FACE EVALUATION WAS PERFORMED  Keven Pel Caroleann Casler 08/01/2023, 9:34 AM

## 2023-08-01 NOTE — Progress Notes (Signed)
 Patient noted to have significant bleeding through vac dressing and skrinker sock. I spoke with Dr. Julio Ohm. We will remove the wound VAC dressing and start dry dressing changes.

## 2023-08-01 NOTE — Telephone Encounter (Signed)
 Scott Navarro with Answering service called to relay a message.   Brandy NP CIR called regarding patient. States patient is having Excessive bleeding around Wound VAC. Would like a CB.   S/P I & D Left knee- 07/25/23 S/P LEFT BKA-07/23/2023   CB [phone]

## 2023-08-01 NOTE — Progress Notes (Signed)
 Occupational Therapy Session Note  Patient Details  Name: Scott Navarro. MRN: [id number] Date of Birth: January 29, 1966  Today's Date: 08/01/2023 OT Individual Time: 6962-9528 OT Individual Time Calculation (min): 70 min    Short Term Goals: Week 1:  OT Short Term Goal 1 (Week 1): pt will demonstrate improved BUE and core strength while completing stand pivot transfer with Min A utilizing RW. OT Short Term Goal 2 (Week 1): Pt will demonstrate improved safety awareness and problem solving while navigating manual WC within personal room and rehab unit with Min A.  Skilled Therapeutic Interventions/Progress Updates:  Skilled OT intervention completed with focus on limb care and w/c management education, functional transfers. Pt received seated in w/c, agreeable to session. No pain reported.  Education provided on the following per pt's questions: -limb guard wear schedule- that can be worn at night but important to do skin checks and pillow method at base of limb could suffice for night time also -limb guard care including wiping down daily -how to donn/doff limb guard, with modifications provided for lack of FM and dexterity. Pt reports he never acknowledged difficulty with using both hands until staff started pointing out- pt with visible muscular atrophy bilaterally. Pt doffed/donned with min A needed for strap management -Shrinker wear schedule and care with introduction to skin inspection once wound vac removed -Pt inquired use of rollator vs RW for mobility- discussed how RW is more stable for transfers, not just sit > stands vs rollator due to inability to lock that device with movement  Transported dependently in w/c > gym for time. Demo provided on how to donn/doff leg rests, with pt practicing and requiring mod A to do due to low vision and dexterity difficulty with bilateral hands. Discussed arm rest, and w/c positioning in prep for transfer, requiring min/mod A. Able to laterally  scoot CGA > EOM > L side, and transfer back with supervision without cues.   Pt self-propelled in w/c with supervision about 100 ft > room with difficulty with turns. W/c with posterior tilt and low back, anticipate creating greater challenge for self-propulsion. Upcoming therapist notified for trial at different chair for increased independence and efficiency.  Back in room, PA present discussing plans for wound vac to transition > small device and remain on for 7 more days. OT assisted team with reviewing pt's chart with recent surgeon note highlighting that wound vac could be removed 7 days post op (6/2), therefore new plan to now remove 6/2 per medical team.  Pt remained seated in w/c with direct care handoff to PA present at end of session with all needs met.   Therapy Documentation Precautions:  Precautions Precautions: Fall Recall of Precautions/Restrictions: Intact Precaution/Restrictions Comments: L LE wound vac, L BKA; watch BP - when standing Restrictions Weight Bearing Restrictions Per Provider Order: Yes LLE Weight Bearing Per Provider Order: Non weight bearing Other Position/Activity Restrictions: Limb protector is ordered and will arrive later on 07/31/23.    Therapy/Group: Individual Therapy  Ruthanna Covert, MS, OTR/L  08/01/2023, 10:32 AM

## 2023-08-01 NOTE — Progress Notes (Signed)
 Patient take home med Mounjaro  every Friday night for DM2. Patient was instructed/educated by day shift RN for the patient's wife to bring in medication from home. This writer instructed patient to notify when the wife arrived with the medication, so it can be documented and taken down to pharmacy. This Clinical research associate was called into the room by patient and was notified that patient's wife had already administered the Mounjaro  15mg  inj subcutaneous already and that his right arm CGM had been exchanged as well. This Clinical research associate collected the injection, and notified the pharmacy as well as [place], [place] of the medication administration. Care ongoing.

## 2023-08-01 NOTE — Progress Notes (Signed)
 Occupational Therapy Note  Patient Details  Name: Scott Navarro. MRN: [id number] Date of Birth: September 12, 1965  Today's Date: 08/01/2023 OT Missed Time: 60 Minutes Missed Time Reason: Other (comment)  Pt unable to participate in scheduled OT group d/t medical concerns. Pt bleeding through wound vac dressing and shrinker into limb guard. Currently with PA and nursing to determine next course of action. Will make up OT minutes as able at a later time.    Carollee Circle, OTR/L,CBIS  Supplemental OT - [facility] and [facility] Secure Chat Preferred   08/01/2023, 2:49 PM

## 2023-08-02 LAB — CBC
HCT: 19 % — ABNORMAL LOW (ref 39.0–52.0)
HCT: 24.3 % — ABNORMAL LOW (ref 39.0–52.0)
Hemoglobin: 6.7 g/dL — CL (ref 13.0–17.0)
Hemoglobin: 8.4 g/dL — ABNORMAL LOW (ref 13.0–17.0)
MCH: 31.8 pg (ref 26.0–34.0)
MCH: 30.7 pg (ref 26.0–34.0)
MCHC: 35.3 g/dL (ref 30.0–36.0)
MCHC: 34.6 g/dL (ref 30.0–36.0)
MCV: 90 fL (ref 80.0–100.0)
MCV: 88.7 fL (ref 80.0–100.0)
Platelets: 367 10*3/uL (ref 150–400)
Platelets: 339 10*3/uL (ref 150–400)
RBC: 2.11 MIL/uL — ABNORMAL LOW (ref 4.22–5.81)
RBC: 2.74 MIL/uL — ABNORMAL LOW (ref 4.22–5.81)
RDW: 13.9 % (ref 11.5–15.5)
RDW: 13.8 % (ref 11.5–15.5)
WBC: 16.7 10*3/uL — ABNORMAL HIGH (ref 4.0–10.5)
WBC: 13 10*3/uL — ABNORMAL HIGH (ref 4.0–10.5)
nRBC: 0 % (ref 0.0–0.2)
nRBC: 0 % (ref 0.0–0.2)

## 2023-08-02 LAB — GLUCOSE, CAPILLARY
Glucose-Capillary: 117 mg/dL — ABNORMAL HIGH (ref 70–99)
Glucose-Capillary: 122 mg/dL — ABNORMAL HIGH (ref 70–99)
Glucose-Capillary: 118 mg/dL — ABNORMAL HIGH (ref 70–99)
Glucose-Capillary: 116 mg/dL — ABNORMAL HIGH (ref 70–99)

## 2023-08-02 LAB — PREPARE RBC (CROSSMATCH)

## 2023-08-02 LAB — OCCULT BLOOD X 1 CARD TO LAB, STOOL: Fecal Occult Bld: NEGATIVE

## 2023-08-02 MED ORDER — LOPERAMIDE HCL 2 MG PO CAPS
2.0000 mg | ORAL_CAPSULE | ORAL | Status: DC | PRN
  Administered 2023-08-02 – 2023-08-10 (×3): 2 mg via ORAL
  Filled 2023-08-02 (×3): qty 1

## 2023-08-02 MED ORDER — SODIUM CHLORIDE 0.9% IV SOLUTION: Freq: Once | INTRAVENOUS | Status: AC

## 2023-08-02 NOTE — IPOC Note (Signed)
 Overall Plan of Care (IPOC) Patient Details Name: Scott Navarro. MRN: [id number] DOB: 22-Oct-1965  Admitting Diagnosis: S/P BKA (below knee amputation), left [facility])  Hospital Problems: Principal Problem:   S/P BKA (below knee amputation), left (HCC) Active Problems:   Necrotizing fasciitis (HCC)     Functional Problem List: Nursing Edema, Endurance, Medication Management, Nutrition, Pain, Safety, Perception, Skin Integrity  PT Balance, Endurance, Edema, Motor, Nutrition, Perception, Safety, Sensory, Skin Integrity  OT Balance, Cognition, Endurance, Safety  SLP    TR         Basic ADL's: OT Bathing, Dressing, Toileting     Advanced  ADL's: OT       Transfers: PT Bed Mobility, Bed to Chair, Car, Lobbyist, Technical brewer: PT Ambulation, Psychologist, prison and probation services, Stairs     Additional Impairments: OT None  SLP        TR      Anticipated Outcomes Item Anticipated Outcome  Self Feeding    Swallowing      Basic self-care  Mod I  Toileting  SBA   Bathroom Transfers SBA  Bowel/Bladder  continent of bowel/bladder  Transfers  Mod I with LRAD  Locomotion  CGA with LRAD  Communication     Cognition     Pain  <4 w/ prns  Safety/Judgment  manage safety with sypervision assistance   Therapy Plan: PT Intensity: Minimum of 1-2 x/day ,45 to 90 minutes PT Frequency: 5 out of 7 days PT Duration Estimated Length of Stay: 12-14 days OT Intensity: Minimum of 1-2 x/day, 45 to 90 minutes OT Frequency: 5 out of 7 days OT Duration/Estimated Length of Stay: 12-14 days     Team Interventions: Nursing Interventions Patient/Family Education, Skin Care/Wound Management, Bladder Management, Bowel Management, Disease Management/Prevention, Pain Management, Medication Management, Discharge Planning  PT interventions Ambulation/gait training, Discharge planning, Functional mobility training, Psychosocial support, Therapeutic Activities, Visual/perceptual  remediation/compensation, Balance/vestibular training, Disease management/prevention, Neuromuscular re-education, Skin care/wound management, Therapeutic Exercise, Wheelchair propulsion/positioning, Cognitive remediation/compensation, DME/adaptive equipment instruction, Pain management, Splinting/orthotics, UE/LE Strength taining/ROM, Firefighter, Museum/gallery curator, UE/LE Coordination activities  OT Interventions Warden/ranger, Discharge planning, Community reintegration, Disease mangement/prevention, Fish farm manager, Functional mobility training, Pain management, Patient/family education, Self Care/advanced ADL retraining, Skin care/wound managment, Therapeutic Activities, Therapeutic Exercise, UE/LE Strength taining/ROM, UE/LE Coordination activities, Wheelchair propulsion/positioning  SLP Interventions    TR Interventions    SW/CM Interventions Discharge Planning, Psychosocial Support, Patient/Family Education   Barriers to Discharge MD  Medical stability, Home enviroment access/loayout, Wound care, Lack of/limited family support, Insurance for SNF coverage, and Weight bearing restrictions  Nursing Decreased caregiver support, Home environment access/layout, Wound Care, Weight bearing restrictions Discharge: House  Discharge Home Layout: One level, Other (Comment)  Discharge Home Access: Ramped entranc  PT Decreased caregiver support, Wound Care, Weight bearing restrictions, Other (comments) pt/wife are primary caregivers for their son and pt's mother, LLE NWB precautions, weakness/deconditioning  OT Lack of/limited family support, Weight bearing restrictions, Other (comments) recent L BKA  SLP      SW Decreased caregiver support, Lack of/limited family support, Insurance for SNF coverage, Wound Care, Other (comments) has wound vac and waiting for this to be removed.   Team Discharge Planning: Destination: PT-Home ,OT- Home , SLP-  Projected  Follow-up: PT-Home health PT, OT-  Home health OT, SLP-  Projected Equipment Needs: PT-To be determined, OT- Tub/shower bench, To be determined, SLP-  Equipment Details: PT-has rollator, WC, and tranpsort chair, OT-Owns: manual WC,  Rolator, SPC, hand held shower held, shower chair Patient/family involved in discharge planning: PT- Patient,  OT-Patient, SLP-   MD ELOS: 12-14 days Medical Rehab Prognosis:  Good Assessment: The patient has been admitted for CIR therapies with the diagnosis of BKA. The team will be addressing functional mobility, strength, stamina, balance, safety, adaptive techniques and equipment, self-care, bowel and bladder mgt, patient and caregiver education. Goals have been set at supervision PT/OT. Anticipated discharge destination is home.       See Team Conference Notes for weekly updates to the plan of care

## 2023-08-02 NOTE — Progress Notes (Signed)
 PROGRESS NOTE   Subjective/Complaints:  Yesterday, wound VAC removed due to lots of leaking, starting dry dressing changes per Dr. Julio Ohm.  Nursing documents 500 cc of bloody output yesterday.  Vital stable overnight. P.o. intakes appropriate, 80 to 100%.  Reports patient has been having ongoing diarrhea since 1 AM.  Per documentation, type VI and type III stools.  All continent.  Per a.m. labs, was not responsive to blood transfusion overnight, hemoglobin from 6.9-6.7.  WBC is up from 11.2-16.7.  Platelets normalizing.  FOBT -5-30  Sugars are stable  ROS: Negative except as otherwise stated above   Objective:   No results found. Recent Labs    08/01/23 0645 08/02/23 0719  WBC 11.2* 16.7*  HGB 6.9* 6.7*  HCT 21.1* 19.0*  PLT 420* 367   Recent Labs    07/31/23 0658  NA 133*  K 4.6  CL 103  CO2 24  GLUCOSE 127*  BUN 49*  CREATININE 1.26*  CALCIUM  8.2*    Intake/Output Summary (Last 24 hours) at 08/02/2023 0954 Last data filed at 08/01/2023 1920 Gross per 24 hour  Intake 940 ml  Output 300 ml  Net 640 ml        Physical Exam: Vital Signs Blood pressure 112/65, pulse 95, temperature 98.4 F (36.9 C), temperature source Oral, resp. rate 18, height 6' (1.829 m), SpO2 99%. Gen: no distress, normal appearing HEENT: oral mucosa pink and moist, NCAT Cardiovascular:     Rate and Rhythm: Normal rate and regular rhythm.     Pulses: Normal pulses.     Heart sounds: Murmur heard.   Pulmonary:     Effort: Pulmonary effort is normal. No respiratory distress.     Breath sounds: Normal breath sounds. No wheezing or rhonchi.  Abdominal:     General: Abdomen is protuberant. Bowel sounds are normal. There is no distension.     Palpations: Abdomen is soft.     Tenderness: There is no abdominal tenderness. There is no guarding or rebound.   Musculoskeletal:        General: Normal range of motion.     Cervical back:  Normal range of motion.     Right lower leg: Normal. No swelling. No edema.     Comments: Left BKA-wrapped under dry dressing--full active range of motion in knee extension Skin: Small bruise on left BKA and lateral side as below; otherwise skin well-approximated and sutures intact.  Mild amount of serosanguineous drainage.    Neurological:     Mental Status: He is alert and oriented to person, place, and time. Mental status is at baseline.     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        Behavior: Behavior normal.        Thought Content: Thought content normal.        Judgment: Judgment normal.     Assessment/Plan: 1. Functional deficits which require 3+ hours per day of interdisciplinary therapy in a comprehensive inpatient rehab setting. Physiatrist is providing close team supervision and 24 hour management of active medical problems listed below. Physiatrist and rehab team continue to assess barriers to discharge/monitor patient progress toward functional and medical goals  Care Tool:  Bathing    Body parts bathed by patient: Right arm, Left arm, Chest, Abdomen, Front perineal area, Buttocks, Face   Body parts bathed by helper: Left upper leg, Right upper leg, Right lower leg Body parts n/a: Left lower leg   Bathing assist Assist Level: Moderate Assistance - Patient 50 - 74%     Upper Body Dressing/Undressing Upper body dressing   What is the patient wearing?: Pull over shirt    Upper body assist Assist Level: Supervision/Verbal cueing     Lower Body Dressing/Undressing Lower body dressing      What is the patient wearing?: Underwear/pull up, Pants     Lower body assist Assist for lower body dressing: Minimal Assistance - Patient > 75%     Toileting Toileting    Toileting assist Assist for toileting: Minimal Assistance - Patient > 75%     Transfers Chair/bed transfer  Transfers assist     Chair/bed transfer assist level: Minimal Assistance - Patient > 75%     Locomotion Ambulation   Ambulation assist   Ambulation activity did not occur: Safety/medical concerns (decreased balance, fatigue, weakness)          Walk 10 feet activity   Assist  Walk 10 feet activity did not occur: Safety/medical concerns (decreased balance, fatigue, weakness)        Walk 50 feet activity   Assist Walk 50 feet with 2 turns activity did not occur: Safety/medical concerns (decreased balance, fatigue, weakness)         Walk 150 feet activity   Assist Walk 150 feet activity did not occur: Safety/medical concerns (decreased balance, fatigue, weakness)         Walk 10 feet on uneven surface  activity   Assist Walk 10 feet on uneven surfaces activity did not occur: Safety/medical concerns (decreased balance, fatigue, weakness)         Wheelchair     Assist Is the patient using a wheelchair?: Yes Type of Wheelchair: Manual    Wheelchair assist level: Supervision/Verbal cueing Max wheelchair distance: 166ft    Wheelchair 50 feet with 2 turns activity    Assist        Assist Level: Supervision/Verbal cueing   Wheelchair 150 feet activity     Assist      Assist Level: Supervision/Verbal cueing   Blood pressure 112/65, pulse 95, temperature 98.4 F (36.9 C), temperature source Oral, resp. rate 18, height 6' (1.829 m), SpO2 99%.  Medical Problem List and Plan: 1. 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             -patient may not shower              -ELOS/Goals: 12-14 days, PT/OT sup             -Admit to CIR             -Dr Julio Ohm note 5/26 indicated  that wound VAC can be removed in about a week  Shrinker and limb guard ordered  - 5-30: large, bloody output compromising wound VAC seal; spoke with Dr. Julio Ohm, wound VAC replaced with dry dressings.  2. Impaired mobility: continue Lovenox             3. Pain Management: continue oxycodone  5-10 mg PRN.              -monitor for breakthrough             -tylenol  PRN   4. Mood/Behavior/Sleep: LCSW to follow for evaluation and support.             -antipsychotic agents: N/A             -order for Trazadone prn          5. Neuropsych/cognition: This patient is capable of making decisions on his own behalf. 6. Skin/Wound Care: Maintain  wound VAC for 14 days (08/08/2023).              - Continue zinc  and vitamin C              -Prevalon Boot ordered   - 5-31: Unwrapped wound bed, now with scant serosanguineous drainage.  New photos taken for the chart as above.    7. Fluids/Electrolytes/Nutrition: monitor I/O, check CMET              -encourage fluids(AKI)              -Continue Juven supplements/ Diet: Carb modified    8. TDM2: Hemoglobin A1c 6.7 (stable) - Monitor B/S use SSI for elevated blood sugar--current BS rang 140-160s -Increase Semglee  to 6U -Continue Mounjaro  15 mg subcutaneous injection Friday (nonformulary requested/wife to bring from home). D/c HS ISS  - Blood sugars well-controlled on current regimen Recent Labs    08/02/23 1123 08/02/23 1652 08/02/23 2100  GLUCAP 122* 118* 116*     9. HLD: continue Crestor  20 mg daily/pm  10.  HTN/Orthostatic hypotension: Monitor BP's TID -Valsartan  being held (RX note) -check orthostatic VS -reviewed medications and is not on any anti-hypertensives - Blood pressure well-controlled    08/02/2023    8:31 PM 08/02/2023    7:25 PM 08/02/2023    5:52 PM  Vitals with BMI  Systolic 131 129 161  Diastolic 71 68 67  Pulse 98 97  98    11. Anemia: hx of anemia/ monitor CBC. Transfuse 1U PRBC on 5/30  - 5-31: Hemoglobin 6.7 after 1 unit PRBCs; minimal ongoing blood loss from residual limb since wound VAC removed.  Patient otherwise stable.  Transfused 2 units today with post transfusion labs.  12.  Prolonged Qtc:             - Avoid QTc prolonging medications, monitor electrolytes  13.  AKI:  Cr reviewed and has improved  14.  Distal muscle atrophy and weakness.  Possible neuropathy related.  Consider outpatient EMG/nerve conduction study      LOS: 3 days A FACE TO FACE EVALUATION WAS PERFORMED  Bea Lime 08/02/2023, 9:54 AM

## 2023-08-02 NOTE — Progress Notes (Signed)
 Physical Therapy Session Note  Patient Details  Name: Scott Navarro. MRN: [id number] Date of Birth: 30-Sep-1965  Today's Date: 08/02/2023 PT Individual Time: 5284-1324 and 4010-2725 PT Individual Time Calculation (min): 71 min and 40 min PT Missed Time: 20 minutes PT Missed Time Reason: Nursing care (blood transfusion)  Short Term Goals: Week 1:  PT Short Term Goal 1 (Week 1): pt will transfer bed<>chair with LRAD and CGA PT Short Term Goal 2 (Week 1): pt will transfer sit<>stand with LRAD and CGA PT Short Term Goal 3 (Week 1): pt will amblate 21ft with LRAD and mod A  Skilled Therapeutic Interventions/Progress Updates:   Treatment Session 1 Received pt semi-reclined in bed, pt agreeable to PT treatment, and denied any pain but reporting increased muscle spasms since wound vac was removed yesterday. Pt also reporting feeling weak and having diarrhea today and requesting to remain in room for therapy - MD notified of request for imodium and encouraged hydration. Of note, Hemoglobin low (6.7) and plan for blood transfusion today. Session with emphasis on functional mobility/transfers, generalized strengthening and endurance, toileting, and WC mobility. Pt performed the following exercises with emphasis on LE strength, ROM, and contracture prevention: -supine SLR 2x15 bilaterally -hip abduction 2x15 bilaterally    Pt then reported urgent need to toilet - transferred semi-reclined<>sitting R EOB with HOB elevated and supervision/mod I. Donned R shoe and performed squat<>pivot bed<>WC to L using bedrail with min A. Transferred to/from toilet with bedside commode over top via stand<>pivot using grab bar and min A. Pt required mod A for clothing management via lateral leans, but pt soiled clothing - removed dirty clothes and pt with continued loose stool. Performed hygiene management seated via lateral leans, then donned clean underwear and shorts with max A. Stood from commode with grab bar and min  A and required max A to pull underwear/pants over hips, then returned to [facility] (noted R knee buckling mid-transfer resulting in posterior LOB into WC).   Sat in WC at sink and performed hand hygiene independently, then performed WC mobility ~142ft using BUE and supervision with emphasis on UE strength - limited by "weakness" and requesting to return to room. Performed lateral scoot WC<>bed with CGA and performed the following exercises sitting EOB with emphasis on LE strength, ROM, and contracture prevention:  -seated L knee extension 2x20 -seated hip abduction 2x20 with grn TB -seated R knee flexion 2x15 with grn TB -seated hip flexion 2x15 bilaterally  Transitioned into supine with supervision/mod I and continued with the following exercises: -bicep curls with grn TB 2x20 bilaterally -tricep extensions with grn TB 2x20 bilaterally -modified crunches 2x10 using bedrails for support RN arrived to administer medication and concluded session with pt semi-reclined in bed with all needs within reach. Pt   Treatment Session 2 Received pt semi-reclined in bed with RN at bedside planning to start blood transfusion soon. Pt agreeable to PT treatment but requesting to remain in room again due to loose stools and blood transfusion -  denied any pain during session. Session with emphasis on generalized strengthening and endurance and HEP management.  Pt requested to work on UE exercises first and performed the following exercises with emphasis on UE strength/ROM: -horizontal ER with grn TB 3x15 -rows with grn TB 2x15 -chest press with grn TB 2x20 -tricep extensions with grn TB 3x20 bilaterally -diagonal PNF with grn TB 2x20 bilaterally   Provided pt with UE/LE HEP and educated on frequency/duration/technique for the following exercises: - sidelying  hip and knee AROM 3x10 - sidelying hip abduction 3x10 - supine hip abduction/adduction 3x10 - supine hip flexion 3x10 - single leg bridge 3x10 - supine  hamstring stretch 3x10 with 10 second hold - prone hip extension 3x10 - prone knee flexion 3x10 - sidelying hip adduction 3x10 - supine knee extension 3x10 - half sit ups 3x10 - glute squeezes 2x20 - Supine Chest Press with Resistance  - 1 x daily - 7 x weekly - 3 sets - 15 reps - Supine Shoulder External Rotation with Resistance  - 1 x daily - 7 x weekly - 3 sets - 10 reps - Seated Single Arm Elbow Flexion with Resistance  - 1 x daily - 7 x weekly - 3 sets - 10 reps - Wheelchair Push-Up (AKA)  - 1 x daily - 7 x weekly - 3 sets - 10 reps - Seated Hip Adduction Squeeze with Ball  - 1 x daily - 7 x weekly - 3 sets - 10 reps - 10 hold - Seated Knee Extension (BKA)  - 1 x daily - 7 x weekly - 3 sets - 10 reps - Seated Hip Abduction with Resistance  - 1 x daily - 7 x weekly - 3 sets - 10 reps RN prepping to start blood transfusion. Concluded session with pt semi-reclined in bed, needs within reach, and bed alarm on. 20 minutes missed of skilled physical therapy due to nursing care for blood transfusion.   Therapy Documentation Precautions:  Precautions Precautions: Fall Recall of Precautions/Restrictions: Intact Precaution/Restrictions Comments: L LE wound vac, L BKA; watch BP - when standing Restrictions Weight Bearing Restrictions Per Provider Order: No LLE Weight Bearing Per Provider Order: Non weight bearing Other Position/Activity Restrictions: Limb protector is ordered and will arrive later on 07/31/23.  Therapy/Group: Individual Therapy [name] Nena Bank PT, DPT 08/02/2023, 6:54 AM

## 2023-08-02 NOTE — Progress Notes (Signed)
 Occupational Therapy Session Note  Patient Details  Name: Scott Navarro. MRN: [id number] Date of Birth: 05-13-1965  Today's Date: 08/02/2023 OT Individual Time: 6213-0865 OT Individual Time Calculation (min): 45 min    Short Term Goals: Week 1:  OT Short Term Goal 1 (Week 1): pt will demonstrate improved BUE and core strength while completing stand pivot transfer with Min A utilizing RW. OT Short Term Goal 2 (Week 1): Pt will demonstrate improved safety awareness and problem solving while navigating manual WC within personal room and rehab unit with Min A.  Skilled Therapeutic Interventions/Progress Updates: Patient in bed with HOB supine upon approach.   Patient stated, "I have had quite a bad last day with my leg bleeding out and I have bad diarrhea."  Vital Signs: labs 6.7 hemoglobin.    He concurred to bathe edge of bed and change shirt nand stated, "I feel like I have a meat ball head sitting on a toothpick.   He stated he'd completed pericleansing with nursing due to constant diarrhea and did not need to bathe this am.  -oral care with set up in bed (HOB elevated) - He transferred supine to EOB with mod I - UB bathand dressing = set up EOB;   simulated LB bathing and dressing with lateral leans EOB - was able to pull down shorts and reach behind his buttocks to simulate washing - Patient was able to transfer   - EOB to supine in bed = mod I  Patient was left supine in bed with HOB elevated with call bell in place and bed alarm set  Continue OT POC as patient status allows     Therapy Documentation Precautions:  Precautions Precautions: Fall Recall of Precautions/Restrictions: Intact Precaution/Restrictions Comments: L LE wound vac, L BKA; watch BP - when standing Restrictions Weight Bearing Restrictions Per Provider Order: No LLE Weight Bearing Per Provider Order: Non weight bearing Other Position/Activity Restrictions: Limb protector is ordered and will arrive later on  07/31/23. General: General OT Amount of Missed Time: 15 Minutes Vital Signs: labs 6.7 hemoglobin   Pain: Pain Assessment Pain Scale: 0-10 Pain Score: 0-No pain  Therapy/Group: Individual Therapy  Carrol Clam [facility] 08/02/2023, 11:03 AM

## 2023-08-03 LAB — TYPE AND SCREEN
ABO/RH(D): A POS
Antibody Screen: NEGATIVE
Unit division: 0
Unit division: 0
Unit division: 0

## 2023-08-03 LAB — BPAM RBC
Blood Product Expiration Date: 202506252359
Blood Product Expiration Date: 202506252359
ISSUE DATE / TIME: 202505301510
ISSUE DATE / TIME: 202505311730
ISSUE DATE / TIME: 202506252359
ISSUING PHYSICIAN: 202505311730
PRODUCT CODE: 202505311343
PRODUCT CODE: 202506252359
Unit Type and Rh: 6200
Unit Type and Rh: 202506252359
Unit Type and Rh: 6200
Unit Type and Rh: 6200
Unit Type and Rh: 6200

## 2023-08-03 LAB — GLUCOSE, CAPILLARY
Glucose-Capillary: 103 mg/dL — ABNORMAL HIGH (ref 70–99)
Glucose-Capillary: 110 mg/dL — ABNORMAL HIGH (ref 70–99)
Glucose-Capillary: 145 mg/dL — ABNORMAL HIGH (ref 70–99)
Glucose-Capillary: 136 mg/dL — ABNORMAL HIGH (ref 70–99)

## 2023-08-03 NOTE — Progress Notes (Signed)
 PROGRESS NOTE   Subjective/Complaints: HGB responsive to transfusion yesterday, WBC down. Patient without any pain, states he is doing very well. Sugars are stable Vitals stable  ROS: Negative except as otherwise stated above   Objective:   No results found. Recent Labs    08/02/23 0719 08/02/23 2157  WBC 16.7* 13.0*  HGB 6.7* 8.4*  HCT 19.0* 24.3*  PLT 367 339   No results for input(s): "NA", "K", "CL", "CO2", "GLUCOSE", "BUN", "CREATININE", "CALCIUM " in the last 72 hours.   Intake/Output Summary (Last 24 hours) at 08/03/2023 0954 Last data filed at 08/03/2023 1610 Gross per 24 hour  Intake 1493.87 ml  Output 2100 ml  Net -606.13 ml        Physical Exam: Vital Signs Blood pressure 128/75, pulse 96, temperature 98.3 F (36.8 C), temperature source Oral, resp. rate 18, height 6' (1.829 m), SpO2 98%. Gen: no distress, normal appearing.  Sitting up in bed. HEENT: oral mucosa pink and moist, NCAT Cardiovascular:     Rate and Rhythm: Normal rate and regular rhythm.     Pulses: Normal pulses.     Heart sounds: Murmur heard.   Pulmonary:     Effort: Pulmonary effort is normal. No respiratory distress.     Breath sounds: Normal breath sounds. No wheezing or rhonchi.  Abdominal:     General: Abdomen is protuberant. Bowel sounds are normal. There is no distension.     Palpations: Abdomen is soft.     Tenderness: There is no abdominal tenderness. There is no guarding or rebound.   Musculoskeletal:        General: Normal range of motion.     Cervical back: Normal range of motion.     Right lower leg: Normal. No swelling. No edema.     Comments: Left BKA-wrapped under dry dressing--full active range of motion in knee extension.  Limb guard not being used due to being covered in blood.  Skin: Small bruise on left BKA and lateral side as below; otherwise skin well-approximated and sutures intact.  Mild amount of  serosanguineous drainage.--Dressings from yesterday remain dry 6-1    Neurological:     Mental Status: He is alert and oriented to person, place, and time. Mental status is at baseline.     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        Behavior: Behavior normal.        Thought Content: Thought content normal.        Judgment: Judgment normal.     Assessment/Plan: 1. Functional deficits which require 3+ hours per day of interdisciplinary therapy in a comprehensive inpatient rehab setting. Physiatrist  is providing close team supervision and 24 hour management of active medical problems listed below. Physiatrist and rehab team continue to assess barriers to discharge/monitor patient progress toward functional and medical goals  Care Tool:  Bathing    Body parts bathed by patient: Right arm, Left arm, Chest, Abdomen, Front perineal area, Buttocks, Face   Body parts bathed by helper: Left upper leg, Right upper leg, Right lower leg Body parts n/a: Left lower leg   Bathing assist Assist Level: Moderate Assistance - Patient 50 - 74%     Upper Body Dressing/Undressing Upper body dressing   What is the patient wearing?: Pull over shirt    Upper body assist Assist Level: Supervision/Verbal cueing    Lower Body Dressing/Undressing Lower body dressing      What is the patient wearing?: Underwear/pull up, Pants     Lower body assist Assist for lower body dressing: Minimal Assistance - Patient > 75%     Toileting Toileting    Toileting assist Assist  for toileting: Minimal Assistance - Patient > 75%     Transfers Chair/bed transfer  Transfers assist     Chair/bed transfer assist level: Minimal Assistance - Patient > 75%     Locomotion Ambulation   Ambulation assist   Ambulation activity did not occur: Safety/medical concerns (decreased balance, fatigue, weakness)          Walk 10 feet activity   Assist  Walk 10 feet activity did not occur: Safety/medical concerns (decreased balance, fatigue, weakness)        Walk 50 feet activity   Assist Walk 50 feet with 2 turns activity did not occur: Safety/medical concerns (decreased balance, fatigue, weakness)         Walk 150 feet activity   Assist Walk 150 feet activity did not occur: Safety/medical concerns (decreased balance, fatigue, weakness)         Walk 10 feet on uneven surface  activity   Assist Walk 10 feet on uneven surfaces activity did not occur: Safety/medical concerns (decreased balance, fatigue, weakness)         Wheelchair     Assist Is the patient using a wheelchair?: Yes Type of Wheelchair: Manual    Wheelchair assist level: Supervision/Verbal cueing Max wheelchair distance: 122ft    Wheelchair 50 feet with 2 turns activity    Assist        Assist Level: Supervision/Verbal cueing   Wheelchair 150 feet activity     Assist      Assist Level: Supervision/Verbal cueing   Blood pressure 128/75, pulse 96, temperature 98.3 F (36.8 C), temperature source Oral, resp. rate 18, height 6' (1.829 m), SpO2 98%.  Medical Problem List and Plan: 1. 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             -patient may not shower             -ELOS/Goals: 12-14 days, PT/OT sup             -Admit to CIR             -Dr Julio Ohm note 5/26 indicated that wound VAC can be removed in about a week  Shrinker and limb guard ordered  - 5-30: large, bloody output compromising wound VAC seal; spoke with Dr.  Julio Ohm, wound VAC replaced with dry dressings.  - Will need foam at the end of limb guard replaced due to his being soaked in blood;  Did clean out  current limb guard and showed patient how to use, fitted.  2. Impaired mobility: continue Lovenox             3. Pain Management: continue oxycodone  5-10 mg PRN.              -monitor for breakthrough             -tylenol  PRN   4. Mood/Behavior/Sleep: LCSW to follow for evaluation and support.             -antipsychotic agents: N/A             -order for Trazadone prn          5. Neuropsych/cognition: This patient is capable of making decisions on his own behalf. 6. Skin/Wound Care: Maintain  wound VAC for 14 days (08/08/2023).              - Continue zinc  and vitamin C              -Prevalon Boot ordered   - 5-31: Unwrapped wound bed, now with scant serosanguineous drainage.  New photos taken for the chart as above.    6-1: No drainage on wraps from yesterday; much better, monitor  7. Fluids/Electrolytes/Nutrition: monitor I/O, check CMET              -encourage fluids(AKI)              -Continue Juven supplements/ Diet: Carb modified    8. TDM2: Hemoglobin A1c 6.7 (stable) - Monitor B/S use SSI for elevated blood sugar--current BS rang 140-160s -Increase Semglee  to 6U -Continue Mounjaro  15 mg subcutaneous injection Friday (nonformulary requested/wife to bring from home). D/c HS ISS  - Blood sugars well-controlled on current regimen Recent Labs    08/02/23 1652 08/02/23 2100 08/03/23 0538  GLUCAP 118* 116* 103*     9. HLD: continue Crestor  20 mg daily/pm  10.  HTN/Orthostatic hypotension: Monitor BP's TID -Valsartan  being held (RX note) -check orthostatic VS -reviewed medications and is not on any anti-hypertensives - Blood pressure well-controlled    08/03/2023    3:49 AM 08/02/2023    8:31 PM 08/02/2023    7:25 PM  Vitals with BMI  Systolic 128 131 409  Diastolic 75 71 68  Pulse 96 98 97    11. Anemia: hx of anemia/  monitor CBC. Transfuse 1U PRBC on 5/30  - 5-31: Hemoglobin 6.7 after 1 unit PRBCs; minimal ongoing blood loss from residual limb since wound VAC removed.  Patient otherwise stable.  Transfused 2 units today with post transfusion labs.  6-1:  responded appropriately to transfusion, no further blood loss through dressings, repeat labs this week.  12.  Prolonged Qtc:             - Avoid QTc prolonging medications, monitor electrolytes  13.  AKI:  Cr reviewed and has improved  14.  Distal muscle atrophy and weakness.  Possible neuropathy related.  Consider outpatient EMG/nerve conduction study      LOS: 4 days A FACE TO FACE EVALUATION WAS PERFORMED  Bea Lime 08/03/2023, 9:54 AM

## 2023-08-04 LAB — CBC
HCT: 25 % — ABNORMAL LOW (ref 39.0–52.0)
Hemoglobin: 8.3 g/dL — ABNORMAL LOW (ref 13.0–17.0)
MCH: 30.1 pg (ref 26.0–34.0)
MCHC: 33.2 g/dL (ref 30.0–36.0)
MCV: 90.6 fL (ref 80.0–100.0)
Platelets: 377 10*3/uL (ref 150–400)
RBC: 2.76 MIL/uL — ABNORMAL LOW (ref 4.22–5.81)
RDW: 13.7 % (ref 11.5–15.5)
WBC: 11.1 10*3/uL — ABNORMAL HIGH (ref 4.0–10.5)
nRBC: 0 % (ref 0.0–0.2)

## 2023-08-04 LAB — GLUCOSE, CAPILLARY
Glucose-Capillary: 115 mg/dL — ABNORMAL HIGH (ref 70–99)
Glucose-Capillary: 123 mg/dL — ABNORMAL HIGH (ref 70–99)
Glucose-Capillary: 131 mg/dL — ABNORMAL HIGH (ref 70–99)
Glucose-Capillary: 142 mg/dL — ABNORMAL HIGH (ref 70–99)

## 2023-08-04 LAB — BASIC METABOLIC PANEL WITH GFR
Anion gap: 7 (ref 5–15)
BUN: 39 mg/dL — ABNORMAL HIGH (ref 6–20)
CO2: 28 mmol/L (ref 22–32)
Calcium: 8.1 mg/dL — ABNORMAL LOW (ref 8.9–10.3)
Chloride: 99 mmol/L (ref 98–111)
Creatinine, Ser: 1.16 mg/dL (ref 0.61–1.24)
GFR, Estimated: >60 mL/min (ref >60)
Glucose, Bld: 118 mg/dL — ABNORMAL HIGH (ref 70–99)
Potassium: 3.8 mmol/L (ref 3.5–5.1)
Sodium: 134 mmol/L — ABNORMAL LOW (ref 135–145)

## 2023-08-04 NOTE — Progress Notes (Signed)
 PROGRESS NOTE   Subjective/Complaints: No new complaints this morning Hgb reviewed and is 8.3, discussed with patient, he is feeling more energy and happy to have wound vac off  ROS: pain is well controlled   Objective:   No results found. Recent Labs    08/02/23 2157 08/04/23 0503  WBC 13.0* 11.1*  HGB 8.4* 8.3*  HCT 24.3* 25.0*  PLT 339 377   Recent Labs    08/04/23 0503  NA 134*  K 3.8  CL 99  CO2 28  GLUCOSE 118*  BUN 39*  CREATININE 1.16  CALCIUM  8.1*     Intake/Output Summary (Last 24 hours) at 08/04/2023 1236 Last data filed at 08/04/2023 0906 Gross per 24 hour  Intake 480 ml  Output 1500 ml  Net -1020 ml        Physical Exam: Vital Signs Blood pressure (!) 140/78, pulse 99, temperature 98.5 F (36.9 C), temperature source Oral, resp. rate 18, height 6' (1.829 m), SpO2 98%. Gen: no distress, normal appearing.  Sitting up in bed. HEENT: oral mucosa pink and moist, NCAT Cardiovascular:     Rate and Rhythm: Normal rate and regular rhythm.     Pulses: Normal pulses.     Heart sounds: Murmur heard.   Pulmonary:     Effort: Pulmonary effort is normal. No respiratory distress.     Breath sounds: Normal breath sounds. No wheezing or rhonchi.  Abdominal:     General: Abdomen is protuberant. Bowel sounds are normal. There is no distension.     Palpations: Abdomen is soft.     Tenderness: There is no abdominal tenderness. There is no guarding or rebound.   Musculoskeletal:        General: Normal range of motion.     Cervical back: Normal range of motion.     Right lower leg: Normal. No swelling. No edema.     Comments: Left BKA-wrapped under dry dressing--full active range of motion in knee extension.  Limb guard not being used due to being covered in blood.  Skin: Small bruise on left BKA and lateral side as below; otherwise skin well-approximated and sutures intact.  Mild amount of  serosanguineous drainage, stable 6/2    Neurological:     Mental Status: He is alert and oriented to person, place, and time. Mental status is at baseline.     Sensory: Mild peripheral sensory neuropathy in feet        Comments:  RUE: 5/5 Deltoid, 5/5 Biceps, 5/5 Triceps, 4+/5 Wrist Ext, 4/5 Grip, FA 3/5 LUE: 5/5 Deltoid, 4+/5 Biceps,  4+/5 Triceps, 4+/5 Wrist Ext, 4/5 Grip, FA 2/5 RLE: HF 5/5, KE 5/5, KF 5/5, ADF 3/5, APF 4/5 LLE: HF 5/5, KE  at least 4/5 Atrophy in intrinsic hand muscles, stable 5/30  Physical exam unchanged from the above on reexamination 08/04/23    Psychiatric:        Mood and Affect: Mood normal.        Behavior: Behavior normal.        Thought Content: Thought content normal.        Judgment: Judgment normal.     Assessment/Plan: 1. Functional deficits which  require 3+ hours per day of interdisciplinary therapy in a comprehensive inpatient rehab setting. Physiatrist is providing close team supervision and 24 hour management of active medical problems listed below. Physiatrist and rehab team continue to assess barriers to discharge/monitor patient progress toward functional and medical goals  Care Tool:  Bathing    Body parts bathed by patient: Right arm, Left arm, Chest, Abdomen, Front perineal area, Buttocks, Face   Body parts bathed by helper: Left upper leg, Right upper leg, Right lower leg Body parts n/a: Left lower leg   Bathing assist Assist Level: Moderate Assistance - Patient 50 - 74%     Upper Body Dressing/Undressing Upper body dressing   What is the patient wearing?: Pull over shirt    Upper body assist Assist Level: Supervision/Verbal cueing    Lower Body Dressing/Undressing Lower body dressing      What is the patient wearing?: Underwear/pull up, Pants     Lower body assist Assist for lower body dressing: Minimal Assistance - Patient > 75%     Toileting Toileting    Toileting assist Assist for toileting: Minimal  Assistance - Patient > 75%     Transfers Chair/bed transfer  Transfers assist     Chair/bed transfer assist level: Minimal Assistance - Patient > 75%     Locomotion Ambulation   Ambulation assist   Ambulation activity did not occur: Safety/medical concerns (decreased balance, fatigue, weakness)          Walk 10 feet activity   Assist  Walk 10 feet activity did not occur: Safety/medical concerns (decreased balance, fatigue, weakness)        Walk 50 feet activity   Assist Walk 50 feet with 2 turns activity did not occur: Safety/medical concerns (decreased balance, fatigue, weakness)         Walk 150 feet activity   Assist Walk 150 feet activity did not occur: Safety/medical concerns (decreased balance, fatigue, weakness)         Walk 10 feet on uneven surface  activity   Assist Walk 10 feet on uneven surfaces activity did not occur: Safety/medical concerns (decreased balance, fatigue, weakness)         Wheelchair     Assist Is the patient using a wheelchair?: Yes Type of Wheelchair: Manual    Wheelchair assist level: Supervision/Verbal cueing Max wheelchair distance: 150+    Wheelchair 50 feet with 2 turns activity    Assist        Assist Level: Supervision/Verbal cueing   Wheelchair 150 feet activity     Assist      Assist Level: Supervision/Verbal cueing   Blood pressure (!) 140/78, pulse 99, temperature 98.5 F (36.9 C), temperature source Oral, resp. rate 18, height 6' (1.829 m), SpO2 98%.  Medical Problem List and Plan: 1. 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             -patient may not shower             -ELOS/Goals: 12-14 days, PT/OT sup             -Continue CIR             -Dr Julio Ohm note 5/26 indicated that wound VAC can be removed in about a week  Shrinker and limb guard ordered  Continue dry dressing changes  2. Impaired mobility: continue Lovenox             3. Pain  Management: continue oxycodone  5-10  mg PRN.              -monitor for breakthrough             -tylenol  PRN   4. Mood/Behavior/Sleep: LCSW to follow for evaluation and support.             -antipsychotic agents: N/A             -order for Trazadone prn          5. Neuropsych/cognition: This patient is capable of making decisions on his own behalf. 6. Skin/Wound Care: Maintain  wound VAC for 14 days (08/08/2023).              - Continue zinc  and vitamin C              -Prevalon Boot ordered   7. Fluids/Electrolytes/Nutrition: monitor I/O, check CMET              -encourage fluids(AKI)              -Continue Juven supplements/ Diet: Carb modified    8. TDM2: Hemoglobin A1c 6.7 (stable) - Monitor B/S use SSI for elevated blood sugar--current BS rang 140-160s -Increase Semglee  to 6U -Continue Mounjaro  15 mg subcutaneous injection Friday (nonformulary requested/wife to bring from home). D/c HS ISS, d/c daytime ISS Recent Labs    08/03/23 2032 08/04/23 0524 08/04/23 1118  GLUCAP 136* 115* 123*     9. HLD: continue Crestor  20 mg daily/pm  10.  HTN/Orthostatic hypotension: Monitor BP's TID -Valsartan  being held (RX note) -check orthostatic VS -reviewed medications and is not on any anti-hypertensives - Blood pressure well-controlled    08/04/2023    5:00 AM 08/03/2023    7:19 PM 08/03/2023    1:56 PM  Vitals with BMI  Systolic 140 109 161  Diastolic 78 62 68  Pulse 99 95 96    11. Anemia: discussed that Hgb has improved to 8.3  12.  Prolonged Qtc:             - Avoid QTc prolonging medications, monitor electrolytes  13.  AKI:  Cr reviewed and has improved  14.  Distal muscle atrophy and weakness.  Possible neuropathy related.  Consider outpatient EMG/nerve conduction study      LOS: 5 days A FACE TO FACE EVALUATION WAS PERFORMED  Keven Pel Nielle Duford 08/04/2023, 12:36 PM

## 2023-08-04 NOTE — Discharge Summary (Cosign Needed Addendum)
 Physician Discharge Summary  Patient ID: Scott Navarro. MRN: [id number] DOB/AGE: [birth year] 59 y.o.  Admit date: 07/30/2023 Discharge date: 08/13/2023  Discharge Diagnoses:  Principal Problem:   S/P BKA (below knee amputation), left (HCC) Active Problems:   Necrotizing fasciitis (HCC)   Coping style affecting medical condition Anemia  Diabetic polyneuropathy associated with type 2 diabetes mellitus Hypokalemia Hyperlipidemia Hyponatremia Acute renal failure Hyperglycemia    Discharged Condition: stable  Significant Diagnostic Studies:  CT EXTREMITY LOWER LEFT W CONTRAST Result Date: 07/22/2023 CLINICAL DATA:  Soft tissue infection suspected, lower leg, xray done EXAM: CT OF THE LOWER LEFT EXTREMITY WITH CONTRAST TECHNIQUE: Multidetector CT imaging of the lower left extremity was performed according to the standard protocol following intravenous contrast administration. RADIATION DOSE REDUCTION: This exam was performed according to the departmental dose-optimization program which includes automated exposure control, adjustment of the mA and/or kV according to patient size and/or use of iterative reconstruction technique. CONTRAST:  75mL OMNIPAQUE  IOHEXOL  350 MG/ML SOLN COMPARISON:  None Available. FINDINGS: Bones/Joint/Cartilage No acute bony abnormality. Specifically, no fracture, subluxation, or dislocation. No bone destruction seen to suggest osteomyelitis. 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 Subchondral cystic change throughout the midfoot and hindfoot. Ligaments Suboptimally assessed by CT. Muscles and Tendons Unremarkable. Soft tissues Gas is noted throughout the soft tissues in the left foot and extends proximally throughout the soft tissues of the left calf. This also extends along the fascial planes in the region of the knee posteriorly and within the fascial planes adjacent to the hamstring muscles up into the proximal thigh. Diffuse calcifications  throughout the superficial femoral artery and popliteal artery, but the vessels are patent. Trifurcation vessels are diseased but patent into the distal calf. IMPRESSION: Extensive soft tissue gas throughout the left leg and foot. No acute bony abnormality.  No fracture or bone destruction. Electronically Signed   By: Janeece Mechanic M.D.   On: 07/22/2023 20:32   DG Chest Port 1 View Result Date: 07/22/2023 CLINICAL DATA:  Sepsis EXAM: PORTABLE CHEST 1 VIEW COMPARISON:  Chest radiograph dated 12/19/2008 FINDINGS: Normal lung volumes. No focal consolidations. No pleural effusion or pneumothorax. The heart size and mediastinal contours are within normal limits. No acute osseous abnormality. IMPRESSION: No acute disease. Electronically Signed   By: Limin  Xu M.D.   On: 07/22/2023 20:15   DG Foot 2 Views Left Result Date: 07/22/2023 CLINICAL DATA:  Left foot wound EXAM: LEFT FOOT - 2 VIEW COMPARISON:  None Available. FINDINGS: There is no evidence of fracture or dislocation. Extensive subcutaneous emphysema involving the mid and hindfoot extending into the partially imaged distal lower leg. Charcot joint of the hindfoot. IMPRESSION: 1. Extensive subcutaneous emphysema involving the mid and hindfoot extending into the partially imaged distal lower leg. 2. Charcot joint of the hindfoot. Electronically Signed   By: Limin  Xu M.D.   On: 07/22/2023 20:13     Labs:  Basic Metabolic Panel: Recent Labs  Lab 08/07/23 0528 08/11/23 0525  NA 136 136  K 4.4 4.1  CL 102 102  CO2 29 27  GLUCOSE 103* 106*  BUN 42* 33*  CREATININE 1.20 1.24  CALCIUM  8.1* 8.1*    CBC: Recent Labs  Lab 08/07/23 0528 08/10/23 0734 08/11/23 0525  WBC 9.3  --  9.2  HGB 8.2* 8.4* 8.2*  HCT 24.2* 25.5* 25.0*  MCV 90.0  --  91.6  PLT 374  --  389    CBG: Recent Labs  Lab 08/11/23 2044  08/12/23 0648 08/12/23 1136 08/12/23 1620 08/13/23 0650  GLUCAP 135* 125* 117* 126* 118*    Brief HPI:   Scott Navarro. is a 58  y.o. male right handed male with PMHX of DM type II, charcot arthropathy, chronic plantar ulceration, HTN, HLD, and anemia. Per chart review patient is retired from [employer] and lives at home with his spouse and son in a one level home with ramped entrance. Independent with assistive device prior to admission. Patient presented on 07/22/23 for worsening LLE wound with noted discoloration/discharge and feeling poorly overall noting weakness, shortness of breath, and chest tightness.  CT scan shows extensive gas formation in the entire left lower extremity and foot.  Orthopedic surgery consulted, initial plans were to take the patient to the OR but later decided to continue aggressive antibiotics with Zyvox  and Zosyn . Admission chemistries showed WBC 44, Hgb 11, creatinine 2.3, glucose 341, bicarb 23, lactic acid 1.3, potassium 3.2.  Unremarkable chest x-ray, EKG sinus tachycardia. Subsequently on 07/23/2023 Dr. Julio Ohm performed a transtibial amputation and debridement of necrotizing fasciitis.  Patient returned to the OR on 07/25/23 for further I&D then closed with application of wound VAC.  Therapy evaluations completed due to patients decreased functional mobility and was admitted for a comprehensive rehab program.  Hospital Course: Scott Navarro. was admitted to rehab 07/30/2023 for inpatient therapies to consist of PT, ST and OT at least three hours five days a week. Past admission physiatrist, therapy team and rehab RN have worked together to provide customized collaborative inpatient rehab. LLE BKA wound cultures were negative, leukocytosis monitored for  fluctuating WBC's which improved back to baseline.  Blood pressures were monitored on TID basis and home valsartan  was held due to AKI. BUN/Cr trends were monitored closely . Hyponatremia and Hypokalemia with prolonged QTC corrected after replacing electrolytes, no ischemic changes identified. Serial trop's trended down, plan to follow up in  outpatient setting. Rosuvastatin  ongoing for hyperlipidemia.  Patient chronically anemic with an acute decrease hgb to 6.7.  Dr. Julio Ohm was consulted due to bleeding incision who recc removal of wound vac and d/c of lovenox . , pt received a total of 3 units PRB for acute blood loss anemia with hemoglobins 6.7. He responded appropriately  now 8.2 .  Anemia panel showed mild iron deficiency, iron supplement ordered daily.   Crestor  ongoing for hyperlipidemia. Diabetes has been monitored with ac/hs CBG checks and SSI was used prn for tighter BS control. Pt continued on mounjaro  15 mg. Diabetic teaching was completed.   Rehab course: During patient's stay in rehab weekly team conferences were held to monitor patient's progress, set goals and discuss barriers to discharge. At admission patient required mod assist, +2 physical assistance +2 safety equipment and stand pivot min to mod assist.  He has had improvement in activity tolerance, balance, postural control as well as ability to compensate for deficits. He has had improvement in functional use RUE/LUE  and RLE/LLE as well as improvement in awareness.   OT discussed with patient the instability of a RW and importance of neutral hip alignment to prevent strain and compensatory hip DIP in stance. Patient self-propelled in w/c with mod I assist.  Completed CGA sit> and using RW.  Mod cues and mod A to don 3X shrinker.  Transitions to supine with mod I.  L residual limb strengthening and contracture prevention discussed. Family teaching completed upon discharge to home.      Disposition:  Discharge disposition: 01-Home or  Self Care      Diet: Diabetic   Special Instructions:No drinking alcohol and No Driving.   [facility] to provide rolling walker    Allergies as of 08/12/2023       Reactions   Metformin  And Related Diarrhea        Medication List     STOP taking these medications    diclofenac Sodium 1 % Gel Commonly known as:  VOLTAREN   naproxen sodium 220 MG tablet Commonly known as: ALEVE   valsartan  40 MG tablet Commonly known as: DIOVAN        TAKE these medications    acetaminophen  325 MG tablet Commonly known as: TYLENOL  Take 2 tablets (650 mg total) by mouth every 4 (four) hours as needed for mild pain (pain score 1-3).   ascorbic acid  1000 MG tablet Commonly known as: VITAMIN C  Take 1 tablet (1,000 mg total) by mouth daily.   cyanocobalamin  1000 MCG tablet Take 1 tablet (1,000 mcg total) by mouth daily.   ferrous sulfate 325 (65 FE) MG tablet Take 1 tablet (325 mg total) by mouth daily with breakfast. Start taking on: August 13, 2023   magnesium  gluconate 500 (27 Mg) MG Tabs tablet Commonly known as: MAGONATE Take 1 tablet (500 mg total) by mouth daily.   Mounjaro  15 MG/0.5ML Pen Generic drug: tirzepatide  Inject 15 mg into the skin once a week. Notes to patient: Resume at home.   Oxycodone  HCl 10 MG Tabs--28 tabs given  Take 1 tablet (10 mg total) by mouth every 6 (six) hours as needed for severe pain (pain score 7-10). Notes to patient: Limit to 2 pills daily-As needed for severe pain.    rosuvastatin  20 MG tablet Commonly known as: Crestor  Take 1 tablet (20 mg total) by mouth at bedtime.   senna 8.6 MG Tabs tablet Commonly known as: SENOKOT Take 1 tablet (8.6 mg total) by mouth at bedtime as needed for mild constipation.   vitamin D3 25 MCG tablet Commonly known as: CHOLECALCIFEROL  Take 1 tablet (1,000 Units total) by mouth daily.   Zinc  Sulfate 220 (50 Zn) MG Tabs Take 1 tablet (220 mg total) by mouth daily with supper.        Follow-up Information     Timothy Ford, MD. Schedule an appointment as soon as possible for a visit.   Specialty: Orthopedic Surgery Why: Call for an appointment. Contact information: [address] [place]  [place] [address] [phone]         Tysinger, Christiane Cowing, PA-C. Schedule an appointment as soon as possible for a visit.    Specialty: Family Medicine Why: Call to make appointment Contact information: [address] [place] [address] [phone]         Liam Redhead, MD. Schedule an appointment as soon as possible for a visit.   Specialty: Physical Medicine and Rehabilitation Contact information: [address] [place] [address] [phone]                 Signed: Nasario Badder 08/13/2023, 11:15 AM

## 2023-08-04 NOTE — Progress Notes (Signed)
 Physical Therapy Session Note  Patient Details  Name: Scott Navarro. MRN: [id number] Date of Birth: August 07, 1965  Today's Date: 08/04/2023 PT Individual Time: 1002-1045 PT Individual Time Calculation (min): 43 min   Short Term Goals: Week 1:  PT Short Term Goal 1 (Week 1): pt will transfer bed<>chair with LRAD and CGA PT Short Term Goal 2 (Week 1): pt will transfer sit<>stand with LRAD and CGA PT Short Term Goal 3 (Week 1): pt will amblate 82ft with LRAD and mod A  Skilled Therapeutic Interventions/Progress Updates: Pt presents sitting in w/c and agreeable to therapy.  Pt wheeled to hallway outside of main gym w/ supervision before tiring, PT completed rest of trip to small gym.  Pt performed 6' and 4' on UBE at level 1 for paced program at 50 rpm for total of 471 rpm.  Pt required seated rest break between 2/2 fatigue.Aaron Aas  Pt maintained 50 rpm for 1st 3/4 of each trial.  Pt performed sit to stand w/ min A and performing standing LLE SLR and then hip extension.  Pt requires cues to control momentum as then he c/o RW moving, unsafe.  Pt educated on leg rest don/doffing w/ min A 2/2 B cataracts.  Pt returned to room and performed squat pivot w/c> bed w/ CGA and cues for forward lean for controlled transfer vs "throwing " self into bed.  Pt remained in bed w/ bed alarm on, all rails up per preference and all needs in reach.     Therapy Documentation Precautions:  Precautions Precautions: Fall Recall of Precautions/Restrictions: Intact Precaution/Restrictions Comments: L LE wound vac, L BKA; watch BP - when standing Restrictions Weight Bearing Restrictions Per Provider Order: No LLE Weight Bearing Per Provider Order: Non weight bearing Other Position/Activity Restrictions: Limb protector is ordered and will arrive later on 07/31/23. General:   Vital Signs:   Pain:0/10      Therapy/Group: Individual Therapy  Naliyah Neth P Maicey Barrientez 08/04/2023, 12:28 PM

## 2023-08-04 NOTE — Progress Notes (Signed)
 Occupational Therapy Session Note  Patient Details  Name: Scott Navarro. MRN: [id number] Date of Birth: [birth year]  Today's Date: 08/04/2023 OT Individual Time: 4098-1191 OT Individual Time Calculation (min): 70 min    Short Term Goals: Week 1:  OT Short Term Goal 1 (Week 1): pt will demonstrate improved BUE and core strength while completing stand pivot transfer with Min A utilizing RW. OT Short Term Goal 2 (Week 1): Pt will demonstrate improved safety awareness and problem solving while navigating manual WC within personal room and rehab unit with Min A.  Skilled Therapeutic Interventions/Progress Updates:  Skilled OT intervention completed with focus on DC planning and DME education. Pt received seated in w/c, agreeable to session. No pain reported.  Offered pt a shower due to wound vac removal, however pt declined. Advised pt to wash foam piece for limb guard to prep for future wear due to blood soilage, despite wound vac removal. Encouraged pt to donn limb guard in prep for mobility. Required verbal cues to do so, but overall supervision with time. Mod A needed for donning bilateral leg rests due to low vision/poor BUE dexterity.   Transported dependently in w/c <> ADL bathroom. Pt reports bathroom set up as tub/shower with curtain with previous use of swivel shower chair but would sit on tub ledge > back up to seat and swivel to face shower head at baseline. Discussed use of TTB for increasing safety with transferring in/out of shower and for conserving energy during bathing. Advised use of hand held shower head for ease of bathing. Demonstrated method of tucking shower curtain under buttocks to prevent water spillage in floor. Discussed using lateral leans for peri-washing and use/installation of grab bars for balance. Pt has w/c accessible bathroom. Needed verbal cues for positioning w/c next to TTB, min A for doffing leg rest. Was able to laterally scoot with supervision w/c <> TTB.  Discussed how plan could change with a prosthesis, but recommended drying of BKA prior to donning socket and if possible, waiting to donn socket until after washed and air dried. Pt reports [place] term goal would be to move back home vs at his son's home who requires total A care, and would potentially need transport chair for bathroom entry if not ambulatory at that time. Handout issued for TTB with and without shower curtain cut off.  Discussed toilet accessibility- pt without grab bar at standard toilet. Pt reporting preference of getting DABBSC vs standard for over top of toilet/installation of grab bar. Discussed benefits but need to measure width at toilet for placement over toilet option. Advised pt this would need to be purchased OOP. Handout issued. Home measurement sheet also issued to determine width availability at toilet.  Back in room, pt remained seated in w/c, with chair alarm on/activated, and with all needs in reach at end of session.    Therapy Documentation Precautions:  Precautions Precautions: Fall Recall of Precautions/Restrictions: Intact Precaution/Restrictions Comments: L BKA; watch BP - when standing Restrictions Weight Bearing Restrictions Per Provider Order: No LLE Weight Bearing Per Provider Order: Non weight bearing Other Position/Activity Restrictions: Limb protector is ordered and will arrive later on 07/31/23.    Therapy/Group: Individual Therapy  Ruthanna Covert, MS, OTR/L  08/04/2023, 9:41 AM

## 2023-08-04 NOTE — Progress Notes (Signed)
+/-   sleep. Denies pain. Left BKA ace wrap CD & I. Asking when he's suppose to start wearing the shrinker. RLE with mild pitting edema. Wears prevalon boot at HS. Educated on monitoring right heel and to wear boot in bed after discharge. LBM 06/01. Decreased dexterity to bilateral hands. Reports started getting worse Navarro few months ago. Scott Navarro

## 2023-08-04 NOTE — Progress Notes (Signed)
 Physical Therapy Session Note  Patient Details  Name: Scott Navarro. MRN: [id number] Date of Birth: [birth year]  Today's Date: 08/04/2023 PT Individual Time: 1300-1413 PT Individual Time Calculation (min): 73 min   Short Term Goals: Week 1:  PT Short Term Goal 1 (Week 1): pt will transfer bed<>chair with LRAD and CGA PT Short Term Goal 2 (Week 1): pt will transfer sit<>stand with LRAD and CGA PT Short Term Goal 3 (Week 1): pt will amblate 57ft with LRAD and mod A  Skilled Therapeutic Interventions/Progress Updates:      Pt supine in bed upon arrival. Pt agreeable to therapy. Pt denies any pain.   Pt performed supine to sit with supervision.   Pt performed lateral scoot transfer bed to WC with CGA to stabilize WC. Pt demos teach back of donning/doffing arm rests. Pt donned L LE leg rest with min A 2/2 visual deficits, pt able to donn R leg rests with increased time and supervision.   Pt performed sit to stand from [facility] with RW and min A, however pt demos significant R LE quad avoidance, hip adduction and external rotation compensation. Transitioned to sit to stand with RW from elevated mat table  for improved safety and pt demos same compensations-therapist attempted to correct with use of R LE approximation . Pt performed mass practice of sit to stand in // bars with PT guarding to prevent excessive hip adduction/external rotation compensation and to facilitate improved quad activation. Pt demonstrates improved quality of movement with increased repetitions, verbal and tactile cues provided for R LE positioning, anterior weight shift, and midline orientation.   Pt performed standing L hip abduction 2x10 with verbal and tactile cues provided for hip/trunk extension/upright posture.   Pt performed 3x10 LAQ with 4# ankle weight, pt demos improved carry over of controlled movement, with verbal cues for controlled descent.    Pt seated in [facility] with all needs within reach and chair alarm on.        Therapy Documentation Precautions:  Precautions Precautions: Fall Recall of Precautions/Restrictions: Intact Precaution/Restrictions Comments: L LE wound vac, L BKA; watch BP - when standing Restrictions Weight Bearing Restrictions Per Provider Order: No LLE Weight Bearing Per Provider Order: Non weight bearing Other Position/Activity Restrictions: Limb protector is ordered and will arrive later on 07/31/23.  Therapy/Group: Individual Therapy  [facility] Jenney Modest, [place], DPT  08/04/2023, 7:53 AM

## 2023-08-05 DIAGNOSIS — F54 Psychological and behavioral factors associated with disorders or diseases classified elsewhere: Secondary | ICD-10-CM

## 2023-08-05 LAB — GLUCOSE, CAPILLARY
Glucose-Capillary: 112 mg/dL — ABNORMAL HIGH (ref 70–99)
Glucose-Capillary: 93 mg/dL (ref 70–99)
Glucose-Capillary: 137 mg/dL — ABNORMAL HIGH (ref 70–99)
Glucose-Capillary: 106 mg/dL — ABNORMAL HIGH (ref 70–99)

## 2023-08-05 LAB — OCCULT BLOOD X 1 CARD TO LAB, STOOL: Fecal Occult Bld: NEGATIVE

## 2023-08-05 MED ORDER — INSULIN GLARGINE-YFGN 100 UNIT/ML ~~LOC~~ SOLN
5.0000 [IU] | SUBCUTANEOUS | Status: DC
  Administered 2023-08-05: 5 [IU] via SUBCUTANEOUS
  Filled 2023-08-05 (×2): qty 0.05

## 2023-08-05 MED ORDER — SENNA 8.6 MG PO TABS: 1.0000 | ORAL_TABLET | Freq: Every evening | ORAL | Status: DC | PRN

## 2023-08-05 NOTE — Progress Notes (Signed)
 PROGRESS NOTE   Subjective/Complaints: Blood pressure stable Had diarrhea earlier and took imodium Has restarted Monjaro Bleeding decreased  ROS: pain is well controlled, +constipated   Objective:   No results found. Recent Labs    08/02/23 2157 08/04/23 0503  WBC 13.0* 11.1*  HGB 8.4* 8.3*  HCT 24.3* 25.0*  PLT 339 377   Recent Labs    08/04/23 0503  NA 134*  K 3.8  CL 99  CO2 28  GLUCOSE 118*  BUN 39*  CREATININE 1.16  CALCIUM  8.1*     Intake/Output Summary (Last 24 hours) at 08/05/2023 0850 Last data filed at 08/05/2023 0459 Gross per 24 hour  Intake 600 ml  Output 450 ml  Net 150 ml        Physical Exam: Vital Signs Blood pressure 132/74, pulse 95, temperature 97.9 F (36.6 C), resp. rate 17, height 6' (1.829 m), SpO2 99%. Gen: no distress, normal appearing.  Sitting up in bed. HEENT: oral mucosa pink and moist, NCAT Cardiovascular:     Rate and Rhythm: Normal rate and regular rhythm.     Pulses: Normal pulses.     Heart sounds: Murmur heard.   Pulmonary:     Effort: Pulmonary effort is normal. No respiratory distress.     Breath sounds: Normal breath sounds. No wheezing or rhonchi.  Abdominal:     General: Abdomen is protuberant. Bowel sounds are normal. There is no distension.     Palpations: Abdomen is soft.     Tenderness: There is no abdominal tenderness. There is no guarding or rebound.   Musculoskeletal:        General: Normal range of motion.     Cervical back: Normal range of motion.     Right lower leg: Normal. No swelling. No edema.     Comments: Left BKA-wrapped under dry dressing--full active range of motion in knee extension.  Limb guard not being used due to being covered in blood.  Skin: Small bruise on left BKA and lateral side as below; otherwise skin well-approximated and sutures intact.  Mild amount of serosanguineous drainage, stable 6/3    Neurological:      Mental Status: He is alert and oriented to person, place, and time. Mental status is at baseline.     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        Behavior: Behavior normal.        Thought Content: Thought content normal.        Judgment: Judgment normal.     Assessment/Plan: 1. Functional deficits which require 3+ hours per day of interdisciplinary therapy in a comprehensive inpatient rehab setting.  Physiatrist is providing close team supervision and 24 hour management of active medical problems listed below. Physiatrist and rehab team continue to assess barriers to discharge/monitor patient progress toward functional and medical goals  Care Tool:  Bathing    Body parts bathed by patient: Right arm, Left arm, Chest, Abdomen, Front perineal area, Buttocks, Face   Body parts bathed by helper: Left upper leg, Right upper leg, Right lower leg Body parts n/a: Left lower leg   Bathing assist Assist Level: Moderate Assistance - Patient 50 - 74%     Upper Body Dressing/Undressing Upper body dressing   What is the patient wearing?: Pull over shirt    Upper body assist Assist Level: Supervision/Verbal cueing    Lower Body Dressing/Undressing Lower body dressing      What is the patient wearing?: Underwear/pull up, Pants     Lower body assist Assist for lower body dressing: Minimal Assistance - Patient > 75%     Toileting Toileting    Toileting assist Assist for toileting: Minimal Assistance - Patient > 75%     Transfers Chair/bed  transfer  Transfers assist     Chair/bed transfer assist level: Minimal Assistance - Patient > 75%     Locomotion Ambulation   Ambulation assist   Ambulation activity did not occur: Safety/medical concerns (decreased balance, fatigue, weakness)          Walk 10 feet activity   Assist  Walk 10 feet activity did not occur: Safety/medical concerns (decreased balance, fatigue, weakness)        Walk 50 feet activity   Assist Walk 50 feet with 2 turns activity did not occur: Safety/medical concerns (decreased balance, fatigue, weakness)         Walk 150 feet activity   Assist Walk 150 feet activity did not occur: Safety/medical concerns (decreased balance, fatigue, weakness)         Walk 10 feet on uneven surface  activity   Assist Walk 10 feet on uneven surfaces activity did not occur: Safety/medical concerns (decreased balance, fatigue, weakness)         Wheelchair     Assist Is the patient using a wheelchair?: Yes Type of Wheelchair: Manual    Wheelchair assist level: Supervision/Verbal cueing Max wheelchair distance: 150+    Wheelchair 50 feet with 2 turns activity    Assist        Assist Level: Supervision/Verbal cueing   Wheelchair 150 feet activity     Assist      Assist Level: Supervision/Verbal cueing   Blood pressure 132/74, pulse 95, temperature 97.9 F (36.6 C), resp. rate 17, height 6' (1.829 m), SpO2 99%.  Medical Problem List and Plan: 1. 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             -patient may not shower             -ELOS/Goals: 12-14 days, PT/OT sup             -Continue CIR             -Dr Julio Ohm note 5/26 indicated that wound VAC can be removed in about a week  Shrinker and limb guard ordered  Continue dry dressing changes  Wound assessed and picture taken for chart on 6/3  2. Impaired mobility: d/ced Lovenox  due to bleeding as per ortho recs            3. Pain  Management: continue oxycodone  5-10 mg  PRN.              -monitor for breakthrough             -tylenol  PRN   4. Mood/Behavior/Sleep: LCSW to follow for evaluation and support.             -antipsychotic agents: N/A             -order for Trazadone prn          5. Neuropsych/cognition: This patient is capable of making decisions on his own behalf. 6. Skin/Wound Care: Maintain  wound VAC for 14 days (08/08/2023).              - Continue zinc  and vitamin C              -Prevalon Boot ordered   7. Fluids/Electrolytes/Nutrition: monitor I/O, check CMET              -encourage fluids(AKI)              -Continue Juven supplements/ Diet: Carb modified    8. TDM2: Hemoglobin A1c 6.7 (stable) - Monitor B/S use SSI for elevated blood sugar--current BS rang 140-160s -Continue Mounjaro  15 mg subcutaneous injection Friday (nonformulary requested/wife to bring from home). D/c HS ISS, d/c daytime ISS, decrease semglee  to 5U Recent Labs    08/04/23 1625 08/04/23 2050 08/05/23 0552  GLUCAP 131* 142* 112*     9. HLD: continue Crestor  20 mg daily/pm  10.  HTN/Orthostatic hypotension: Monitor BP's TID -Valsartan  being held (RX note) -check orthostatic VS -reviewed medications and is not on any anti-hypertensives - Blood pressure well-controlled    08/05/2023    5:00 AM 08/04/2023    7:41 PM 08/04/2023    2:51 PM  Vitals with BMI  Systolic 132 146 295  Diastolic 74 75 69  Pulse 95 97 99    11. Anemia: discussed that Hgb has improved to 8.3  12.  Prolonged Qtc:             - Avoid QTc prolonging medications, monitor electrolytes  13.  AKI:  Cr reviewed and has improved  14.  Distal muscle atrophy and weakness.  Possible neuropathy related.  Consider outpatient EMG/nerve conduction study  15. Constipation: encouraged fruits and vegetables      LOS: 6 days A FACE TO FACE EVALUATION WAS PERFORMED  Scott Navarro P Xylan Sheils 08/05/2023, 8:50 AM

## 2023-08-05 NOTE — Consult Note (Signed)
  08/05/2023:  Attempted to see patient today but OT already in room working with patient even though I was schedule on [place].  Will try to go in later today.

## 2023-08-05 NOTE — Progress Notes (Signed)
 Physical Therapy Session Note  Patient Details  Name: Scott Navarro. MRN: [id number] Date of Birth: 05-05-1965  Today's Date: 08/05/2023 PT Individual Time: [phone] and 3086-5784 PT Individual Time Calculation (min): 43 min and 56 min  Short Term Goals: Week 1:  PT Short Term Goal 1 (Week 1): pt will transfer bed<>chair with LRAD and CGA PT Short Term Goal 2 (Week 1): pt will transfer sit<>stand with LRAD and CGA PT Short Term Goal 3 (Week 1): pt will amblate 6ft with LRAD and mod A  Skilled Therapeutic Interventions/Progress Updates:   Treatment Session 1 Received pt semi-reclined in bed, pt agreeable to PT treatment, and did not c/o pain during session. Session with emphasis on functional mobility/transfers, generalized strengthening and endurance, and dynamic standing balance/coordination. Donned limb guard with min A and secure chatted MD regarding continued dressing changes vs wearing shrinker.   Pt transported to main gym in [facility] dependently for time management purposes. Transferred on/off mat via lateral scoot with close supervision. Worked on blocked practice sit<>stands with RW and min A fading to CGA 2x5 reps with emphasis on quad strength and eccentric control - cues to avoid leaning R posterior leg on mat and to reach back prior to sitting (no buckling noted).   Pt then performed tricep extensions on yoga blocks 2x10 transitioning to lateral trunk rotations with 3.3lb medicine ball x10 progressing to additional x10 while sitting on dynodisc to further challenge core strength. Performed WC mobility ~56ft using BUE and supervision, then transported back to room dependently and MD arrived for morning rounds. Removed limb guard, ace wraps, and gauze to inspect incision - noted mild bright red drainage from L lateral aspect of limb. MD cleared pt to wear shrinker - donned 4XL shrinker dependently and discussed adding gauze around outside of shrinker if pt bleeds through - RN notified.  Concluded session with pt sitting in [facility] with all needs within reach.   Treatment Session 2 Received pt sitting in WC, pt agreeable to PT treatment, and denied any pain during session. Session with emphasis on functional mobility/transfers, generalized strengthening and endurance, and dynamic standing balance/coordination. Pt performed WC mobility 175ft using BUE and supervision to main therapy gym with emphasis on UE strength. Worked on [employer] multiple times throughout session but pt needs continued practice.   Stood in // bars x 2 trials with min A and worked on RLE heel raises 4x8 reps with light blocking/guarding on R knee but no true buckling noted - noted decreased R heel clearance and compensatory patterns. Also worked on eccentric control when sitting due to tendency for R knee to give out/buckle when sitting. Stood x 2 additional trials with CGA and performed L hip flexion 2x10 and L hip abduction 2x10 with emphasis on LE strength/ROM.   Transported to dayroom and transferred on/off Nustep via squat<>pivot with CGA and performed seated BUE and RLE strengthening on Nustep at workload 3 for 8 minutes using P[employer] Partner for a total of 608 steps with emphasis on cardiovascular endurance - SPM 73, 1.9 METs,at 0.42mph. Pt then performed seated R knee extension 2x20 with 3lb ankle weight, R hip flexion 2x10 with 3lb ankle weight, and bicep curls 210 bilaterally with 6lb dumbbell. Transported back to room in [facility] dependently and concluded session with pt sitting in [facility] with all needs within reach.    Therapy Documentation Precautions:  Precautions Precautions: Fall Recall of Precautions/Restrictions: Intact Precaution/Restrictions Comments: L LE wound vac, L BKA; watch BP - when  standing Restrictions Weight Bearing Restrictions Per Provider Order: No LLE Weight Bearing Per Provider Order: Non weight bearing Other Position/Activity Restrictions: Limb protector is ordered and will  arrive later on 07/31/23.  Therapy/Group: Individual Therapy [name] Nena Bank PT, DPT 08/05/2023, 6:55 AM

## 2023-08-05 NOTE — Progress Notes (Signed)
 Physical Therapy Session Note  Patient Details  Name: Scott Navarro. MRN: [id number] Date of Birth: October 28, 1965  Today's Date: 08/05/2023 PT Individual Time: 1001-1045 PT Individual Time Calculation (min): 44 min   Short Term Goals: Week 1:  PT Short Term Goal 1 (Week 1): pt will transfer bed<>chair with LRAD and CGA PT Short Term Goal 2 (Week 1): pt will transfer sit<>stand with LRAD and CGA PT Short Term Goal 3 (Week 1): pt will amblate 45ft with LRAD and mod A  Skilled Therapeutic Interventions/Progress Updates: Pt presents sitting in w/c and agreeable to therapy.  Pt wheeled self to dayroom w/ supervision and to mat table.  Pt required min A for removing leg rest/limb board.  Pt performed lateral scoot/squat pivot transfer w/ CGA w/c > mat table.  Pt removed limb guard w/ supervision.  Pt transferred sit to supine and then to prone position.  Pt performed knee flexion 3 x 10-15.  Pt rolled to R sidelying for hip/knee flex/ext, abd 3 x 10.  Pt transferred supine for unilateral bridging.  Pt   donned limb guard  and then squat pivot mat > w/c w/ CGA, cues for forward lean for use of RLE.  Pt returned to room and rmeianed sitting in w/c w/ all needs in reach.     Therapy Documentation Precautions:  Precautions Precautions: Fall Recall of Precautions/Restrictions: Intact Precaution/Restrictions Comments: L LE wound vac, L BKA; watch BP - when standing Restrictions Weight Bearing Restrictions Per Provider Order: No LLE Weight Bearing Per Provider Order: Non weight bearing Other Position/Activity Restrictions: Limb protector is ordered and will arrive later on 07/31/23. General:   Vital Signs:   Pain:unrateable. Pain Assessment Pain Scale: 0-10 Pain Score: 7  Pain Location: Leg   Therapy/Group: Individual Therapy  Shantoya Geurts P Maddisen Vought 08/05/2023, 10:46 AM

## 2023-08-05 NOTE — Progress Notes (Signed)
 Occupational Therapy Session Note  Patient Details  Name: Scott Navarro. MRN: [id number] Date of Birth: [birth year]  Today's Date: 08/05/2023 OT Individual Time: 1120-1200 OT Individual Time Calculation (min): 40 min    Short Term Goals: Week 1:  OT Short Term Goal 1 (Week 1): pt will demonstrate improved BUE and core strength while completing stand pivot transfer with Min A utilizing RW. OT Short Term Goal 2 (Week 1): Pt will demonstrate improved safety awareness and problem solving while navigating manual WC within personal room and rehab unit with Min A.  Skilled Therapeutic Interventions/Progress Updates:  Skilled OT intervention completed with focus on ADL retraining, w/c mobility and BUE strengthening. Pt received seated in w/c, agreeable to session. No pain reported.  Pt requested to "take care of the mop on the head." OT offered a full shower in prep for DC and ADL retraining, however pt declined. Pt reports he plans to shower at home but does not desire to do here. Education provided on use of hair washing tray at sink- utilized with total A while pt sat in w/c. Set up A for hair grooming.  Pt self-propelled in w/c about 100 ft with mod I. Seated in, pt completed the following BUE exercises to promote BUE strength/endurance needed for independence with functional transfers and BADLs: (With 5 lb dowel) -15 chest press -15 bicep curls Discussed the muscular wasting in bilateral hands and how that can affect dexterity and FMC  Transported dependently in w/c > room. Pt remained seated in w/c, with chair alarm on/activated, and with all needs in reach at end of session.   Therapy Documentation Precautions:  Precautions Precautions: Fall Recall of Precautions/Restrictions: Intact Precaution/Restrictions Comments: L BKA; watch BP - when standing Restrictions Weight Bearing Restrictions Per Provider Order: No LLE Weight Bearing Per Provider Order: Non weight bearing Other  Position/Activity Restrictions: Limb protector is ordered and will arrive later on 07/31/23.    Therapy/Group: Individual Therapy  Ruthanna Covert, MS, OTR/L  08/05/2023, 12:19 PM

## 2023-08-06 LAB — GLUCOSE, CAPILLARY
Glucose-Capillary: 101 mg/dL — ABNORMAL HIGH (ref 70–99)
Glucose-Capillary: 106 mg/dL — ABNORMAL HIGH (ref 70–99)
Glucose-Capillary: 107 mg/dL — ABNORMAL HIGH (ref 70–99)
Glucose-Capillary: 150 mg/dL — ABNORMAL HIGH (ref 70–99)

## 2023-08-06 MED ORDER — INSULIN GLARGINE-YFGN 100 UNIT/ML ~~LOC~~ SOLN
4.0000 [IU] | SUBCUTANEOUS | Status: DC
  Administered 2023-08-06 – 2023-08-07 (×2): 4 [IU] via SUBCUTANEOUS
  Filled 2023-08-06 (×3): qty 0.04

## 2023-08-06 NOTE — Progress Notes (Signed)
 Physical Therapy Session Note  Patient Details  Name: Scott Navarro. MRN: [id number] Date of Birth: 04-15-1965  Today's Date: 08/06/2023 PT Individual Time: 1300-1410 PT Individual Time Calculation (min): 70 min   Short Term Goals: Week 1:  PT Short Term Goal 1 (Week 1): pt will transfer bed<>chair with LRAD and CGA PT Short Term Goal 2 (Week 1): pt will transfer sit<>stand with LRAD and CGA PT Short Term Goal 3 (Week 1): pt will amblate 14ft with LRAD and mod A  Skilled Therapeutic Interventions/Progress Updates:   Received pt sitting in WC, pt agreeable to PT treatment, and denied any pain during session. Session with emphasis on functional mobility/transfers, generalized strengthening and endurance, dynamic standing balance/coordination, and NMR.   Pt performed WC mobility 149ft using BUE and supervision to main therapy gym with emphasis on UE strength and coordination. Pt able to set up transfer to mat with supervision including doffing legrests, removing flip back armrest, positioning WC, and performing lateral scoot with close supervision. Stood from mat with RW and CGA x 4 trials and worked on Airline pilot with RUE with CGA/light min A for balance. Of note, pt stands with R foot in ER but able to perform small "hop" to get R foot in neutral. Stood again form EOM with RW and CGA and performed standing L hip flexion 2x10 and L hip abduction 2x10 with CGA for balance.   Transitioned into prone with supervision and performed the following exercises with emphasis on hip flexor stretching, LE strength, and ROM: -prone hamstring curls 2x10 on LLE and with 1.5lb ankle weight for 1x10 and 1x5 on RLE - removed ankle weight and completed remaining x5 on RLE unweighted.  -prone L hip extension 2x10 -prone<>prone on elbows 2x10 with 5 second hold -supine hamstring stretch 3x20 seconds bilaterally -R sidelying L hip abduction 2x10 -R sidelying L hip  extension 2x10 -supine R single leg bridges 2x8 Pt transferred supine<>sitting EOM with supervision and stood x 3 additional trials from EOM with RW and CGA. Attempted to work on [employer]" to clear R foot but pt unable to boost up with UEs. Attempted standing R heel raises, but again, pt unable to boost up on UEs today and frustrated. Transitioned to seated tricep extensions on yoga blocks 2x10 with emphasis on UE strength. Performed squat<>pivot mat<>WC to R with close supervision and transported back to room in WC dependently. Concluded session with pt sitting in [facility] with all needs within reach.   Therapy Documentation Precautions:  Precautions Precautions: Fall Recall of Precautions/Restrictions: Intact Precaution/Restrictions Comments: L LE wound vac, L BKA; watch BP - when standing Restrictions Weight Bearing Restrictions Per Provider Order: No LLE Weight Bearing Per Provider Order: Non weight bearing Other Position/Activity Restrictions: Limb protector is ordered and will arrive later on 07/31/23.  Therapy/Group: Individual Therapy [name] Nena Bank PT, DPT 08/06/2023, 6:58 AM

## 2023-08-06 NOTE — Progress Notes (Signed)
 PROGRESS NOTE   Subjective/Complaints: Blood pressure stable Had diarrhea earlier and took imodium Has restarted Monjaro Bleeding decreased  ROS: pain is well controlled, moving bowels regularly   Objective:   No results found. Recent Labs    08/04/23 0503  WBC 11.1*  HGB 8.3*  HCT 25.0*  PLT 377   Recent Labs    08/04/23 0503  NA 134*  K 3.8  CL 99  CO2 28  GLUCOSE 118*  BUN 39*  CREATININE 1.16  CALCIUM  8.1*     Intake/Output Summary (Last 24 hours) at 08/06/2023 1152 Last data filed at 08/06/2023 0700 Gross per 24 hour  Intake 1200 ml  Output --  Net 1200 ml        Physical Exam: Vital Signs Blood pressure 134/78, pulse 96, temperature 97.9 F (36.6 C), temperature source Oral, resp. rate 18, height 6' (1.829 m), SpO2 98%. Gen: no distress, normal appearing.  Sitting up in bed. HEENT: oral mucosa pink and moist, NCAT Cardiovascular:     Rate and Rhythm: Normal rate and regular rhythm.     Pulses: Normal pulses.     Heart sounds: Murmur heard.   Pulmonary:     Effort: Pulmonary effort is normal. No respiratory distress.     Breath sounds: Normal breath sounds. No wheezing or rhonchi.  Abdominal:     General: Abdomen is protuberant. Bowel sounds are normal. There is no distension.     Palpations: Abdomen is soft.     Tenderness: There is no abdominal tenderness. There is no guarding or rebound.   Musculoskeletal:        General: Normal range of motion.     Cervical back: Normal range of motion.     Right lower leg: Normal. No swelling. No edema.     Comments: Left BKA-wrapped under dry dressing--full active range of motion in knee extension.  Limb guard not being used due to being covered in blood.  Skin: Small bruise on left BKA and lateral side as below; otherwise skin well-approximated and sutures intact.  Bleeding from residual limb has reduced significantly    Neurological:      Mental Status: He is alert and oriented to person, place, and time. Mental status is at baseline.     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        Behavior: Behavior normal.        Thought Content: Thought content normal.        Judgment: Judgment normal.     Assessment/Plan: 1. Functional deficits which require 3+ hours per day of interdisciplinary therapy in a comprehensive inpatient rehab setting. Physiatrist is  providing close team supervision and 24 hour management of active medical problems listed below. Physiatrist and rehab team continue to assess barriers to discharge/monitor patient progress toward functional and medical goals  Care Tool:  Bathing    Body parts bathed by patient: Right arm, Left arm, Chest, Abdomen, Front perineal area, Buttocks, Face   Body parts bathed by helper: Left upper leg, Right upper leg, Right lower leg Body parts n/a: Left lower leg   Bathing assist Assist Level: Moderate Assistance - Patient 50 - 74%     Upper Body Dressing/Undressing Upper body dressing   What is the patient wearing?: Pull over shirt    Upper body assist Assist Level: Supervision/Verbal cueing    Lower Body Dressing/Undressing Lower body dressing      What is the patient wearing?: Underwear/pull up, Pants     Lower body assist Assist for lower body dressing: Minimal Assistance - Patient > 75%     Toileting Toileting    Toileting assist Assist for toileting: Minimal Assistance - Patient > 75%     Transfers Chair/bed  transfer  Transfers assist     Chair/bed transfer assist level: Contact Guard/Touching assist     Locomotion Ambulation   Ambulation assist   Ambulation activity did not occur: Safety/medical concerns (decreased balance, fatigue, weakness)          Walk 10 feet activity   Assist  Walk 10 feet activity did not occur: Safety/medical concerns (decreased balance, fatigue, weakness)        Walk 50 feet activity   Assist Walk 50 feet with 2 turns activity did not occur: Safety/medical concerns (decreased balance, fatigue, weakness)         Walk 150 feet activity   Assist Walk 150 feet activity did not occur: Safety/medical concerns (decreased balance, fatigue, weakness)         Walk 10 feet on uneven surface  activity   Assist Walk 10 feet on uneven surfaces activity did not occur: Safety/medical concerns (decreased balance, fatigue, weakness)         Wheelchair     Assist Is the patient using a wheelchair?: Yes Type of Wheelchair: Manual    Wheelchair assist level: Supervision/Verbal cueing Max wheelchair distance: 150    Wheelchair 50 feet with 2 turns activity    Assist        Assist Level: Supervision/Verbal cueing   Wheelchair 150 feet activity     Assist      Assist Level: Supervision/Verbal cueing   Blood pressure 134/78, pulse 96, temperature 97.9 F (36.6 C), temperature source Oral, resp. rate 18, height 6' (1.829 m), SpO2 98%.  Medical Problem List and Plan: 1. 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             -patient may not shower             -ELOS/Goals: 12-14 days, PT/OT sup             -Continue CIR             -Dr Julio Ohm note 5/26 indicated that wound VAC can be removed in about a week  Shrinker and limb guard ordered  Continue dry dressing changes  Wound assessed and picture taken for chart on 6/3  2. Impaired mobility: d/ced Lovenox  due to bleeding as per ortho recs             3. Pain Management: continue oxycodone  5-10 mg PRN.              -  monitor for breakthrough             -tylenol  PRN   4. Mood/Behavior/Sleep: LCSW to follow for evaluation and support.             -antipsychotic agents: N/A             -order for Trazadone prn          5. Neuropsych/cognition: This patient is capable of making decisions on his own behalf. 6. Skin/Wound Care: Maintain  wound VAC for 14 days (08/08/2023).              - Continue zinc  and vitamin C              -Prevalon Boot ordered   7. Fluids/Electrolytes/Nutrition: monitor I/O, check CMET              -encourage fluids(AKI)              -Continue Juven supplements/ Diet: Carb modified    8. TDM2: Hemoglobin A1c 6.7 (stable) - Monitor B/S use SSI for elevated blood sugar--current BS rang 140-160s -Continue Mounjaro  15 mg subcutaneous injection Friday (nonformulary requested/wife to bring from home). D/c HS ISS, d/c daytime ISS, decrease semglee  to 4U Recent Labs    08/05/23 2055 08/06/23 0533 08/06/23 1141  GLUCAP 106* 101* 106*     9. HLD: continue Crestor  20 mg daily/pm  10.  HTN/Orthostatic hypotension: Monitor BP's TID -Valsartan  being held (RX note) -check orthostatic VS -reviewed medications and is not on any anti-hypertensives - Blood pressure well-controlled    08/06/2023    5:36 AM 08/05/2023    7:38 PM 08/05/2023    1:14 PM  Vitals with BMI  Systolic 134 136 147  Diastolic 78 67 72  Pulse 96 95 96    11. Anemia: discussed that Hgb has improved to 8.3, monitor weekly  12.  Prolonged Qtc:             - Avoid QTc prolonging medications, monitor electrolytes  13.  AKI:  Cr reviewed and has improved  14.  Distal muscle atrophy and weakness.  Possible neuropathy related.  Consider outpatient EMG/nerve conduction study  15. Constipation: encouraged fruits and vegetables, continue senna prn HS      LOS: 7 days A FACE TO FACE EVALUATION WAS PERFORMED  Mitsuru Dault P Aliza Moret 08/06/2023, 11:52  AM

## 2023-08-06 NOTE — Progress Notes (Signed)
 Occupational Therapy Session Note  Patient Details  Name: Scott Navarro. MRN: [id number] Date of Birth: 1965-09-08  Today's Date: 08/06/2023 OT Individual Time: 4098-1191 OT Individual Time Calculation (min): 65 min   Short Term Goals: Week 1:  OT Short Term Goal 1 (Week 1): pt will demonstrate improved BUE and core strength while completing stand pivot transfer with Min A utilizing RW. OT Short Term Goal 2 (Week 1): Pt will demonstrate improved safety awareness and problem solving while navigating manual WC within personal room and rehab unit with Min A.  Skilled Therapeutic Interventions/Progress Updates:  Pt greeted resting in bed, no complaints of pain. Bed mobility with supervision, lateral/scoot transfer from EOB>WC with CGA. Time dedicated to educating patient on need to wear limb guard for all transfers as patient states ". . . Now I dont wear this thing to go to the bathroom." Pt propels WC to all therapy locations with supervision. In ortho gym, pt instructed in x10 mins of Scifit arm bike for BUE strengthening required for functional transfers, pt tolerates level 3 resistance. In ADL apartment, pt and OT review home-kitchen setup with emphasis placed on techniques for safe meal prep and transportation as well as methods to increase accessibility. Pt receptive to all education. Additional education provided on parameters/strategies for pressure relieving in WC. Pt remained sitting in WC with all immediate needs met.   Therapy Documentation Precautions:  Precautions Precautions: Fall Recall of Precautions/Restrictions: Intact Precaution/Restrictions Comments: L LE wound vac, L BKA; watch BP - when standing Restrictions Weight Bearing Restrictions Per Provider Order: No LLE Weight Bearing Per Provider Order: Non weight bearing Other Position/Activity Restrictions: Limb protector is ordered and will arrive later on 07/31/23.   Therapy/Group: Individual Therapy  Artemus Biles, OTR/L, MSOT  08/06/2023, 6:20 AM

## 2023-08-06 NOTE — Progress Notes (Signed)
 Occupational Therapy Weekly Progress Note  Patient Details  Name: Scott Navarro. MRN: [id number] Date of Birth: 04/13/1965  Beginning of progress report period: Jul 31, 2023 End of progress report period: August 06, 2023   Patient has met 2 of 2 short term goals. Pt is making steady progress towards LTGs. He is able to bathe and dress with min A, and requires mod A for toileting tasks. Pt continues to demonstrate dynamic standing balance, gross motor coordination and functional endurance deficits along with BUE weakness with muscular wasting/atrophy, resulting in difficulty completing BADL tasks without increased physical assist. Pt will benefit from continued skilled OT services to focus on mentioned deficits. Family ed not initiated as of date and will be needed in prep for DC.   Patient continues to demonstrate the following deficits: muscle weakness, decreased cardiorespiratoy endurance, decreased coordination, and decreased standing balance and therefore will continue to benefit from skilled OT intervention to enhance overall performance with BADL and Reduce care partner burden.  Patient progressing toward [place] term goals..  Continue plan of care.  OT Short Term Goals Week 1:  OT Short Term Goal 1 (Week 1): pt will demonstrate improved BUE and core strength while completing stand pivot transfer with Min A utilizing RW. OT Short Term Goal 1 - Progress (Week 1): Met OT Short Term Goal 2 (Week 1): Pt will demonstrate improved safety awareness and problem solving while navigating manual WC within personal room and rehab unit with Min A. OT Short Term Goal 2 - Progress (Week 1): Met Week 2:  OT Short Term Goal 1 (Week 2): Pt will complete 2/3 toileting steps with CGA for balance OT Short Term Goal 2 (Week 2): Pt will donn shrinker with supervision/cues    Laquinta Hazell E Jory Welke, MS, OTR/L  08/06/2023, 7:59 AM

## 2023-08-06 NOTE — Progress Notes (Signed)
 Pt slept comfortably all night. Needs addressed, no complaints.

## 2023-08-06 NOTE — Progress Notes (Signed)
 Occupational Therapy Session Note  Patient Details  Name: Scott Navarro. MRN: [id number] Date of Birth: 09/17/1965  Today's Date: 08/06/2023  OT Individual Time: 0950-1015 & 1450-1530 OT Individual Time Calculation (min): 25 min & 40 min   Short Term Goals: Week 1:  OT Short Term Goal 1 (Week 1): pt will demonstrate improved BUE and core strength while completing stand pivot transfer with Min A utilizing RW. OT Short Term Goal 2 (Week 1): Pt will demonstrate improved safety awareness and problem solving while navigating manual WC within personal room and rehab unit with Min A.  Skilled Therapeutic Interventions/Progress Updates:  Session 1 Skilled OT intervention completed with focus on functional sit > stands, w/c mobility. Pt received seated in w/c, agreeable to session. No pain reported.  Pt declined self care needs. Pt with picture of currently owned w/c. Discussed with pt about flip back arm rests for lateral scoots, and option of using elevating leg rest as amputee support pad by removing foot plate. Transported dependently in w/c > gym for time. Mod A needed to remove leg rests due to low vision.   Completed 1 sit > stand with Min A, with pt using back of R knee to power up with RW then when in stance, pt lost bilateral hand grip on RW, with anterior LOB, requiring mod A to recover and bringing RW back to pt. OT added coban to bilateral handles on RW for increased grip due to atrophy. Retried with new adaptation and cues to avoid using knee on w/c, then able to stand with CGA using RW without LOB. Pt self-propelled in w/c > room with mod I. Pt remained seated in w/c, with chair alarm on/activated, and with all needs in reach at end of session.  Session 2 Skilled OT intervention completed with focus on cardiovascular endurance, functional transfers, w/c mobility. Pt received seated in w/c, agreeable to session. No pain reported.  Pt self propelled in w/c <> gym with mod I. Required  cues for positioning w/c safely next to nustep. CGA squat pivot from w/c <> nustep. Pt completed the following intervals on nustep utilizing pace partner for sustained pace, to promote global/cardiovascular endurance needed for independence with BADLs and functional mobility: -5 mins, level 3 -7 mins, level 4 Intermittent rest break needed for fatigue.  Seated in w/c, pt participated in w/c obstacle course with cones on floor with initial min A for narrow spaces, however with cues for cutting wider to accommodate leg rest length, pt able to do so with supervision. Had pt retrieve cones with a reacher- discussed how task relates to home accessibility and if he drops item on floor for fall prevention.   Back in room, pt remained seated in w/c, with chair alarm on/activated, and with all needs in reach at end of session.   Therapy Documentation Precautions:  Precautions Precautions: Fall Recall of Precautions/Restrictions: Intact Precaution/Restrictions Comments: watch BP Restrictions Weight Bearing Restrictions Per Provider Order: Yes LLE Weight Bearing Per Provider Order: Non weight bearing Other Position/Activity Restrictions: LLE limb guard    Therapy/Group: Individual Therapy  Ruthanna Covert, MS, OTR/L  08/06/2023, 3:49 PM

## 2023-08-07 LAB — GLUCOSE, CAPILLARY
Glucose-Capillary: 102 mg/dL — ABNORMAL HIGH (ref 70–99)
Glucose-Capillary: 118 mg/dL — ABNORMAL HIGH (ref 70–99)
Glucose-Capillary: 123 mg/dL — ABNORMAL HIGH (ref 70–99)
Glucose-Capillary: 134 mg/dL — ABNORMAL HIGH (ref 70–99)

## 2023-08-07 LAB — BASIC METABOLIC PANEL WITH GFR
Anion gap: 5 (ref 5–15)
BUN: 42 mg/dL — ABNORMAL HIGH (ref 6–20)
CO2: 29 mmol/L (ref 22–32)
Calcium: 8.1 mg/dL — ABNORMAL LOW (ref 8.9–10.3)
Chloride: 102 mmol/L (ref 98–111)
Creatinine, Ser: 1.2 mg/dL (ref 0.61–1.24)
GFR, Estimated: >60 mL/min (ref >60)
Glucose, Bld: 103 mg/dL — ABNORMAL HIGH (ref 70–99)
Potassium: 4.4 mmol/L (ref 3.5–5.1)
Sodium: 136 mmol/L (ref 135–145)

## 2023-08-07 LAB — CBC
HCT: 24.2 % — ABNORMAL LOW (ref 39.0–52.0)
Hemoglobin: 8.2 g/dL — ABNORMAL LOW (ref 13.0–17.0)
MCH: 30.5 pg (ref 26.0–34.0)
MCHC: 33.9 g/dL (ref 30.0–36.0)
MCV: 90 fL (ref 80.0–100.0)
Platelets: 374 10*3/uL (ref 150–400)
RBC: 2.69 MIL/uL — ABNORMAL LOW (ref 4.22–5.81)
RDW: 13.2 % (ref 11.5–15.5)
WBC: 9.3 10*3/uL (ref 4.0–10.5)
nRBC: 0 % (ref 0.0–0.2)

## 2023-08-07 NOTE — Progress Notes (Signed)
 Patient ID: Scott Navarro., male   DOB: Jan 09, 1966, 58 y.o.   MRN: [id number]  SW met with pt in room to provide updates from team conference, d/c date 6/111, and brining in w/c. He will have friend bring in w/c. He is aware SW will follow-up with his wife.   [name], MSW, LCSW Office: [phone] Cell: [phone] Fax: [phone]

## 2023-08-07 NOTE — Progress Notes (Signed)
 Patient ID: Scott Life., male   DOB: 27-Sep-1965, 58 y.o.   MRN: [id number]  1126- SW called pt wife to inform on updates from team conference, and d/c date 6/11. Reports he did share discharge date with her. SW discussed family edu. Fam edu on Monday 2pm-4pm. Confirms friend will bring in w/c today.   [name], MSW, LCSW Office: [phone] Cell: [phone] Fax: [phone]

## 2023-08-07 NOTE — Patient Care Conference (Signed)
 Inpatient RehabilitationTeam Conference and Plan of Care Update Date: 08/06/2023   Time: 11:48 AM    Patient Name: Scott Navarro.      Medical Record Number: [id number]  Date of Birth: [birth year] Sex: Male         Room/Bed: 4W02C/4W02C-01 Payor Info: Payor: Advertising copywriter / Plan: Advertising copywriter OTHER / Product Type: *No Product type* /    Admit Date/Time:  07/30/2023 12:02 PM  Primary Diagnosis:  S/P BKA (below knee amputation), left [facility]  Hospital Problems: Principal Problem:   S/P BKA (below knee amputation), left (HCC) Active Problems:   Necrotizing fasciitis (HCC)   Coping style affecting medical condition    Expected Discharge Date: Expected Discharge Date: 08/13/23  Team Members Present: Physician leading conference: Dr. Laverle Postin Social Worker Present: [name], LCSW Nurse Present: Forrestine Ike, RN PT Present: Nena Bank, PT OT Present: Tim Fontana, OT     Current Status/Progress Goal Weekly Team Focus  Bowel/Bladder   pt is continent of b/b, did have an incidence of incontinence w/ diarrhea   Pt will remain continent x2   routine toileting q4 hr and prn    Swallow/Nutrition/ Hydration               ADL's   Set up A UB, Min A LB and toileting   Mod I / supervision   Barriers- dynamic standing balance, R knee buckling in stance, deconditioning, BUE muscular atropy with dexterity impairments, cataracts    Mobility   bed mobility supervision, sit<>stands with RW min A, stand<>pivot with RW max A (R knee buckle), squat<>pivot/lateral scoots CGA/min A, WC mobility 169ft supervision. Unable to ambulate at this time   Mod I WC level, CGA standing/ambulation  barriers: low Hemoglobin, weakness/deconditioning, fatigue, decreased balance/coordination    Communication                Safety/Cognition/ Behavioral Observations               Pain   pt comfortable when it comes to pain. Oxy prn recieving about once a day, mainly  before therapy sessions begin   Pt will maintain well controlled pain below 3/10   Routine pain assessments q shift and prn    Skin   L BKA incision w/ bruising, otherwise skin intact   Incision will remain free of infection and skin will remain intact  Routine skin checks q shift and prn      Discharge Planning:  Pt will d/c to home. Pt needs to be as independent as possible due to wife caring for their son who is currnently bed bound. SW will confirm there are no barriers to discharge.   Team Discussion: Patient post left BKA with weakness and fatigue.  Weaning off oxygen by d/c.  Functional progress limited by dynamic standing balance issues and bilateral muscular atrophy  of the hands.  Patient on target to meet rehab goals: yes, currently needs set up assist for upper body care and min assist lower body care and toileting. Able to mobilize up to 150' using a w/c with supervision. Goals for discharge set for mod I overall and CGA for standing.  *See Care Plan and progress notes for [place] and short-term goals.   Revisions to Treatment Plan:  Medications adjusted; Lovenox  discontinued OP EMG referral   Teaching Needs: Safety, residual limb care, skin care, medications, transfers, toileting, etc.   Current Barriers to Discharge: Decreased caregiver support, Home enviroment access/layout, and Weight bearing  restrictions  Possible Resolutions to Barriers: Family education DME: TTB, RW Has a w/c     Medical Summary Current Status: anemia 2/2 acute blood loss, fatigue, type 2 diabetes  Barriers to Discharge: Medical stability  Barriers to Discharge Comments: anemia 2/2 acute blood loss, fatigue, type 2 diabetes Possible Resolutions to [employer] Focus: ortho consulted, monitor Hgb weekly, discussed that bleeding has resolved, lovenox  d/ced, decrease semglee  to 4U, continue monjaro   Continued Need for Acute Rehabilitation Level of Care: The patient requires daily medical  management by a physician with specialized training in physical medicine and rehabilitation for the following reasons: Direction of a multidisciplinary physical rehabilitation program to maximize functional independence : Yes Medical management of patient stability for increased activity during participation in an intensive rehabilitation regime.: Yes Analysis of laboratory values and/or radiology reports with any subsequent need for medication adjustment and/or medical intervention. : Yes   I attest that I was present, lead the team conference, and concur with the assessment and plan of the team.   Forrestine Ike B 08/07/2023, 11:30 AM

## 2023-08-07 NOTE — Progress Notes (Signed)
 Occupational Therapy Session Note  Patient Details  Name: Scott Navarro. MRN: [id number] Date of Birth: 1965/09/14  Today's Date: 08/07/2023 OT Individual Time: 4098-1191 OT Individual Time Calculation (min): 60 min    Short Term Goals: Week 2:  OT Short Term Goal 1 (Week 2): Pt will complete 2/3 toileting steps with CGA for balance OT Short Term Goal 2 (Week 2): Pt will donn shrinker with supervision/cues  Skilled Therapeutic Interventions/Progress Updates:  Pt greeted seated in w/c, pt agreeable to OT intervention.    0/10 pain reported during session.   Transfers/bed mobility/functional mobility: pt completed lateral scoot transfers with supervision.   Therapeutic activity: pt completed standing tolerance/dynamic balance task with pt instructed to stand to complete peg task with a focus on alternating reaching with BUEs to challenge balance. Pt able to stand for 2 mins with CGA- MINA.    Education: spent some time discussing ADL set- up at home. Pt reporting he is to purchase TTB for home and has practiced shower transfers with primary OT. Pt reports he likes the bari [facility] over toilet and plans to purchase bari [facility] for home and can duplicate same set- up at home. Pt reports being able to complete toileting tasks on his own.    Exercises: pt completed below therex to facilitate improved proximal strength for higher level functional mobility tasks:  2 mins of  seated rows with level 3 theraband 30 sec rest break  2 mins of diagonal rows with level 3 theraband 30 sec rest break   2 mins of modified crunches with pt passing ball to therapist once coming into upright position  30 sec rest break  2 mins of russian twists with ball for oblique strengthening 30 sec rest break    2 mins of chest presses with 3 lb dowel rod to challenge reactive balance as pt was seated in front of pt tossing ball to pt 30 sec rest break   2 mins of chest presses with 3lb dowel rod  2 mins of bicep  curls with 3lb dowel rod 2 mins of forward rows with 3lb dowel rod  Ended session with pt seated in w/c with all needs within reach.         Therapy Documentation Precautions:  Precautions Precautions: Fall Recall of Precautions/Restrictions: Intact Precaution/Restrictions Comments: watch BP Restrictions Weight Bearing Restrictions Per Provider Order: Yes LLE Weight Bearing Per Provider Order: Non weight bearing Other Position/Activity Restrictions: LLE limb guard  Pain:no pain     Therapy/Group: Individual Therapy  Willadean Hark 08/07/2023, 12:11 PM

## 2023-08-07 NOTE — Progress Notes (Signed)
 Physical Therapy Weekly Progress Note  Patient Details  Name: Scott Navarro. MRN: [id number] Date of Birth: 1965/10/06  Beginning of progress report period: Jul 31, 2023 End of progress report period: August 07, 2023  Patient has met 2 of 3 short term goals. Pt demonstrates steady progress towards [place] term goals. Pt is currently able to perform bed mobility with supervision/mod I, squat<>pivot and lateral scoot transfers with CGA/close supervision, and sit<>stands with RW and CGA/min A. Pt currently requires max A for stand<>pivots due to difficulty clearing R foot and landing safely. Pt has not been able to ambulate due to difficulty clearing R foot and tendency for R knee to "give out". Pt is able to perform WC mobility 164ft using BUE and supervision/mod I. Pt continues to remain limited by weakness/deconditioning and decreased standing balance/coordination but pain levels remain under control.   Patient continues to demonstrate the following deficits muscle weakness, decreased cardiorespiratoy endurance, and decreased standing balance, decreased postural control, decreased balance strategies, and difficulty maintaining precautions and therefore will continue to benefit from skilled PT intervention to increase functional independence with mobility.  Patient progressing toward [place] term goals..  Continue plan of care.  PT Short Term Goals Week 1:  PT Short Term Goal 1 (Week 1): pt will transfer bed<>chair with LRAD and CGA PT Short Term Goal 1 - Progress (Week 1): Met PT Short Term Goal 2 (Week 1): pt will transfer sit<>stand with LRAD and CGA PT Short Term Goal 2 - Progress (Week 1): Met PT Short Term Goal 3 (Week 1): pt will amblate 18ft with LRAD and mod A PT Short Term Goal 3 - Progress (Week 1): Progressing toward goal Week 2:  PT Short Term Goal 1 (Week 2): STG=LTG due to LOS  Skilled Therapeutic Interventions/Progress Updates:  Ambulation/gait training;Discharge planning;Functional  mobility training;Psychosocial support;Therapeutic Activities;Visual/perceptual remediation/compensation;Balance/vestibular training;Disease management/prevention;Neuromuscular re-education;Skin care/wound management;Therapeutic Exercise;Wheelchair propulsion/positioning;Cognitive remediation/compensation;DME/adaptive equipment instruction;Pain management;Splinting/orthotics;UE/LE Strength taining/ROM;Community reintegration;Stair training;UE/LE Coordination activities   Therapy Documentation Precautions:  Precautions Precautions: Fall Recall of Precautions/Restrictions: Intact Precaution/Restrictions Comments: watch BP Restrictions Weight Bearing Restrictions Per Provider Order: Yes LLE Weight Bearing Per Provider Order: Non weight bearing Other Position/Activity Restrictions: LLE limb guard  Therapy/Group: Individual Therapy [name] Nena Bank PT, DPT 08/07/2023, 6:57 AM

## 2023-08-07 NOTE — Progress Notes (Signed)
 Occupational Therapy Session Note  Patient Details  Name: Scott Navarro. MRN: [id number] Date of Birth: [birth year]  Today's Date: 08/07/2023 OT Individual Time: 1115-1200 OT Individual Time Calculation (min): 45 min    Short Term Goals: Week 1:  OT Short Term Goal 1 (Week 1): pt will demonstrate improved BUE and core strength while completing stand pivot transfer with Min A utilizing RW. OT Short Term Goal 1 - Progress (Week 1): Met OT Short Term Goal 2 (Week 1): Pt will demonstrate improved safety awareness and problem solving while navigating manual WC within personal room and rehab unit with Min A. OT Short Term Goal 2 - Progress (Week 1): Met Week 2:  OT Short Term Goal 1 (Week 2): Pt will complete 2/3 toileting steps with CGA for balance OT Short Term Goal 2 (Week 2): Pt will donn shrinker with supervision/cues  Skilled Therapeutic Interventions/Progress Updates:      Therapy Documentation Precautions:  Precautions Precautions: Fall Recall of Precautions/Restrictions: Intact Precaution/Restrictions Comments: watch BP Restrictions Weight Bearing Restrictions Per Provider Order: Yes LLE Weight Bearing Per Provider Order: Non weight bearing Other Position/Activity Restrictions: LLE limb guard General: "Hi!" Pt seated in W/C upon OT arrival, agreeable to OT. Pt able to propel self to W/C to therapy gym with obstacles within hallway at mod I.   Pain: no pain reported   Exercises: Pt completed the following exercise circuit in order to improve functional activity, strength and endurance to prepare for ADLs such as bathing. Pt completed the following exercises in seated position with no noted LOB/SOB and 3x10 repetitions on each exercise: -bicep curls with 10# dowel rod -triceps extensions with green theraband -forward punches with green theraband -shoulder flexion with 10# dowel rod -rows with 5# dowel rod and resistance of green theraband   Pt seated in W/C at end of  session with W/C alarm donned, call light within reach and 4Ps assessed.    Therapy/Group: Individual Therapy  Nila Barth, OTD, OTR/L 08/07/2023, 12:20 PM

## 2023-08-07 NOTE — Progress Notes (Signed)
 Physical Therapy Session Note  Patient Details  Name: Scott Navarro. MRN: [id number] Date of Birth: 1965/12/20  Today's Date: 08/07/2023 PT Individual Time: [phone] and 1300-1359 PT Individual Time Calculation (min): 71 min and 59 min  Short Term Goals: Week 1:  PT Short Term Goal 1 (Week 1): pt will transfer bed<>chair with LRAD and CGA PT Short Term Goal 1 - Progress (Week 1): Met PT Short Term Goal 2 (Week 1): pt will transfer sit<>stand with LRAD and CGA PT Short Term Goal 2 - Progress (Week 1): Met PT Short Term Goal 3 (Week 1): pt will amblate 16ft with LRAD and mod A PT Short Term Goal 3 - Progress (Week 1): Progressing toward goal Week 2:  PT Short Term Goal 1 (Week 2): STG=LTG due to LOS  Skilled Therapeutic Interventions/Progress Updates:  Treatment Session 1 Received pt sitting in [facility] ready for therapy. Pt agreeable to PT treatment and denied any pain during session. Session with emphasis on functional mobility/transfers, generalized strengthening and endurance, dynamic standing balance/coordination. Pt able to don/doff limb guard mod I throughout session. Removed 4XL shrinker from yesterday independently while therapist washed old shrinker. Incision appears to be healing well with less drainage coming from L lateral aspect of distal limb. Donned 3XL shrinker with total A and then pt hand washed other 4XL shrinker with setup assist - reviewed shrinker wear/care instructions while pt washed shrinker.   Pt performed WC mobility 140ft x 2 trials to/from gym using BUE and supervision/mod I with emphasis on UE strength/coordination. Stood in // bars with CGA multiple trials and worked on R single leg heel raises 2x10 and 1x8 with light guarding at R knee in case of buckling in preparation for "hopping". Transitioned to boosting up with BUEs to clear R foot - pt able to perform x7 lifting R foot up for ~1-2 seconds, however when landing, R foot getting tucked behind him resulting in LOB  and R knee appears extremely unstable and weak.  Pt then performed seated tricep push ups in // bars 2x10 progressing to 2x10 with 2 second hold at top with emphasis on UE strength/endurance. Pt able to set up transfer to/from mat with supervision (including managing legrests and flip back armrest) and performed lateral scoot on/off mat with supervision. Pt performed seated hip abduction with blue TB 2x20 with 3 second hold with emphasis on hip abductor strengthening.   Discussed avoid "hopping" at home due to fall and safety concerns and avoid excessive strain on R knee - pt verbalized understanding and in agreement. Pt with questions regarding using rollator. Located rollator to demonstrate to pt safety concerns and why it is not recommended (4 wheels that move very quickly, inability to get all the way inside rollator, and that rollator can move even when brakes are locked). Returned to room and concluded session with pt sitting in [facility] with all needs within reach.    Treatment Session 2 Received pt sitting in WC, pt agreeable to PT treatment, and denied any pain during session. Session with emphasis on functional mobility/transfers, generalized strengthening and endurance, dynamic standing balance/coordination. Pt performed WC mobility 176ft using BUE and supervision to dayroom with emphasis on UE strength/coordination.   Pt performed seated RLE strengthening on Kinetron at 20 cm/sec for 3 minutes with therapist providing manual counter resistance with emphasis on glute/quad strength. Transferred on/off mat via lateral scoot with supervision (pt able to set up transfer mod I including removing legrests, armrests, and positioning WC). Stood  from EOM with RW and light min A and worked on dynamic standing balance/coordination tossing beanbags x 3 trials with heavy min A for balance. Attempted to match cards on mirror but pt unable due to poor grip strength and muscle atrophy in hands. While tossing  horseshoes, pt with multiple lateral LOB requiring assist to correct and with difficultly positioning R foot. Scott from [place] arrived with 2XL shrinkers.  Pt performed the following seated exercises sitting on disc to engage core with emphasis on UE strength/ROM: -overhead press<>horizontal chest press with 4lb medicine ball 2x10 each -lateral trunk rotations with 4lb medicine ball 2x10 bilaterally -horizontal static hold with tidal tank 3x30 seconds  -shoulder extensions into physioball 2x10 with 10 second isometric hold Transported back to room in [facility] dependently and concluded session with pt sitting in WC with all needs within reach.   Therapy Documentation Precautions:  Precautions Precautions: Fall Recall of Precautions/Restrictions: Intact Precaution/Restrictions Comments: watch BP Restrictions Weight Bearing Restrictions Per Provider Order: Yes LLE Weight Bearing Per Provider Order: Non weight bearing Other Position/Activity Restrictions: LLE limb guard  Therapy/Group: Individual Therapy [name] Nena Bank PT, DPT 08/07/2023, 6:59 AM

## 2023-08-07 NOTE — Progress Notes (Signed)
 PROGRESS NOTE   Subjective/Complaints: No new complaints this morning Discussed his success with the freestyle libre and Mounjaro  Hgb reviewed and is steady  ROS: pain is well controlled, moving bowels regularly, +muscle atrophy   Objective:   No results found. Recent Labs    08/07/23 0528  WBC 9.3  HGB 8.2*  HCT 24.2*  PLT 374   Recent Labs    08/07/23 0528  NA 136  K 4.4  CL 102  CO2 29  GLUCOSE 103*  BUN 42*  CREATININE 1.20  CALCIUM  8.1*     Intake/Output Summary (Last 24 hours) at 08/07/2023 1201 Last data filed at 08/07/2023 0747 Gross per 24 hour  Intake 480 ml  Output 100 ml  Net 380 ml        Physical Exam: Vital Signs Blood pressure 138/75, pulse 95, temperature 98.7 F (37.1 C), temperature source Oral, resp. rate 18, height 6' (1.829 m), SpO2 99%. Gen: no distress, normal appearing.  Sitting up in bed. HEENT: oral mucosa pink and moist, NCAT Cardiovascular:     Rate and Rhythm: Normal rate and regular rhythm.     Pulses: Normal pulses.     Heart sounds: Murmur heard.   Pulmonary:     Effort: Pulmonary effort is normal. No respiratory distress.     Breath sounds: Normal breath sounds. No wheezing or rhonchi.  Abdominal:     General: Abdomen is protuberant. Bowel sounds are normal. There is no distension.     Palpations: Abdomen is soft.     Tenderness: There is no abdominal tenderness. There is no guarding or rebound.   Musculoskeletal:        General: Normal range of motion.     Cervical back: Normal range of motion.     Right lower leg: Normal. No swelling. No edema.     Comments: Left BKA-wrapped under dry dressing--full active range of motion in knee extension.  Limb guard not being used due to being covered in blood.  Skin: Small bruise on left BKA and lateral side as below; otherwise skin well-approximated and sutures intact.  Bleeding from residual limb has reduced  significantly    Neurological:     Mental Status: He is alert and oriented to person, place, and time. Mental status is at baseline.     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        Behavior: Behavior normal.        Thought Content: Thought content normal.        Judgment: Judgment normal.     Assessment/Plan: 1. Functional deficits which require 3+ hours per day of interdisciplinary therapy in a comprehensive inpatient rehab setting. Physiatrist is providing close team supervision and 24 hour management of active medical problems listed below. Physiatrist and rehab team continue to assess barriers to discharge/monitor patient  progress toward functional and medical goals  Care Tool:  Bathing    Body parts bathed by patient: Right arm, Left arm, Chest, Abdomen, Front perineal area, Buttocks, Face   Body parts bathed by helper: Left upper leg, Right upper leg, Right lower leg Body parts n/a: Left lower leg   Bathing assist Assist Level: Moderate Assistance - Patient 50 - 74%     Upper Body Dressing/Undressing Upper body dressing   What is the patient wearing?: Pull over shirt    Upper body assist Assist Level: Supervision/Verbal cueing    Lower Body Dressing/Undressing Lower body dressing      What is the patient wearing?: Underwear/pull up, Pants     Lower body assist Assist for lower body dressing: Minimal Assistance - Patient > 75%     Toileting Toileting    Toileting assist Assist for toileting: Minimal Assistance - Patient > 75%     Transfers Chair/bed transfer  Transfers assist     Chair/bed transfer assist level: Contact Guard/Touching assist      Locomotion Ambulation   Ambulation assist   Ambulation activity did not occur: Safety/medical concerns (decreased balance, fatigue, weakness)          Walk 10 feet activity   Assist  Walk 10 feet activity did not occur: Safety/medical concerns (decreased balance, fatigue, weakness)        Walk 50 feet activity   Assist Walk 50 feet with 2 turns activity did not occur: Safety/medical concerns (decreased balance, fatigue, weakness)         Walk 150 feet activity   Assist Walk 150 feet activity did not occur: Safety/medical concerns (decreased balance, fatigue, weakness)         Walk 10 feet on uneven surface  activity   Assist Walk 10 feet on uneven surfaces activity did not occur: Safety/medical concerns (decreased balance, fatigue, weakness)         Wheelchair     Assist Is the patient using a wheelchair?: Yes Type of Wheelchair: Manual    Wheelchair assist level: Supervision/Verbal cueing Max wheelchair distance: 150    Wheelchair 50 feet with 2 turns activity    Assist        Assist Level: Supervision/Verbal cueing   Wheelchair 150 feet activity     Assist      Assist Level: Supervision/Verbal cueing   Blood pressure 138/75, pulse 95, temperature 98.7 F (37.1 C), temperature source Oral, resp. rate 18, height 6' (1.829 m), SpO2 99%.  Medical Problem List and Plan: 1. 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             -patient may not shower             -ELOS/Goals: 12-14 days, PT/OT sup             -Continue CIR             -Dr Julio Ohm note 5/26 indicated that wound VAC can be removed in about a week  Shrinker and limb guard ordered  Continue dry dressing changes  Wound assessed and picture taken for chart   Discussed discharge date and he is content with this  2. Impaired mobility: d/ced Lovenox  due to bleeding as per ortho recs            3. Pain Management: continue oxycodone  5-10 mg  PRN.              -monitor for breakthrough             -  tylenol  PRN   4. Mood/Behavior/Sleep: LCSW to follow for evaluation and support.             -antipsychotic agents: N/A             -order for Trazadone prn          5. Neuropsych/cognition: This patient is capable of making decisions on his own behalf.  6. Skin: wound vac removed              - continue zinc  and vitamin C              -Prevalon Boot ordered   7. Fluids/Electrolytes/Nutrition: monitor I/O, check CMET              -encourage fluids(AKI)              continue Juven supplements/ Diet: Carb modified    8. TDM2: Hemoglobin A1c 6.7 (stable) - Monitor B/S use SSI for elevated blood sugar--current BS rang 140-160s -Continue Mounjaro  15 mg subcutaneous injection Friday (nonformulary requested/wife to bring from home). D/c HS ISS, d/c daytime ISS, decrease semglee  to 4U, advised to refuse CBG testing if performed during or after meal- should only be checked before meals Recent Labs    08/06/23 1712 08/06/23 2108 08/07/23 0538  GLUCAP 107* 150* 102*     9. HLD: continue Crestor  20 mg daily/pm  10.  HTN/Orthostatic hypotension: Monitor BP's TID -Valsartan  being held (RX note) -check orthostatic VS -reviewed medications and is not on any anti-hypertensives - Blood pressure well-controlled    08/07/2023    5:32 AM 08/06/2023    7:44 PM 08/06/2023    4:13 PM  Vitals with BMI  Systolic 138 136 161  Diastolic 75 71 68  Pulse 95 97 100    11. Anemia: discussed that Hgb has improved to 8.2, monitor weekly  12.  Prolonged Qtc:             - Avoid QTc prolonging medications, monitor electrolytes  13.  AKI:  Cr reviewed and has improved  14.  Distal muscle atrophy and weakness.  Possible neuropathy related.  Consider outpatient EMG/nerve conduction study  15. Constipation: encouraged fruits and vegetables, continue senna prn HS, last BM 6/4      LOS: 8 days A FACE TO FACE EVALUATION WAS PERFORMED  Jerusalen Mateja P  Crucita Lacorte 08/07/2023, 12:01 PM

## 2023-08-07 NOTE — Progress Notes (Signed)
 Orthopedic Tech Progress Note Patient Details:  Scott Navarro. 12/11/1965 [id number] Called in BK shrinker to [place] Patient ID: Scott Life., male   DOB: 31-Aug-1965, 58 y.o.   MRN: [id number]  Scott Navarro 08/07/2023, 10:28 AM

## 2023-08-08 LAB — GLUCOSE, CAPILLARY
Glucose-Capillary: 111 mg/dL — ABNORMAL HIGH (ref 70–99)
Glucose-Capillary: 122 mg/dL — ABNORMAL HIGH (ref 70–99)
Glucose-Capillary: 111 mg/dL — ABNORMAL HIGH (ref 70–99)
Glucose-Capillary: 158 mg/dL — ABNORMAL HIGH (ref 70–99)

## 2023-08-08 MED ORDER — MAGNESIUM GLUCONATE 500 (27 MG) MG PO TABS
250.0000 mg | ORAL_TABLET | Freq: Every day | ORAL | Status: DC
  Administered 2023-08-08 – 2023-08-11 (×4): 250 mg via ORAL
  Filled 2023-08-08 (×4): qty 1

## 2023-08-08 MED ORDER — INSULIN GLARGINE-YFGN 100 UNIT/ML ~~LOC~~ SOLN
3.0000 [IU] | SUBCUTANEOUS | Status: DC
  Administered 2023-08-08 – 2023-08-10 (×3): 3 [IU] via SUBCUTANEOUS
  Filled 2023-08-08 (×4): qty 0.03

## 2023-08-08 NOTE — Progress Notes (Signed)
 Nutrition Follow-up  DOCUMENTATION CODES:   Not applicable  INTERVENTION:   Continue Prosource Plus 30 ml PO BID, each packet provides 100 kcal and 15 gm protein. Continue HS snack daily. Continue MVI with minerals daily. Diabetes & wound healing education added to discharge summary.  Continue Juven 1 packet PO BID, each packet provides 80 calories, 8 grams of carbohydrate, 2.5  grams of protein (collagen), 7 grams of L-arginine and 7 grams of L-glutamine; supplement contains CaHMB, Vitamins C, E, B12 and Zinc  to promote wound healing. Continue vitamin C  1,000 mg daily to support wound healing. Continue zinc  sulfate 220 mg daily x 14 days total to support wound healing. Zinc  supplementation for longer than 14 days can cause copper deficiency. Obtain new wt  NUTRITION DIAGNOSIS:   Increased nutrient needs related to post-op healing, wound healing as evidenced by estimated needs.  Ongoing  GOAL:   Patient will meet greater than or equal to 90% of their needs  Progressing   MONITOR:   PO intake, Supplement acceptance, Skin  REASON FOR ASSESSMENT:   Consult Wound healing, Diet education  ASSESSMENT:   58 yo male admitted with functional deficits secondary to L BKA d/t necrotizing fasciitis. PMH includes DM-2, R great toe amputation.  5/30- wound vac removed and transitioned to compression wrap and trunk shrinker  Reviewed I/O's: +719 ml x 24 hours and +5.8 L since admission  Pt unavailable at time of visit. Attempted to speak with pt via call to hospital room phone, however, unable to reach.   Case discussed with RN, who reports pt is doing very well. He has a great appetite and is drinking his supplements. Noted meal completions 100%.   No new wt since admission.   Per social work notes, anticipated discharge on 08/13/23; family education scheduled for 08/11/23.   Medications reviewed and include vitamin C , vitamin B-12, MVI, and zinc  sulfate.   Labs reviewed: CBGS:  102-150 (inpatient orders for glycemic control are 3 units insulin  glargine-yfgn daily).  Noted plans for CGM and mounjaro  at home.   Diet Order:   Diet Order             Diet Carb Modified Fluid consistency: Thin; Room service appropriate? Yes  Diet effective now                   EDUCATION NEEDS:   Education needs have been addressed  Skin:  Skin Assessment: Skin Integrity Issues: Skin Integrity Issues:: Incisions Wound Vac: d/c on 08/01/23 Incisions: lt BKA (compression wrap)  Last BM:  08/06/23  Height:   Ht Readings from Last 1 Encounters:  08/06/23 6' (1.829 m)    Weight:   Wt Readings from Last 1 Encounters:  07/25/23 84.8 kg    Ideal Body Weight:  75.7 kg  BMI:  Body mass index is 25.36 kg/m.  Estimated Nutritional Needs:   Kcal:  2200-2400  Protein:  110-125 gm  Fluid:  2.2-2.4 L    Herschel Lords, RD, LDN, CDCES Registered Dietitian III Certified Diabetes Care and Education Specialist If unable to reach this RD, please use "RD Inpatient" group chat on secure chat between hours of 8am-4 pm daily

## 2023-08-08 NOTE — Progress Notes (Signed)
 Physical Therapy Session Note  Patient Details  Name: Scott Navarro. MRN: [id number] Date of Birth: [birth year]  Today's Date: 08/08/2023 PT Individual Time: 1000-1040 PT Individual Time Calculation (min): 40 min   Short Term Goals: Week 1:  PT Short Term Goal 1 (Week 1): pt will transfer bed<>chair with LRAD and CGA PT Short Term Goal 1 - Progress (Week 1): Met PT Short Term Goal 2 (Week 1): pt will transfer sit<>stand with LRAD and CGA PT Short Term Goal 2 - Progress (Week 1): Met PT Short Term Goal 3 (Week 1): pt will amblate 50ft with LRAD and mod A PT Short Term Goal 3 - Progress (Week 1): Progressing toward goal Week 2:  PT Short Term Goal 1 (Week 2): STG=LTG due to LOS  Skilled Therapeutic Interventions/Progress Updates:   Received pt sitting in [facility] ready for therapy. Pt agreeable to PT treatment and did not c/o pain during session. Session with emphasis on functional mobility/transfers, generalized strengthening and endurance, dynamic standing balance/coordination. Pt transported to/from room in [facility] dependently for time management purposes. Pt able to set up transfer to/from mat with supervision/mod I including managing legrests, armrests, and positioning WC - performed bicep curls 2x12 bilaterally with 6lb dumbbell with emphasis on UE strength while therapist set up next activity. Pt then performed modified crunches from wedge 2x10, requiring UE assist on EOM.  Stood from low sitting EOM with RW and CGA and performed alternating UE raises 2x20 bilaterally with light min A for balance. Trial 1: looking down and trial 2: encouraged pt to look straight ahead. Stood x 2 additional trials and worked on [place] with rehab tech with min A for balance 2x20 with RUE and 2x10 with LUE from various heights, angles, and directions to challenge balance, proprioception, and reaction time. Pt with increased difficulty letting go with LUE>RUE resulting in  more frequent LOB.Returned to room and concluded session with pt sitting in [facility] with all needs within reach.  Therapy Documentation Precautions:  Precautions Precautions: Fall Recall of Precautions/Restrictions: Intact Precaution/Restrictions Comments: watch BP Restrictions Weight Bearing Restrictions Per Provider Order: Yes LLE Weight Bearing Per Provider Order: Non weight bearing Other Position/Activity Restrictions: LLE limb guard  Therapy/Group: Individual Therapy [name] Nena Bank PT, DPT 08/08/2023, 6:51 AM

## 2023-08-08 NOTE — Progress Notes (Signed)
 Physical Therapy Group Session Note  Patient Details  Name: Scott Navarro. MRN: [id number] Date of Birth: 1965/07/23  Today's Date: 08/08/2023 PT Group Time: 1300-1400 PT Group Time Calculation (min): 60 min  Short Term Goals: Week 1:  PT Short Term Goal 1 (Week 1): pt will transfer bed<>chair with LRAD and CGA PT Short Term Goal 1 - Progress (Week 1): Met PT Short Term Goal 2 (Week 1): pt will transfer sit<>stand with LRAD and CGA PT Short Term Goal 2 - Progress (Week 1): Met PT Short Term Goal 3 (Week 1): pt will amblate 37ft with LRAD and mod A PT Short Term Goal 3 - Progress (Week 1): Progressing toward goal Week 2:  PT Short Term Goal 1 (Week 2): STG=LTG due to LOS  Skilled Therapeutic Interventions/Progress Updates:   Pt participated in limb loss group in social setting with emphasis on stress management and coping strategies to manage new diagnosis to allow for improved mental health to improve overall quality of life. Provided active listening, emotional support and therapeutic use of self. Education provided on pain management including desensitization techniques and mirror therapy, and prosthetic fitting timeline. Issued and reviewed handouts on balanced amputee wellness and educational communities, amputee support group of the triad, contracture prevention, limb protector, and shrinker wear/care guidelines. Also discussed activity modification in relation to pt's goals and hobbies. Pt actively engaged throughout and appreciative of group session.   Therapy Documentation Precautions:  Precautions Precautions: Fall Recall of Precautions/Restrictions: Intact Precaution/Restrictions Comments: watch BP Restrictions Weight Bearing Restrictions Per Provider Order: Yes LLE Weight Bearing Per Provider Order: Non weight bearing Other Position/Activity Restrictions: LLE limb guard   Therapy/Group: Group Therapy [name] Nena Bank PT, DPT 08/08/2023, 10:42 AM

## 2023-08-08 NOTE — Progress Notes (Signed)
 PROGRESS NOTE   Subjective/Complaints: No new complaints this morning Working with therapy Working on hip abduction exercises with left hip  ROS: pain is well controlled, moving bowels regularly, +muscle atrophy, lower extremity strength is improving   Objective:   No results found. Recent Labs    08/07/23 0528  WBC 9.3  HGB 8.2*  HCT 24.2*  PLT 374   Recent Labs    08/07/23 0528  NA 136  K 4.4  CL 102  CO2 29  GLUCOSE 103*  BUN 42*  CREATININE 1.20  CALCIUM  8.1*     Intake/Output Summary (Last 24 hours) at 08/08/2023 1212 Last data filed at 08/08/2023 0717 Gross per 24 hour  Intake 716 ml  Output 1 ml  Net 715 ml        Physical Exam: Vital Signs Blood pressure 137/85, pulse (!) 102, temperature 98 F (36.7 C), temperature source Oral, resp. rate 18, height 6' (1.829 m), SpO2 100%. Gen: no distress, normal appearing.  Sitting up in bed. HEENT: oral mucosa pink and moist, NCAT Cardiovascular:     Tachycardic    Pulses: Normal pulses.     Heart sounds: Murmur heard.   Pulmonary:     Effort: Pulmonary effort is normal. No respiratory distress.     Breath sounds: Normal breath sounds. No wheezing or rhonchi.  Abdominal:     General: Abdomen is protuberant. Bowel sounds are normal. There is no distension.     Palpations: Abdomen is soft.     Tenderness: There is no abdominal tenderness. There is no guarding or rebound.   Musculoskeletal:        General: Normal range of motion.     Cervical back: Normal range of motion.     Right lower leg: Normal. No swelling. No edema.     Comments: Left BKA-wrapped under dry dressing--full active range of motion in knee extension.  Limb guard not being used due to being covered in blood.  Skin: Small bruise on left BKA and lateral side as below; otherwise skin well-approximated and sutures intact.  Bleeding from residual limb has reduced  significantly    Neurological:     Mental Status: He is alert and oriented to person, place, and time. Mental status is at baseline.     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        Behavior: Behavior normal.        Thought Content: Thought content normal.        Judgment: Judgment normal.     Assessment/Plan: 1. Functional deficits which require 3+ hours per day of interdisciplinary therapy in a comprehensive inpatient rehab setting. Physiatrist is providing close team supervision and 24 hour management of active medical problems listed below. Physiatrist and rehab team continue to assess barriers to discharge/monitor patient progress toward functional and medical  goals  Care Tool:  Bathing    Body parts bathed by patient: Right arm, Left arm, Chest, Abdomen, Front perineal area, Buttocks, Face   Body parts bathed by helper: Left upper leg, Right upper leg, Right lower leg Body parts n/a: Left lower leg   Bathing assist Assist Level: Moderate Assistance - Patient 50 - 74%     Upper Body Dressing/Undressing Upper body dressing   What is the patient wearing?: Pull over shirt    Upper body assist Assist Level: Supervision/Verbal cueing    Lower Body Dressing/Undressing Lower body dressing      What is the patient wearing?: Underwear/pull up, Pants     Lower body assist Assist for lower body dressing: Minimal Assistance - Patient > 75%     Toileting Toileting    Toileting assist Assist for toileting: Minimal Assistance - Patient > 75%     Transfers Chair/bed transfer  Transfers assist     Chair/bed transfer assist level: Supervision/Verbal cueing      Locomotion Ambulation   Ambulation assist   Ambulation activity did not occur: Safety/medical concerns (decreased balance, fatigue, weakness)          Walk 10 feet activity   Assist  Walk 10 feet activity did not occur: Safety/medical concerns (decreased balance, fatigue, weakness)        Walk 50 feet activity   Assist Walk 50 feet with 2 turns activity did not occur: Safety/medical concerns (decreased balance, fatigue, weakness)         Walk 150 feet activity   Assist Walk 150 feet activity did not occur: Safety/medical concerns (decreased balance, fatigue, weakness)         Walk 10 feet on uneven surface  activity   Assist Walk 10 feet on uneven surfaces activity did not occur: Safety/medical concerns (decreased balance, fatigue, weakness)         Wheelchair     Assist Is the patient using a wheelchair?: Yes Type of Wheelchair: Manual    Wheelchair assist level: Supervision/Verbal cueing Max wheelchair distance: 150    Wheelchair 50 feet with 2 turns activity    Assist        Assist Level: Supervision/Verbal cueing   Wheelchair 150 feet activity     Assist      Assist Level: Supervision/Verbal cueing   Blood pressure 137/85, pulse (!) 102, temperature 98 F (36.7 C), temperature source Oral, resp. rate 18, height 6' (1.829 m), SpO2 100%.  Medical Problem List and Plan: 1. 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             -patient may not shower             -ELOS/Goals: 12-14 days, PT/OT sup             -Continue CIR             -Dr Julio Ohm note 5/26 indicated that wound VAC can be removed in about a week  Shrinker and limb guard ordered  Continue dry dressing changes  Wound assessed and picture taken for chart   Discussed discharge date and he is content with this  Therapy notes reviewed and he is CG with RW for sit to stands  2. Impaired mobility: d/ced Lovenox  due to bleeding as per  ortho recs            3. Pain Management: continue oxycodone  5-10 mg PRN.              -  monitor for breakthrough             -tylenol  PRN   4. Tachycardia: magnesium  gluconate 250mg  ordered       5. Neuropsych/cognition: This patient is capable of making decisions on his own behalf.  6. Skin: wound vac removed              - continue zinc  and vitamin C              -Prevalon Boot ordered   7. Fluids/Electrolytes/Nutrition: monitor I/O, check CMET              -encourage fluids(AKI)              continue Juven supplements/ Diet: Carb modified    8. TDM2: Hemoglobin A1c 6.7 (stable) - Monitor B/S use SSI for elevated blood sugar--current BS rang 140-160s -Continue Mounjaro  15 mg subcutaneous injection Friday (nonformulary requested/wife to bring from home). D/c HS ISS, d/c daytime ISS, decrease semglee  to 3U, advised to refuse CBG testing if performed during or after meal- should only be checked before meals Recent Labs    08/07/23 1651 08/07/23 2024 08/08/23 0617  GLUCAP 123* 134* 111*     9. HLD: continue Crestor  20 mg daily/pm  10.  HTN/Orthostatic hypotension: Monitor BP's TID -Valsartan  being held (RX note) -check orthostatic VS -reviewed medications and is not on any anti-hypertensives - Blood pressure well-controlled    08/08/2023    5:22 AM 08/07/2023    7:30 PM 08/07/2023    3:46 PM  Vitals with BMI  Systolic 137 136 161  Diastolic 85 77 80  Pulse 102 97 101    11. Anemia: discussed that Hgb has improved to 8.2, monitor weekly  12.  Prolonged Qtc:             - Avoid QTc prolonging medications, monitor electrolytes  13.  AKI:  Cr reviewed and has improved  14.  Distal muscle atrophy and weakness.  Possible neuropathy related.  Consider outpatient EMG/nerve conduction study  15. Constipation: encouraged fruits and vegetables, continue senna prn HS, last BM 6/4, add magnesium  gluconate 250mg  daily      LOS: 9 days A FACE TO FACE EVALUATION WAS  PERFORMED  Scott Navarro P Scott Navarro 08/08/2023, 12:12 PM

## 2023-08-08 NOTE — Progress Notes (Signed)
 Occupational Therapy Session Note  Patient Details  Name: Scott Navarro. MRN: [id number] Date of Birth: 1965-11-16  Today's Date: 08/08/2023 OT Individual Time: 7829-5621 & 1105-1200 OT Individual Time Calculation (min): 42 min & 55 min   Short Term Goals: Week 2:  OT Short Term Goal 1 (Week 2): Pt will complete 2/3 toileting steps with CGA for balance OT Short Term Goal 2 (Week 2): Pt will donn shrinker with supervision/cues  Skilled Therapeutic Interventions/Progress Updates:  Session 1 Skilled OT intervention completed with focus on limb care education, sit > stands. Pt received seated in w/c, agreeable to session. No pain reported.  Pt requested assist to open his new shrinker bag for limb care. L BKA noted to have active bleed with residue on towel on amputee support pad, with pt unaware. Doffed shrinker with mod I- incision noted to small spot with small bleed otherwise intact and healing well. Education provided on stretching of new shrinker prior to donning, as well as anchor technique for 2 person assist to minimize pain and shearing of incision with donning. Mod cues needed for hand placement then mod A to donn 3X shrinker on. Encouraged pt to inspect his foam pad in limb guard due to leakage on towel- with pt able to identify soilage on foam. Switched out foam piece for new clean one independently, then washed soiled shrinker and foam piece with mod I at sink- cues for removing crusty residue for infection prevention then laid to dry.  Pt self-propelled in w/c <> gym with mod I. Completed CGA sit > stand using RW. Had pt alternate lifting either UE from RW for simulated LB dressing task. Pt with R lateral lean onto RW when lifting R hand, with pt indicating he does this for the "walker to stabilize" him. OT discussed with pt the instability of a RW, and importance of neutral hip alignment not just for balance, but for preventing strain on glute/hip abductors and compensatory hip dip  in stance. Stood again with CGA/min A using RW with cues provided for balancing a full cup of water on each hip, with pt able to release RUE from RW x5 with improved alignment.  Back in room, pt remained seated in w/c, with chair alarm on/activated, and with all needs in reach at end of session.  Session 2 Skilled OT intervention completed with focus on functional transfers, contracture prevention, residual limb exercises. Pt received seated in w/c, agreeable to session. No pain reported.  Self-propelled in w/c <> gym mod I. Able to position w/c next to EOM and remove leg rests with supervision. Supervision lateral scoot > EOM. Doffed limb guard with mod I. Cues needed to inspect foam sponge for bleeding check- none observed. Transitioned to supine with mod I. Pillow/wedge provided for comfort.  Pt completed the following exercises to address L residual limb strengthening and contracture prevention needed for proper wear and biomechanics of a prosthetic: Supine -knee extension with limb on foam block for greater extension, 5 sec hold x10 -RLE single leg bridge x15 R sidelying -hip abduction x20 -Circumduction clockwise x15 Prone -cobra stretch on elbows for hip extension, 5 sec hold x15 -residual limb knee flexion& extension x10 -L straight leg raise x30  Transitioned supine > EOM with mod I, redonned limb guard mod I. Supervision lateral scoot > w/c. Back in room, pt remained seated, with chair alarm on/activated, and with all needs in reach at end of session.   Therapy Documentation Precautions:  Precautions Precautions: Fall  Recall of Precautions/Restrictions: Intact Precaution/Restrictions Comments: watch BP Restrictions Weight Bearing Restrictions Per Provider Order: Yes LLE Weight Bearing Per Provider Order: Non weight bearing Other Position/Activity Restrictions: LLE limb guard    Therapy/Group: Individual Therapy  Ruthanna Covert, MS, OTR/L  08/08/2023, 12:05 PM

## 2023-08-09 LAB — GLUCOSE, CAPILLARY
Glucose-Capillary: 117 mg/dL — ABNORMAL HIGH (ref 70–99)
Glucose-Capillary: 117 mg/dL — ABNORMAL HIGH (ref 70–99)
Glucose-Capillary: 109 mg/dL — ABNORMAL HIGH (ref 70–99)

## 2023-08-09 LAB — OCCULT BLOOD X 1 CARD TO LAB, STOOL: Fecal Occult Bld: NEGATIVE

## 2023-08-09 NOTE — Plan of Care (Signed)
  Problem: Consults Goal: RH LIMB LOSS PATIENT EDUCATION Description: Description: See Patient Education module for eduction specifics. Outcome: Progressing   Problem: RH BOWEL ELIMINATION Goal: RH STG MANAGE BOWEL WITH ASSISTANCE Description: STG Manage Bowel with supervision  Assistance. Outcome: Progressing   Problem: RH BLADDER ELIMINATION Goal: RH STG MANAGE BLADDER WITH ASSISTANCE Description: STG Manage Bladder With supervision  Assistance Outcome: Progressing   Problem: RH SKIN INTEGRITY Goal: RH STG SKIN FREE OF INFECTION/BREAKDOWN Description: Manage skin free of infection/breakdown with supervision assistance Outcome: Progressing   Problem: RH SAFETY Goal: RH STG ADHERE TO SAFETY PRECAUTIONS W/ASSISTANCE/DEVICE Description: STG Adhere to Safety Precautions With Assistance/Device. Outcome: Progressing   Problem: RH PAIN MANAGEMENT Goal: RH STG PAIN MANAGED AT OR BELOW PT'S PAIN GOAL Description: <4 w/ prns Outcome: Progressing   Problem: RH KNOWLEDGE DEFICIT LIMB LOSS Goal: RH STG INCREASE KNOWLEDGE OF SELF CARE AFTER LIMB LOSS Description: Manage knowledge deficit Limb loss with supervision assistance from spouse using educational materials provided Outcome: Progressing

## 2023-08-09 NOTE — Progress Notes (Signed)
 Restless night. On first rounds Blood noted on patient's sheets. Removed stump shrinker, blood oozing from a couple areas, of middle part of incision. At 2030, Cleansed area, 4x4's, abd, kerlix, and ace wrap applied. Made Mercedes aware of bloody drainage. At 0145, drainage noted on ace wrap, cleansed and redressed. Elevated on 2 pillows. Wore prevalon to right foot intermittently . Cont. BM during night, requested and given imodium  at 0354. Nini Cavan A

## 2023-08-09 NOTE — Progress Notes (Signed)
 PROGRESS NOTE   Subjective/Complaints:  Had some bleeding from R stump last noc, no pain c/o, wonders if he did something in PT ROS: pain is well controlled, moving bowels regularly, +muscle atrophy, lower extremity strength is improving   Objective:   No results found. Recent Labs    08/07/23 0528  WBC 9.3  HGB 8.2*  HCT 24.2*  PLT 374   Recent Labs    08/07/23 0528  NA 136  K 4.4  CL 102  CO2 29  GLUCOSE 103*  BUN 42*  CREATININE 1.20  CALCIUM  8.1*     Intake/Output Summary (Last 24 hours) at 08/09/2023 1037 Last data filed at 08/09/2023 0125 Gross per 24 hour  Intake 596 ml  Output 600 ml  Net -4 ml        Physical Exam: Vital Signs Blood pressure 139/81, pulse (!) 102, temperature 97.9 F (36.6 C), temperature source Oral, resp. rate 18, height 6' (1.829 m), weight 71.4 kg, SpO2 100%.  General: No acute distress Mood and affect are appropriate Heart: Regular rate and rhythm no rubs murmurs or extra sounds Lungs: Clear to auscultation, breathing unlabored, no rales or wheezes Abdomen: Positive bowel sounds, soft nontender to palpation, nondistended Extremities: No clubbing, cyanosis, or edema Comments: Left BKA-see below, no active bleeding, small amt blood on 4x 4 and very small amt on ABD 08/02/23   08/09/23  Neurological:     Mental Status: He is alert and oriented to person, place, and time. Mental status is at baseline.     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        Behavior: Behavior normal.        Thought Content: Thought content normal.        Judgment: Judgment normal.     Assessment/Plan: 1. Functional deficits which require 3+ hours per day of interdisciplinary therapy in a comprehensive  inpatient rehab setting. Physiatrist is providing close team supervision and 24 hour management of active medical problems listed below. Physiatrist and rehab team continue to assess barriers to discharge/monitor patient progress toward functional and medical goals  Care Tool:  Bathing    Body parts bathed by patient: Right arm, Left arm, Chest, Abdomen, Front perineal area, Buttocks, Face   Body parts bathed by helper: Left upper leg, Right upper leg, Right lower leg Body parts n/a: Left lower leg   Bathing assist Assist Level: Moderate Assistance - Patient 50 - 74%     Upper Body Dressing/Undressing Upper body dressing   What is the patient wearing?: Pull over shirt    Upper body assist Assist Level: Supervision/Verbal cueing    Lower Body Dressing/Undressing Lower body dressing      What is the patient wearing?: Underwear/pull up, Pants     Lower body assist Assist for lower body dressing: Minimal Assistance - Patient > 75%     Toileting Toileting    Toileting assist Assist for toileting: Minimal  Assistance - Patient > 75%     Transfers Chair/bed transfer  Transfers assist     Chair/bed transfer assist level: Supervision/Verbal cueing     Locomotion Ambulation   Ambulation assist   Ambulation activity did not occur: Safety/medical concerns (decreased balance, fatigue, weakness)          Walk 10 feet activity   Assist  Walk 10 feet activity did not occur: Safety/medical concerns (decreased balance, fatigue, weakness)        Walk 50 feet activity   Assist Walk 50 feet with 2 turns activity did not occur: Safety/medical concerns (decreased balance, fatigue, weakness)         Walk 150 feet activity   Assist Walk 150 feet activity did not occur: Safety/medical concerns (decreased balance, fatigue, weakness)         Walk 10 feet on uneven surface  activity   Assist Walk 10 feet on uneven surfaces activity did not occur:  Safety/medical concerns (decreased balance, fatigue, weakness)         Wheelchair     Assist Is the patient using a wheelchair?: Yes Type of Wheelchair: Manual    Wheelchair assist level: Supervision/Verbal cueing Max wheelchair distance: 150    Wheelchair 50 feet with 2 turns activity    Assist        Assist Level: Supervision/Verbal cueing   Wheelchair 150 feet activity     Assist      Assist Level: Supervision/Verbal cueing   Blood pressure 139/81, pulse (!) 102, temperature 97.9 F (36.6 C), temperature source Oral, resp. rate 18, height 6' (1.829 m), weight 71.4 kg, SpO2 100%.  Medical Problem List and Plan: 1. 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             -patient may not shower             -ELOS/Goals: 12-14 days, PT/OT sup             -Continue CIR             -Dr Julio Ohm note 5/26 indicated that wound VAC can be removed in about a week  Shrinker and limb guard ordered  Continue dry dressing changes  Wound assessed and picture taken for chart   Discussed discharge date and he is content with this  Therapy notes reviewed and he is CG with RW for sit to stands  2. Impaired mobility: d/ced Lovenox  due to bleeding as per ortho recs            3. Pain Management: continue oxycodone  5-10 mg PRN.              -monitor for breakthrough             -tylenol  PRN   4. Tachycardia: magnesium  gluconate 250mg  ordered       5. Neuropsych/cognition: This patient is capable of making decisions on his own behalf.  6. Skin: wound vac removed              - continue zinc  and vitamin C              -Prevalon Boot ordered   7. Fluids/Electrolytes/Nutrition: monitor I/O, check CMET              -encourage fluids(AKI)              continue Juven supplements/ Diet: Carb modified    8. TDM2: Hemoglobin A1c 6.7 (stable) - Monitor B/S  use SSI for elevated blood sugar--current BS rang 140-160s -Continue Mounjaro  15 mg subcutaneous  injection Friday (nonformulary requested/wife to bring from home). D/c HS ISS, d/c daytime ISS, decrease semglee  to 3U, advised to refuse CBG testing if performed during or after meal- should only be checked before meals Recent Labs    08/08/23 1628 08/08/23 2051 08/09/23 0529  GLUCAP 111* 158* 117*   Controlled 6/7 May use Home Mounjaro  9. HLD: continue Crestor  20 mg daily/pm  10.  HTN/Orthostatic hypotension: Monitor BP's TID -Valsartan  being held (RX note) -check orthostatic VS -reviewed medications and is not on any anti-hypertensives - Blood pressure well-controlled    08/09/2023    9:15 AM 08/09/2023    5:00 AM 08/09/2023    3:52 AM  Vitals with BMI  Weight  157 lbs 7 oz   BMI  21.34   Systolic 139  119  Diastolic 81  71  Pulse 102  103    11. Anemia: discussed that Hgb has improved to 8.2, monitor weekly  12.  Prolonged Qtc:             - Avoid QTc prolonging medications, monitor electrolytes  13.  AKI:  Cr reviewed and has improved  14.  Distal muscle atrophy and weakness.  Possible neuropathy related.  Consider outpatient EMG/nerve conduction study  15. Constipation: encouraged fruits and vegetables, continue senna prn HS, last BM 6/4, add magnesium  gluconate 250mg  daily      LOS: 10 days A FACE TO FACE EVALUATION WAS PERFORMED  Genetta Kenning 08/09/2023, 10:37 AM

## 2023-08-10 LAB — HEMOGLOBIN AND HEMATOCRIT, BLOOD
HCT: 25.5 % — ABNORMAL LOW (ref 39.0–52.0)
Hemoglobin: 8.4 g/dL — ABNORMAL LOW (ref 13.0–17.0)

## 2023-08-10 LAB — GLUCOSE, CAPILLARY
Glucose-Capillary: 97 mg/dL (ref 70–99)
Glucose-Capillary: 111 mg/dL — ABNORMAL HIGH (ref 70–99)
Glucose-Capillary: 124 mg/dL — ABNORMAL HIGH (ref 70–99)

## 2023-08-10 NOTE — Progress Notes (Signed)
 Dressing changed at 2100, minimal amount of bloody drainage noted on old dressing. No active areas of bleeding observed. Cleansed incision, New dressing applied. Denies pain. Declined prevalon boot tonight. At 0339, prn imodium  given per patient's request. Continent type 4 stool. Scott Navarro

## 2023-08-10 NOTE — Progress Notes (Signed)
 Physical Therapy Session Note  Patient Details  Name: Scott Navarro. MRN: [id number] Date of Birth: 01-23-1966  Today's Date: 08/10/2023 PT Individual Time: 0901-0958 PT Individual Time Calculation (min): 57 min   Short Term Goals: Week 2:  PT Short Term Goal 1 (Week 2): STG=LTG due to LOS  Skilled Therapeutic Interventions/Progress Updates:      Pt seated in WC upon arrival. Pt agreeable to therapy. Pt denies any pain.   Pt wearing L LE residual limb ace wrap. Pt reports bleeding at incision site on Friday. Pt endorses dressing has been changed multiple times since Friday. Pt reports 2 red spots (first noticed during night shift) on tibial tuberosity. Notified covering PA and MD and primary therapist via secure chat. Contacted hanger representative to bring padding for limb protector.   Pt donned L LE limb protector with mod I. Pt able to donn R LE leg rests with mod I but required A for L LE leg rest 2/2 visual deficits.   Pt transported dependent in [facility] outside for time/energy conservation.   Pt self propelled WC outside on uneven terrain, over thesholds, in/out of elevator, and up/down ramped sidewalk with supervision-CGA. Education provided for technique with ascending/descending ramp, therapist recommended purchasing WC gloves. Pt demonstrates improved technique with ascending backwards/descending forwards with B UE/R LE  Pt performed WC push ups x10  Pt performed squat pivot transfer WC to rehab apartment bed with supervisoin, verbal cues provided for head hip ratio.   Pt seated in [facility] with all needs within reach and chair alarm on.    Therapy Documentation Precautions:  Precautions Precautions: Fall Recall of Precautions/Restrictions: Intact Precaution/Restrictions Comments: watch BP Restrictions Weight Bearing Restrictions Per Provider Order: Yes LLE Weight Bearing Per Provider Order: Non weight bearing Other Position/Activity Restrictions: LLE limb  guard  Therapy/Group: Individual Therapy  Zachary - [facility], [place], DPT  08/10/2023, 7:50 AM

## 2023-08-10 NOTE — Plan of Care (Signed)
  Problem: Consults Goal: RH LIMB LOSS PATIENT EDUCATION Description: Description: See Patient Education module for eduction specifics. Outcome: Progressing   Problem: RH BOWEL ELIMINATION Goal: RH STG MANAGE BOWEL WITH ASSISTANCE Description: STG Manage Bowel with supervision  Assistance. Outcome: Progressing   Problem: RH BLADDER ELIMINATION Goal: RH STG MANAGE BLADDER WITH ASSISTANCE Description: STG Manage Bladder With supervision  Assistance Outcome: Progressing   Problem: RH SKIN INTEGRITY Goal: RH STG SKIN FREE OF INFECTION/BREAKDOWN Description: Manage skin free of infection/breakdown with supervision assistance Outcome: Progressing   Problem: RH SAFETY Goal: RH STG ADHERE TO SAFETY PRECAUTIONS W/ASSISTANCE/DEVICE Description: STG Adhere to Safety Precautions With Assistance/Device. Outcome: Progressing   Problem: RH PAIN MANAGEMENT Goal: RH STG PAIN MANAGED AT OR BELOW PT'S PAIN GOAL Description: <4 w/ prns Outcome: Progressing   Problem: RH KNOWLEDGE DEFICIT LIMB LOSS Goal: RH STG INCREASE KNOWLEDGE OF SELF CARE AFTER LIMB LOSS Description: Manage knowledge deficit Limb loss with supervision assistance from spouse using educational materials provided Outcome: Progressing

## 2023-08-10 NOTE — Progress Notes (Signed)
 Occupational Therapy Session Note  Patient Details  Name: Scott Navarro. MRN: [id number] Date of Birth: 11-08-1965  Today's Date: 08/10/2023 OT Individual Time: 1345-1430 OT Individual Time Calculation (min): 45 min    Short Term Goals: Week 2:  OT Short Term Goal 1 (Week 2): Pt will complete 2/3 toileting steps with CGA for balance OT Short Term Goal 2 (Week 2): Pt will donn shrinker with supervision/cues  Skilled Therapeutic Interventions/Progress Updates:    Skilled OT intervention with focus on w/c mobility, discharge planning, education, and BUE therex to increase independence with bADLs and prepare for discharge home on 6/11. Reviewed home setup and DME recommendations. Pt has ordered TTB and w/c gloves for use at home. Pt reported he has practiced bed transfers and TTB transfers. Educated pt on importance of skin inspections and issued Buyer, retail. Pt propelled w/c to ortho gym and ascended/descended ramp in gym (supervision.) BUE therex on UBE 8 mins level 4 RPM 50. Pt returned to room and remained in w/c with all needs within reach.   Therapy Documentation Precautions:  Precautions Precautions: Fall Recall of Precautions/Restrictions: Intact Precaution/Restrictions Comments: watch BP Restrictions Weight Bearing Restrictions Per Provider Order: Yes LLE Weight Bearing Per Provider Order: Non weight bearing Other Position/Activity Restrictions: LLE limb guard Pain:  Pt denies pain this afternoon    Therapy/Group: Individual Therapy  Doak Free 08/10/2023, 2:42 PM

## 2023-08-10 NOTE — Progress Notes (Signed)
 PROGRESS NOTE   Subjective/Complaints: PT notes some redness around the tibial tubercle on the left side.  He wears a limb guard when he is outside the room ROS: pain is well controlled, moving bowels regularly, +muscle atrophy, lower extremity strength is improving   Objective:   No results found. Recent Labs    08/10/23 0734  HGB 8.4*  HCT 25.5*   No results for input(s): "NA", "K", "CL", "CO2", "GLUCOSE", "BUN", "CREATININE", "CALCIUM " in the last 72 hours.    Intake/Output Summary (Last 24 hours) at 08/10/2023 1136 Last data filed at 08/10/2023 0910 Gross per 24 hour  Intake 832 ml  Output 300 ml  Net 532 ml        Physical Exam: Vital Signs Blood pressure (!) 143/81, pulse (!) 107, temperature 98.4 F (36.9 C), temperature source Oral, resp. rate 18, height 6' (1.829 m), weight 71.4 kg, SpO2 99%.  General: No acute distress Mood and affect are appropriate Heart: Regular rate and rhythm no rubs murmurs or extra sounds Lungs: Clear to auscultation, breathing unlabored, no rales or wheezes Abdomen: Positive bowel sounds, soft nontender to palpation, nondistended Extremities: No clubbing, cyanosis, or edema Getting some redness over the right tibial tuberosity likely from limb guard Comments: Left BKA-see below, no active bleeding, small amt blood on 4x 4 and very small amt on ABD 08/02/23   08/09/23  Neurological:     Mental Status: He is alert and oriented to person, place, and time. Mental status is at baseline.     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        Behavior: Behavior normal.        Thought Content: Thought content normal.        Judgment: Judgment  normal.     Assessment/Plan: 1. Functional deficits which require 3+ hours per day of interdisciplinary therapy in a comprehensive inpatient rehab setting. Physiatrist is providing close team supervision and 24 hour management of active medical problems listed below. Physiatrist and rehab team continue to assess barriers to discharge/monitor patient progress toward functional and medical goals  Care Tool:  Bathing    Body parts bathed by patient: Right arm, Left arm, Chest, Abdomen, Front perineal area, Buttocks, Face   Body parts bathed by helper: Left upper leg, Right upper leg, Right lower leg Body parts n/a: Left lower leg   Bathing assist Assist Level: Moderate Assistance - Patient 50 - 74%     Upper Body Dressing/Undressing Upper body dressing   What is the patient wearing?: Pull over shirt    Upper body assist Assist Level: Supervision/Verbal cueing    Lower Body Dressing/Undressing Lower body dressing      What is the patient wearing?: Underwear/pull up, Pants     Lower body assist Assist for lower body dressing: Minimal Assistance - Patient > 75%     Toileting Toileting  Toileting assist Assist for toileting: Minimal Assistance - Patient > 75%     Transfers Chair/bed transfer  Transfers assist     Chair/bed transfer assist level: Supervision/Verbal cueing     Locomotion Ambulation   Ambulation assist   Ambulation activity did not occur: Safety/medical concerns (decreased balance, fatigue, weakness)          Walk 10 feet activity   Assist  Walk 10 feet activity did not occur: Safety/medical concerns (decreased balance, fatigue, weakness)        Walk 50 feet activity   Assist Walk 50 feet with 2 turns activity did not occur: Safety/medical concerns (decreased balance, fatigue, weakness)         Walk 150 feet activity   Assist Walk 150 feet activity did not occur: Safety/medical concerns (decreased balance, fatigue,  weakness)         Walk 10 feet on uneven surface  activity   Assist Walk 10 feet on uneven surfaces activity did not occur: Safety/medical concerns (decreased balance, fatigue, weakness)         Wheelchair     Assist Is the patient using a wheelchair?: Yes Type of Wheelchair: Manual    Wheelchair assist level: Supervision/Verbal cueing Max wheelchair distance: 150    Wheelchair 50 feet with 2 turns activity    Assist        Assist Level: Supervision/Verbal cueing   Wheelchair 150 feet activity     Assist      Assist Level: Supervision/Verbal cueing   Blood pressure (!) 143/81, pulse (!) 107, temperature 98.4 F (36.9 C), temperature source Oral, resp. rate 18, height 6' (1.829 m), weight 71.4 kg, SpO2 99%.  Medical Problem List and Plan: 1. 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             -patient may not shower             -ELOS/Goals: 12-14 days, PT/OT sup             -Continue CIR             -Dr Julio Ohm note 5/26 indicated that wound VAC can be removed in about a week  Shrinker and limb guard ordered  Continue dry dressing changes  Wound assessed and picture taken for chart   Discussed discharge date and he is content with this  Therapy notes reviewed and he is CG with RW for sit to stands  2. Impaired mobility: d/ced Lovenox  due to bleeding as per ortho recs            3. Pain Management: continue oxycodone  5-10 mg PRN.              -monitor for breakthrough             -tylenol  PRN   4. Tachycardia: magnesium  gluconate 250mg  ordered       5. Neuropsych/cognition: This patient is capable of making decisions on his own behalf.  6. Skin: wound vac removed              - continue zinc  and vitamin C              -Prevalon Boot ordered  May use foam dressing over the tibial tuberosity until limb guard can be padded 7. Fluids/Electrolytes/Nutrition: monitor I/O, check CMET              -encourage fluids(AKI)  continue Juven supplements/ Diet: Carb modified    8. TDM2: Hemoglobin A1c 6.7 (stable) - Monitor B/S use SSI for elevated blood sugar--current BS rang 140-160s -Continue Mounjaro  15 mg subcutaneous injection Friday (nonformulary requested/wife to bring from home). D/c HS ISS, d/c daytime ISS, decrease semglee  to 3U, advised to refuse CBG testing if performed during or after meal- should only be checked before meals Recent Labs    08/09/23 1647 08/10/23 0548 08/10/23 1127  GLUCAP 109* 97 111*   Controlled 6/8 May use Home Mounjaro  9. HLD: continue Crestor  20 mg daily/pm  10.  HTN/Orthostatic hypotension: Monitor BP's TID -Valsartan  being held (RX note) -check orthostatic VS -reviewed medications and is not on any anti-hypertensives - Blood pressure well-controlled    08/10/2023    3:17 AM 08/09/2023    7:25 PM 08/09/2023    1:29 PM  Vitals with BMI  Systolic 143 127 161  Diastolic 81 76 73  Pulse 107 106 101    11. Anemia: discussed that Hgb has improved to 8.2, monitor weekly  12.  Prolonged Qtc:             - Avoid QTc prolonging medications, monitor electrolytes  13.  AKI:  Cr reviewed and has improved  14.  Distal muscle atrophy and weakness.  Possible neuropathy related.  Consider outpatient EMG/nerve conduction study  15. Constipation: encouraged fruits and vegetables, continue senna prn HS, last BM 6/4, add magnesium  gluconate 250mg  daily      LOS: 11 days A FACE TO FACE EVALUATION WAS PERFORMED  Genetta Kenning 08/10/2023, 11:36 AM

## 2023-08-11 LAB — BASIC METABOLIC PANEL WITH GFR
Anion gap: 7 (ref 5–15)
BUN: 33 mg/dL — ABNORMAL HIGH (ref 6–20)
CO2: 27 mmol/L (ref 22–32)
Calcium: 8.1 mg/dL — ABNORMAL LOW (ref 8.9–10.3)
Chloride: 102 mmol/L (ref 98–111)
Creatinine, Ser: 1.24 mg/dL (ref 0.61–1.24)
GFR, Estimated: >60 mL/min (ref >60)
Glucose, Bld: 106 mg/dL — ABNORMAL HIGH (ref 70–99)
Potassium: 4.1 mmol/L (ref 3.5–5.1)
Sodium: 136 mmol/L (ref 135–145)

## 2023-08-11 LAB — FERRITIN: Ferritin: 268 ng/mL (ref 24–336)

## 2023-08-11 LAB — RETICULOCYTES
Immature Retic Fract: 10.2 % (ref 2.3–15.9)
RBC.: 2.9 MIL/uL — ABNORMAL LOW (ref 4.22–5.81)
Retic Count, Absolute: 45.8 10*3/uL (ref 19.0–186.0)
Retic Ct Pct: 1.6 % (ref 0.4–3.1)

## 2023-08-11 LAB — CBC
HCT: 25 % — ABNORMAL LOW (ref 39.0–52.0)
Hemoglobin: 8.2 g/dL — ABNORMAL LOW (ref 13.0–17.0)
MCH: 30 pg (ref 26.0–34.0)
MCHC: 32.8 g/dL (ref 30.0–36.0)
MCV: 91.6 fL (ref 80.0–100.0)
Platelets: 389 10*3/uL (ref 150–400)
RBC: 2.73 MIL/uL — ABNORMAL LOW (ref 4.22–5.81)
RDW: 13.2 % (ref 11.5–15.5)
WBC: 9.2 10*3/uL (ref 4.0–10.5)
nRBC: 0 % (ref 0.0–0.2)

## 2023-08-11 LAB — IRON AND TIBC
Iron: 32 ug/dL — ABNORMAL LOW (ref 45–182)
Saturation Ratios: 10 % — ABNORMAL LOW (ref 17.9–39.5)
TIBC: 326 ug/dL (ref 250–450)
UIBC: 294 ug/dL

## 2023-08-11 LAB — GLUCOSE, CAPILLARY
Glucose-Capillary: 110 mg/dL — ABNORMAL HIGH (ref 70–99)
Glucose-Capillary: 115 mg/dL — ABNORMAL HIGH (ref 70–99)
Glucose-Capillary: 142 mg/dL — ABNORMAL HIGH (ref 70–99)
Glucose-Capillary: 135 mg/dL — ABNORMAL HIGH (ref 70–99)

## 2023-08-11 LAB — FOLATE: Folate: 13.9 ng/mL (ref >5.9)

## 2023-08-11 LAB — VITAMIN B12: Vitamin B-12: 763 pg/mL (ref 180–914)

## 2023-08-11 MED ORDER — MAGNESIUM GLUCONATE 500 (27 MG) MG PO TABS
500.0000 mg | ORAL_TABLET | Freq: Every day | ORAL | Status: DC
  Administered 2023-08-12 – 2023-08-13 (×2): 500 mg via ORAL
  Filled 2023-08-11 (×2): qty 1

## 2023-08-11 MED ORDER — INSULIN GLARGINE-YFGN 100 UNIT/ML ~~LOC~~ SOLN: 2.0000 [IU] | SUBCUTANEOUS | Status: DC

## 2023-08-11 NOTE — Progress Notes (Signed)
 Physical Therapy Session Note  Patient Details  Name: Scott Navarro. MRN: [id number] Date of Birth: [birth year]  Today's Date: 08/11/2023 PT Individual Time: 1130-1200 PT Individual Time Calculation (min): 30 min   Short Term Goals: Week 2:  PT Short Term Goal 1 (Week 2): STG=LTG due to LOS  Skilled Therapeutic Interventions/Progress Updates: Pt presents sitting in w/c and agreeable to therapy.  Pt wheeled to main gym w/ supervision.  Pt able to remove all equipment from w/c, but does require assist in finding correct positioning to replace all 2/2 visual deficits.  Pt transfers w/c> mat table w/ sup to mod I.  Pt sat EOM for volleyball w/ beach ball and 2# weighted bar.  Pt w/o LOB.  Pt returned to w/c and wheeled to small gym.  Pt performed car transfer to SUV height simulation w/ Supervision and use of "hand grip" inside car.  Pt returned to room and emained sitting in w/c w/ all needs in reach, waiting for friend to visit for lunch.     Therapy Documentation Precautions:  Precautions Precautions: Fall Recall of Precautions/Restrictions: Intact Precaution/Restrictions Comments: watch BP Restrictions Weight Bearing Restrictions Per Provider Order: Yes LLE Weight Bearing Per Provider Order: Non weight bearing Other Position/Activity Restrictions: LLE limb guard General:   Vital Signs:   Pain:no c/o pain.     Therapy/Group: Individual Therapy  Cinthya Bors P Rubina Basinski 08/11/2023, 12:23 PM

## 2023-08-11 NOTE — Progress Notes (Signed)
 Slept good. Dressing changed this AM with scant drainage on old dressing. Patient wanting to wear stump shrinker today. 4 x 4 with small amount of kerlix applied, 4x stump shrinker placed. Scott Navarro A

## 2023-08-11 NOTE — Progress Notes (Signed)
 Occupational Therapy Session Note  Patient Details  Name: Scott Navarro. MRN: [id number] Date of Birth: 02/09/1966  Today's Date: 08/11/2023 OT Individual Time: 1324-4010 & 1355-1450 OT Individual Time Calculation (min): 40 min & 55 min    Short Term Goals: Week 2:  OT Short Term Goal 1 (Week 2): Pt will complete 2/3 toileting steps with CGA for balance OT Short Term Goal 2 (Week 2): Pt will donn shrinker with supervision/cues  Skilled Therapeutic Interventions/Progress Updates:  Session 1 Skilled OT intervention completed with focus on DC planning, DME education, w/c mobility and functional transfers. Pt received seated in w/c, agreeable to session. No pain reported.  Pt reported his friend brought his personal w/c. OT adjusted bilateral leg rests for L residual limb vs elevating leg rest, and RLE for appropriate leg length. Pt transferred from w/c > EOB > personal w/c with supervision lateral scoot, with assist/cues needed for new brake extenders that he hasn't used before. Education provided on safety considerations with brake extenders including lateral transfers. Recommended pt remove to do lateral scoot for skin integrity and to ensure w/c does not accidentally lock.   Pt self-propelled in w/c <> gym with mod I. Pt needed max cueing for sequencing and safety to position new w/c next to mat, and to fully ensure w/c was locked prior to transfer and lifting arm rest. Discussed that pt has been able to utilize standard brakes with coban and could apply coban to new w/c without extenders in place, however pt preferring the extenders due to "ease with locking."   Completed X5 sit > stands using RW with CGA for each. Pt with good carryover of correcting R foot into neutral vs ER, and with neutral hip alignment. Did have minor LOB but no increased assist needed to correct. Discussed application of coban to bilateral handles on his new RW when delivered for increased grip. Pt required cues to  set w/c up next to him in prep for transfer for proper sequencing, but overall supervision for lateral scoot. Back in room, pt remained seated in w/c, with all needs in reach at end of session.  Session 2 Skilled OT intervention completed with focus on family education with wife present. Pt received seated in w/c, agreeable to session. No pain reported.  OT provided education on the following in prep for DC: -Recommended lateral scoots for all mobility at mod I level. Mod I level BADLs at w/c level only. Discussed that pt can sit > stand with CGA/supervision with wife's assist, but no ADLs or transfers at standing level due to fall risk -Confirmed bathroom set up, with wife reporting tub/shower. Per report they already purchased TTB with shower curtain cut out as advised from earlier session. -Advised use of hand held shower head for ease of bathing. Discussed using lateral leans for peri-washing -Energy conservation strategies- lukewarm water, proper ventilation via fan or cracked door to reduce steam and planning ahead for shower tasks to be rested/have rest time following in case of fatigue -Discussed difference of standard toilet vs toileting on DABBSC. Wife reports she questions if w/c can enter bathroom doorway and pt did not give her the home measurement sheet issued earlier in therapy. Discussed removal of brake extenders and door if needing couple more inches however did discuss plan B option of using DABBSC outside of bathroom if deemed inaccessible -Standing beside pt vs in front when practicing sit > stands -OP therapy vs HH and pt's desire to pend therapies until [place]  on LLE  OT assisted pt in demonstrating the following during session: -Supervision lateral scoot from w/c <> TTB with cues needed for proper sequencing I.e. locking brakes, removing brake extender prior to transfer -CGA sit > stand X 2 using RW  Pt remained seated in w/c, with wife present and with all needs in reach at  end of session.   Therapy Documentation Precautions:  Precautions Precautions: Fall Recall of Precautions/Restrictions: Intact Precaution/Restrictions Comments: watch BP Restrictions Weight Bearing Restrictions Per Provider Order: Yes LLE Weight Bearing Per Provider Order: Non weight bearing Other Position/Activity Restrictions: LLE limb guard    Therapy/Group: Individual Therapy  Ruthanna Covert, MS, OTR/L  08/11/2023, 3:41 PM

## 2023-08-11 NOTE — Progress Notes (Signed)
 PROGRESS NOTE   Subjective/Complaints: No new complaints this morning Discussed that Hgb is 8.2 He asks about staples removal Discussed CBGs are excellent  ROS: some bleeding from residual limb  Objective:   No results found. Recent Labs    08/10/23 0734 08/11/23 0525  WBC  --  9.2  HGB 8.4* 8.2*  HCT 25.5* 25.0*  PLT  --  389   Recent Labs    08/11/23 0525  NA 136  K 4.1  CL 102  CO2 27  GLUCOSE 106*  BUN 33*  CREATININE 1.24  CALCIUM  8.1*      Intake/Output Summary (Last 24 hours) at 08/11/2023 0937 Last data filed at 08/11/2023 0800 Gross per 24 hour  Intake 1068 ml  Output 300 ml  Net 768 ml        Physical Exam: Vital Signs Blood pressure 135/84, pulse (!) 101, temperature 98.3 F (36.8 C), temperature source Oral, resp. rate 18, height 6' (1.829 m), weight 71.4 kg, SpO2 100%.  General: No acute distress Mood and affect are appropriate Heart: Tachycardic Lungs: Clear to auscultation, breathing unlabored, no rales or wheezes Abdomen: Positive bowel sounds, soft nontender to palpation, nondistended Extremities: No clubbing, cyanosis, or edema Getting some redness over the right tibial tuberosity likely from limb guard Comments: Left BKA-see below, no active bleeding, small amt blood on 4x 4 and very small amt on ABD 08/02/23   08/09/23  Neurological:     Mental Status: He is alert and oriented to person, place, and time. Mental status is at baseline.     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        Behavior: Behavior normal.        Thought Content: Thought content normal.        Judgment: Judgment normal.     Assessment/Plan: 1. Functional  deficits which require 3+ hours per day of interdisciplinary therapy in a comprehensive inpatient rehab setting. Physiatrist is providing close team supervision and 24 hour management of active medical problems listed below. Physiatrist and rehab team continue to assess barriers to discharge/monitor patient progress toward functional and medical goals  Care Tool:  Bathing    Body parts bathed by patient: Right arm, Left arm, Chest, Abdomen, Front perineal area, Buttocks, Face   Body parts bathed by helper: Left upper leg, Right upper leg, Right lower leg Body parts n/a: Left lower leg   Bathing assist Assist Level: Moderate Assistance - Patient 50 - 74%     Upper Body Dressing/Undressing Upper body dressing   What is the patient wearing?: Pull over shirt    Upper body assist Assist Level: Supervision/Verbal cueing    Lower Body Dressing/Undressing Lower body dressing      What is the patient wearing?: Underwear/pull up, Pants     Lower body assist Assist for lower body dressing: Minimal Assistance - Patient >  75%     Toileting Toileting    Toileting assist Assist for toileting: Minimal Assistance - Patient > 75%     Transfers Chair/bed transfer  Transfers assist     Chair/bed transfer assist level: Supervision/Verbal cueing     Locomotion Ambulation   Ambulation assist   Ambulation activity did not occur: Safety/medical concerns (decreased balance, fatigue, weakness)          Walk 10 feet activity   Assist  Walk 10 feet activity did not occur: Safety/medical concerns (decreased balance, fatigue, weakness)        Walk 50 feet activity   Assist Walk 50 feet with 2 turns activity did not occur: Safety/medical concerns (decreased balance, fatigue, weakness)         Walk 150 feet activity   Assist Walk 150 feet activity did not occur: Safety/medical concerns (decreased balance, fatigue, weakness)         Walk 10 feet on uneven surface   activity   Assist Walk 10 feet on uneven surfaces activity did not occur: Safety/medical concerns (decreased balance, fatigue, weakness)         Wheelchair     Assist Is the patient using a wheelchair?: Yes Type of Wheelchair: Manual    Wheelchair assist level: Supervision/Verbal cueing Max wheelchair distance: 150    Wheelchair 50 feet with 2 turns activity    Assist        Assist Level: Supervision/Verbal cueing   Wheelchair 150 feet activity     Assist      Assist Level: Supervision/Verbal cueing   Blood pressure 135/84, pulse (!) 101, temperature 98.3 F (36.8 C), temperature source Oral, resp. rate 18, height 6' (1.829 m), weight 71.4 kg, SpO2 100%.  Medical Problem List and Plan: 1. 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             -patient may not shower             -ELOS/Goals: 12-14 days, PT/OT sup             -Continue CIR             -Scott Navarro note 5/26 indicated that wound VAC can be removed in about a week  Shrinker and limb guard ordered  Continue dry dressing changes  Wound assessed and picture taken for chart   Discussed discharge date and he is content with this  Therapy notes reviewed and he is CG with RW for sit to stands  2. Impaired mobility: d/ced Lovenox  due to bleeding as per ortho recs            3. Pain Management: continue oxycodone  5-10 mg PRN.              -monitor for breakthrough             -tylenol  PRN   4.        5. Neuropsych/cognition: This patient is capable of making decisions on his own behalf.  6. Skin: wound vac removed              - continue zinc  and vitamin C              -Prevalon Boot ordered  May use foam dressing over the tibial tuberosity until limb guard can be padded  7. Fluids/Electrolytes/Nutrition: monitor I/O, check CMET              -encourage fluids(AKI)  continue Juven supplements/ Diet: Carb modified    8. TDM2: Hemoglobin A1c 6.7  (stable) - Monitor B/S use SSI for elevated blood sugar--current BS rang 140-160s -Continue Mounjaro  15 mg subcutaneous injection Friday (nonformulary requested/wife to bring from home). D/c HS ISS, d/c daytime ISS, d/c semglee , advised to refuse CBG testing if performed during or after meal- should only be checked before meals Recent Labs    08/10/23 1127 08/10/23 1654 08/11/23 0524  GLUCAP 111* 124* 110*   Controlled 6/8 May use Home Mounjaro   9. HLD: continue Crestor  20 mg daily/pm  10.  HTN/Orthostatic hypotension: Monitor BP's TID -Valsartan  being held (RX note) -check orthostatic VS -reviewed medications and is not on any anti-hypertensives - Blood pressure well-controlled    08/11/2023    3:18 AM 08/10/2023    7:29 PM 08/10/2023    2:52 PM  Vitals with BMI  Systolic 135 129 578  Diastolic 84 72 68  Pulse 101 104 105    11. Anemia: discussed that Hgb has improved to 8.2, monitor weekly  12.  Prolonged Qtc:             - Avoid QTc prolonging medications, monitor electrolytes  13.  AKI:  Cr reviewed and has improved  14.  Distal muscle atrophy and weakness.  Possible neuropathy related.  Consider outpatient EMG/nerve conduction study  15. Constipation: encouraged fruits and vegetables, continue senna prn HS, last BM 6/8, increase magnesium  gluconate to 500mg  HS  16. Tachycardia: increase magneisum gluconate to 500mg  daily      LOS: 12 days A FACE TO FACE EVALUATION WAS PERFORMED  Scott Navarro Scott Navarro 08/11/2023, 9:37 AM

## 2023-08-11 NOTE — Progress Notes (Signed)
 Occupational Therapy Session Note  Patient Details  Name: Varian Innes. MRN: [id number] Date of Birth: [birth year]  {CHL IP REHAB OT TIME CALCULATIONS:[id number]}   Short Term Goals: Week 2:  OT Short Term Goal 1 (Week 2): Pt will complete 2/3 toileting steps with CGA for balance OT Short Term Goal 2 (Week 2): Pt will donn shrinker with supervision/cues  Skilled Therapeutic Interventions/Progress Updates:    Patient agreeable to participate in OT session. Reports *** pain level.   Patient participated in skilled OT session focusing on ***. Therapist facilitated/assessed/developed/educated/integrated/elicited *** in order to improve/facilitate/promote    Therapy Documentation Precautions:  Precautions Precautions: Fall Recall of Precautions/Restrictions: Intact Precaution/Restrictions Comments: watch BP Restrictions Weight Bearing Restrictions Per Provider Order: Yes LLE Weight Bearing Per Provider Order: Non weight bearing Other Position/Activity Restrictions: LLE limb guard  Therapy/Group: Individual Therapy  Carollee Circle, OTR/L,CBIS  Supplemental OT - [facility] and [facility] Secure Chat Preferred   08/11/2023, 9:47 PM

## 2023-08-12 ENCOUNTER — Telehealth: Payer: Self-pay | Admitting: Radiology

## 2023-08-12 ENCOUNTER — Other Ambulatory Visit (HOSPITAL_COMMUNITY): Payer: Self-pay

## 2023-08-12 DIAGNOSIS — K5900 Constipation, unspecified: Secondary | ICD-10-CM

## 2023-08-12 DIAGNOSIS — E1142 Type 2 diabetes mellitus with diabetic polyneuropathy: Secondary | ICD-10-CM

## 2023-08-12 LAB — GLUCOSE, CAPILLARY
Glucose-Capillary: 125 mg/dL — ABNORMAL HIGH (ref 70–99)
Glucose-Capillary: 117 mg/dL — ABNORMAL HIGH (ref 70–99)
Glucose-Capillary: 126 mg/dL — ABNORMAL HIGH (ref 70–99)

## 2023-08-12 MED ORDER — MAGNESIUM GLUCONATE 500 (27 MG) MG PO TABS
500.0000 mg | ORAL_TABLET | Freq: Every day | ORAL | 0 refills | Status: AC
  Filled 2023-08-12: qty 30, 30d supply, fill #0

## 2023-08-12 MED ORDER — OXYCODONE HCL 10 MG PO TABS
10.0000 mg | ORAL_TABLET | Freq: Four times a day (QID) | ORAL | 0 refills | Status: AC | PRN
Start: 2023-08-12 — End: ?
  Filled 2023-08-12: qty 28, 7d supply, fill #0

## 2023-08-12 MED ORDER — ZINC SULFATE 220 (50 ZN) MG PO TABS
220.0000 mg | ORAL_TABLET | Freq: Every day | ORAL | 0 refills | Status: AC
  Filled 2023-08-12: qty 30, 30d supply, fill #0

## 2023-08-12 MED ORDER — CYANOCOBALAMIN 1000 MCG PO TABS
1000.0000 ug | ORAL_TABLET | Freq: Every day | ORAL | 0 refills | Status: AC
Start: 2023-08-12 — End: ?
  Filled 2023-08-12: qty 30, 30d supply, fill #0

## 2023-08-12 MED ORDER — FERROUS SULFATE 325 (65 FE) MG PO TABS
325.0000 mg | ORAL_TABLET | Freq: Every day | ORAL | Status: DC
  Administered 2023-08-13: 325 mg via ORAL
  Filled 2023-08-12: qty 1

## 2023-08-12 MED ORDER — FERROUS SULFATE 325 (65 FE) MG PO TABS
325.0000 mg | ORAL_TABLET | Freq: Every day | ORAL | 0 refills | Status: AC
Start: 2023-08-13 — End: ?
  Filled 2023-08-12: qty 30, 30d supply, fill #0

## 2023-08-12 MED ORDER — ROSUVASTATIN CALCIUM 20 MG PO TABS: 20.0000 mg | ORAL_TABLET | Freq: Every day | ORAL | Status: DC

## 2023-08-12 MED ORDER — SENNA 8.6 MG PO TABS
1.0000 | ORAL_TABLET | Freq: Every evening | ORAL | 0 refills | Status: AC | PRN
  Filled 2023-08-12: qty 120, 120d supply, fill #0

## 2023-08-12 MED ORDER — ASCORBIC ACID 1000 MG PO TABS
1000.0000 mg | ORAL_TABLET | Freq: Every day | ORAL | 0 refills | Status: AC
Start: 2023-08-12 — End: ?
  Filled 2023-08-12: qty 100, 100d supply, fill #0

## 2023-08-12 MED ORDER — ACETAMINOPHEN 325 MG PO TABS
650.0000 mg | ORAL_TABLET | ORAL | 0 refills | Status: AC | PRN
  Filled 2023-08-12: qty 100, 8d supply, fill #0

## 2023-08-12 MED ORDER — VITAMIN D3 25 MCG PO TABS
1000.0000 [IU] | ORAL_TABLET | Freq: Every day | ORAL | 0 refills | Status: AC
  Filled 2023-08-12: qty 30, 30d supply, fill #0

## 2023-08-12 NOTE — Progress Notes (Signed)
 PROGRESS NOTE   Subjective/Complaints: Looking forward to discharge tomorrow.  Asked about when staples can come out.  ROS: some bleeding from residual limb, denies chest pain, shortness of breath, abdominal pain, nausea, vomiting  Objective:   No results found. Recent Labs    08/10/23 0734 08/11/23 0525  WBC  --  9.2  HGB 8.4* 8.2*  HCT 25.5* 25.0*  PLT  --  389   Recent Labs    08/11/23 0525  NA 136  K 4.1  CL 102  CO2 27  GLUCOSE 106*  BUN 33*  CREATININE 1.24  CALCIUM  8.1*      Intake/Output Summary (Last 24 hours) at 08/12/2023 1230 Last data filed at 08/12/2023 1610 Gross per 24 hour  Intake 1032 ml  Output 0 ml  Net 1032 ml        Physical Exam: Vital Signs Blood pressure (!) 160/87, pulse (!) 102, temperature 98.2 F (36.8 C), temperature source Oral, resp. rate 16, height 6' (1.829 m), weight 71.4 kg, SpO2 100%.  General: No acute distress, sitting in wheelchair Mood and affect are appropriate Heart: Mild tachycardia Lungs: Clear to auscultation, breathing unlabored, no rales or wheezes Abdomen: Positive bowel sounds, soft nontender to palpation, nondistended Extremities: No clubbing, cyanosis, or edema Getting some redness over the right tibial tuberosity likely from limb guard Comments: Left BKA-covered with dry dressing 08/02/23   08/09/23  Neurological:     Mental Status: He is alert and oriented to person, place, and time. Mental status is at baseline.     Sensory: Mild altered sensation to light touch in his distal feet      Atrophy in intrinsic hand muscles  Physical exam unchanged from the above on reexamination 08/12/23       Assessment/Plan: 1. Functional deficits which require 3+ hours per day of interdisciplinary therapy in a comprehensive inpatient rehab setting. Physiatrist is providing close team supervision and 24 hour management of active medical problems listed  below. Physiatrist and rehab team continue to assess barriers to discharge/monitor patient progress toward functional and medical goals  Care Tool:  Bathing    Body parts bathed by patient: Right arm, Left arm, Chest, Abdomen, Front perineal area, Buttocks, Face   Body parts bathed by helper: Left upper leg, Right upper leg, Right lower leg Body parts n/a: Left lower leg   Bathing assist Assist Level: Moderate Assistance - Patient 50 - 74%     Upper Body Dressing/Undressing Upper body dressing   What is the patient wearing?: Pull over shirt    Upper body assist Assist Level: Supervision/Verbal cueing    Lower Body Dressing/Undressing Lower body dressing      What is the patient wearing?: Underwear/pull up, Pants     Lower body assist Assist for lower body dressing: Minimal Assistance - Patient > 75%     Toileting Toileting    Toileting assist Assist for toileting: Minimal Assistance - Patient > 75%     Transfers Chair/bed transfer  Transfers assist     Chair/bed transfer assist level: Independent with assistive device     Locomotion Ambulation   Ambulation assist   Ambulation activity did not occur:  Safety/medical concerns (weakness, decreased balance)          Walk 10 feet activity   Assist  Walk 10 feet activity did not occur: Safety/medical concerns (weakness, decreased balance)        Walk 50 feet activity   Assist Walk 50 feet with 2 turns activity did not occur: Safety/medical concerns (weakness, decreased balance)         Walk 150 feet activity   Assist Walk 150 feet activity did not occur: Safety/medical concerns (weakness, decreased balance)         Walk 10 feet on uneven surface  activity   Assist Walk 10 feet on uneven surfaces activity did not occur: Safety/medical concerns (weakness, decreased balance)         Wheelchair     Assist Is the patient using a wheelchair?: Yes Type of Wheelchair: Manual     Wheelchair assist level: Independent Max wheelchair distance: 118ft    Wheelchair 50 feet with 2 turns activity    Assist        Assist Level: Independent   Wheelchair 150 feet activity     Assist      Assist Level: Independent   Blood pressure (!) 160/87, pulse (!) 102, temperature 98.2 F (36.8 C), temperature source Oral, resp. rate 16, height 6' (1.829 m), weight 71.4 kg, SpO2 100%.  Medical Problem List and Plan: 1. 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             -patient may not shower             -ELOS/Goals: 12-14 days, PT/OT sup             -Continue CIR             -Dr Julio Ohm note 5/26 indicated that wound VAC can be removed in about a week  Shrinker and limb guard ordered  Continue dry dressing changes  Wound assessed and picture taken for chart   Discussed discharge date and he is content with this  -Will hold off on staple removal for now as a little early, patient will follow-up with orthopedics outpatient  2. Impaired mobility: d/ced Lovenox  due to bleeding as per ortho recs            3. Pain Management: continue oxycodone  5-10 mg PRN.              -monitor for breakthrough             -tylenol  PRN   4. Tachycardia: magnesium  gluconate 250mg  ordered        5. Neuropsych/cognition: This patient is capable of making decisions on his own behalf.  6. Skin: wound vac removed              - continue zinc  and vitamin C              -Prevalon Boot ordered  May use foam dressing over the tibial tuberosity until limb guard can be padded  7. Fluids/Electrolytes/Nutrition: monitor I/O, check CMET              -encourage fluids(AKI)              continue Juven supplements/ Diet: Carb modified    8. TDM2: Hemoglobin A1c 6.7 (stable) - Monitor B/S use SSI for elevated blood sugar--current BS rang 140-160s -Continue Mounjaro  15 mg subcutaneous injection Friday (nonformulary requested/wife to bring from home). D/c HS ISS,  d/c daytime ISS,  d/c semglee , advised to refuse CBG testing if performed during or after meal- should only be checked before meals Recent Labs    08/11/23 2044 08/12/23 0648 08/12/23 1136  GLUCAP 135* 125* 117*   Controlled 6/10 May use Home Mounjaro   9. HLD: continue Crestor  20 mg daily/pm  10.  HTN/Orthostatic hypotension: Monitor BP's TID -Valsartan  being held (RX note) -check orthostatic VS -reviewed medications and is not on any anti-hypertensives -6/10 BP elevated today but overall has been well-controlled, continue to monitor    08/12/2023    5:00 AM 08/11/2023    7:39 PM 08/11/2023    4:18 PM  Vitals with BMI  Systolic 160 142 956  Diastolic 87 82 71  Pulse 102 100 105    11. Anemia: discussed that Hgb has improved to 8.2, monitor weekly  -6/10 HGB stable at 8.2  12.  Prolonged Qtc:             - Avoid QTc prolonging medications, monitor electrolytes  13.  AKI:  Cr reviewed and has improved  -Cr and BUN down to 33/1.24, overall improved  14.  Distal muscle atrophy and weakness.  Possible neuropathy related.  Consider outpatient EMG/nerve conduction study  15. Constipation: encouraged fruits and vegetables, continue senna prn HS, last BM 6/8, increase magnesium  gluconate to 500mg  HS  -6/10 LBM today improved, has had iron studies and was started on ferrous sulfate       LOS: 13 days A FACE TO FACE EVALUATION WAS PERFORMED  Lylia Sand 08/12/2023, 12:30 PM

## 2023-08-12 NOTE — Plan of Care (Signed)
  Problem: RH Balance Goal: LTG Patient will maintain dynamic standing with ADLs (OT) Description: LTG:  Patient will maintain dynamic standing balance with assist during activities of daily living (OT)  Outcome: Completed/Met   Problem: RH Dressing Goal: LTG Patient will perform lower body dressing w/assist (OT) Description: LTG: Patient will perform lower body dressing with assist, with/without cues in positioning using equipment (OT) Outcome: Completed/Met   Problem: RH Toileting Goal: LTG Patient will perform toileting task (3/3 steps) with assistance level (OT) Description: LTG: Patient will perform toileting task (3/3 steps) with assistance level (OT)  Outcome: Completed/Met   Problem: RH Toilet Transfers Goal: LTG Patient will perform toilet transfers w/assist (OT) Description: LTG: Patient will perform toilet transfers with assist, with/without cues using equipment (OT) Outcome: Completed/Met   Problem: RH Tub/Shower Transfers Goal: LTG Patient will perform tub/shower transfers w/assist (OT) Description: LTG: Patient will perform tub/shower transfers with assist, with/without cues using equipment (OT) Outcome: Completed/Met

## 2023-08-12 NOTE — Telephone Encounter (Signed)
 Patient called from the hospital and asked if they were supposed to remove his staples, or if we need to make him a return appointment to do so.  I advised, leave staples in and made an appointment for next Tuesday morning.  Patient states he is to be discharged tomorrow. Please advise if you want him to do something differently.

## 2023-08-12 NOTE — Progress Notes (Signed)
 Physical Therapy Session Note  Patient Details  Name: Scott Navarro. MRN: [id number] Date of Birth: 23-Apr-1965  Today's Date: 08/12/2023 PT Individual Time: 1502-1600 PT Individual Time Calculation (min): 58 min   Short Term Goals: Week 1:  PT Short Term Goal 1 (Week 1): pt will transfer bed<>chair with LRAD and CGA PT Short Term Goal 1 - Progress (Week 1): Met PT Short Term Goal 2 (Week 1): pt will transfer sit<>stand with LRAD and CGA PT Short Term Goal 2 - Progress (Week 1): Met PT Short Term Goal 3 (Week 1): pt will amblate 69ft with LRAD and mod A PT Short Term Goal 3 - Progress (Week 1): Progressing toward goal Week 2:  PT Short Term Goal 1 (Week 2): STG=LTG due to LOS  Skilled Therapeutic Interventions/Progress Updates:  Patient seated upright in w/c on entrance to room. Patient alert and agreeable to PT session. Wife present for family education  Patient with no pain complaint at start of session.  Therapeutic Activity: Both have completed session with OT just prior to this session and both relate participating in transfers to/ from various heights and surfaces requiring proper w/c positioning, lateral scooting, squat pivot transfers with no issues. Pt able to relate current home setup is already w/c-accessible d/t son currently not ambulatory and transferring via hoyer lift. Entrance to home fairly level with one step but pt and wife relating boards/ ramp available to assist.   Pt also relates that he is currently sitting in personal w/c from home and setup looks good - fits pt well.   Pt propelled wheelchair >250' x2 with supervision/ Mod I. He propels with good efficiency and maneuvers all turns and obstacles safely without cues for technique. Requires some assist with managing legrests. However is able to manage brakes well and can determine when to use/ remove/ replace extender brake handles for improved positioning of w/c. Requires minimal vc for improved positioning of  w/c when setting up for car transfer. Also educated wife on positioning front of w/c cushion with back of car seat for safe pt transfer. Pt able to perform lateral transfer with use of hand grip on door frame. Completed in/ out of car with supervision.   Pt with questions re: process of getting prosthetic leg. Education provided on sequence of events from using shrinker to shape residual limb for optimal socket formation and fit. Once healed, pt will visit prosthetist who will take time and several fittings to perfectly form socket to pt's comfort level for good skin integrity. Explained silicone sleeve that holds pin in place at end of LE which is what secures prosthetic leg to pt. Pt with improved understanding and appreciative of information. He is looking forward to initiation of gait training and has positive life outlook for progress.   Patient seated upright in w/c at end of session with brakes locked, no alarm set as pt cognitively aware of abilities and need for assist. All needs within reach.   Therapy Documentation Precautions:  Precautions Precautions: Fall Recall of Precautions/Restrictions: Intact Precaution/Restrictions Comments: watch BP Restrictions Weight Bearing Restrictions Per Provider Order: Yes LLE Weight Bearing Per Provider Order: Non weight bearing Other Position/Activity Restrictions: LLE limb guard  Pain:  No pain related by pt this session.    Therapy/Group: Individual Therapy  Donne Gage PT, DPT, CSRS 08/11/2023, 5:47 PM

## 2023-08-12 NOTE — Progress Notes (Signed)
 Patient ID: Scott Life., male   DOB: 19-Jun-1965, 58 y.o.   MRN: [id number] Follow up with the patient regarding preparation for discharge. Patient reports he is glad he was able to participate in CIR. Questioning staples out of leg; staples and sutures at BKA incision site with small amount of drainage noted at dressing change. Updated pictures in EPIC.  Reports he has 6 shrinkers based on edema and understands wear/care. Limb guard in place.  Given handouts on recovery timeline and support group access. Continue to follow along to address educational needs to facilitate preparation for discharge. Naoma Bacca

## 2023-08-12 NOTE — Progress Notes (Signed)
 Occupational Therapy Session Note  Patient Details  Name: Scott Navarro. MRN: [id number] Date of Birth: 07-03-1965  Today's Date: 08/12/2023 OT Individual Time: 1420-1500 OT Individual Time Calculation (min): 40 min    Short Term Goals: Week 2:  OT Short Term Goal 1 (Week 2): Pt will complete 2/3 toileting steps with CGA for balance OT Short Term Goal 2 (Week 2): Pt will donn shrinker with supervision/cues  Skilled Therapeutic Interventions/Progress Updates:  Skilled OT intervention completed with focus on BUE endurance, FMC. Pt received seated in w/c, agreeable to session. No pain reported.  Pt self-propelled in w/c > 300 ft with mod I <> gym. Pt completed the following intervals while seated at the UE Nustep utilizing pace partner to maintain RPMs to promote global/BUE endurance needed for independence with BADLs and functional transfers: -5 min, level 2, forwards -5 min, level 2, backwards  9HPT (FMC) Rt hand- 2.26 min; standard = 20 sec for 58 yo male Lt hand- 1.20 min  Discussed the muscle wasting in bilateral hands and OT recommendation to MD for pt follow up with a potential nerve conduction test to rule out any findings that may be progressively contributing to atrophy as pt has had significant loss in both hands and greatly affects his [facility] and dexterity.Transported dependently in w/c > room. Pt remained seated in w/c, and with all needs in reach at end of session.   Therapy Documentation Precautions:  Precautions Precautions: Fall Recall of Precautions/Restrictions: Intact Precaution/Restrictions Comments: watch BP Restrictions Weight Bearing Restrictions Per Provider Order: Yes LLE Weight Bearing Per Provider Order: Non weight bearing Other Position/Activity Restrictions: LLE limb guard    Therapy/Group: Individual Therapy  Ruthanna Covert, MS, OTR/L  08/12/2023, 3:11 PM

## 2023-08-12 NOTE — Progress Notes (Signed)
 Occupational Therapy Session Note  Patient Details  Name: Scott Navarro. MRN: [id number] Date of Birth: September 02, 1965  Today's Date: 08/12/2023 OT Individual Time: 4098-1191 OT Individual Time Calculation (min): 30 min    Short Term Goals: Week 1:  OT Short Term Goal 1 (Week 1): pt will demonstrate improved BUE and core strength while completing stand pivot transfer with Min A utilizing RW. OT Short Term Goal 1 - Progress (Week 1): Met OT Short Term Goal 2 (Week 1): Pt will demonstrate improved safety awareness and problem solving while navigating manual WC within personal room and rehab unit with Min A. OT Short Term Goal 2 - Progress (Week 1): Met Week 2:  OT Short Term Goal 1 (Week 2): Pt will complete 2/3 toileting steps with CGA for balance OT Short Term Goal 2 (Week 2): Pt will donn shrinker with supervision/cues  Skilled Therapeutic Interventions/Progress Updates:    1:1 Pt met in the w/c . Pt has been mod I in room for basic transfers, toileting and toilet transfers etc. Discussed residual limb care including how to cover in the shower, caring for residual limb incision care and changing a bandage. Incision is still bleeding -RN aware;  bandage changed and new cleaned shrinker donned. Pt left up mod I in the room.   Therapy Documentation Precautions:  Precautions Precautions: Fall Recall of Precautions/Restrictions: Intact Precaution/Restrictions Comments: watch BP Restrictions Weight Bearing Restrictions Per Provider Order: Yes LLE Weight Bearing Per Provider Order: Non weight bearing Other Position/Activity Restrictions: LLE limb guard   Pain: No reports of pain   Therapy/Group: Individual Therapy  Henrene Locust [facility] 08/12/2023, 11:18 AM

## 2023-08-12 NOTE — Patient Instructions (Signed)
 1) Strengthening: Chest Pull - Resisted   Hold Theraband in front of body with hands about shoulder width a part. Pull band a part and back together slowly. Repeat __10-15__ times. Complete ___1_ set(s) per session.. Repeat ___1_ session(s) per day.  http://orth.exer.us/926   Copyright  VHI. All rights reserved.   2) PNF Strengthening: Resisted   Standing with resistive band around each hand, bring right arm up and away, thumb back. Repeat _10-15___ times per set. Do __1__ sets per session. Do __1__ sessions per day.    3) Resisted External Rotation: in Neutral - Bilateral   Sit or stand, tubing in both hands, elbows at sides, bent to 90, forearms forward. Pinch shoulder blades together and rotate forearms out. Keep elbows at sides. Repeat _10-15___ times per set. Do __1__ sets per session. Do _1___ sessions per day.  http://orth.exer.us/966   Copyright  VHI. All rights reserved.   4) PNF Strengthening: Resisted   Standing, hold resistive band above head. Bring right arm down and out from side. Repeat _10-15___ times per set. Do __1__ sets per session. Do __1__ sessions per day.  http://orth.exer.us/922   Copyright  VHI. All rights reserved.     ELASTIC BAND TRICEPS EXTENSION - SELF FIXATION  While seated, hold and fixate one end of an elastic band against your chest. Hold the other end with your opposite hand with your elbow bent and arm by your side.   Start by pulling the band downward so that the elbow goes from a bent position to a straightened position as shown. Return to starting position and repeat.  Complete 10-15 repetitions, 1 set.     Theraband Elbow Flexion  Loop the middle of the band around one foot  With arms straight by side and palms facing forward, hold onto each end of the band Bend arms bringing hands toward shoulders, keeping elbows by your side Return to starting position . Complete 10-15 repetitions, 1 set.

## 2023-08-12 NOTE — Progress Notes (Signed)
 Occupational Therapy Discharge Summary  Patient Details  Name: Scott Navarro. MRN: [id number] Date of Birth: [birth year]  Date of Discharge from OT service:August 12, 2023   Patient has met 5 of 5 Gahm term goals due to improved activity tolerance, improved balance, ability to compensate for deficits, improved awareness, and improved coordination.  Patient to discharge at overall Modified Independent level.  Patient's care partner is independent to provide the necessary physical assistance at discharge.    All goals met  Recommendation:  Patient will benefit from ongoing skilled OT services in outpatient setting to continue to advance functional skills in the area of BADL, iADL, and Reduce care partner burden.  Equipment: No equipment provided  Reasons for discharge: treatment goals met  Patient/family agrees with progress made and goals achieved: Yes  OT Discharge Precautions/Restrictions  Precautions Precautions: Fall Restrictions Weight Bearing Restrictions Per Provider Order: Yes LLE Weight Bearing Per Provider Order: Non weight bearing Other Position/Activity Restrictions: LLE limb guard ADL ADL Eating: Independent Where Assessed-Eating: Wheelchair Grooming: Modified independent Where Assessed-Grooming: Sitting at sink Upper Body Bathing: Modified independent Where Assessed-Upper Body Bathing: Sitting at sink Lower Body Bathing: Modified independent Where Assessed-Lower Body Bathing: Other (Comment) (DABBSC) Upper Body Dressing: Independent Where Assessed-Upper Body Dressing: Wheelchair Lower Body Dressing: Modified independent Where Assessed-Lower Body Dressing: Other (Comment) (DABBSC) Toileting: Modified independent Where Assessed-Toileting: Bedside Commode Toilet Transfer: Modified independent Toilet Transfer Method: Other (comment) (lateral scoot) Acupuncturist: Extra wide drop arm bedside commode Tub/Shower Transfer: Modified  independent Web designer Method: Conservation officer, historic buildings: Insurance underwriter: Not assessed Vision Baseline Vision/History: 1 Wears glasses;4 Cataracts Patient Visual Report: No change from baseline Vision Assessment?: Wears glasses for reading Perception  Perception: Within Functional Limits Praxis Praxis: WFL Cognition Cognition Overall Cognitive Status: Within Functional Limits for tasks assessed Arousal/Alertness: Awake/alert Orientation Level: Place;Person;Situation Person: Oriented Place: Oriented Situation: Oriented Memory: Appears intact Awareness: Appears intact Problem Solving: Appears intact Safety/Judgment: Appears intact Brief Interview for Mental Status (BIMS) Repetition of Three Words (First Attempt): 3 Temporal Orientation: Year: Correct Temporal Orientation: Month: Accurate within 5 days Temporal Orientation: Day: Correct Recall: "Sock": Yes, no cue required Recall: "Blue": Yes, no cue required Recall: "Bed": Yes, no cue required BIMS Summary Score: 15 Sensation Sensation Light Touch: Impaired Detail Light Touch Impaired Details: Impaired RLE;Impaired LLE Hot/Cold: Not tested Proprioception: Impaired by gross assessment Stereognosis: Not tested Additional Comments: absent sensation along R great toe and R lateral malleoli; decreased sensation along distal aspect of L residual limb. Coordination Gross Motor Movements are Fluid and Coordinated: No Fine Motor Movements are Fluid and Coordinated: Yes Coordination and Movement Description: altered balance strategies due to L BKA and generalized weakness/deconditioning Finger Nose Finger Test: [facility] Heel Shin Test: unable to perform on LLE due to BKA Motor  Motor Motor: Abnormal postural alignment and control Motor - Skilled Clinical Observations: altered balance strategies due to L BKA and generalized weakness/deconditioning Mobility  Bed Mobility Bed Mobility:  Rolling Right;Rolling Left;Sit to Supine;Supine to Sit Rolling Right: Independent with assistive device Rolling Left: Independent with assistive device Supine to Sit: Independent with assistive device Sit to Supine: Independent with assistive device Transfers Sit to Stand: Supervision/Verbal cueing Stand to Sit: Supervision/Verbal cueing  Trunk/Postural Assessment  Cervical Assessment Cervical Assessment: Within Functional Limits Thoracic Assessment Thoracic Assessment: Within Functional Limits Lumbar Assessment Lumbar Assessment: Exceptions to [facility] (posterior pelvic tilt) Postural Control Postural Control: Deficits on evaluation Righting Reactions: delayed and inadequate on L due  to BKA - improved since eval Protective Responses: delayed and inadequate on L due to BKA - improved since eval  Balance Balance Balance Assessed: Yes Static Sitting Balance Static Sitting - Balance Support: Feet supported;Bilateral upper extremity supported Static Sitting - Level of Assistance: 7: Independent Dynamic Sitting Balance Dynamic Sitting - Balance Support: Feet supported;No upper extremity supported Dynamic Sitting - Level of Assistance: 6: Modified independent (Device/Increase time) Static Standing Balance Static Standing - Balance Support: Bilateral upper extremity supported;During functional activity (RW) Static Standing - Level of Assistance: 5: Stand by assistance (supervision) Dynamic Standing Balance Dynamic Standing - Balance Support: Bilateral upper extremity supported;During functional activity (RW) Dynamic Standing - Level of Assistance: 5: Stand by assistance (CGA) Extremity/Trunk Assessment RUE Assessment RUE Assessment: Exceptions to [facility] Passive Range of Motion (PROM) Comments: WFL Active Range of Motion (AROM) Comments: WFL General Strength Comments: 4/5 grossly overall, chronic muscle atrophy in hand LUE Assessment LUE Assessment: Exceptions to [facility] Passive Range of Motion  (PROM) Comments: WFL Active Range of Motion (AROM) Comments: WFL General Strength Comments: 4/5 grossly overall, chronic muscle atrophy in hand   Heston Widener E Tinea Nobile, MS, OTR/L  08/12/2023, 3:41 PM

## 2023-08-12 NOTE — Progress Notes (Incomplete)
 Inpatient Rehabilitation Discharge Medication Review by a Pharmacist  A complete drug regimen review was completed for this patient to identify any potential clinically significant medication issues.  High Risk Drug Classes Is patient taking? Indication by Medication  Antipsychotic {Receiving?:26196}   Anticoagulant {Receiving?:26196}   Antibiotic {Receiving?:26196}   Opioid {Receiving?:26196}   Antiplatelet {Receiving?:26196}   Hypoglycemics/insulin  {Yes or No?:26198}   Vasoactive Medication {Receiving?:26196}   Chemotherapy {Receiving Chemo?:26197}   Other {Yes or No?:26198} Diclofenac gel - topical pain control Tirzepatide  - DM Naproxen - PRN pain Rosuvastatin  - HLD Valsartan  - HTN     Type of Medication Issue Identified Description of Issue Recommendation(s)  Drug Interaction(s) (clinically significant)     Duplicate Therapy     Allergy     No Medication Administration End Date     Incorrect Dose     Additional Drug Therapy Needed     Significant med changes from prior encounter (inform family/care partners about these prior to discharge). *** Communicate relevant medication changes to patient/family members at discharge from CIR.   Other       Clinically significant medication issues were identified that warrant physician communication and completion of prescribed/recommended actions by midnight of the next day:  {Yes or No?:26198}  Name of provider notified for urgent issues identified: ***   Provider Method of Notification: ***    Pharmacist comments: ***   Time spent performing this drug regimen review (minutes): 20   Thank you for allowing pharmacy to be a part of this patient's care.  ***

## 2023-08-12 NOTE — Progress Notes (Addendum)
 Physical Therapy Discharge Summary  Patient Details  Name: Scott Navarro. MRN: [id number] Date of Birth: 07/03/1965  Date of Discharge from PT service:August 12, 2023  Today's Date: 08/12/2023 PT Individual Time: 0800-0857 PT Individual Time Calculation (min): 57 min    Patient has met 7 of 9 Edgar term goals due to improved activity tolerance, improved balance, improved postural control, increased strength, increased range of motion, decreased pain, ability to compensate for deficits, improved awareness, and improved coordination. Patient to discharge at a wheelchair level Modified Independent.   Patient's care partner is independent to provide the necessary physical assistance at discharge. Pt's wife attended family education training on 6/9 and verbalized and demonstrated confidence with all tasks to ensure safe discharge home.   Reasons goals not met: Pt did not meet ambulation goals as pt is currently not able to ambulate due to weakness/deconditioning, decreased ability to clear R foot, and decreased balance/coordination.   Recommendation:  Patient will benefit from ongoing skilled PT services in outpatient setting to continue to advance safe functional mobility, address ongoing impairments in transfers, generalized strengthening and endurance, dynamic standing balance/coordination, NMR, and to minimize fall risk.  Equipment: RW  Reasons for discharge: treatment goals met and discharge from hospital  Patient/family agrees with progress made and goals achieved: Yes  Today's Interventions Received pt sitting in WC, pt agreeable to PT treatment, and denied any pain during session. Session with emphasis on discharge planning, functional mobility/transfers, generalized strengthening and endurance, dynamic standing balance/coordination, and NMR.  Went through sensation, MMT, and pain interference questionnaire in preparation for discharge. Pt able to don/doff limb guard mod I. Pt then  performed WC mobility 126ft x 2 trials with BUE and mod I to/from main therapy gym with emphasis on UE strength/coordination. Stood in // bars with supervision and performed standing R heel raises 4x10 with emphasis on R calf strength. Transitioned to standing RLE "hops" 3x6 reps with heavy reliance on BUE support with CGA/light min A for balance. Pt with improvements in ability to boost up, control RLE landing, clear R foot, and prevent R foot from tucking under WC.   Pt able to set up transfer to/from mat mod I (including managing legrests, armrests, and positioning WC) and perform lateral scoot to/from mat mod I. Pt performed modified crunches on wedge 3x10 without UE assist with emphasis on core strength. Transitioned to seated lateral trunk rotations 2x10 bilaterally with 4lb medicine ball increasing to 6.6lb medicine ball 2x10 bilaterally. Returned to room and concluded session with pt sitting in [facility] with all needs within reach. Pt made mod I in room.   PT Discharge Precautions/Restrictions Precautions Precautions: Fall Restrictions Weight Bearing Restrictions Per Provider Order: Yes LLE Weight Bearing Per Provider Order: Non weight bearing Other Position/Activity Restrictions: LLE limb guard Pain Interference Pain Interference Pain Effect on Sleep: 1. Rarely or not at all Pain Interference with Therapy Activities: 1. Rarely or not at all Pain Interference with Day-to-Day Activities: 1. Rarely or not at all Cognition Overall Cognitive Status: Within Functional Limits for tasks assessed Arousal/Alertness: Awake/alert Orientation Level: Oriented X4 Memory: Appears intact Awareness: Appears intact Problem Solving: Appears intact Safety/Judgment: Appears intact Sensation Sensation Light Touch: Impaired Detail Light Touch Impaired Details: Impaired RLE;Impaired LLE Hot/Cold: Not tested Proprioception: Impaired by gross assessment Stereognosis: Not tested Additional Comments: absent  sensation along R great toe and R lateral malleoli; decreased sensation along distal aspect of L residual limb. Coordination Gross Motor Movements are Fluid and Coordinated: No Fine  Motor Movements are Fluid and Coordinated: Yes Coordination and Movement Description: altered balance strategies due to L BKA and generalized weakness/deconditioning Finger Nose Finger Test: [facility] Heel Shin Test: unable to perform on LLE due to BKA Motor  Motor Motor: Abnormal postural alignment and control Motor - Skilled Clinical Observations: altered balance strategies due to L BKA and generalized weakness/deconditioning  Mobility Bed Mobility Bed Mobility: Rolling Right;Rolling Left;Sit to Supine;Supine to Sit Rolling Right: Independent with assistive device Rolling Left: Independent with assistive device Supine to Sit: Independent with assistive device Sit to Supine: Independent with assistive device Transfers Transfers: Sit to Stand;Stand to Sit;Squat Pivot Transfers;Lateral/Scoot Transfers Sit to Stand: Supervision/Verbal cueing Stand to Sit: Supervision/Verbal cueing Squat Pivot Transfers: Supervision/Verbal cueing Lateral/Scoot Transfers: Independent with assistive device Transfer (Assistive device): None Locomotion  Gait Ambulation: No Gait Gait: No Stairs / Additional Locomotion Stairs: No Wheelchair Mobility Wheelchair Mobility: Yes Wheelchair Assistance: Independent with Scientist, research (life sciences): Both upper extremities Wheelchair Parts Management: Independent Distance: 175ft  Trunk/Postural Assessment  Cervical Assessment Cervical Assessment: Within Functional Limits Thoracic Assessment Thoracic Assessment: Within Functional Limits Lumbar Assessment Lumbar Assessment: Exceptions to [facility] (posterior pelvic tilt) Postural Control Postural Control: Deficits on evaluation Righting Reactions: delayed and inadequate on L due to BKA - improved since eval Protective  Responses: delayed and inadequate on L due to BKA - improved since eval  Balance Balance Balance Assessed: Yes Static Sitting Balance Static Sitting - Balance Support: Feet supported;Bilateral upper extremity supported Static Sitting - Level of Assistance: 7: Independent Dynamic Sitting Balance Dynamic Sitting - Balance Support: Feet supported;No upper extremity supported Dynamic Sitting - Level of Assistance: 6: Modified independent (Device/Increase time) Static Standing Balance Static Standing - Balance Support: Bilateral upper extremity supported;During functional activity (RW) Static Standing - Level of Assistance: 5: Stand by assistance (supervision) Dynamic Standing Balance Dynamic Standing - Balance Support: Bilateral upper extremity supported;During functional activity (RW) Dynamic Standing - Level of Assistance: 5: Stand by assistance (CGA) Extremity Assessment  RLE Assessment RLE Assessment: Exceptions to [facility] General Strength Comments: tested sitting in WC RLE Strength Right Hip Flexion: 4-/5 Right Hip ABduction: 4/5 Right Hip ADduction: 4/5 Right Knee Flexion: 4-/5 Right Knee Extension: 4-/5 Right Ankle Dorsiflexion: 2/5 Right Ankle Plantar Flexion: 4-/5 LLE Assessment LLE Assessment: Exceptions to [facility] General Strength Comments: tested sitting in WC LLE Strength Left Hip Flexion: 3+/5 Left Hip ABduction: 4-/5 Left Hip ADduction: 4-/5   [name] Nena Bank PT, DPT 08/12/2023, 7:23 AM

## 2023-08-13 ENCOUNTER — Other Ambulatory Visit (HOSPITAL_COMMUNITY): Payer: Self-pay

## 2023-08-13 LAB — GLUCOSE, CAPILLARY: Glucose-Capillary: 118 mg/dL — ABNORMAL HIGH (ref 70–99)

## 2023-08-13 MED ORDER — ROSUVASTATIN CALCIUM 20 MG PO TABS
20.0000 mg | ORAL_TABLET | Freq: Every day | ORAL | 0 refills | Status: DC
  Filled 2023-08-13: qty 30, 30d supply, fill #0

## 2023-08-13 NOTE — Progress Notes (Signed)
 Patient ID: [name]., male   DOB: Sep 06, 1965, 58 y.o.   MRN: [id number]  SW received updates pt will need RW. SW ordered RW to be delivered to current address: [address] [place] [address].   [name], MSW, LCSW Office: [phone] Cell: [phone] Fax: [phone]

## 2023-08-13 NOTE — Telephone Encounter (Signed)
 Noted

## 2023-08-13 NOTE — Progress Notes (Signed)
 Inpatient Rehabilitation Discharge Medication Review by a Pharmacist  A complete drug regimen review was completed for this patient to identify any potential clinically significant medication issues.  High Risk Drug Classes Is patient taking? Indication by Medication  Antipsychotic No   Anticoagulant No   Antibiotic No   Opioid Yes Oxycodone  prn pain  Antiplatelet No   Hypoglycemics/insulin  Yes Mounjaro  - DM  Vasoactive Medication No   Chemotherapy No   Other Yes Magnesium  gluconate - supplement  Vitamin D  - bones Rosuvastatin  - HLD     Type of Medication Issue Identified Description of Issue Recommendation(s)  Drug Interaction(s) (clinically significant)     Duplicate Therapy     Allergy     No Medication Administration End Date     Incorrect Dose     Additional Drug Therapy Needed     Significant med changes from prior encounter (inform family/care partners about these prior to discharge).    Other       Clinically significant medication issues were identified that warrant physician communication and completion of prescribed/recommended actions by midnight of the next day:  No  Name of provider notified for urgent issues identified:   Provider Method of Notification:     Pharmacist comments:   Time spent performing this drug regimen review (minutes):  20 minutes  Thank you. Lennice Quivers, PharmD

## 2023-08-13 NOTE — Progress Notes (Signed)
 PROGRESS NOTE   Subjective/Complaints: No new concerns. Looking forward to DC home.  He says nursing felt surgical incision looking well when dressing changed this am.   ROS:  Denies chest pain, shortness of breath, abdominal pain, nausea, vomiting, HA  Objective:   No results found. Recent Labs    08/11/23 0525  WBC 9.2  HGB 8.2*  HCT 25.0*  PLT 389   Recent Labs    08/11/23 0525  NA 136  K 4.1  CL 102  CO2 27  GLUCOSE 106*  BUN 33*  CREATININE 1.24  CALCIUM  8.1*      Intake/Output Summary (Last 24 hours) at 08/13/2023 1556 Last data filed at 08/13/2023 0735 Gross per 24 hour  Intake 1440 ml  Output --  Net 1440 ml        Physical Exam: Vital Signs Blood pressure (!) 147/80, pulse (!) 104, temperature 97.9 F (36.6 C), temperature source Oral, resp. rate 18, height 6' (1.829 m), weight 75.2 kg, SpO2 100%.  General: No acute distress, sitting in wheelchair Mood and affect are appropriate Heart: Mild tachycardia Lungs: Clear to auscultation, breathing unlabored, no rales or wheezes Abdomen: Positive bowel sounds, soft nontender to palpation, nondistended Extremities: No clubbing, cyanosis, or edema Getting some redness over the right tibial tuberosity likely from limb guard Comments: Left BKA-covered with dry dressing   Neurological:     Mental Status: He is alert and oriented to person, place, and time. Mental status is at baseline.     Sensory: Mild altered sensation to light touch in his distal feet      Atrophy in intrinsic hand muscles  Physical exam unchanged from the above on reexamination 08/13/23       Assessment/Plan: 1. Functional deficits which require 3+ hours per day of interdisciplinary therapy in a comprehensive inpatient rehab setting. Physiatrist is providing close team supervision and 24 hour management of active medical problems listed below. Physiatrist and rehab team  continue to assess barriers to discharge/monitor patient progress toward functional and medical goals  Care Tool:  Bathing    Body parts bathed by patient: Right arm, Left arm, Chest, Abdomen, Front perineal area, Buttocks, Face, Right upper leg, Left upper leg, Right lower leg   Body parts bathed by helper: Left upper leg, Right upper leg, Right lower leg Body parts n/a: Left lower leg   Bathing assist Assist Level: Independent with assistive device     Upper Body Dressing/Undressing Upper body dressing   What is the patient wearing?: Pull over shirt    Upper body assist Assist Level: Independent with assistive device    Lower Body Dressing/Undressing Lower body dressing      What is the patient wearing?: Underwear/pull up, Pants     Lower body assist Assist for lower body dressing: Independent with assitive device     Toileting Toileting    Toileting assist Assist for toileting: Independent with assistive device     Transfers Chair/bed transfer  Transfers assist     Chair/bed transfer assist level: Independent with assistive device     Locomotion Ambulation   Ambulation assist   Ambulation activity did not occur: Safety/medical concerns (weakness,  decreased balance)          Walk 10 feet activity   Assist  Walk 10 feet activity did not occur: Safety/medical concerns (weakness, decreased balance)        Walk 50 feet activity   Assist Walk 50 feet with 2 turns activity did not occur: Safety/medical concerns (weakness, decreased balance)         Walk 150 feet activity   Assist Walk 150 feet activity did not occur: Safety/medical concerns (weakness, decreased balance)         Walk 10 feet on uneven surface  activity   Assist Walk 10 feet on uneven surfaces activity did not occur: Safety/medical concerns (weakness, decreased balance)         Wheelchair     Assist Is the patient using a wheelchair?: Yes Type of  Wheelchair: Manual    Wheelchair assist level: Independent Max wheelchair distance: 154ft    Wheelchair 50 feet with 2 turns activity    Assist        Assist Level: Independent   Wheelchair 150 feet activity     Assist      Assist Level: Independent   Blood pressure (!) 147/80, pulse (!) 104, temperature 97.9 F (36.6 C), temperature source Oral, resp. rate 18, height 6' (1.829 m), weight 75.2 kg, SpO2 100%.  Medical Problem List and Plan: 1. 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             -patient may not shower             -ELOS/Goals: 12-14 days, PT/OT sup             -Continue CIR             -Dr Julio Ohm note 5/26 indicated that wound VAC can be removed in about a week  Shrinker and limb guard ordered  Continue dry dressing changes  Wound assessed and picture taken for chart   Discussed discharge date and he is content with this  -Will hold off on staple removal for now as a little early, patient will follow-up with orthopedics outpatient- has this scheduled  -DC home today  2. Impaired mobility: d/ced Lovenox  due to bleeding as per ortho recs            3. Pain Management: continue oxycodone  5-10 mg PRN.              -monitor for breakthrough             -tylenol  PRN   4. Tachycardia: magnesium  gluconate 250mg  ordered        5. Neuropsych/cognition: This patient is capable of making decisions on his own behalf.  6. Skin: wound vac removed              - continue zinc  and vitamin C              -Prevalon Boot ordered  May use foam dressing over the tibial tuberosity until limb guard can be padded  7. Fluids/Electrolytes/Nutrition: monitor I/O, check CMET              -encourage fluids(AKI)              continue Juven supplements/ Diet: Carb modified    8. TDM2: Hemoglobin A1c 6.7 (stable) - Monitor B/S use SSI for elevated blood sugar--current BS rang 140-160s -Continue Mounjaro  15 mg subcutaneous injection Friday  (nonformulary requested/wife to bring from home). D/c HS  ISS, d/c daytime ISS, d/c semglee , advised to refuse CBG testing if performed during or after meal- should only be checked before meals Recent Labs    08/12/23 1136 08/12/23 1620 08/13/23 0650  GLUCAP 117* 126* 118*   Controlled 6/11 May use Home Mounjaro   9. HLD: continue Crestor  20 mg daily/pm  10.  HTN/Orthostatic hypotension: Monitor BP's TID -Valsartan  being held (RX note) -check orthostatic VS -reviewed medications and is not on any anti-hypertensives -6/10 BP elevated today but overall has been well-controlled, continue to monitor -6/11 intermittently elevated, hold off increase medication due to concern of hypotension, f/u PCP    08/13/2023    6:59 AM 08/13/2023    5:00 AM 08/12/2023    8:05 PM  Vitals with BMI  Weight  165 lbs 13 oz   BMI  22.48   Systolic 147  160  Diastolic 80  85  Pulse 104  104    11. Anemia: discussed that Hgb has improved to 8.2, monitor weekly  -6/10 HGB stable at 8.2  Has had iron studies and was started on ferrous sulfate  12.  Prolonged Qtc:             - Avoid QTc prolonging medications, monitor electrolytes  13.  AKI:  Cr reviewed and has improved  -Cr and BUN down to 33/1.24, overall improved  14.  Distal muscle atrophy and weakness.  Possible neuropathy related.  Consider outpatient EMG/nerve conduction study  15. Constipation: encouraged fruits and vegetables, continue senna prn HS, last BM 6/8, increase magnesium  gluconate to 500mg  HS  -6/10 LBM today improved  -6/11 LBM yesterday, continue current regimen        LOS: 14 days A FACE TO FACE EVALUATION WAS PERFORMED  Lylia Sand 08/13/2023, 3:56 PM

## 2023-08-13 NOTE — Progress Notes (Signed)
 Inpatient Rehabilitation Care Coordinator Discharge Note   Patient Details  Name: Scott Navarro. MRN: [id number] Date of Birth: [birth year]   Discharge location: D/c to his son's home with family  Length of Stay: 13 days  Discharge activity level: w/c Mod I  Home/community participation: Limited  Patient response WU:JWJXBJ Literacy - How often do you need to have someone help you when you read instructions, pamphlets, or other written material from your doctor or pharmacy?: Rarely  Patient response YN:WGNFAO Isolation - How often do you feel lonely or isolated from those around you?: Rarely  Services provided included: MD, RD, PT, CM, TR, Pharmacy, Neuropsych, SW, SLP, RN, OT  Financial Services:  Financial Services Utilized: Production designer, theatre/television/film  Choices offered to/list presented to: patient  Follow-up services arranged:  DME (therapy is deferred until  prosthesis)      DME : rolling walker with [employer]    Patient response to transportation need: Is the patient able to respond to transportation needs?: Yes In the past 12 months, has lack of transportation kept you from medical appointments or from getting medications?: No In the past 12 months, has lack of transportation kept you from meetings, work, or from getting things needed for daily living?: No   Patient/Family verbalized understanding of follow-up arrangements:  Yes  Individual responsible for coordination of the follow-up plan: contact pt  Confirmed correct DME delivered: Scott Navarro 08/13/2023    Comments (or additional information):  Summary of Stay    Date/Time Discharge Planning CSW  08/07/23 1112 Pt will d/c to home. Pt needs to be as independent as possible due to wife caring for their son who is currnently bed bound. SW will confirm there are no barriers to discharge. AAC       Sierra Spargo A Brendolyn Callas

## 2023-08-19 ENCOUNTER — Encounter: Payer: Self-pay | Admitting: Orthopedic Surgery

## 2023-08-19 ENCOUNTER — Ambulatory Visit: Admitting: Orthopedic Surgery

## 2023-08-19 DIAGNOSIS — M726 Necrotizing fasciitis: Secondary | ICD-10-CM

## 2023-08-19 DIAGNOSIS — S88112A Complete traumatic amputation at level between knee and ankle, left lower leg, initial encounter: Secondary | ICD-10-CM

## 2023-08-19 DIAGNOSIS — Z89512 Acquired absence of left leg below knee: Secondary | ICD-10-CM

## 2023-08-19 NOTE — Progress Notes (Signed)
 Office Visit Note   Patient: Scott Navarro.           Date of Birth: 1965/12/14           MRN: [id number] Visit Date: 08/19/2023              Requested by: Claudene Crystal, PA-C [address] [place],  [place] [address] PCP: Claudene Crystal, PA-C  Chief Complaint  Patient presents with   Left Leg - Routine Post Op    post op left BKA 07/23/2023 and I&D left knee 07/25/2023      HPI: Patient is a 58 year old gentleman who is seen in follow-up for necrotizing fasciitis left lower extremity status post transtibial amputation with fasciotomies that extended into the thigh proximally.    Patient states he feels well he states he has had no pain and has not taken any pain medicine.  Assessment & Plan: Visit Diagnoses:  1. Necrotizing fasciitis (HCC)   2. Below-knee amputation of left lower extremity, initial encounter (HCC)     Plan: Half of the sutures and staples were harvested plan to follow-up in 2 weeks to remove the remainder.  Patient is provided a prescription for [employer] for a smaller stump shrinker and a prosthesis.  Follow-Up Instructions: Return in about 2 weeks (around 09/02/2023).   Ortho Exam  Patient is alert, oriented, no adenopathy, well-dressed, normal affect, normal respiratory effort. Examination patient has full range of motion of his knee he is asymptomatic from a pain standpoint.  There is no redness or cellulitis the incisions are well-healed.  Patient denies any pain.  Patient is a new left transtibial  amputee.  Patient's current comorbidities are not expected to impact the ability to function with the prescribed prosthesis. Patient verbally communicates a strong desire to use a prosthesis. Patient currently requires mobility aids to ambulate without a prosthesis.  Expects not to use mobility aids with a new prosthesis. Patient is expected to resume or reach their K Level within 6 months. Patient was active before the amputation and independent  with stairs, uneven terrain, varying cadence, and a community ambulator.  Patient is a K3 level ambulator that spends a lot of time walking around on uneven terrain over obstacles, up and down stairs, and ambulates with a variable cadence.       Imaging: No results found.       Labs: Lab Results  Component Value Date   HGBA1C 6.7 (H) 07/23/2023   HGBA1C 6.7 (H) 06/10/2023   HGBA1C 8.3 (H) 02/24/2023   ESRSEDRATE 92 (H) 10/09/2016   CRP 2.7 (H) 10/09/2016   REPTSTATUS 07/28/2023 FINAL 07/23/2023   REPTSTATUS 07/28/2023 FINAL 07/23/2023   GRAMSTAIN NO WBC SEEN NO ORGANISMS SEEN  07/23/2023   GRAMSTAIN  07/23/2023    ABUNDANT WBC PRESENT, PREDOMINANTLY PMN NO ORGANISMS SEEN    CULT  07/23/2023    No growth aerobically or anaerobically. Performed at [facility], [address], [place], [place] [address]    CULT  07/23/2023    No growth aerobically or anaerobically. Performed at [facility], [address] [place], [place] [address]    LABORGA PROTEUS MIRABILIS 10/09/2016   LABORGA ENTEROBACTER AEROGENES 10/09/2016     Lab Results  Component Value Date   ALBUMIN  2.1 (L) 07/31/2023   ALBUMIN  2.0 (L) 07/22/2023   ALBUMIN  4.1 06/10/2023   PREALBUMIN 17.8 (L) 10/09/2016    Lab Results  Component Value Date   MG  2.0 07/30/2023   MG 1.8 07/29/2023   MG 1.9 07/24/2023   No results found for: [facility]  Lab Results  Component Value Date   PREALBUMIN 17.8 (L) 10/09/2016      Latest Ref Rng & Units 08/11/2023    2:04 PM 08/11/2023    5:25 AM 08/10/2023    7:34 AM  CBC EXTENDED  WBC 4.0 - 10.5 K/uL  9.2    RBC 4.22 - 5.81 MIL/uL 2.90  2.73    Hemoglobin 13.0 - 17.0 g/dL  8.2  8.4   HCT 40.9 - 52.0 %  25.0  25.5   Platelets 150 - 400 K/uL  389       There is no height or weight on file to calculate BMI.  Orders:  No orders of the defined types were placed in this encounter.  No orders of the defined types were placed in this  encounter.    Procedures: No procedures performed  Clinical Data: No additional findings.  ROS:  All other systems negative, except as noted in the HPI. Review of Systems  Objective: Vital Signs: There were no vitals taken for this visit.  Specialty Comments:  No specialty comments available.  PMFS History: Patient Active Problem List   Diagnosis Date Noted   Coping style affecting medical condition 08/05/2023   S/P BKA (below knee amputation), left (HCC) 07/30/2023   ARF (acute renal failure) (HCC) 07/23/2023   Hyperlipidemia 07/23/2023   Amputation below knee (HCC) 07/23/2023   Necrotizing fasciitis (HCC) 07/22/2023   Anemia 07/22/2023   Hyponatremia 07/22/2023   Diabetic retinopathy associated with type 2 diabetes mellitus (HCC) 02/18/2023   Charcot joint of left foot 02/18/2023   Encounter for lipid screening for cardiovascular disease 02/18/2023   Screening for prostate cancer 02/18/2023   Type 2 diabetes mellitus with hyperglycemia (HCC) 11/28/2022   Gait instability 11/28/2022   Retinal hemorrhage 11/21/2022   Glaucoma 11/21/2022   Achilles tendon contracture, right 11/21/2016   Hypokalemia    Class 1 obesity due to excess calories with body mass index (BMI) of 33.0 to 33.9 in adult    Wound infection 10/09/2016   Hyperglycemia 10/09/2016   Elevated blood pressure reading 10/09/2016   Diabetic polyneuropathy associated with type 2 diabetes mellitus (HCC)    Laceration w FB of right great toe w/o damage to nail, init    Past Medical History:  Diagnosis Date   Type II diabetes mellitus (HCC) dx'd 10/08/2016    Family History  Problem Relation Age of Onset   Diabetes Father    Lung cancer Other    Stroke Other    CAD Neg Hx    Hypertension Neg Hx     Past Surgical History:  Procedure Laterality Date   AMPUTATION Right 10/09/2016   Procedure: INCISION AND DRAINAGE RIGHT GREAT TOE;  Surgeon: Timothy Ford, MD;  Location: [facility] OR;  Service: Orthopedics;   Laterality: Right;   AMPUTATION Left 07/23/2023   Procedure: AMPUTATION, BELOW KNEE, LEFT;  Surgeon: Timothy Ford, MD;  Location[facility] OR;  Service: Orthopedics;  Laterality: Left;  IRRIGATION AND DEBRIDEMENT AND AMPUTATION OF LEFT LEG   IRRIGATION AND DEBRIDEMENT KNEE Left 07/25/2023   Procedure: IRRIGATION AND DEBRIDEMENT LEFT KNEE;  Surgeon: Timothy Ford, MD;  Location: [facility] OR;  Service: Orthopedics;  Laterality: Left;  DEBRIDEMENT LEFT LEG   Social History   Occupational History   Not on file  Tobacco Use   Smoking status: Never   Smokeless  tobacco: Never  Vaping Use   Vaping status: Never Used  Substance and Sexual Activity   Alcohol use: No   Drug use: No   Sexual activity: Not Currently

## 2023-08-23 ENCOUNTER — Emergency Department (HOSPITAL_BASED_OUTPATIENT_CLINIC_OR_DEPARTMENT_OTHER)
Admission: EM | Admit: 2023-08-23 | Discharge: 2023-08-24 | Disposition: A | Discharge status: Home / Self Care | Attending: Emergency Medicine | Admitting: Emergency Medicine

## 2023-08-23 ENCOUNTER — Emergency Department (HOSPITAL_BASED_OUTPATIENT_CLINIC_OR_DEPARTMENT_OTHER): Admitting: Radiology

## 2023-08-23 ENCOUNTER — Encounter (HOSPITAL_BASED_OUTPATIENT_CLINIC_OR_DEPARTMENT_OTHER): Payer: Self-pay

## 2023-08-23 ENCOUNTER — Other Ambulatory Visit: Payer: Self-pay

## 2023-08-23 DIAGNOSIS — S41142A Puncture wound with foreign body of left upper arm, initial encounter: Secondary | ICD-10-CM | POA: Insufficient documentation

## 2023-08-23 DIAGNOSIS — Y92000 Kitchen of unspecified non-institutional (private) residence as  the place of occurrence of the external cause: Secondary | ICD-10-CM | POA: Diagnosis not present

## 2023-08-23 DIAGNOSIS — W260XXA Contact with knife, initial encounter: Secondary | ICD-10-CM | POA: Insufficient documentation

## 2023-08-23 DIAGNOSIS — F0390 Unspecified dementia without behavioral disturbance: Secondary | ICD-10-CM | POA: Insufficient documentation

## 2023-08-23 DIAGNOSIS — S41112A Laceration without foreign body of left upper arm, initial encounter: Secondary | ICD-10-CM

## 2023-08-23 DIAGNOSIS — E119 Type 2 diabetes mellitus without complications: Secondary | ICD-10-CM | POA: Insufficient documentation

## 2023-08-23 DIAGNOSIS — S4992XA Unspecified injury of left shoulder and upper arm, initial encounter: Secondary | ICD-10-CM | POA: Diagnosis present

## 2023-08-23 MED ORDER — LIDOCAINE-EPINEPHRINE (PF) 1 %-1:200000 IJ SOLN
20.0000 mL | Freq: Once | INTRAMUSCULAR | Status: DC
  Filled 2023-08-23: qty 30
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
## 2023-09-01 ENCOUNTER — Ambulatory Visit (INDEPENDENT_AMBULATORY_CARE_PROVIDER_SITE_OTHER): Admitting: Orthopedic Surgery

## 2023-09-01 ENCOUNTER — Encounter: Payer: Self-pay | Admitting: Orthopedic Surgery

## 2023-09-01 DIAGNOSIS — Z89512 Acquired absence of left leg below knee: Secondary | ICD-10-CM

## 2023-09-01 DIAGNOSIS — S88112A Complete traumatic amputation at level between knee and ankle, left lower leg, initial encounter: Secondary | ICD-10-CM

## 2023-09-01 DIAGNOSIS — M726 Necrotizing fasciitis: Secondary | ICD-10-CM

## 2023-09-01 NOTE — Progress Notes (Signed)
 Office Visit Note   Patient: Scott Navarro.           Date of Birth: November 15, 1965           MRN: [id number] Visit Date: 09/01/2023              Requested by: Bulah Alm RAMAN, PA-C [address] [place],  [place] [address] PCP: Bulah Alm RAMAN, PA-C  Chief Complaint  Patient presents with   Left Leg - Routine Post Op    post op left BKA 07/23/2023 and I&D left knee 07/25/2023      HPI: Patient is a 58 year old gentleman who presents status post left below-knee amputation and debridement left thigh for necrotizing fasciitis.  He is about 5 weeks out.  Assessment & Plan: Visit Diagnoses:  1. Necrotizing fasciitis (HCC)   2. Below-knee amputation of left lower extremity, initial encounter [facility])     Plan: Staples are harvested prescription provided for [employer] for smaller shrinker's.  Follow-Up Instructions: Return in about 3 weeks (around 09/22/2023).   Ortho Exam  Patient is alert, oriented, no adenopathy, well-dressed, normal affect, normal respiratory effort. Examination the incision is healing well there is no cellulitis.  Patient is a new left transtibial  amputee.  Patient's current comorbidities are not expected to impact the ability to function with the prescribed prosthesis. Patient verbally communicates a strong desire to use a prosthesis. Patient currently requires mobility aids to ambulate without a prosthesis.  Expects not to use mobility aids with a new prosthesis. Patient is expected to resume or reach their K Level within 6 months. Patient was active before the amputation and independent with stairs, uneven terrain, varying cadence, and a community ambulator.  Patient is a K3 level ambulator that spends a lot of time walking around on uneven terrain over obstacles, up and down stairs, and ambulates with a variable cadence.       Imaging: No results found.    Labs: Lab Results  Component Value Date   HGBA1C 6.7 (H) 07/23/2023   HGBA1C 6.7  (H) 06/10/2023   HGBA1C 8.3 (H) 02/24/2023   ESRSEDRATE 92 (H) 10/09/2016   CRP 2.7 (H) 10/09/2016   REPTSTATUS 07/28/2023 FINAL 07/23/2023   REPTSTATUS 07/28/2023 FINAL 07/23/2023   GRAMSTAIN NO WBC SEEN NO ORGANISMS SEEN  07/23/2023   GRAMSTAIN  07/23/2023    ABUNDANT WBC PRESENT, PREDOMINANTLY PMN NO ORGANISMS SEEN    CULT  07/23/2023    No growth aerobically or anaerobically. Performed at [facility], [address], [place], [place] [address]    CULT  07/23/2023    No growth aerobically or anaerobically. Performed at [facility], [address] [place], [place] [address]    LABORGA PROTEUS MIRABILIS 10/09/2016   LABORGA ENTEROBACTER AEROGENES 10/09/2016     Lab Results  Component Value Date   ALBUMIN  2.1 (L) 07/31/2023   ALBUMIN  2.0 (L) 07/22/2023   ALBUMIN  4.1 06/10/2023   PREALBUMIN 17.8 (L) 10/09/2016    Lab Results  Component Value Date   MG 2.0 07/30/2023   MG 1.8 07/29/2023   MG 1.9 07/24/2023   No results found for: [facility]  Lab Results  Component Value Date   PREALBUMIN 17.8 (L) 10/09/2016      Latest Ref Rng & Units 08/11/2023    2:04 PM 08/11/2023    5:25 AM 08/10/2023    7:34 AM  CBC EXTENDED  WBC 4.0 - 10.5 K/uL  9.2  RBC 4.22 - 5.81 MIL/uL 2.90  2.73    Hemoglobin 13.0 - 17.0 g/dL  8.2  8.4   HCT 60.9 - 52.0 %  25.0  25.5   Platelets 150 - 400 K/uL  389       There is no height or weight on file to calculate BMI.  Orders:  No orders of the defined types were placed in this encounter.  No orders of the defined types were placed in this encounter.    Procedures: No procedures performed  Clinical Data: No additional findings.  ROS:  All other systems negative, except as noted in the HPI. Review of Systems  Objective: Vital Signs: There were no vitals taken for this visit.  Specialty Comments:  No specialty comments available.  PMFS History: Patient Active Problem List   Diagnosis Date Noted   Coping  style affecting medical condition 08/05/2023   S/P BKA (below knee amputation), left (HCC) 07/30/2023   ARF (acute renal failure) (HCC) 07/23/2023   Hyperlipidemia 07/23/2023   Amputation below knee (HCC) 07/23/2023   Necrotizing fasciitis (HCC) 07/22/2023   Anemia 07/22/2023   Hyponatremia 07/22/2023   Diabetic retinopathy associated with type 2 diabetes mellitus (HCC) 02/18/2023   Charcot joint of left foot 02/18/2023   Encounter for lipid screening for cardiovascular disease 02/18/2023   Screening for prostate cancer 02/18/2023   Type 2 diabetes mellitus with hyperglycemia (HCC) 11/28/2022   Gait instability 11/28/2022   Retinal hemorrhage 11/21/2022   Glaucoma 11/21/2022   Achilles tendon contracture, right 11/21/2016   Hypokalemia    Class 1 obesity due to excess calories with body mass index (BMI) of 33.0 to 33.9 in adult    Wound infection 10/09/2016   Hyperglycemia 10/09/2016   Elevated blood pressure reading 10/09/2016   Diabetic polyneuropathy associated with type 2 diabetes mellitus (HCC)    Laceration w FB of right great toe w/o damage to nail, init    Past Medical History:  Diagnosis Date   Type II diabetes mellitus (HCC) dx'd 10/08/2016    Family History  Problem Relation Age of Onset   Diabetes Father    Lung cancer Other    Stroke Other    CAD Neg Hx    Hypertension Neg Hx     Past Surgical History:  Procedure Laterality Date   AMPUTATION Right 10/09/2016   Procedure: INCISION AND DRAINAGE RIGHT GREAT TOE;  Surgeon: Harden Jerona GAILS, MD;  Location: [facility] OR;  Service: Orthopedics;  Laterality: Right;   AMPUTATION Left 07/23/2023   Procedure: AMPUTATION, BELOW KNEE, LEFT;  Surgeon: Harden Jerona GAILS, MD;  Location: [facility] OR;  Service: Orthopedics;  Laterality: Left;  IRRIGATION AND DEBRIDEMENT AND AMPUTATION OF LEFT LEG   IRRIGATION AND DEBRIDEMENT KNEE Left 07/25/2023   Procedure: IRRIGATION AND DEBRIDEMENT LEFT KNEE;  Surgeon: Harden Jerona GAILS, MD;  Location: [facility] OR;   Service: Orthopedics;  Laterality: Left;  DEBRIDEMENT LEFT LEG   Social History   Occupational History   Not on file  Tobacco Use   Smoking status: Never   Smokeless tobacco: Never  Vaping Use   Vaping status: Never Used  Substance and Sexual Activity   Alcohol use: No   Drug use: No   Sexual activity: Not Currently

## 2023-09-02 ENCOUNTER — Encounter: Attending: Physical Medicine and Rehabilitation | Admitting: Physical Medicine and Rehabilitation

## 2023-09-02 ENCOUNTER — Encounter: Payer: Self-pay | Admitting: Physical Medicine and Rehabilitation

## 2023-09-02 VITALS — BP 108/68 | HR 106 | Ht 72.0 in

## 2023-09-02 DIAGNOSIS — R000 Tachycardia, unspecified: Secondary | ICD-10-CM | POA: Diagnosis not present

## 2023-09-02 DIAGNOSIS — M62549 Muscle wasting and atrophy, not elsewhere classified, unspecified hand: Secondary | ICD-10-CM | POA: Diagnosis not present

## 2023-09-02 DIAGNOSIS — Z89512 Acquired absence of left leg below knee: Secondary | ICD-10-CM | POA: Insufficient documentation

## 2023-09-02 NOTE — Progress Notes (Signed)
 Subjective:    Patient ID: Scott Navarro., male    DOB: 02/25/1966, 58 y.o.   MRN: [id number]  HPI Mrs. Dragan is a 58 year old man who presents for follow-up of L BKA  1) L BKA -has been independent -is saving his therapy for when he gets his prosthesis -pain is 0/10 -has PCP Dr. Bulah  2) Tachycardia -does not always run high   Pain Inventory Average Pain 0 Pain Right Now 0 My pain is No pain 0  In the last 24 hours, has pain interfered with the following? General activity 0 Relation with others 0 Enjoyment of life 0 What TIME of day is your pain at its worst? varies NO PAIN Sleep (in general) Good  Pain is worse with: no pain Pain improves with: no pain Relief from Meds: taking Aleve when need with 100% relief.  ability to climb steps?  no do you drive?  no use a wheelchair transfers alone Do you have any goals in this area?  yes  retired Do you have any goals in this area?  yes  trouble walking  Any changes since last visit?  yes ER visit 08/2025 upper left arm wound  Any changes since last visit?  no    Family History  Problem Relation Age of Onset   Diabetes Father    Lung cancer Other    Stroke Other    CAD Neg Hx    Hypertension Neg Hx    Social History   Socioeconomic History   Marital status: Married    Spouse name: Not on file   Number of children: Not on file   Years of education: Not on file   Highest education level: Not on file  Occupational History   Not on file  Tobacco Use   Smoking status: Never   Smokeless tobacco: Never  Vaping Use   Vaping status: Never Used  Substance and Sexual Activity   Alcohol use: No   Drug use: No   Sexual activity: Not Currently  Other Topics Concern   Not on file  Social History Narrative   Not on file   Social Drivers of Health   Financial Resource Strain: Not on file  Food Insecurity: No Food Insecurity (07/23/2023)   Hunger Vital Sign    Worried About Running Out of Food in  the Last Year: Never true    Ran Out of Food in the Last Year: Never true  Transportation Needs: No Transportation Needs (07/23/2023)   PRAPARE - Administrator, Civil Service (Medical): No    Lack of Transportation (Non-Medical): No  Physical Activity: Not on file  Stress: Not on file  Social Connections: Not on file   Past Surgical History:  Procedure Laterality Date   AMPUTATION Right 10/09/2016   Procedure: INCISION AND DRAINAGE RIGHT GREAT TOE;  Surgeon: Harden Jerona GAILS, MD;  Location: [facility] OR;  Service: Orthopedics;  Laterality: Right;   AMPUTATION Left 07/23/2023   Procedure: AMPUTATION, BELOW KNEE, LEFT;  Surgeon: Harden Jerona GAILS, MD;  Location: [facility] OR;  Service: Orthopedics;  Laterality: Left;  IRRIGATION AND DEBRIDEMENT AND AMPUTATION OF LEFT LEG   IRRIGATION AND DEBRIDEMENT KNEE Left 07/25/2023   Procedure: IRRIGATION AND DEBRIDEMENT LEFT KNEE;  Surgeon: Harden Jerona GAILS, MD;  Location: [facility] OR;  Service: Orthopedics;  Laterality: Left;  DEBRIDEMENT LEFT LEG   Past Medical History:  Diagnosis Date   Type II diabetes mellitus (HCC) dx'd 10/08/2016  BP 108/68   Pulse (!) 106   Ht 6' (1.829 m)   SpO2 95%   BMI 22.48 kg/m   Opioid Risk Score:   Fall Risk Score:  `1  Depression screen [facility] 2/9     05/15/2023    8:29 AM 11/21/2022    2:11 PM 10/08/2016    1:31 PM  Depression screen PHQ 2/9  Decreased Interest 0 0 0  Down, Depressed, Hopeless 0 0 0  PHQ - 2 Score 0 0 0    Review of Systems  Musculoskeletal:  Positive for gait problem.  Skin:  Positive for wound.       Left BKA, left arm wound  All other systems reviewed and are negative.      Objective:   Physical Exam Gen: no distress, normal appearing HEENT: oral mucosa pink and moist, NCAT Cardio: Reg rate Chest: normal effort, normal rate of breathing Abd: soft, non-distended Ext: no edema Psych: pleasant, normal affect Skin: intact Neuro: Alert and oriented x3 MSK: atrophy of bilateral hands, left BKA  in limb guard      Assessment & Plan:  1) L BKA -discussed that he is saving his therapy for after he gets his prosthesis -discussed that he saw Dr. Harden yesterday and that everything was healing well -discussed that he has no pain -discussed that he has been eating a healthy diet  2) Tachycardia: -discussed that this is not new for him  3) Bilateral hand atrophy: -discussed EMG/NCS

## 2023-09-03 ENCOUNTER — Ambulatory Visit (INDEPENDENT_AMBULATORY_CARE_PROVIDER_SITE_OTHER): Admitting: Family

## 2023-09-03 ENCOUNTER — Encounter: Payer: Self-pay | Admitting: Family

## 2023-09-03 DIAGNOSIS — T8781 Dehiscence of amputation stump: Secondary | ICD-10-CM

## 2023-09-03 DIAGNOSIS — Z89512 Acquired absence of left leg below knee: Secondary | ICD-10-CM

## 2023-09-03 MED ORDER — DOXYCYCLINE HYCLATE 100 MG PO TABS: 100.0000 mg | ORAL_TABLET | Freq: Two times a day (BID) | ORAL | 0 refills | Status: DC

## 2023-09-03 NOTE — Progress Notes (Signed)
 Post-Op Visit Note   Patient: Scott Navarro.           Date of Birth: 1965-11-02           MRN: [id number] Visit Date: 09/03/2023 PCP: Bulah Alm RAMAN, PA-C  Chief Complaint:  Chief Complaint  Patient presents with   Left Leg - Wound Check    HPI:  HPI The patient is a 57 year old gentleman who is seen status post left below-knee amputation as well as debridement of the left thigh for necrotizing fasciitis last irrigation debridement May 23 Ortho Exam  On examination left lower extremity the medial thigh incision is well-healed the lateral aspect of his below-knee amputation has dehisced this is open a length of 5 cm this is 1.5 centimeters deep there is hematoma in the wound bed debrided with gauze there is no exposed bone today Visit Diagnoses: No diagnosis found.  Plan: will begin packing open with 4 x 4's Ace wrap continue limb protector and shrinker plan follow-up in 10 days as scheduled encouraged him to call or return for any worsening or concern  Follow-Up Instructions: No follow-ups on file.   Imaging: No results found.  Orders:  No orders of the defined types were placed in this encounter.  No orders of the defined types were placed in this encounter.    PMFS History: Patient Active Problem List   Diagnosis Date Noted   Coping style affecting medical condition 08/05/2023   S/P BKA (below knee amputation), left (HCC) 07/30/2023   ARF (acute renal failure) (HCC) 07/23/2023   Hyperlipidemia 07/23/2023   Amputation below knee (HCC) 07/23/2023   Necrotizing fasciitis (HCC) 07/22/2023   Anemia 07/22/2023   Hyponatremia 07/22/2023   Diabetic retinopathy associated with type 2 diabetes mellitus (HCC) 02/18/2023   Charcot joint of left foot 02/18/2023   Encounter for lipid screening for cardiovascular disease 02/18/2023   Screening for prostate cancer 02/18/2023   Type 2 diabetes mellitus with hyperglycemia (HCC) 11/28/2022   Gait instability 11/28/2022    Retinal hemorrhage 11/21/2022   Glaucoma 11/21/2022   Achilles tendon contracture, right 11/21/2016   Hypokalemia    Class 1 obesity due to excess calories with body mass index (BMI) of 33.0 to 33.9 in adult    Wound infection 10/09/2016   Hyperglycemia 10/09/2016   Elevated blood pressure reading 10/09/2016   Diabetic polyneuropathy associated with type 2 diabetes mellitus (HCC)    Laceration w FB of right great toe w/o damage to nail, init    Past Medical History:  Diagnosis Date   Type II diabetes mellitus (HCC) dx'd 10/08/2016    Family History  Problem Relation Age of Onset   Diabetes Father    Lung cancer Other    Stroke Other    CAD Neg Hx    Hypertension Neg Hx     Past Surgical History:  Procedure Laterality Date   AMPUTATION Right 10/09/2016   Procedure: INCISION AND DRAINAGE RIGHT GREAT TOE;  Surgeon: Harden Jerona GAILS, MD;  Location: [facility] OR;  Service: Orthopedics;  Laterality: Right;   AMPUTATION Left 07/23/2023   Procedure: AMPUTATION, BELOW KNEE, LEFT;  Surgeon: Harden Jerona GAILS, MD;  Location: [facility] OR;  Service: Orthopedics;  Laterality: Left;  IRRIGATION AND DEBRIDEMENT AND AMPUTATION OF LEFT LEG   IRRIGATION AND DEBRIDEMENT KNEE Left 07/25/2023   Procedure: IRRIGATION AND DEBRIDEMENT LEFT KNEE;  Surgeon: Harden Jerona GAILS, MD;  Location: [facility] OR;  Service: Orthopedics;  Laterality: Left;  DEBRIDEMENT LEFT LEG  Social History   Occupational History   Not on file  Tobacco Use   Smoking status: Never   Smokeless tobacco: Never  Vaping Use   Vaping status: Never Used  Substance and Sexual Activity   Alcohol use: No   Drug use: No   Sexual activity: Not Currently

## 2023-09-17 ENCOUNTER — Encounter: Payer: Self-pay | Admitting: Physician Assistant

## 2023-09-17 ENCOUNTER — Ambulatory Visit (INDEPENDENT_AMBULATORY_CARE_PROVIDER_SITE_OTHER): Admitting: Physician Assistant

## 2023-09-17 DIAGNOSIS — M726 Necrotizing fasciitis: Secondary | ICD-10-CM

## 2023-09-17 DIAGNOSIS — T8781 Dehiscence of amputation stump: Secondary | ICD-10-CM

## 2023-09-17 DIAGNOSIS — Z89512 Acquired absence of left leg below knee: Secondary | ICD-10-CM

## 2023-09-17 NOTE — Progress Notes (Signed)
 Office Visit Note   Patient: Scott Navarro.           Date of Birth: [birth year]           MRN: [id number] Visit Date: 09/17/2023              Requested by: Bulah Alm RAMAN, PA-C [address] [place],  [place] [address] PCP: Bulah Alm RAMAN, PA-C  Chief Complaint  Patient presents with   Left Leg - Routine Post Op    Left BKA      HPI: Merritt Mccravy. is a 58 y.o. male who presents with necrotic wound left foot.  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  Patient has a history of uncontrolled type 2 diabetes.   Necrotizing fasciitis left thigh left leg left foot. S/p Left BKA.  Followed by return to the OR 07/25/23 for revision left BKA Irrigation debridement necrotizing fasciitis left thigh medially and laterally.  Staples were removed in the office 09/01/23.  He returned for follow up on 09/03/23 medial incision was healing well, but laterally the incision dehisced with an open area measuring 5 cm x 1.5 cm.  Wound packing and ace wrap compression.   He is here today for follow up exam.  Assessment & Plan: Visit Diagnoses: No diagnosis found.  Plan: wet to dry Vashe dressing covered with dry guaze and ace wrap compression.  Black stump sock on top 3 XL.  He will re schedule his [employer] prosthetic appointment once the stump heals.  Follow-Up Instructions: Return in about 1 week (around 09/24/2023).   Ortho Exam  Patient is alert, oriented, no adenopathy, well-dressed, normal affect, normal respiratory effort. Vashe 4 x 4 wound cleansed.  50 % granulation tissue with tunneling under the superior skin flap depth 1.5 cm, 7 cm x 3 cm.  No drainage or cellulitis.  Continue Doxycycline  BID 100 mg until completed.      Imaging:   Labs: Lab Results  Component Value Date   HGBA1C 6.7 (H) 07/23/2023   HGBA1C 6.7 (H) 06/10/2023   HGBA1C 8.3 (H) 02/24/2023   ESRSEDRATE 92 (H) 10/09/2016   CRP 2.7 (H) 10/09/2016   REPTSTATUS 07/28/2023 FINAL  07/23/2023   REPTSTATUS 07/28/2023 FINAL 07/23/2023   GRAMSTAIN NO WBC SEEN NO ORGANISMS SEEN  07/23/2023   GRAMSTAIN  07/23/2023    ABUNDANT WBC PRESENT, PREDOMINANTLY PMN NO ORGANISMS SEEN    CULT  07/23/2023    No growth aerobically or anaerobically. Performed at [facility], [address], [place], [place] [address]    CULT  07/23/2023    No growth aerobically or anaerobically. Performed at [facility], [address] [place], [place] [address]    LABORGA PROTEUS MIRABILIS 10/09/2016   LABORGA ENTEROBACTER AEROGENES 10/09/2016     Lab Results  Component Value Date   ALBUMIN  2.1 (L) 07/31/2023   ALBUMIN  2.0 (L) 07/22/2023   ALBUMIN  4.1 06/10/2023   PREALBUMIN 17.8 (L) 10/09/2016    Lab Results  Component Value Date   MG 2.0 07/30/2023   MG 1.8 07/29/2023   MG 1.9 07/24/2023   No results found for: [facility]  Lab Results  Component Value Date   PREALBUMIN 17.8 (L) 10/09/2016      Latest Ref Rng & Units 08/11/2023    2:04 PM 08/11/2023    5:25 AM 08/10/2023    7:34 AM  CBC EXTENDED  WBC 4.0 - 10.5 K/uL  9.2    RBC 4.22 - 5.81 MIL/uL 2.90  2.73    Hemoglobin 13.0 - 17.0 g/dL  8.2  8.4   HCT 60.9 - 52.0 %  25.0  25.5   Platelets 150 - 400 K/uL  389       There is no height or weight on file to calculate BMI.  Orders:  No orders of the defined types were placed in this encounter.  No orders of the defined types were placed in this encounter.    Procedures: No procedures performed  Clinical Data: No additional findings.  ROS:  All other systems negative, except as noted in the HPI. Review of Systems  Objective: Vital Signs: There were no vitals taken for this visit.  Specialty Comments:  No specialty comments available.  PMFS History: Patient Active Problem List   Diagnosis Date Noted   Coping style affecting medical condition 08/05/2023   S/P BKA (below knee amputation), left (HCC) 07/30/2023   ARF (acute renal failure)  (HCC) 07/23/2023   Hyperlipidemia 07/23/2023   Amputation below knee (HCC) 07/23/2023   Necrotizing fasciitis (HCC) 07/22/2023   Anemia 07/22/2023   Hyponatremia 07/22/2023   Diabetic retinopathy associated with type 2 diabetes mellitus (HCC) 02/18/2023   Charcot joint of left foot 02/18/2023   Encounter for lipid screening for cardiovascular disease 02/18/2023   Screening for prostate cancer 02/18/2023   Type 2 diabetes mellitus with hyperglycemia (HCC) 11/28/2022   Gait instability 11/28/2022   Retinal hemorrhage 11/21/2022   Glaucoma 11/21/2022   Achilles tendon contracture, right 11/21/2016   Hypokalemia    Class 1 obesity due to excess calories with body mass index (BMI) of 33.0 to 33.9 in adult    Wound infection 10/09/2016   Hyperglycemia 10/09/2016   Elevated blood pressure reading 10/09/2016   Diabetic polyneuropathy associated with type 2 diabetes mellitus (HCC)    Laceration w FB of right great toe w/o damage to nail, init    Past Medical History:  Diagnosis Date   Type II diabetes mellitus (HCC) dx'd 10/08/2016    Family History  Problem Relation Age of Onset   Diabetes Father    Lung cancer Other    Stroke Other    CAD Neg Hx    Hypertension Neg Hx     Past Surgical History:  Procedure Laterality Date   AMPUTATION Right 10/09/2016   Procedure: INCISION AND DRAINAGE RIGHT GREAT TOE;  Surgeon: Harden Jerona GAILS, MD;  Location: [facility] OR;  Service: Orthopedics;  Laterality: Right;   AMPUTATION Left 07/23/2023   Procedure: AMPUTATION, BELOW KNEE, LEFT;  Surgeon: Harden Jerona GAILS, MD;  Location[facility] OR;  Service: Orthopedics;  Laterality: Left;  IRRIGATION AND DEBRIDEMENT AND AMPUTATION OF LEFT LEG   IRRIGATION AND DEBRIDEMENT KNEE Left 07/25/2023   Procedure: IRRIGATION AND DEBRIDEMENT LEFT KNEE;  Surgeon: Harden Jerona GAILS, MD;  Location: [facility] OR;  Service: Orthopedics;  Laterality: Left;  DEBRIDEMENT LEFT LEG   Social History   Occupational History   Not on file  Tobacco Use    Smoking status: Never   Smokeless tobacco: Never  Vaping Use   Vaping status: Never Used  Substance and Sexual Activity   Alcohol use: No   Drug use: No   Sexual activity: Not Currently

## 2023-09-22 ENCOUNTER — Ambulatory Visit (INDEPENDENT_AMBULATORY_CARE_PROVIDER_SITE_OTHER): Admitting: Orthopedic Surgery

## 2023-09-22 ENCOUNTER — Encounter: Payer: Self-pay | Admitting: Orthopedic Surgery

## 2023-09-22 DIAGNOSIS — Z89512 Acquired absence of left leg below knee: Secondary | ICD-10-CM

## 2023-09-22 DIAGNOSIS — M726 Necrotizing fasciitis: Secondary | ICD-10-CM

## 2023-09-22 DIAGNOSIS — T8781 Dehiscence of amputation stump: Secondary | ICD-10-CM

## 2023-09-22 NOTE — Progress Notes (Signed)
 Office Visit Note   Patient: Scott Navarro.           Date of Birth: 01-17-1966           MRN: [id number] Visit Date: 09/22/2023              Requested by: Bulah Alm RAMAN, PA-C [address] [place],  [place] [address] PCP: Bulah Alm RAMAN, PA-C  Chief Complaint  Patient presents with   Left Leg - Routine Post Op    07/23/2023 left BKA I&D       HPI: Patient is a 58 year old gentleman who is seen in follow-up status post debridement and transtibial amputation the left for necrotizing fasciitis.  Patient is currently packing the lateral wound with Vashe and is on doxycycline .  Assessment & Plan: Visit Diagnoses:  1. Necrotizing fasciitis (HCC)   2. Dehiscence of amputation stump (HCC)   3. S/P BKA (below knee amputation), left (HCC)     Plan: Continue with the Vashe dressing changes daily.  At follow-up will evaluate for possible placement of donated Kerecis tissue graft  Follow-Up Instructions: Return in about 4 weeks (around 10/20/2023).   Ortho Exam  Patient is alert, oriented, no adenopathy, well-dressed, normal affect, normal respiratory effort. Examination of the medial lateral incisions on the thigh are well-healed.  The lateral aspect of the transtibial amputation is also well-healed.  Medially patient has an area of a previous hematoma that has excellent flat healthy granulation tissue.  It is 2 x 4 cm in diameter and 2 cm deep.    Imaging: No results found. No images are attached to the encounter.  Labs: Lab Results  Component Value Date   HGBA1C 6.7 (H) 07/23/2023   HGBA1C 6.7 (H) 06/10/2023   HGBA1C 8.3 (H) 02/24/2023   ESRSEDRATE 92 (H) 10/09/2016   CRP 2.7 (H) 10/09/2016   REPTSTATUS 07/28/2023 FINAL 07/23/2023   REPTSTATUS 07/28/2023 FINAL 07/23/2023   GRAMSTAIN NO WBC SEEN NO ORGANISMS SEEN  07/23/2023   GRAMSTAIN  07/23/2023    ABUNDANT WBC PRESENT, PREDOMINANTLY PMN NO ORGANISMS SEEN    CULT  07/23/2023    No growth aerobically  or anaerobically. Performed at [facility], [address] [place], [place] [address]    CULT  07/23/2023    No growth aerobically or anaerobically. Performed at [facility], [address], [place], [place] [address]    LABORGA PROTEUS MIRABILIS 10/09/2016   LABORGA ENTEROBACTER AEROGENES 10/09/2016     Lab Results  Component Value Date   ALBUMIN  2.1 (L) 07/31/2023   ALBUMIN  2.0 (L) 07/22/2023   ALBUMIN  4.1 06/10/2023   PREALBUMIN 17.8 (L) 10/09/2016    Lab Results  Component Value Date   MG 2.0 07/30/2023   MG 1.8 07/29/2023   MG 1.9 07/24/2023   No results found for: [facility]  Lab Results  Component Value Date   PREALBUMIN 17.8 (L) 10/09/2016      Latest Ref Rng & Units 08/11/2023    2:04 PM 08/11/2023    5:25 AM 08/10/2023    7:34 AM  CBC EXTENDED  WBC 4.0 - 10.5 K/uL  9.2    RBC 4.22 - 5.81 MIL/uL 2.90  2.73    Hemoglobin 13.0 - 17.0 g/dL  8.2  8.4   HCT 60.9 - 52.0 %  25.0  25.5   Platelets 150 - 400 K/uL  389       There is no height or weight on file  to calculate BMI.  Orders:  No orders of the defined types were placed in this encounter.  No orders of the defined types were placed in this encounter.    Procedures: No procedures performed  Clinical Data: No additional findings.  ROS:  All other systems negative, except as noted in the HPI. Review of Systems  Objective: Vital Signs: There were no vitals taken for this visit.  Specialty Comments:  No specialty comments available.  PMFS History: Patient Active Problem List   Diagnosis Date Noted   Coping style affecting medical condition 08/05/2023   S/P BKA (below knee amputation), left (HCC) 07/30/2023   ARF (acute renal failure) (HCC) 07/23/2023   Hyperlipidemia 07/23/2023   Amputation below knee (HCC) 07/23/2023   Necrotizing fasciitis (HCC) 07/22/2023   Anemia 07/22/2023   Hyponatremia 07/22/2023   Diabetic retinopathy associated with type 2 diabetes mellitus (HCC)  02/18/2023   Charcot joint of left foot 02/18/2023   Encounter for lipid screening for cardiovascular disease 02/18/2023   Screening for prostate cancer 02/18/2023   Type 2 diabetes mellitus with hyperglycemia (HCC) 11/28/2022   Gait instability 11/28/2022   Retinal hemorrhage 11/21/2022   Glaucoma 11/21/2022   Achilles tendon contracture, right 11/21/2016   Hypokalemia    Class 1 obesity due to excess calories with body mass index (BMI) of 33.0 to 33.9 in adult    Wound infection 10/09/2016   Hyperglycemia 10/09/2016   Elevated blood pressure reading 10/09/2016   Diabetic polyneuropathy associated with type 2 diabetes mellitus (HCC)    Laceration w FB of right great toe w/o damage to nail, init    Past Medical History:  Diagnosis Date   Type II diabetes mellitus (HCC) dx'd 10/08/2016    Family History  Problem Relation Age of Onset   Diabetes Father    Lung cancer Other    Stroke Other    CAD Neg Hx    Hypertension Neg Hx     Past Surgical History:  Procedure Laterality Date   AMPUTATION Right 10/09/2016   Procedure: INCISION AND DRAINAGE RIGHT GREAT TOE;  Surgeon: Harden Jerona GAILS, MD;  Location: [facility] OR;  Service: Orthopedics;  Laterality: Right;   AMPUTATION Left 07/23/2023   Procedure: AMPUTATION, BELOW KNEE, LEFT;  Surgeon: Harden Jerona GAILS, MD;  Location[facility] OR;  Service: Orthopedics;  Laterality: Left;  IRRIGATION AND DEBRIDEMENT AND AMPUTATION OF LEFT LEG   IRRIGATION AND DEBRIDEMENT KNEE Left 07/25/2023   Procedure: IRRIGATION AND DEBRIDEMENT LEFT KNEE;  Surgeon: Harden Jerona GAILS, MD;  Location: [facility] OR;  Service: Orthopedics;  Laterality: Left;  DEBRIDEMENT LEFT LEG   Social History   Occupational History   Not on file  Tobacco Use   Smoking status: Never   Smokeless tobacco: Never  Vaping Use   Vaping status: Never Used  Substance and Sexual Activity   Alcohol use: No   Drug use: No   Sexual activity: Not Currently

## 2023-10-23 ENCOUNTER — Ambulatory Visit (INDEPENDENT_AMBULATORY_CARE_PROVIDER_SITE_OTHER): Admitting: Physician Assistant

## 2023-10-23 ENCOUNTER — Encounter: Payer: Self-pay | Admitting: Physician Assistant

## 2023-10-23 DIAGNOSIS — T8781 Dehiscence of amputation stump: Secondary | ICD-10-CM

## 2023-10-23 NOTE — Progress Notes (Signed)
 Office Visit Note   Patient: Scott Navarro.           Date of Birth: [birth year]           MRN: [id number] Visit Date: 10/23/2023              Requested by: Bulah Alm RAMAN, PA-C [address] [place],  [place] [address] PCP: Bulah Alm RAMAN, PA-C  Chief Complaint  Patient presents with   Left Leg - Routine Post Op    07/23/2023 left BKA I&D       HPI: Patient is a 58 year old gentleman who is seen in follow-up status post debridement and transtibial amputation the left for necrotizing fasciitis. Patient is currently packing the lateral wound with Vashe.  He is taking in extra protein with meals to assist with healing.  He is very pleased with his healing progress.    Assessment & Plan: Visit Diagnoses:  1. Dehiscence of amputation stump (HCC)     Plan: Continue with the Vashe dressing changes daily.   Follow-Up Instructions: Return in about 4 weeks (around 11/20/2023).   Ortho Exam  Patient is alert, oriented, no adenopathy, well-dressed, normal affect, normal respiratory effort. The wound measures 3 cm x 1.5 cm without tunneling.  No cellulitis or active drainage.  The stump in NTTP.      Imaging: No results found.    Labs: Lab Results  Component Value Date   HGBA1C 6.7 (H) 07/23/2023   HGBA1C 6.7 (H) 06/10/2023   HGBA1C 8.3 (H) 02/24/2023   ESRSEDRATE 92 (H) 10/09/2016   CRP 2.7 (H) 10/09/2016   REPTSTATUS 07/28/2023 FINAL 07/23/2023   REPTSTATUS 07/28/2023 FINAL 07/23/2023   GRAMSTAIN NO WBC SEEN NO ORGANISMS SEEN  07/23/2023   GRAMSTAIN  07/23/2023    ABUNDANT WBC PRESENT, PREDOMINANTLY PMN NO ORGANISMS SEEN    CULT  07/23/2023    No growth aerobically or anaerobically. Performed at [facility], [address], [place], [place] [address]    CULT  07/23/2023    No growth aerobically or anaerobically. Performed at [facility], [address], [place], [place] [address]    LABORGA PROTEUS MIRABILIS 10/09/2016   LABORGA  ENTEROBACTER AEROGENES 10/09/2016     Lab Results  Component Value Date   ALBUMIN  2.1 (L) 07/31/2023   ALBUMIN  2.0 (L) 07/22/2023   ALBUMIN  4.1 06/10/2023   PREALBUMIN 17.8 (L) 10/09/2016    Lab Results  Component Value Date   MG 2.0 07/30/2023   MG 1.8 07/29/2023   MG 1.9 07/24/2023   No results found for: [facility]  Lab Results  Component Value Date   PREALBUMIN 17.8 (L) 10/09/2016      Latest Ref Rng & Units 08/11/2023    2:04 PM 08/11/2023    5:25 AM 08/10/2023    7:34 AM  CBC EXTENDED  WBC 4.0 - 10.5 K/uL  9.2    RBC 4.22 - 5.81 MIL/uL 2.90  2.73    Hemoglobin 13.0 - 17.0 g/dL  8.2  8.4   HCT 60.9 - 52.0 %  25.0  25.5   Platelets 150 - 400 K/uL  389       There is no height or weight on file to calculate BMI.  Orders:  No orders of the defined types were placed in this encounter.  No orders of the defined types were placed in this encounter.    Procedures: No procedures performed  Clinical Data: No additional findings.  ROS:  All other systems negative, except as noted in the HPI. Review of Systems  Objective: Vital Signs: There were no vitals taken for this visit.  Specialty Comments:  No specialty comments available.  PMFS History: Patient Active Problem List   Diagnosis Date Noted   Coping style affecting medical condition 08/05/2023   S/P BKA (below knee amputation), left (HCC) 07/30/2023   ARF (acute renal failure) (HCC) 07/23/2023   Hyperlipidemia 07/23/2023   Amputation below knee (HCC) 07/23/2023   Necrotizing fasciitis (HCC) 07/22/2023   Anemia 07/22/2023   Hyponatremia 07/22/2023   Diabetic retinopathy associated with type 2 diabetes mellitus (HCC) 02/18/2023   Charcot joint of left foot 02/18/2023   Encounter for lipid screening for cardiovascular disease 02/18/2023   Screening for prostate cancer 02/18/2023   Type 2 diabetes mellitus with hyperglycemia (HCC) 11/28/2022   Gait instability 11/28/2022   Retinal hemorrhage  11/21/2022   Glaucoma 11/21/2022   Achilles tendon contracture, right 11/21/2016   Hypokalemia    Class 1 obesity due to excess calories with body mass index (BMI) of 33.0 to 33.9 in adult    Wound infection 10/09/2016   Hyperglycemia 10/09/2016   Elevated blood pressure reading 10/09/2016   Diabetic polyneuropathy associated with type 2 diabetes mellitus (HCC)    Laceration w FB of right great toe w/o damage to nail, init    Past Medical History:  Diagnosis Date   Type II diabetes mellitus (HCC) dx'd 10/08/2016    Family History  Problem Relation Age of Onset   Diabetes Father    Lung cancer Other    Stroke Other    CAD Neg Hx    Hypertension Neg Hx     Past Surgical History:  Procedure Laterality Date   AMPUTATION Right 10/09/2016   Procedure: INCISION AND DRAINAGE RIGHT GREAT TOE;  Surgeon: Harden Jerona GAILS, MD;  Location: [facility] OR;  Service: Orthopedics;  Laterality: Right;   AMPUTATION Left 07/23/2023   Procedure: AMPUTATION, BELOW KNEE, LEFT;  Surgeon: Harden Jerona GAILS, MD;  Location: [facility] OR;  Service: Orthopedics;  Laterality: Left;  IRRIGATION AND DEBRIDEMENT AND AMPUTATION OF LEFT LEG   IRRIGATION AND DEBRIDEMENT KNEE Left 07/25/2023   Procedure: IRRIGATION AND DEBRIDEMENT LEFT KNEE;  Surgeon: Harden Jerona GAILS, MD;  Location: [facility] OR;  Service: Orthopedics;  Laterality: Left;  DEBRIDEMENT LEFT LEG   Social History   Occupational History   Not on file  Tobacco Use   Smoking status: Never   Smokeless tobacco: Never  Vaping Use   Vaping status: Never Used  Substance and Sexual Activity   Alcohol use: No   Drug use: No   Sexual activity: Not Currently

## 2023-11-20 ENCOUNTER — Encounter: Payer: Self-pay | Admitting: Physician Assistant

## 2023-11-20 ENCOUNTER — Ambulatory Visit: Admitting: Physician Assistant

## 2023-11-20 DIAGNOSIS — Z89512 Acquired absence of left leg below knee: Secondary | ICD-10-CM

## 2023-11-20 NOTE — Progress Notes (Unsigned)
 Office Visit Note   Patient: Scott Navarro.           Date of Birth: 07/08/1965           MRN: [id number] Visit Date: 11/20/2023              Requested by: Bulah Alm RAMAN, PA-C [address] [place],  [place] [address] PCP: Bulah Alm RAMAN, PA-C  Chief Complaint  Patient presents with   Left Leg - Follow-up    07/23/2023 left BKA I&D       HPI: Patient is a 58 year old gentleman who is seen in follow-up status post debridement and transtibial amputation the left for necrotizing fasciitis. Patient is currently packing the lateral wound with Vashe.  He is taking in extra protein with meals to assist with healing.  He is very pleased with his healing progress.    He was seen by [facility] and casted for his prosthesis.    Assessment & Plan: Visit Diagnoses: No diagnosis found.  Plan: Continue with the Vashe dressing changes daily.  PT referral ordered for prosthesis gait training in late Oct.    Follow-Up Instructions: No follow-ups on file.   Ortho Exam  Patient is alert, oriented, no adenopathy, well-dressed, normal affect, normal respiratory effort. Stump appears viable and warm to touch without new ulcers. Debridement of the lateral incision wound with Vashe and 4 x 4.  Wound measures 2 x 1.5 with 0.2 cm depth.  Wet to dry packing with Vashe.  Compression stump socks place over dressing.      Imaging: No results found.   Labs: Lab Results  Component Value Date   HGBA1C 6.7 (H) 07/23/2023   HGBA1C 6.7 (H) 06/10/2023   HGBA1C 8.3 (H) 02/24/2023   ESRSEDRATE 92 (H) 10/09/2016   CRP 2.7 (H) 10/09/2016   REPTSTATUS 07/28/2023 FINAL 07/23/2023   REPTSTATUS 07/28/2023 FINAL 07/23/2023   GRAMSTAIN NO WBC SEEN NO ORGANISMS SEEN  07/23/2023   GRAMSTAIN  07/23/2023    ABUNDANT WBC PRESENT, PREDOMINANTLY PMN NO ORGANISMS SEEN    CULT  07/23/2023    No growth aerobically or anaerobically. Performed at [facility], [address], [place], [place]  [address]    CULT  07/23/2023    No growth aerobically or anaerobically. Performed at [facility], [address] [place], [place] [address]    LABORGA PROTEUS MIRABILIS 10/09/2016   LABORGA ENTEROBACTER AEROGENES 10/09/2016     Lab Results  Component Value Date   ALBUMIN  2.1 (L) 07/31/2023   ALBUMIN  2.0 (L) 07/22/2023   ALBUMIN  4.1 06/10/2023   PREALBUMIN 17.8 (L) 10/09/2016    Lab Results  Component Value Date   MG 2.0 07/30/2023   MG 1.8 07/29/2023   MG 1.9 07/24/2023   No results found for: [facility]  Lab Results  Component Value Date   PREALBUMIN 17.8 (L) 10/09/2016      Latest Ref Rng & Units 08/11/2023    2:04 PM 08/11/2023    5:25 AM 08/10/2023    7:34 AM  CBC EXTENDED  WBC 4.0 - 10.5 K/uL  9.2    RBC 4.22 - 5.81 MIL/uL 2.90  2.73    Hemoglobin 13.0 - 17.0 g/dL  8.2  8.4   HCT 60.9 - 52.0 %  25.0  25.5   Platelets 150 - 400 K/uL  389       There is no height or weight on file to calculate BMI.  Orders:  No orders of the defined types were placed in this encounter.  No orders of the defined types were placed in this encounter.    Procedures: No procedures performed  Clinical Data: No additional findings.  ROS:  All other systems negative, except as noted in the HPI. Review of Systems  Objective: Vital Signs: There were no vitals taken for this visit.  Specialty Comments:  No specialty comments available.  PMFS History: Patient Active Problem List   Diagnosis Date Noted   Coping style affecting medical condition 08/05/2023   S/P BKA (below knee amputation), left (HCC) 07/30/2023   ARF (acute renal failure) (HCC) 07/23/2023   Hyperlipidemia 07/23/2023   Amputation below knee (HCC) 07/23/2023   Necrotizing fasciitis (HCC) 07/22/2023   Anemia 07/22/2023   Hyponatremia 07/22/2023   Diabetic retinopathy associated with type 2 diabetes mellitus (HCC) 02/18/2023   Charcot joint of left foot 02/18/2023   Encounter for lipid screening for  cardiovascular disease 02/18/2023   Screening for prostate cancer 02/18/2023   Type 2 diabetes mellitus with hyperglycemia (HCC) 11/28/2022   Gait instability 11/28/2022   Retinal hemorrhage 11/21/2022   Glaucoma 11/21/2022   Achilles tendon contracture, right 11/21/2016   Hypokalemia    Class 1 obesity due to excess calories with body mass index (BMI) of 33.0 to 33.9 in adult    Wound infection 10/09/2016   Hyperglycemia 10/09/2016   Elevated blood pressure reading 10/09/2016   Diabetic polyneuropathy associated with type 2 diabetes mellitus (HCC)    Laceration w FB of right great toe w/o damage to nail, init    Past Medical History:  Diagnosis Date   Type II diabetes mellitus (HCC) dx'd 10/08/2016    Family History  Problem Relation Age of Onset   Diabetes Father    Lung cancer Other    Stroke Other    CAD Neg Hx    Hypertension Neg Hx     Past Surgical History:  Procedure Laterality Date   AMPUTATION Right 10/09/2016   Procedure: INCISION AND DRAINAGE RIGHT GREAT TOE;  Surgeon: Harden Jerona GAILS, MD;  Location: [facility] OR;  Service: Orthopedics;  Laterality: Right;   AMPUTATION Left 07/23/2023   Procedure: AMPUTATION, BELOW KNEE, LEFT;  Surgeon: Harden Jerona GAILS, MD;  Location: [facility] OR;  Service: Orthopedics;  Laterality: Left;  IRRIGATION AND DEBRIDEMENT AND AMPUTATION OF LEFT LEG   IRRIGATION AND DEBRIDEMENT KNEE Left 07/25/2023   Procedure: IRRIGATION AND DEBRIDEMENT LEFT KNEE;  Surgeon: Harden Jerona GAILS, MD;  Location: [facility] OR;  Service: Orthopedics;  Laterality: Left;  DEBRIDEMENT LEFT LEG   Social History   Occupational History   Not on file  Tobacco Use   Smoking status: Never   Smokeless tobacco: Never  Vaping Use   Vaping status: Never Used  Substance and Sexual Activity   Alcohol use: No   Drug use: No   Sexual activity: Not Currently

## 2023-12-22 ENCOUNTER — Encounter: Admitting: Physical Therapy

## 2023-12-29 ENCOUNTER — Encounter: Payer: Self-pay | Admitting: Physical Therapy

## 2023-12-29 ENCOUNTER — Ambulatory Visit: Admitting: Physical Therapy

## 2023-12-29 DIAGNOSIS — M21371 Foot drop, right foot: Secondary | ICD-10-CM | POA: Diagnosis not present

## 2023-12-29 DIAGNOSIS — R2689 Other abnormalities of gait and mobility: Secondary | ICD-10-CM

## 2023-12-29 DIAGNOSIS — M6281 Muscle weakness (generalized): Secondary | ICD-10-CM

## 2023-12-29 DIAGNOSIS — R2681 Unsteadiness on feet: Secondary | ICD-10-CM

## 2023-12-29 DIAGNOSIS — L98498 Non-pressure chronic ulcer of skin of other sites with other specified severity: Secondary | ICD-10-CM

## 2023-12-29 NOTE — Therapy (Signed)
 OUTPATIENT PHYSICAL THERAPY PROSTHETIC EVALUATION   Patient Name: Scott Navarro. MRN: [id number] DOB:[birth year], 58 y.o., male Today's Date: 12/29/2023  END OF SESSION:  PT End of Session - 12/29/23 1111     Visit Number 1    Number of Visits 25    Date for Recertification  03/25/24    Authorization Type [employer] other    Authorization - Visit Number 1    Authorization - Number of Visits 90    PT Start Time [phone]    PT Stop Time 0930    PT Time Calculation (min) 40 min    Equipment Utilized During Treatment Gait belt    Activity Tolerance Patient tolerated treatment well    Behavior During Therapy WFL for tasks assessed/performed          Past Medical History:  Diagnosis Date   Type II diabetes mellitus (HCC) dx'd 10/08/2016   Past Surgical History:  Procedure Laterality Date   AMPUTATION Right 10/09/2016   Procedure: INCISION AND DRAINAGE RIGHT GREAT TOE;  Surgeon: Harden Jerona GAILS, MD;  Location: [facility] OR;  Service: Orthopedics;  Laterality: Right;   AMPUTATION Left 07/23/2023   Procedure: AMPUTATION, BELOW KNEE, LEFT;  Surgeon: Harden Jerona GAILS, MD;  Location: [facility] OR;  Service: Orthopedics;  Laterality: Left;  IRRIGATION AND DEBRIDEMENT AND AMPUTATION OF LEFT LEG   IRRIGATION AND DEBRIDEMENT KNEE Left 07/25/2023   Procedure: IRRIGATION AND DEBRIDEMENT LEFT KNEE;  Surgeon: Harden Jerona GAILS, MD;  Location[facility];  Service: Orthopedics;  Laterality: Left;  DEBRIDEMENT LEFT LEG   Patient Active Problem List   Diagnosis Date Noted   Coping style affecting medical condition 08/05/2023   S/P BKA (below knee amputation), left (HCC) 07/30/2023   ARF (acute renal failure) 07/23/2023   Hyperlipidemia 07/23/2023   Amputation below knee (HCC) 07/23/2023   Necrotizing fasciitis (HCC) 07/22/2023   Anemia 07/22/2023   Hyponatremia 07/22/2023   Diabetic retinopathy associated with type 2 diabetes mellitus (HCC) 02/18/2023   Charcot joint of left foot 02/18/2023   Encounter for lipid screening  for cardiovascular disease 02/18/2023   Screening for prostate cancer 02/18/2023   Type 2 diabetes mellitus with hyperglycemia (HCC) 11/28/2022   Gait instability 11/28/2022   Retinal hemorrhage 11/21/2022   Glaucoma 11/21/2022   Achilles tendon contracture, right 11/21/2016   Hypokalemia    Class 1 obesity due to excess calories with body mass index (BMI) of 33.0 to 33.9 in adult    Wound infection 10/09/2016   Hyperglycemia 10/09/2016   Elevated blood pressure reading 10/09/2016   Diabetic polyneuropathy associated with type 2 diabetes mellitus (HCC)    Laceration w FB of right great toe w/o damage to nail, init     PCP: Bulah Alm RAMAN, PA-C  REFERRING PROVIDER: Maurilio CHRISTELLA Collet, PA-C  ONSET DATE:  12/26/2023 prosthesis delivery  REFERRING DIAG: S10.487 (ICD-10-CM) - S/P BKA (below knee amputation), left   THERAPY DIAG:  Other abnormalities of gait and mobility  Unsteadiness on feet  Muscle weakness (generalized)  Foot drop, right  Non-pressure chronic ulcer of skin of other sites with other specified severity [facility])  Rationale for Evaluation and Treatment: Rehabilitation  SUBJECTIVE:   SUBJECTIVE STATEMENT: This 58yo male underwent a left Transtibial Amputation on 07/23/2023 due to Necrotizing Fasciitis. He received his prosthesis on 12/26/2023 working with [employer]ford Rein, [facility] at [facility]. He has worn prosthesis 2 of 3 days up to 1.5 hrs 2x/day.   Pt accompanied by: family member  PERTINENT  HISTORY: left TTA 07/23/23, DM2, polyneuropathy, Acute renal failure, HLD  PAIN:  Are you having pain? No  PRECAUTIONS: Fall  RED FLAGS: None   WEIGHT BEARING RESTRICTIONS: No  FALLS: Has patient fallen in last 6 months? No  LIVING ENVIRONMENT: Lives with: lives with spouse and mother with Alzheimer's. He is currently living with son who had recent medical issue making him bed / wheelchair bound.  His wife & he are son's caregivers but think is not permanent situation.    Lives in: House his house is 2 story development worker, community / bedroom main level) and son's house is single level (where they are currently staying).  Home Access: 3 steps to main entrance to his home and son's house has ramp.  Stairs: Yes: Internal: 14 steps; on right going up and External: 3 steps; on left going up Has following equipment at home: Single point cane, Walker - 2 wheeled, Wheelchair (manual), Shower bench, and bed side commode  PLOF: Independent, Independent with household mobility without device, and Independent with community mobility without device  Occupation: retired  PATIENT GOALS: to use prosthesis normal activities: driving, shopping, yard work,  active at place in [employer],   OBJECTIVE:  Patient-Specific Activity Scoring Scheme  0 represents "unable to perform." 10 represents "able to perform at prior level. 0 1 2 3 4 5 6 7 8 9  10 (Date and Score)   Activity Eval 12/29/23    1. Care & wear of prosthesis  1    2. Standing ADLs   2    3. Walking  2   4. Outdoor activities 0   5.    Score 1.25    Total score = sum of the activity scores/number of activities Minimum detectable change (90%CI) for average score = 2 points Minimum detectable change (90%CI) for single activity score = 3 points  COGNITION: Evaluation on 12/29/2023: Overall cognitive status: Within functional limits for tasks assessed   SENSATION: Evaluation on 12/29/2023: [facility]  CARDIOVASCULAR RESPONSE: Evaluation on 12/29/2023: Functional activity: standing balance & gait Post-activity vitals: HR: 116 SpO2: 100% Modified Borg scale for dyspnea: 2: mild shortness of breath  POSTURE:  Evaluation on 12/29/2023: rounded shoulders, forward head, flexed trunk , and weight shift right  LOWER EXTREMITY ROM:  ROM P:passive  A:active Right Eval 12/29/23 Left Eval 12/29/23  Hip flexion    Hip extension    Hip abduction    Hip adduction    Hip internal rotation    Hip external  rotation    Knee flexion    Knee extension  0*  Ankle dorsiflexion    Ankle plantarflexion    Ankle inversion    Ankle eversion     (Blank rows = not tested)  LOWER EXTREMITY MMT:  MMT Right Eval 12/29/23 Left Eval 12/2723  Hip flexion    Hip extension 3/5   Hip abduction 3/5   Hip adduction    Hip internal rotation    Hip external rotation    Knee flexion 3/5   Knee extension 3/5 4/5  Ankle dorsiflexion  2-/5  Ankle plantarflexion    Ankle inversion    Ankle eversion    At Evaluation all strength testing is grossly seated and functionally standing / gait. (Blank rows = not tested)  TRANSFERS: Evaluation on 12/29/2023: Sit to stand: SBA from 22 w/c requires use of armrests and back of legs against chair &/or RW for stabilization. Stand to sit: SBA RW to 22 w/c requires  use of armrests and back of legs against chair &/or RW for stabilization.  FUNCTIONAL TESTs:  Evaluation on 12/29/2023: Lars Balance 12/56  [facility] PT Assessment - 12/29/23 0900       Standardized Balance Assessment   Standardized Balance Assessment Berg Balance Test      Berg Balance Test   Sit to Stand Needs minimal aid to stand or to stabilize    Standing Unsupported Needs several tries to stand 30 seconds unsupported    Sitting with Back Unsupported but Feet Supported on Floor or Stool Able to sit safely and securely 2 minutes    Stand to Sit Controls descent by using hands    Transfers Able to transfer safely, definite need of hands    Standing Unsupported with Eyes Closed Needs help to keep from falling    Standing Unsupported with Feet Together Needs help to attain position and unable to hold for 15 seconds    From Standing, Reach Forward with Outstretched Arm Loses balance while trying/requires external support    From Standing Position, Pick up Object from Floor Unable to try/needs assist to keep balance    From Standing Position, Turn to Look Behind Over each Shoulder Needs assist to keep  from losing balance and falling    Turn 360 Degrees Needs assistance while turning    Standing Unsupported, Alternately Place Feet on Step/Stool Needs assistance to keep from falling or unable to try    Standing Unsupported, One Foot in [employer] balance while stepping or standing    Standing on One Leg Unable to try or needs assist to prevent fall    Total Score 12    Berg comment: < 36 high risk for falls (close to 100%) 46-51 moderate (>50%)   37-45 significant (>80%) 52-55 lower (> 25%)          GAIT: Evaluation on 12/29/2023: Distance walked: 60' Assistive device utilized: [employer] Consultant - 2 wheeled and TTA prosthesis Level of assistance: Min A (multiple balance losses requiring PT assist to stabilize especially in turns) Gait pattern: step through pattern, decreased step length- Right, decreased stance time- Left, decreased hip/knee flexion- Left, decreased ankle dorsiflexion- Right, Right hip hike, Left hip hike, genu recurvatum- Right, antalgic, trendelenburg, lateral hip instability, trunk flexed, and poor foot clearance- Right, left knee instability / flexion moment intermittently  Gait velocity:  0.97 ft/sec Comments: excess UE weight bearing on RW.    CURRENT PROSTHETIC WEAR ASSESSMENT: Evaluation on 12/29/2023:  Patient is dependent with: skin check, residual limb care, care of non-amputated limb, prosthetic cleaning, ply sock cleaning, correct ply sock adjustment, proper wear schedule/adjustment, and proper weight-bearing schedule/adjustment Donning prosthesis: SBA Doffing prosthesis: SBA Prosthetic wear tolerance: up to 1.5 hours, 2x/day, 2 of 3 days since delivery Prosthetic weight bearing tolerance: 5 minutes with no c/o residual limb pain Edema: pitting Residual limb condition: cylindrical shape, 3 cm superficial scab at anterior-lateral limb with no signs of infection, small wound on lateral limb proximal to fibular head with white covering 0.4 cm, invaginated scar lateral  aspect, dry flaking skin, no hair growth, redness over patella (friction from liner when seated), normal temperature.  Prosthetic description: silicon (9 mm anterior portion) with pin lock suspension, total contact socket with flexible inner socket.    TODAY'S TREATMENT:  DATE:  12/29/2023: Prosthetic Care with left Transtibial  prosthesis: See patient education below.  PATIENT EDUCATION: PATIENT EDUCATED ON FOLLOWING PROSTHETIC CARE: Education details: position in sitting,   Skin check, Residual limb care, Prosthetic cleaning, Correct ply sock adjustment, Propper donning, and Proper wear schedule/adjustment Prosthetic wear tolerance: 2 hours 3x/day, 7 days/week Person educated: Patient Education method: Explanation, Demonstration, Tactile cues, and Verbal cues Education comprehension: verbalized understanding and needs further education  HOME EXERCISE PROGRAM:  ASSESSMENT:  CLINICAL IMPRESSION: Patient is a 58 y.o. male who was seen today for physical therapy evaluation and treatment for prosthetic training with left Transtibial Amputation.  Patient is dependent in prosthetic care with high risk for skin integrity issues and has limited wear of prosthesis.  Berg balance test indicates high risk of falls and dependency and standing ADLs.  Patient has dependent gait with prosthesis and rolling walker requiring assist to prevent falls.  Patient has right foot drop also present.  Patient would benefit from skilled physical therapy to improve safety and function with his prosthesis.  OBJECTIVE IMPAIRMENTS: Abnormal gait, decreased activity tolerance, decreased balance, decreased endurance, decreased knowledge of condition, decreased knowledge of use of DME, decreased mobility, difficulty walking, decreased strength, increased edema, postural dysfunction, prosthetic dependency  , and impaired skin integrity.   ACTIVITY LIMITATIONS: carrying, lifting, bending, sitting, standing, stairs, transfers, reach over head, and locomotion level  PARTICIPATION LIMITATIONS: meal prep, driving, shopping, community activity, and yard work  PERSONAL FACTORS: Fitness, Time since onset of injury/illness/exacerbation, and 3+ comorbidities: see PMH are also affecting patient's functional outcome.   REHAB POTENTIAL: Good  CLINICAL DECISION MAKING: Evolving/moderate complexity  EVALUATION COMPLEXITY: Moderate   GOALS: Goals reviewed with patient? Yes  SHORT TERM GOALS: Target date: 01/29/2024  Patient donnes prosthesis modified independent & verbalizes proper cleaning. Baseline: SEE OBJECTIVE DATA Goal status: INITIAL 2.  Patient tolerates prosthesis >10 hrs total /day without skin issues or limb pain >5/10 after standing. Baseline: SEE OBJECTIVE DATA Goal status: INITIAL  3.  Patient able to reach 7, pick up object from floor and look over both shoulders with RW support with supervision. Baseline: SEE OBJECTIVE DATA Goal status: INITIAL  4. Patient ambulates 48' with RW & prosthesis with supervision. Baseline: SEE OBJECTIVE DATA Goal status: INITIAL  5. Patient negotiates ramps & curbs with RW & prosthesis with minA. Baseline: SEE OBJECTIVE DATA Goal status: INITIAL  Bellamy TERM GOALS: Target date: 03/25/2024  Patient demonstrates & verbalized understanding of prosthetic care to enable safe utilization of prosthesis. Baseline: SEE OBJECTIVE DATA Goal status: INITIAL  Patient tolerates prosthesis wear >90% of awake hours without skin or limb pain issues. Baseline: SEE OBJECTIVE DATA Goal status: INITIAL  Berg Balance >30/56 to indicate lower fall risk Baseline: SEE OBJECTIVE DATA Goal status: INITIAL  Patient ambulates >300' with prosthesis & LRAD independently Baseline: SEE OBJECTIVE DATA Goal status: INITIAL  Patient negotiates ramps, curbs & stairs with  single rail with prosthesis & LRAD independently. Baseline: SEE OBJECTIVE DATA Goal status: INITIAL  Patient reports Patient-Specific Activity Score average to >/= 5 to indicate improvement in functional activities.   Baseline: SEE OBJECTIVE DATA Goal status: INITIAL  PLAN:  PT FREQUENCY: 2x/week  PT DURATION: 12 weeks  PLANNED INTERVENTIONS: 97164- PT Re-evaluation, 97750- Physical Performance Testing, 97110-Therapeutic exercises, 97530- Therapeutic activity, [id number]- Neuromuscular re-education, [phone]- Self Care, [id number]- Gait training, [phone]- Prosthetic Initial , [phone]- Orthotic/Prosthetic subsequent, Patient/Family education, Balance training, Stair training, Scar mobilization, Vestibular training, and DME instructions  PLAN FOR NEXT SESSION:  instruct in adjusting ply socks, review prosthetic care, check limb and progress wear if appropriate, HEP at sink   Grayce Spatz, PT, DPT 12/29/2023, 11:45 AM

## 2024-01-02 ENCOUNTER — Encounter: Payer: Self-pay | Admitting: Physician Assistant

## 2024-01-02 ENCOUNTER — Ambulatory Visit: Admitting: Physician Assistant

## 2024-01-02 DIAGNOSIS — Z89512 Acquired absence of left leg below knee: Secondary | ICD-10-CM

## 2024-01-02 NOTE — Progress Notes (Addendum)
 Office Visit Note   Patient: Scott Navarro.           Date of Birth: 14-Sep-1965           MRN: [id number] Visit Date: 01/02/2024              Requested by: Bulah Alm RAMAN, PA-C [address] [place],  [place] [address] PCP: Bulah Alm RAMAN, PA-C  Chief Complaint  Patient presents with   Left Leg - Follow-up    07/25/2023 left BKA debridement       HPI: Patient is a 58 year old gentleman who is seen in follow-up status post debridement and transtibial amputation the left for necrotizing fasciitis. Patient is currently packing the lateral wound with Vashe.  He is taking in extra protein with meals to assist with healing.  He is very pleased with his healing progress.               He was seen by [facility] and casted for his prosthesis.  He has been in PT for gait training.  The last visit was on 11/20/23 and the lateral incision wound was measured at 2 x 1.5 with 0.2 cm depth. Wet to dry packing with Vashe. Compression stump socks place over dressing.   Assessment & Plan: Visit Diagnoses: No diagnosis found.  Plan: Prosthetic gait training with PT.  Activity as tolerates.  AFO for right foot drop gait safety.  Follow-Up Instructions: Return if symptoms worsen or fail to improve.   Ortho Exam  Patient is alert, oriented, no adenopathy, well-dressed, normal affect, normal respiratory effort. Stump fully healed lateral Crease with normal skin coverage.  Stump appears viable and warm to touch.  He has a known foot drop on the right  side has neuropathy and we will request and AFO to assits with gait safety.         Imaging: No results found. No images are attached to the encounter.  Labs: Lab Results  Component Value Date   HGBA1C 6.7 (H) 07/23/2023   HGBA1C 6.7 (H) 06/10/2023   HGBA1C 8.3 (H) 02/24/2023   ESRSEDRATE 92 (H) 10/09/2016   CRP 2.7 (H) 10/09/2016   REPTSTATUS 07/28/2023 FINAL 07/23/2023   REPTSTATUS 07/28/2023 FINAL 07/23/2023   GRAMSTAIN NO WBC  SEEN NO ORGANISMS SEEN  07/23/2023   GRAMSTAIN  07/23/2023    ABUNDANT WBC PRESENT, PREDOMINANTLY PMN NO ORGANISMS SEEN    CULT  07/23/2023    No growth aerobically or anaerobically. Performed at [facility], [address], [place], [place] [address]    CULT  07/23/2023    No growth aerobically or anaerobically. Performed at [facility], [address] [place], [place] [address]    LABORGA PROTEUS MIRABILIS 10/09/2016   LABORGA ENTEROBACTER AEROGENES 10/09/2016     Lab Results  Component Value Date   ALBUMIN  2.1 (L) 07/31/2023   ALBUMIN  2.0 (L) 07/22/2023   ALBUMIN  4.1 06/10/2023   PREALBUMIN 17.8 (L) 10/09/2016    Lab Results  Component Value Date   MG 2.0 07/30/2023   MG 1.8 07/29/2023   MG 1.9 07/24/2023   No results found for: [facility]  Lab Results  Component Value Date   PREALBUMIN 17.8 (L) 10/09/2016      Latest Ref Rng & Units 08/11/2023    2:04 PM 08/11/2023    5:25 AM 08/10/2023    7:34 AM  CBC EXTENDED  WBC 4.0 - 10.5 K/uL  9.2    RBC  4.22 - 5.81 MIL/uL 2.90  2.73    Hemoglobin 13.0 - 17.0 g/dL  8.2  8.4   HCT 60.9 - 52.0 %  25.0  25.5   Platelets 150 - 400 K/uL  389       There is no height or weight on file to calculate BMI.  Orders:  No orders of the defined types were placed in this encounter.  No orders of the defined types were placed in this encounter.    Procedures: No procedures performed  Clinical Data: No additional findings.  ROS:  All other systems negative, except as noted in the HPI. Review of Systems  Objective: Vital Signs: There were no vitals taken for this visit.  Specialty Comments:  No specialty comments available.  PMFS History: Patient Active Problem List   Diagnosis Date Noted   Coping style affecting medical condition 08/05/2023   S/P BKA (below knee amputation), left (HCC) 07/30/2023   ARF (acute renal failure) 07/23/2023   Hyperlipidemia 07/23/2023   Amputation below knee (HCC)  07/23/2023   Necrotizing fasciitis (HCC) 07/22/2023   Anemia 07/22/2023   Hyponatremia 07/22/2023   Diabetic retinopathy associated with type 2 diabetes mellitus (HCC) 02/18/2023   Charcot joint of left foot 02/18/2023   Encounter for lipid screening for cardiovascular disease 02/18/2023   Screening for prostate cancer 02/18/2023   Type 2 diabetes mellitus with hyperglycemia (HCC) 11/28/2022   Gait instability 11/28/2022   Retinal hemorrhage 11/21/2022   Glaucoma 11/21/2022   Achilles tendon contracture, right 11/21/2016   Hypokalemia    Class 1 obesity due to excess calories with body mass index (BMI) of 33.0 to 33.9 in adult    Wound infection 10/09/2016   Hyperglycemia 10/09/2016   Elevated blood pressure reading 10/09/2016   Diabetic polyneuropathy associated with type 2 diabetes mellitus (HCC)    Laceration w FB of right great toe w/o damage to nail, init    Past Medical History:  Diagnosis Date   Type II diabetes mellitus (HCC) dx'd 10/08/2016    Family History  Problem Relation Age of Onset   Diabetes Father    Lung cancer Other    Stroke Other    CAD Neg Hx    Hypertension Neg Hx     Past Surgical History:  Procedure Laterality Date   AMPUTATION Right 10/09/2016   Procedure: INCISION AND DRAINAGE RIGHT GREAT TOE;  Surgeon: Harden Jerona GAILS, MD;  Location: [facility] OR;  Service: Orthopedics;  Laterality: Right;   AMPUTATION Left 07/23/2023   Procedure: AMPUTATION, BELOW KNEE, LEFT;  Surgeon: Harden Jerona GAILS, MD;  Location: [facility] OR;  Service: Orthopedics;  Laterality: Left;  IRRIGATION AND DEBRIDEMENT AND AMPUTATION OF LEFT LEG   IRRIGATION AND DEBRIDEMENT KNEE Left 07/25/2023   Procedure: IRRIGATION AND DEBRIDEMENT LEFT KNEE;  Surgeon: Harden Jerona GAILS, MD;  Location: [facility] OR;  Service: Orthopedics;  Laterality: Left;  DEBRIDEMENT LEFT LEG   Social History   Occupational History   Not on file  Tobacco Use   Smoking status: Never   Smokeless tobacco: Never  Vaping Use   Vaping  status: Never Used  Substance and Sexual Activity   Alcohol use: No   Drug use: No   Sexual activity: Not Currently

## 2024-01-05 ENCOUNTER — Ambulatory Visit (INDEPENDENT_AMBULATORY_CARE_PROVIDER_SITE_OTHER): Admitting: Physical Therapy

## 2024-01-05 ENCOUNTER — Encounter: Payer: Self-pay | Admitting: Physical Therapy

## 2024-01-05 ENCOUNTER — Encounter: Payer: Self-pay | Admitting: Radiology

## 2024-01-05 DIAGNOSIS — M6281 Muscle weakness (generalized): Secondary | ICD-10-CM | POA: Diagnosis not present

## 2024-01-05 DIAGNOSIS — R2689 Other abnormalities of gait and mobility: Secondary | ICD-10-CM

## 2024-01-05 DIAGNOSIS — L98498 Non-pressure chronic ulcer of skin of other sites with other specified severity: Secondary | ICD-10-CM

## 2024-01-05 DIAGNOSIS — R2681 Unsteadiness on feet: Secondary | ICD-10-CM

## 2024-01-05 DIAGNOSIS — M21371 Foot drop, right foot: Secondary | ICD-10-CM | POA: Diagnosis not present

## 2024-01-05 NOTE — Therapy (Signed)
 OUTPATIENT PHYSICAL THERAPY PROSTHETIC TREATMENT   Patient Name: Scott Navarro. MRN: [id number] DOB:November 30, 1965, 58 y.o., male Today's Date: 01/05/2024  END OF SESSION:  PT End of Session - 01/05/24 1157     Visit Number 2    Number of Visits 25    Date for Recertification  03/25/24    Authorization Type [employer] other    Authorization - Visit Number 2    Authorization - Number of Visits 90    PT Start Time 1156    PT Stop Time 1230    PT Time Calculation (min) 34 min    Equipment Utilized During Treatment Gait belt    Activity Tolerance Patient tolerated treatment well    Behavior During Therapy WFL for tasks assessed/performed           Past Medical History:  Diagnosis Date   Type II diabetes mellitus (HCC) dx'd 10/08/2016   Past Surgical History:  Procedure Laterality Date   AMPUTATION Right 10/09/2016   Procedure: INCISION AND DRAINAGE RIGHT GREAT TOE;  Surgeon: Harden Jerona GAILS, MD;  Location: [facility] OR;  Service: Orthopedics;  Laterality: Right;   AMPUTATION Left 07/23/2023   Procedure: AMPUTATION, BELOW KNEE, LEFT;  Surgeon: Harden Jerona GAILS, MD;  Location: [facility] OR;  Service: Orthopedics;  Laterality: Left;  IRRIGATION AND DEBRIDEMENT AND AMPUTATION OF LEFT LEG   IRRIGATION AND DEBRIDEMENT KNEE Left 07/25/2023   Procedure: IRRIGATION AND DEBRIDEMENT LEFT KNEE;  Surgeon: Harden Jerona GAILS, MD;  Location: [facility] OR;  Service: Orthopedics;  Laterality: Left;  DEBRIDEMENT LEFT LEG   Patient Active Problem List   Diagnosis Date Noted   Coping style affecting medical condition 08/05/2023   S/P BKA (below knee amputation), left (HCC) 07/30/2023   ARF (acute renal failure) 07/23/2023   Hyperlipidemia 07/23/2023   Amputation below knee (HCC) 07/23/2023   Necrotizing fasciitis (HCC) 07/22/2023   Anemia 07/22/2023   Hyponatremia 07/22/2023   Diabetic retinopathy associated with type 2 diabetes mellitus (HCC) 02/18/2023   Charcot joint of left foot 02/18/2023   Encounter for lipid screening  for cardiovascular disease 02/18/2023   Screening for prostate cancer 02/18/2023   Type 2 diabetes mellitus with hyperglycemia (HCC) 11/28/2022   Gait instability 11/28/2022   Retinal hemorrhage 11/21/2022   Glaucoma 11/21/2022   Achilles tendon contracture, right 11/21/2016   Hypokalemia    Class 1 obesity due to excess calories with body mass index (BMI) of 33.0 to 33.9 in adult    Wound infection 10/09/2016   Hyperglycemia 10/09/2016   Elevated blood pressure reading 10/09/2016   Diabetic polyneuropathy associated with type 2 diabetes mellitus (HCC)    Laceration w FB of right great toe w/o damage to nail, init     PCP: Bulah Alm RAMAN, PA-C  REFERRING PROVIDER: Maurilio CHRISTELLA Collet, PA-C  ONSET DATE:  12/26/2023 prosthesis delivery  REFERRING DIAG: S10.487 (ICD-10-CM) - S/P BKA (below knee amputation), left   THERAPY DIAG:  Other abnormalities of gait and mobility  Unsteadiness on feet  Muscle weakness (generalized)  Foot drop, right  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    Pt accompanied by: family member  PERTINENT HISTORY: left TTA 07/23/23, DM2, polyneuropathy, Acute renal failure, HLD  PAIN:  Are you having pain? No  PRECAUTIONS: Fall  RED FLAGS: None  WEIGHT BEARING RESTRICTIONS: No  FALLS: Has patient fallen in last 6 months? No  LIVING ENVIRONMENT: Lives with: lives with spouse and mother with Alzheimer's. He is currently living with son who had recent medical issue making him bed / wheelchair bound.  His wife & he are son's caregivers but think is not permanent situation.   Lives in: House his house is 2 story development worker, community / bedroom main level) and son's house is single level (where they are currently staying).  Home Access: 3 steps  to main entrance to his home and son's house has ramp.  Stairs: Yes: Internal: 14 steps; on right going up and External: 3 steps; on left going up Has following equipment at home: Single point cane, Walker - 2 wheeled, Wheelchair (manual), Shower bench, and bed side commode  PLOF: Independent, Independent with household mobility without device, and Independent with community mobility without device  Occupation: retired  PATIENT GOALS: to use prosthesis normal activities: driving, shopping, yard work,  active at place in [employer] [employer],   OBJECTIVE:  Patient-Specific Activity Scoring Scheme  0 represents "unable to perform." 10 represents "able to perform at prior level. 0 1 2 3 4 5 6 7 8 9  10 (Date and Score)   Activity Eval 12/29/23    1. Care & wear of prosthesis  1    2. Standing ADLs   2    3. Walking  2   4. Outdoor activities 0   5.    Score 1.25    Total score = sum of the activity scores/number of activities Minimum detectable change (90%CI) for average score = 2 points Minimum detectable change (90%CI) for single activity score = 3 points  COGNITION: Evaluation on 12/29/2023: Overall cognitive status: Within functional limits for tasks assessed   SENSATION: Evaluation on 12/29/2023: [facility]  CARDIOVASCULAR RESPONSE: Evaluation on 12/29/2023: Functional activity: standing balance & gait Post-activity vitals: HR: 116 SpO2: 100% Modified Borg scale for dyspnea: 2: mild shortness of breath  POSTURE:  Evaluation on 12/29/2023: rounded shoulders, forward head, flexed trunk , and weight shift right  LOWER EXTREMITY ROM:  ROM P:passive  A:active Right Eval 12/29/23 Left Eval 12/29/23  Hip flexion    Hip extension    Hip abduction    Hip adduction    Hip internal rotation    Hip external rotation    Knee flexion    Knee extension  0*  Ankle dorsiflexion    Ankle plantarflexion    Ankle inversion    Ankle eversion     (Blank rows = not  tested)  LOWER EXTREMITY MMT:  MMT Right Eval 12/29/23 Left Eval 12/2723  Hip flexion    Hip extension 3/5   Hip abduction 3/5   Hip adduction    Hip internal rotation    Hip external rotation    Knee flexion 3/5   Knee extension 3/5 4/5  Ankle dorsiflexion  2-/5  Ankle plantarflexion    Ankle inversion    Ankle eversion    At Evaluation all strength testing is grossly seated and functionally standing / gait. (Blank rows = not tested)  TRANSFERS: Evaluation on 12/29/2023: Sit to stand: SBA from 22 w/c requires use of armrests and back of legs against chair &/or RW for stabilization. Stand to sit: SBA RW to 22 w/c requires use of armrests and back of legs against chair &/or RW for stabilization.  FUNCTIONAL TESTs:  Evaluation on 12/29/2023: Lars Balance 12/56  [facility] PT Assessment -  12/29/23 0900       Standardized Balance Assessment   Standardized Balance Assessment Berg Balance Test      Berg Balance Test   Sit to Stand Needs minimal aid to stand or to stabilize    Standing Unsupported Needs several tries to stand 30 seconds unsupported    Sitting with Back Unsupported but Feet Supported on Floor or Stool Able to sit safely and securely 2 minutes    Stand to Sit Controls descent by using hands    Transfers Able to transfer safely, definite need of hands    Standing Unsupported with Eyes Closed Needs help to keep from falling    Standing Unsupported with Feet Together Needs help to attain position and unable to hold for 15 seconds    From Standing, Reach Forward with Outstretched Arm Loses balance while trying/requires external support    From Standing Position, Pick up Object from Floor Unable to try/needs assist to keep balance    From Standing Position, Turn to Look Behind Over each Shoulder Needs assist to keep from losing balance and falling    Turn 360 Degrees Needs assistance while turning    Standing Unsupported, Alternately Place Feet on Step/Stool Needs  assistance to keep from falling or unable to try    Standing Unsupported, One Foot in [employer] balance while stepping or standing    Standing on One Leg Unable to try or needs assist to prevent fall    Total Score 12    Berg comment: < 36 high risk for falls (close to 100%) 46-51 moderate (>50%)   37-45 significant (>80%) 52-55 lower (> 25%)          GAIT: Evaluation on 12/29/2023: Distance walked: 60' Assistive device utilized: [employer]ant - 2 wheeled and TTA prosthesis Level of assistance: Min A (multiple balance losses requiring PT assist to stabilize especially in turns) Gait pattern: step through pattern, decreased step length- Right, decreased stance time- Left, decreased hip/knee flexion- Left, decreased ankle dorsiflexion- Right, Right hip hike, Left hip hike, genu recurvatum- Right, antalgic, trendelenburg, lateral hip instability, trunk flexed, and poor foot clearance- Right, left knee instability / flexion moment intermittently  Gait velocity:  0.97 ft/sec Comments: excess UE weight bearing on RW.    CURRENT PROSTHETIC WEAR ASSESSMENT: Evaluation on 12/29/2023:  Patient is dependent with: skin check, residual limb care, care of non-amputated limb, prosthetic cleaning, ply sock cleaning, correct ply sock adjustment, proper wear schedule/adjustment, and proper weight-bearing schedule/adjustment Donning prosthesis: SBA Doffing prosthesis: SBA Prosthetic wear tolerance: up to 1.5 hours, 2x/day, 2 of 3 days since delivery Prosthetic weight bearing tolerance: 5 minutes with no c/o residual limb pain Edema: pitting Residual limb condition: cylindrical shape, 3 cm superficial scab at anterior-lateral limb with no signs of infection, small wound on lateral limb proximal to fibular head with white covering 0.4 cm, invaginated scar lateral aspect, dry flaking skin, no hair growth, redness over patella (friction from liner when seated), normal temperature.  Prosthetic description: silicon (9  mm anterior portion) with pin lock suspension, total contact socket with flexible inner socket.    TODAY'S TREATMENT:  DATE:  01/05/2024: Prosthetic Care with left Transtibial  prosthesis: Patient has no open areas on residual limb now.  PT recommended washing & drying invaginated area thoroughly.  PT recommended increasing wear to 3 hrs 3x/day with 2 hours off bw wears.  Pt verbalized understanding.  PT instructed in adjusting ply socks by donning, ambulating & checking patella depth into socket with too few, too many and correct ply fit.  Pt amb 25' X 3 with RW with min/CGA.   PT used band on RW as visual cue for proper step width.  Pt amb 120' with RW with CGA with carryover.   PT instructed how to fix brake on his w/c and pt verbalized understanding.     TREATMENT:                                                                                                                             DATE:  12/29/2023: Prosthetic Care with left Transtibial  prosthesis: See patient education below.  PATIENT EDUCATION: PATIENT EDUCATED ON FOLLOWING PROSTHETIC CARE: Education details: position in sitting,   Skin check, Residual limb care, Prosthetic cleaning, Correct ply sock adjustment, Propper donning, and Proper wear schedule/adjustment Prosthetic wear tolerance: 2 hours 3x/day, 7 days/week Person educated: Patient Education method: Explanation, Demonstration, Tactile cues, and Verbal cues Education comprehension: verbalized understanding and needs further education  HOME EXERCISE PROGRAM:  ASSESSMENT:  CLINICAL IMPRESSION: Patient appears to have better understanding of adjusting ply socks.  His balance for gait with RW was improved with proper step width and correct ply fit.    Patient continues to benefit from skilled physical therapy to improve safety and function with his  prosthesis.  OBJECTIVE IMPAIRMENTS: Abnormal gait, decreased activity tolerance, decreased balance, decreased endurance, decreased knowledge of condition, decreased knowledge of use of DME, decreased mobility, difficulty walking, decreased strength, increased edema, postural dysfunction, prosthetic dependency , and impaired skin integrity.   ACTIVITY LIMITATIONS: carrying, lifting, bending, sitting, standing, stairs, transfers, reach over head, and locomotion level  PARTICIPATION LIMITATIONS: meal prep, driving, shopping, community activity, and yard work  PERSONAL FACTORS: Fitness, Time since onset of injury/illness/exacerbation, and 3+ comorbidities: see PMH are also affecting patient's functional outcome.   REHAB POTENTIAL: Good  CLINICAL DECISION MAKING: Evolving/moderate complexity  EVALUATION COMPLEXITY: Moderate   GOALS: Goals reviewed with patient? Yes  SHORT TERM GOALS: Target date: 01/29/2024  Patient donnes prosthesis modified independent & verbalizes proper cleaning. Baseline: SEE OBJECTIVE DATA Goal status: Ongoing   01/05/2024 2.  Patient tolerates prosthesis >10 hrs total /day without skin issues or limb pain >5/10 after standing. Baseline: SEE OBJECTIVE DATA Goal status: Ongoing   01/05/2024  3.  Patient able to reach 7, pick up object from floor and look over both shoulders with RW support with supervision. Baseline: SEE OBJECTIVE DATA Goal status: Ongoing   01/05/2024  4. Patient ambulates 52' with RW & prosthesis with supervision. Baseline: SEE OBJECTIVE DATA Goal status: Ongoing  01/05/2024  5. Patient negotiates ramps & curbs with RW & prosthesis with minA. Baseline: SEE OBJECTIVE DATA Goal status: Ongoing   01/05/2024  [place] TERM GOALS: Target date: 03/25/2024  Patient demonstrates & verbalized understanding of prosthetic care to enable safe utilization of prosthesis. Baseline: SEE OBJECTIVE DATA Goal status: Ongoing   01/05/2024  Patient tolerates  prosthesis wear >90% of awake hours without skin or limb pain issues. Baseline: SEE OBJECTIVE DATA Goal status: Ongoing   01/05/2024  Berg Balance >30/56 to indicate lower fall risk Baseline: SEE OBJECTIVE DATA Goal status: Ongoing   01/05/2024  Patient ambulates >300' with prosthesis & LRAD independently Baseline: SEE OBJECTIVE DATA Goal status: Ongoing   01/05/2024  Patient negotiates ramps, curbs & stairs with single rail with prosthesis & LRAD independently. Baseline: SEE OBJECTIVE DATA Goal status: Ongoing   01/05/2024  Patient reports Patient-Specific Activity Score average to >/= 5 to indicate improvement in functional activities.   Baseline: SEE OBJECTIVE DATA Goal status: Ongoing   01/05/2024  PLAN:  PT FREQUENCY: 2x/week  PT DURATION: 12 weeks  PLANNED INTERVENTIONS: 97164- PT Re-evaluation, 97750- Physical Performance Testing, 97110-Therapeutic exercises, 97530- Therapeutic activity, [id number]- Neuromuscular re-education, [phone]- Self Care, [id number]- Gait training, [phone]- Prosthetic Initial , [phone]- Orthotic/Prosthetic subsequent, Patient/Family education, Balance training, Stair training, Scar mobilization, Vestibular training, and DME instructions  PLAN FOR NEXT SESSION:      trial AFO on RLE for gait with RW.  HEP at sink   Grayce Spatz, PT, DPT 01/05/2024, 12:46 PM

## 2024-01-07 ENCOUNTER — Encounter: Payer: Self-pay | Admitting: Physical Therapy

## 2024-01-07 ENCOUNTER — Telehealth: Payer: Self-pay | Admitting: Physical Therapy

## 2024-01-07 ENCOUNTER — Ambulatory Visit: Admitting: Physical Therapy

## 2024-01-07 DIAGNOSIS — R2689 Other abnormalities of gait and mobility: Secondary | ICD-10-CM

## 2024-01-07 DIAGNOSIS — M21371 Foot drop, right foot: Secondary | ICD-10-CM

## 2024-01-07 DIAGNOSIS — R2681 Unsteadiness on feet: Secondary | ICD-10-CM

## 2024-01-07 DIAGNOSIS — M6281 Muscle weakness (generalized): Secondary | ICD-10-CM | POA: Diagnosis not present

## 2024-01-07 DIAGNOSIS — L98498 Non-pressure chronic ulcer of skin of other sites with other specified severity: Secondary | ICD-10-CM

## 2024-01-07 NOTE — Therapy (Signed)
 OUTPATIENT PHYSICAL THERAPY PROSTHETIC TREATMENT   Patient Name: Scott Navarro. MRN: [id number] DOB:[birth year], 58 y.o., male Today's Date: 01/07/2024  END OF SESSION:  PT End of Session - 01/07/24 1145     Visit Number 3    Number of Visits 25    Date for Recertification  03/25/24    Authorization Type [employer] other    Authorization - Visit Number 3    Authorization - Number of Visits 90    PT Start Time 1145    PT Stop Time 1223    PT Time Calculation (min) 38 min    Equipment Utilized During Treatment Gait belt    Activity Tolerance Patient tolerated treatment well    Behavior During Therapy WFL for tasks assessed/performed            Past Medical History:  Diagnosis Date   Type II diabetes mellitus (HCC) dx'd 10/08/2016   Past Surgical History:  Procedure Laterality Date   AMPUTATION Right 10/09/2016   Procedure: INCISION AND DRAINAGE RIGHT GREAT TOE;  Surgeon: Harden Jerona GAILS, MD;  Location: [facility] OR;  Service: Orthopedics;  Laterality: Right;   AMPUTATION Left 07/23/2023   Procedure: AMPUTATION, BELOW KNEE, LEFT;  Surgeon: Harden Jerona GAILS, MD;  Location: [facility] OR;  Service: Orthopedics;  Laterality: Left;  IRRIGATION AND DEBRIDEMENT AND AMPUTATION OF LEFT LEG   IRRIGATION AND DEBRIDEMENT KNEE Left 07/25/2023   Procedure: IRRIGATION AND DEBRIDEMENT LEFT KNEE;  Surgeon: Harden Jerona GAILS, MD;  Location: [facility] OR;  Service: Orthopedics;  Laterality: Left;  DEBRIDEMENT LEFT LEG   Patient Active Problem List   Diagnosis Date Noted   Coping style affecting medical condition 08/05/2023   S/P BKA (below knee amputation), left (HCC) 07/30/2023   ARF (acute renal failure) 07/23/2023   Hyperlipidemia 07/23/2023   Amputation below knee (HCC) 07/23/2023   Necrotizing fasciitis (HCC) 07/22/2023   Anemia 07/22/2023   Hyponatremia 07/22/2023   Diabetic retinopathy associated with type 2 diabetes mellitus (HCC) 02/18/2023   Charcot joint of left foot 02/18/2023   Encounter for lipid  screening for cardiovascular disease 02/18/2023   Screening for prostate cancer 02/18/2023   Type 2 diabetes mellitus with hyperglycemia (HCC) 11/28/2022   Gait instability 11/28/2022   Retinal hemorrhage 11/21/2022   Glaucoma 11/21/2022   Achilles tendon contracture, right 11/21/2016   Hypokalemia    Class 1 obesity due to excess calories with body mass index (BMI) of 33.0 to 33.9 in adult    Wound infection 10/09/2016   Hyperglycemia 10/09/2016   Elevated blood pressure reading 10/09/2016   Diabetic polyneuropathy associated with type 2 diabetes mellitus (HCC)    Laceration w FB of right great toe w/o damage to nail, init     PCP: Bulah Alm RAMAN, PA-C  REFERRING PROVIDER: Maurilio CHRISTELLA Collet, PA-C  ONSET DATE:  12/26/2023 prosthesis delivery  REFERRING DIAG: S10.487 (ICD-10-CM) - S/P BKA (below knee amputation), left   THERAPY DIAG:  Other abnormalities of gait and mobility  Unsteadiness on feet  Muscle weakness (generalized)  Foot drop, right  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   Pt accompanied by: family member  PERTINENT HISTORY: left TTA 07/23/23, DM2, polyneuropathy, Acute renal failure, HLD  PAIN:  Are you having pain? No  PRECAUTIONS: Fall  RED FLAGS: None  WEIGHT BEARING RESTRICTIONS: No  FALLS: Has patient fallen in last 6 months? No  LIVING ENVIRONMENT: Lives with: lives with spouse and mother with Alzheimer's. He is currently living with son who had recent medical issue making him bed / wheelchair bound.  His wife & he are son's caregivers but think is not permanent situation.   Lives in: House his house is 2 story development worker, community / bedroom main level) and son's house is single level (where they are currently staying).  Home Access: 3  steps to main entrance to his home and son's house has ramp.  Stairs: Yes: Internal: 14 steps; on right going up and External: 3 steps; on left going up Has following equipment at home: Single point cane, Walker - 2 wheeled, Wheelchair (manual), Shower bench, and bed side commode  PLOF: Independent, Independent with household mobility without device, and Independent with community mobility without device  Occupation: retired  PATIENT GOALS: to use prosthesis normal activities: driving, shopping, yard work,  active at place in [employer],   OBJECTIVE:  Patient-Specific Activity Scoring Scheme  0 represents "unable to perform." 10 represents "able to perform at prior level. 0 1 2 3 4 5 6 7 8 9  10 (Date and Score)   Activity Eval 12/29/23    1. Care & wear of prosthesis  1    2. Standing ADLs   2    3. Walking  2   4. Outdoor activities 0   5.    Score 1.25    Total score = sum of the activity scores/number of activities Minimum detectable change (90%CI) for average score = 2 points Minimum detectable change (90%CI) for single activity score = 3 points  COGNITION: Evaluation on 12/29/2023: Overall cognitive status: Within functional limits for tasks assessed   SENSATION: Evaluation on 12/29/2023: [facility]  CARDIOVASCULAR RESPONSE: Evaluation on 12/29/2023: Functional activity: standing balance & gait Post-activity vitals: HR: 116 SpO2: 100% Modified Borg scale for dyspnea: 2: mild shortness of breath  POSTURE:  Evaluation on 12/29/2023: rounded shoulders, forward head, flexed trunk , and weight shift right  LOWER EXTREMITY ROM:  ROM P:passive  A:active Right Eval 12/29/23 Left Eval 12/29/23  Hip flexion    Hip extension    Hip abduction    Hip adduction    Hip internal rotation    Hip external rotation    Knee flexion    Knee extension  0*  Ankle dorsiflexion    Ankle plantarflexion    Ankle inversion    Ankle eversion     (Blank rows = not  tested)  LOWER EXTREMITY MMT:  MMT Right Eval 12/29/23 Left Eval 12/2723  Hip flexion    Hip extension 3/5   Hip abduction 3/5   Hip adduction    Hip internal rotation    Hip external rotation    Knee flexion 3/5   Knee extension 3/5 4/5  Ankle dorsiflexion  2-/5  Ankle plantarflexion    Ankle inversion    Ankle eversion    At Evaluation all strength testing is grossly seated and functionally standing / gait. (Blank rows = not tested)  TRANSFERS: Evaluation on 12/29/2023: Sit to stand: SBA from 22 w/c requires use of armrests and back of legs against chair &/or RW for stabilization. Stand to sit: SBA RW to 22 w/c requires use of armrests and back of legs against chair &/or RW for stabilization.  FUNCTIONAL TESTs:  Evaluation on 12/29/2023: Lars Balance 12/56  [facility] PT Assessment -  12/29/23 0900       Standardized Balance Assessment   Standardized Balance Assessment Berg Balance Test      Berg Balance Test   Sit to Stand Needs minimal aid to stand or to stabilize    Standing Unsupported Needs several tries to stand 30 seconds unsupported    Sitting with Back Unsupported but Feet Supported on Floor or Stool Able to sit safely and securely 2 minutes    Stand to Sit Controls descent by using hands    Transfers Able to transfer safely, definite need of hands    Standing Unsupported with Eyes Closed Needs help to keep from falling    Standing Unsupported with Feet Together Needs help to attain position and unable to hold for 15 seconds    From Standing, Reach Forward with Outstretched Arm Loses balance while trying/requires external support    From Standing Position, Pick up Object from Floor Unable to try/needs assist to keep balance    From Standing Position, Turn to Look Behind Over each Shoulder Needs assist to keep from losing balance and falling    Turn 360 Degrees Needs assistance while turning    Standing Unsupported, Alternately Place Feet on Step/Stool Needs  assistance to keep from falling or unable to try    Standing Unsupported, One Foot in [employer] balance while stepping or standing    Standing on One Leg Unable to try or needs assist to prevent fall    Total Score 12    Berg comment: < 36 high risk for falls (close to 100%) 46-51 moderate (>50%)   37-45 significant (>80%) 52-55 lower (> 25%)          GAIT: Evaluation on 12/29/2023: Distance walked: 60' Assistive device utilized: [employer] - 2 wheeled and TTA prosthesis Level of assistance: Min A (multiple balance losses requiring PT assist to stabilize especially in turns) Gait pattern: step through pattern, decreased step length- Right, decreased stance time- Left, decreased hip/knee flexion- Left, decreased ankle dorsiflexion- Right, Right hip hike, Left hip hike, genu recurvatum- Right, antalgic, trendelenburg, lateral hip instability, trunk flexed, and poor foot clearance- Right, left knee instability / flexion moment intermittently  Gait velocity:  0.97 ft/sec Comments: excess UE weight bearing on RW.    CURRENT PROSTHETIC WEAR ASSESSMENT: Evaluation on 12/29/2023:  Patient is dependent with: skin check, residual limb care, care of non-amputated limb, prosthetic cleaning, ply sock cleaning, correct ply sock adjustment, proper wear schedule/adjustment, and proper weight-bearing schedule/adjustment Donning prosthesis: SBA Doffing prosthesis: SBA Prosthetic wear tolerance: up to 1.5 hours, 2x/day, 2 of 3 days since delivery Prosthetic weight bearing tolerance: 5 minutes with no c/o residual limb pain Edema: pitting Residual limb condition: cylindrical shape, 3 cm superficial scab at anterior-lateral limb with no signs of infection, small wound on lateral limb proximal to fibular head with white covering 0.4 cm, invaginated scar lateral aspect, dry flaking skin, no hair growth, redness over patella (friction from liner when seated), normal temperature.  Prosthetic description: silicon (9  mm anterior portion) with pin lock suspension, total contact socket with flexible inner socket.    TODAY'S TREATMENT:  DATE:  01/07/2024: Prosthetic Care with left Transtibial  prosthesis and right blue rocker AFO: PT verbally and demo educated patient on donning and use of right blue rocker AFO.  Patient verbalized understanding. Patient ambulated 40' x 2 with RW with minA.  He had significantly improved foot clearance of right foot with use of the AFO.  Patient also had less balance issues requiring increased assistance to prevent falls. PT demo and verbal cues on negotiating curb with RW, TTA prosthesis and AFO.  Patient negotiated 6.5 curb with modA.   PT demo and verbal cues on proper sit to/from stand technique.  Patient able to arise from 18 inch chair without armrests using BUEs on seat with minA for balance. Patient ambulated 200' with RW with minA with visual and verbal cues for for proper step width, upright posture and weight shift over stance lower extremity.    TREATMENT:                                                                                                                             DATE:  01/05/2024: Prosthetic Care with left Transtibial  prosthesis: Patient has no open areas on residual limb now.  PT recommended washing & drying invaginated area thoroughly.  PT recommended increasing wear to 3 hrs 3x/day with 2 hours off bw wears.  Pt verbalized understanding.  PT instructed in adjusting ply socks by donning, ambulating & checking patella depth into socket with too few, too many and correct ply fit.  Pt amb 25' X 3 with RW with min/CGA.   PT used band on RW as visual cue for proper step width.  Pt amb 120' with RW with CGA with carryover.   PT instructed how to fix brake on his w/c and pt verbalized understanding.     TREATMENT:                                                                                                                              DATE:  12/29/2023: Prosthetic Care with left Transtibial  prosthesis: See patient education below.    HOME EXERCISE PROGRAM:  ASSESSMENT:  CLINICAL IMPRESSION: Use of a right AFO due to foot drop significantly improved patient's gait and balance.  Patient would benefit from receiving an AFO so PT requested prescription from Dr. Harden to be sent to [facility] clinic.  Patient continues to benefit from skilled physical therapy to  improve safety and function with his prosthesis.  OBJECTIVE IMPAIRMENTS: Abnormal gait, decreased activity tolerance, decreased balance, decreased endurance, decreased knowledge of condition, decreased knowledge of use of DME, decreased mobility, difficulty walking, decreased strength, increased edema, postural dysfunction, prosthetic dependency , and impaired skin integrity.   ACTIVITY LIMITATIONS: carrying, lifting, bending, sitting, standing, stairs, transfers, reach over head, and locomotion level  PARTICIPATION LIMITATIONS: meal prep, driving, shopping, community activity, and yard work  PERSONAL FACTORS: Fitness, Time since onset of injury/illness/exacerbation, and 3+ comorbidities: see PMH are also affecting patient's functional outcome.   REHAB POTENTIAL: Good  CLINICAL DECISION MAKING: Evolving/moderate complexity  EVALUATION COMPLEXITY: Moderate   GOALS: Goals reviewed with patient? Yes  SHORT TERM GOALS: Target date: 01/29/2024  Patient donnes prosthesis modified independent & verbalizes proper cleaning. Baseline: SEE OBJECTIVE DATA Goal status: Ongoing   01/05/2024 2.  Patient tolerates prosthesis >10 hrs total /day without skin issues or limb pain >5/10 after standing. Baseline: SEE OBJECTIVE DATA Goal status: Ongoing   01/05/2024  3.  Patient able to reach 7, pick up object from floor and look over both shoulders with RW support with  supervision. Baseline: SEE OBJECTIVE DATA Goal status: Ongoing   01/05/2024  4. Patient ambulates 62' with RW & prosthesis with supervision. Baseline: SEE OBJECTIVE DATA Goal status: Ongoing   01/05/2024  5. Patient negotiates ramps & curbs with RW & prosthesis with minA. Baseline: SEE OBJECTIVE DATA Goal status: Ongoing   01/05/2024  Dorner TERM GOALS: Target date: 03/25/2024  Patient demonstrates & verbalized understanding of prosthetic care to enable safe utilization of prosthesis. Baseline: SEE OBJECTIVE DATA Goal status: Ongoing   01/05/2024  Patient tolerates prosthesis wear >90% of awake hours without skin or limb pain issues. Baseline: SEE OBJECTIVE DATA Goal status: Ongoing   01/05/2024  Berg Balance >30/56 to indicate lower fall risk Baseline: SEE OBJECTIVE DATA Goal status: Ongoing   01/05/2024  Patient ambulates >300' with prosthesis & LRAD independently Baseline: SEE OBJECTIVE DATA Goal status: Ongoing   01/05/2024  Patient negotiates ramps, curbs & stairs with single rail with prosthesis & LRAD independently. Baseline: SEE OBJECTIVE DATA Goal status: Ongoing   01/05/2024  Patient reports Patient-Specific Activity Score average to >/= 5 to indicate improvement in functional activities.   Baseline: SEE OBJECTIVE DATA Goal status: Ongoing   01/05/2024  PLAN:  PT FREQUENCY: 2x/week  PT DURATION: 12 weeks  PLANNED INTERVENTIONS: [id number]- PT Re-evaluation, 97750- Physical Performance Testing, 97110-Therapeutic exercises, 97530- Therapeutic activity, [id number]- Neuromuscular re-education, [phone]- Self Care, [id number]- Gait training, [phone]- Prosthetic Initial , [phone]- Orthotic/Prosthetic subsequent, Patient/Family education, Balance training, Stair training, Scar mobilization, Vestibular training, and DME instructions  PLAN FOR NEXT SESSION:   Continue prosthetic gait with RW, prosthesis and AFO.  HEP at sink   Grayce Spatz, PT, DPT 01/07/2024, 4:30 PM

## 2024-01-07 NOTE — Telephone Encounter (Signed)
 Scott Navarro needs a right AFO carbon dynamic response due to right foot drop.  We have trailed one in PT and it significantly improves his gait & balance.  Can you please write a prescription and send it to [facility], [facility]  Thank you Grayce

## 2024-01-12 ENCOUNTER — Ambulatory Visit: Admitting: Physical Therapy

## 2024-01-12 ENCOUNTER — Encounter: Payer: Self-pay | Admitting: Physical Therapy

## 2024-01-12 DIAGNOSIS — L98498 Non-pressure chronic ulcer of skin of other sites with other specified severity: Secondary | ICD-10-CM

## 2024-01-12 DIAGNOSIS — R2681 Unsteadiness on feet: Secondary | ICD-10-CM | POA: Diagnosis not present

## 2024-01-12 DIAGNOSIS — M6281 Muscle weakness (generalized): Secondary | ICD-10-CM

## 2024-01-12 DIAGNOSIS — M21371 Foot drop, right foot: Secondary | ICD-10-CM

## 2024-01-12 DIAGNOSIS — R2689 Other abnormalities of gait and mobility: Secondary | ICD-10-CM | POA: Diagnosis not present

## 2024-01-12 NOTE — Therapy (Signed)
 OUTPATIENT PHYSICAL THERAPY PROSTHETIC TREATMENT   Patient Name: Scott Navarro. MRN: [id number] DOB:1965/03/26, 58 y.o., male Today's Date: 01/12/2024  END OF SESSION:  PT End of Session - 01/12/24 1150     Visit Number 4    Number of Visits 25    Date for Recertification  03/25/24    Authorization Type [employer] other    Authorization - Visit Number 4    Authorization - Number of Visits 90    PT Start Time 1150    PT Stop Time 1230    PT Time Calculation (min) 40 min    Equipment Utilized During Treatment Gait belt    Activity Tolerance Patient tolerated treatment well    Behavior During Therapy WFL for tasks assessed/performed             Past Medical History:  Diagnosis Date   Type II diabetes mellitus (HCC) dx'd 10/08/2016   Past Surgical History:  Procedure Laterality Date   AMPUTATION Right 10/09/2016   Procedure: INCISION AND DRAINAGE RIGHT GREAT TOE;  Surgeon: Harden Jerona GAILS, MD;  Location: [facility] OR;  Service: Orthopedics;  Laterality: Right;   AMPUTATION Left 07/23/2023   Procedure: AMPUTATION, BELOW KNEE, LEFT;  Surgeon: Harden Jerona GAILS, MD;  Location: [facility] OR;  Service: Orthopedics;  Laterality: Left;  IRRIGATION AND DEBRIDEMENT AND AMPUTATION OF LEFT LEG   IRRIGATION AND DEBRIDEMENT KNEE Left 07/25/2023   Procedure: IRRIGATION AND DEBRIDEMENT LEFT KNEE;  Surgeon: Harden Jerona GAILS, MD;  Location: [facility] OR;  Service: Orthopedics;  Laterality: Left;  DEBRIDEMENT LEFT LEG   Patient Active Problem List   Diagnosis Date Noted   Coping style affecting medical condition 08/05/2023   S/P BKA (below knee amputation), left (HCC) 07/30/2023   ARF (acute renal failure) 07/23/2023   Hyperlipidemia 07/23/2023   Amputation below knee (HCC) 07/23/2023   Necrotizing fasciitis (HCC) 07/22/2023   Anemia 07/22/2023   Hyponatremia 07/22/2023   Diabetic retinopathy associated with type 2 diabetes mellitus (HCC) 02/18/2023   Charcot joint of left foot 02/18/2023   Encounter for lipid  screening for cardiovascular disease 02/18/2023   Screening for prostate cancer 02/18/2023   Type 2 diabetes mellitus with hyperglycemia (HCC) 11/28/2022   Gait instability 11/28/2022   Retinal hemorrhage 11/21/2022   Glaucoma 11/21/2022   Achilles tendon contracture, right 11/21/2016   Hypokalemia    Class 1 obesity due to excess calories with body mass index (BMI) of 33.0 to 33.9 in adult    Wound infection 10/09/2016   Hyperglycemia 10/09/2016   Elevated blood pressure reading 10/09/2016   Diabetic polyneuropathy associated with type 2 diabetes mellitus (HCC)    Laceration w FB of right great toe w/o damage to nail, init     PCP: Bulah Alm RAMAN, PA-C  REFERRING PROVIDER: Maurilio CHRISTELLA Collet, PA-C  ONSET DATE:  12/26/2023 prosthesis delivery  REFERRING DIAG: S10.487 (ICD-10-CM) - S/P BKA (below knee amputation), left   THERAPY DIAG:  Other abnormalities of gait and mobility  Unsteadiness on feet  Muscle weakness (generalized)  Foot drop, right  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  He thinks AFO is helping.    Pt accompanied by: family member  PERTINENT HISTORY: left TTA 07/23/23, DM2, polyneuropathy, Acute renal failure, HLD  PAIN:  Are you having pain? No  PRECAUTIONS: Fall  RED FLAGS: None   WEIGHT BEARING RESTRICTIONS: No  FALLS: Has patient fallen in last 6 months? No  LIVING ENVIRONMENT: Lives with: lives with spouse and mother with Alzheimer's. He is currently living with son who had recent medical issue making him bed / wheelchair bound.  His wife & he are son's caregivers but think is not permanent situation.   Lives in: House his house is 2 story development worker, community / bedroom main level) and son's house is single level (where they are currently staying).  Home  Access: 3 steps to main entrance to his home and son's house has ramp.  Stairs: Yes: Internal: 14 steps; on right going up and External: 3 steps; on left going up Has following equipment at home: Single point cane, Walker - 2 wheeled, Wheelchair (manual), Shower bench, and bed side commode  PLOF: Independent, Independent with household mobility without device, and Independent with community mobility without device  Occupation: retired  PATIENT GOALS: to use prosthesis normal activities: driving, shopping, yard work,  active at place in [employer],   OBJECTIVE:  Patient-Specific Activity Scoring Scheme  0 represents "unable to perform." 10 represents "able to perform at prior level. 0 1 2 3 4 5 6 7 8 9  10 (Date and Score)   Activity Eval 12/29/23    1. Care & wear of prosthesis  1    2. Standing ADLs   2    3. Walking  2   4. Outdoor activities 0   5.    Score 1.25    Total score = sum of the activity scores/number of activities Minimum detectable change (90%CI) for average score = 2 points Minimum detectable change (90%CI) for single activity score = 3 points  COGNITION: Evaluation on 12/29/2023: Overall cognitive status: Within functional limits for tasks assessed   SENSATION: Evaluation on 12/29/2023: [facility]  CARDIOVASCULAR RESPONSE: Evaluation on 12/29/2023: Functional activity: standing balance & gait Post-activity vitals: HR: 116 SpO2: 100% Modified Borg scale for dyspnea: 2: mild shortness of breath  POSTURE:  Evaluation on 12/29/2023: rounded shoulders, forward head, flexed trunk , and weight shift right  LOWER EXTREMITY ROM:  ROM P:passive  A:active Right Eval 12/29/23 Left Eval 12/29/23  Hip flexion    Hip extension    Hip abduction    Hip adduction    Hip internal rotation    Hip external rotation    Knee flexion    Knee extension  0*  Ankle dorsiflexion    Ankle plantarflexion    Ankle inversion    Ankle eversion     (Blank rows  = not tested)  LOWER EXTREMITY MMT:  MMT Right Eval 12/29/23 Left Eval 12/2723  Hip flexion    Hip extension 3/5   Hip abduction 3/5   Hip adduction    Hip internal rotation    Hip external rotation    Knee flexion 3/5   Knee extension 3/5 4/5  Ankle dorsiflexion  2-/5  Ankle plantarflexion    Ankle inversion    Ankle eversion    At Evaluation all strength testing is grossly seated and functionally standing / gait. (Blank rows = not tested)  TRANSFERS: Evaluation on 12/29/2023: Sit to stand: SBA from 22 w/c requires use of armrests and back of legs against chair &/or RW for stabilization. Stand to sit: SBA RW to 22 w/c requires use of armrests and back of legs against chair &/or RW for stabilization.  FUNCTIONAL TESTs:  Evaluation on 12/29/2023: Lars Balance  12/56  [facility] PT Assessment - 12/29/23 0900       Standardized Balance Assessment   Standardized Balance Assessment Berg Balance Test      Berg Balance Test   Sit to Stand Needs minimal aid to stand or to stabilize    Standing Unsupported Needs several tries to stand 30 seconds unsupported    Sitting with Back Unsupported but Feet Supported on Floor or Stool Able to sit safely and securely 2 minutes    Stand to Sit Controls descent by using hands    Transfers Able to transfer safely, definite need of hands    Standing Unsupported with Eyes Closed Needs help to keep from falling    Standing Unsupported with Feet Together Needs help to attain position and unable to hold for 15 seconds    From Standing, Reach Forward with Outstretched Arm Loses balance while trying/requires external support    From Standing Position, Pick up Object from Floor Unable to try/needs assist to keep balance    From Standing Position, Turn to Look Behind Over each Shoulder Needs assist to keep from losing balance and falling    Turn 360 Degrees Needs assistance while turning    Standing Unsupported, Alternately Place Feet on Step/Stool  Needs assistance to keep from falling or unable to try    Standing Unsupported, One Foot in [employer] balance while stepping or standing    Standing on One Leg Unable to try or needs assist to prevent fall    Total Score 12    Berg comment: < 36 high risk for falls (close to 100%) 46-51 moderate (>50%)   37-45 significant (>80%) 52-55 lower (> 25%)          GAIT: Evaluation on 12/29/2023: Distance walked: 60' Assistive device utilized: [employer] - 2 wheeled and TTA prosthesis Level of assistance: Min A (multiple balance losses requiring PT assist to stabilize especially in turns) Gait pattern: step through pattern, decreased step length- Right, decreased stance time- Left, decreased hip/knee flexion- Left, decreased ankle dorsiflexion- Right, Right hip hike, Left hip hike, genu recurvatum- Right, antalgic, trendelenburg, lateral hip instability, trunk flexed, and poor foot clearance- Right, left knee instability / flexion moment intermittently  Gait velocity:  0.97 ft/sec Comments: excess UE weight bearing on RW.    CURRENT PROSTHETIC WEAR ASSESSMENT: Evaluation on 12/29/2023:  Patient is dependent with: skin check, residual limb care, care of non-amputated limb, prosthetic cleaning, ply sock cleaning, correct ply sock adjustment, proper wear schedule/adjustment, and proper weight-bearing schedule/adjustment Donning prosthesis: SBA Doffing prosthesis: SBA Prosthetic wear tolerance: up to 1.5 hours, 2x/day, 2 of 3 days since delivery Prosthetic weight bearing tolerance: 5 minutes with no c/o residual limb pain Edema: pitting Residual limb condition: cylindrical shape, 3 cm superficial scab at anterior-lateral limb with no signs of infection, small wound on lateral limb proximal to fibular head with white covering 0.4 cm, invaginated scar lateral aspect, dry flaking skin, no hair growth, redness over patella (friction from liner when seated), normal temperature.  Prosthetic description:  silicon (9 mm anterior portion) with pin lock suspension, total contact socket with flexible inner socket.    TODAY'S TREATMENT:  DATE:  01/12/2024: Prosthetic Care with left Transtibial  prosthesis and right blue rocker AFO: Residual limb has no open areas.  Some redness over tibial crest and patella. PT recommended increasing wear to 4 hrs on, 2 hrs off, 4hrs on, 2 hrs off, then 3rd time until bedtime. PT reviewed adjusting ply socks. Pt verbalized understanding.   Pt amb 50' X 2 with RW with CGA.  Pt neg 12* ramp and 6.5 curb with RW with minA and cues on technique.    Neuromuscular Re-education: HEP at sink working on proprioception with residual limb, balance and weight shifts.   See below for HEP.    Therapeutic Activities: Nustep seat 10 with BLEs/BUEs level 7 for 8 min.     TREATMENT:                                                                                                                             DATE:  01/07/2024: Prosthetic Care with left Transtibial  prosthesis and right blue rocker AFO: PT verbally and demo educated patient on donning and use of right blue rocker AFO.  Patient verbalized understanding. Patient ambulated 40' x 2 with RW with minA.  He had significantly improved foot clearance of right foot with use of the AFO.  Patient also had less balance issues requiring increased assistance to prevent falls. PT demo and verbal cues on negotiating curb with RW, TTA prosthesis and AFO.  Patient negotiated 6.5 curb with modA.   PT demo and verbal cues on proper sit to/from stand technique.  Patient able to arise from 18 inch chair without armrests using BUEs on seat with minA for balance. Patient ambulated 200' with RW with minA with visual and verbal cues for for proper step width, upright posture and weight shift over stance lower  extremity.    TREATMENT:                                                                                                                             DATE:  01/05/2024: Prosthetic Care with left Transtibial  prosthesis: Patient has no open areas on residual limb now.  PT recommended washing & drying invaginated area thoroughly.  PT recommended increasing wear to 3 hrs 3x/day with 2 hours off bw wears.  Pt verbalized understanding.  PT instructed in adjusting ply socks by donning, ambulating & checking patella depth into socket with too few, too many  and correct ply fit.  Pt amb 25' X 3 with RW with min/CGA.   PT used band on RW as visual cue for proper step width.  Pt amb 120' with RW with CGA with carryover.   PT instructed how to fix brake on his w/c and pt verbalized understanding.     TREATMENT:                                                                                                                             DATE:  12/29/2023: Prosthetic Care with left Transtibial  prosthesis: See patient education below.    HOME EXERCISE PROGRAM: Do each exercise 1-2  times per day Do each exercise 5-10 repetitions Hold each exercise for 2 seconds to feel your location  AT SINK FIND YOUR MIDLINE POSITION AND PLACE FEET EQUAL DISTANCE FROM THE MIDLINE.  Try to find this position when standing still for activities.   USE TAPE ON FLOOR TO MARK THE MIDLINE POSITION which is even with middle of sink.  You also should try to feel with your limb pressure in socket.  You are trying to feel with limb what you used to feel with the bottom of your foot.  Side to Side Shift: Moving your hips only (not shoulders): move weight onto your left leg, HOLD/FEEL pressure in socket.  Move back to equal weight on each leg, HOLD/FEEL pressure in socket. Move weight onto your right leg, HOLD/FEEL pressure in socket. Move back to equal weight on each leg, HOLD/FEEL pressure in socket. Repeat.  Start with both hands  on sink, progress to hand on prosthetic side only Front to Back Shift: Moving your hips only (not shoulders): move your weight forward onto your toes, HOLD/FEEL pressure in socket. Move your weight back to equal Flat Foot on both legs, HOLD/FEEL  pressure in socket. Move your weight back onto your heels, HOLD/FEEL  pressure in socket. Move your weight back to equal on both legs, HOLD/FEEL  pressure in socket. Repeat.  Start with both hands on sink, progress to hand on prosthetic side only Moving Cones / Cups: With equal weight on each leg: Hold on with one hand the first time, then progress to no hand supports. Move cups from one side of sink to the other. Place cups ~2" out of your reach, progress to 10" beyond reach.  Place one hand in middle of sink and reach with other hand. Do both arms.  Overhead/Upward Reaching: alternated reaching up to top cabinets or ceiling if no cabinets present. Keep equal weight on each leg. Start with one hand support on counter while other hand reaches and progress to no hand support with reaching.  ace one hand in middle of sink and reach with other hand. Do both arms.  5.   Looking Over Shoulders: With equal weight on each leg: alternate turning to look over your shoulders with one hand support on counter as needed.  Start with  head motions only to look in front of shoulder, then even with shoulder and progress to looking behind you. To look to side, move head /eyes, then shoulder on side looking pulls back, shift more weight to side looking and pull hip back. Place one hand in middle of sink and let go with other hand so your shoulder can pull back. Switch hands to look other way.   6.  Stepping into lower cabinet:  Move items under cabinet out of your way. Shift your hips/pelvis so weight on prosthesis. Tighten muscles in hip on prosthetic side.  SLOWLY step other leg so front of foot is in cabinet. Then step back to floor. Repeat with other leg.     ASSESSMENT:  CLINICAL IMPRESSION: Patient appears to understand HEP for balance.  Pt improved his ability to negotiate ramp & curb with RW but still needs assist for safety.  Patient continues to benefit from skilled physical therapy to improve safety and function with his prosthesis.  OBJECTIVE IMPAIRMENTS: Abnormal gait, decreased activity tolerance, decreased balance, decreased endurance, decreased knowledge of condition, decreased knowledge of use of DME, decreased mobility, difficulty walking, decreased strength, increased edema, postural dysfunction, prosthetic dependency , and impaired skin integrity.   ACTIVITY LIMITATIONS: carrying, lifting, bending, sitting, standing, stairs, transfers, reach over head, and locomotion level  PARTICIPATION LIMITATIONS: meal prep, driving, shopping, community activity, and yard work  PERSONAL FACTORS: Fitness, Time since onset of injury/illness/exacerbation, and 3+ comorbidities: see PMH are also affecting patient's functional outcome.   REHAB POTENTIAL: Good  CLINICAL DECISION MAKING: Evolving/moderate complexity  EVALUATION COMPLEXITY: Moderate   GOALS: Goals reviewed with patient? Yes  SHORT TERM GOALS: Target date: 01/29/2024  Patient donnes prosthesis modified independent & verbalizes proper cleaning. Baseline: SEE OBJECTIVE DATA Goal status: Ongoing   01/12/2024 2.  Patient tolerates prosthesis >10 hrs total /day without skin issues or limb pain >5/10 after standing. Baseline: SEE OBJECTIVE DATA Goal status: Ongoing   01/12/2024  3.  Patient able to reach 7, pick up object from floor and look over both shoulders with RW support with supervision. Baseline: SEE OBJECTIVE DATA Goal status: Ongoing   01/12/2024  4. Patient ambulates 29' with RW & prosthesis with supervision. Baseline: SEE OBJECTIVE DATA Goal status: Ongoing   01/12/2024  5. Patient negotiates ramps & curbs with RW & prosthesis with minA. Baseline: SEE  OBJECTIVE DATA Goal status: Ongoing   01/12/2024  [place] TERM GOALS: Target date: 03/25/2024  Patient demonstrates & verbalized understanding of prosthetic care to enable safe utilization of prosthesis. Baseline: SEE OBJECTIVE DATA Goal status: Ongoing   01/12/2024  Patient tolerates prosthesis wear >90% of awake hours without skin or limb pain issues. Baseline: SEE OBJECTIVE DATA Goal status: Ongoing   01/12/2024  Berg Balance >30/56 to indicate lower fall risk Baseline: SEE OBJECTIVE DATA Goal status: Ongoing   01/12/2024  Patient ambulates >300' with prosthesis & LRAD independently Baseline: SEE OBJECTIVE DATA Goal status: Ongoing   01/12/2024  Patient negotiates ramps, curbs & stairs with single rail with prosthesis & LRAD independently. Baseline: SEE OBJECTIVE DATA Goal status: Ongoing   01/12/2024  Patient reports Patient-Specific Activity Score average to >/= 5 to indicate improvement in functional activities.   Baseline: SEE OBJECTIVE DATA Goal status: Ongoing   01/12/2024  PLAN:  PT FREQUENCY: 2x/week  PT DURATION: 12 weeks  PLANNED INTERVENTIONS: 97164- PT Re-evaluation, 97750- Physical Performance Testing, 97110-Therapeutic exercises, 97530- Therapeutic activity, [id number]- Neuromuscular re-education, 97535- Self Care, [id number]- Gait training, [phone]- Prosthetic Initial ,  02236- Orthotic/Prosthetic subsequent, Patient/Family education, Balance training, Stair training, Scar mobilization, Vestibular training, and DME instructions  PLAN FOR NEXT SESSION:    Continue prosthetic gait with RW, prosthesis and AFO.  Standing balance activities.     Grayce Spatz, PT, DPT 01/12/2024, 12:57 PM

## 2024-01-12 NOTE — Patient Instructions (Signed)
 Do each exercise 1-2  times per day Do each exercise 5-10 repetitions Hold each exercise for 2 seconds to feel your location  AT SINK FIND YOUR MIDLINE POSITION AND PLACE FEET EQUAL DISTANCE FROM THE MIDLINE.  Try to find this position when standing still for activities.   USE TAPE ON FLOOR TO MARK THE MIDLINE POSITION which is even with middle of sink.  You also should try to feel with your limb pressure in socket.  You are trying to feel with limb what you used to feel with the bottom of your foot.  Side to Side Shift: Moving your hips only (not shoulders): move weight onto your left leg, HOLD/FEEL pressure in socket.  Move back to equal weight on each leg, HOLD/FEEL pressure in socket. Move weight onto your right leg, HOLD/FEEL pressure in socket. Move back to equal weight on each leg, HOLD/FEEL pressure in socket. Repeat.  Start with both hands on sink, progress to hand on prosthetic side only Front to Back Shift: Moving your hips only (not shoulders): move your weight forward onto your toes, HOLD/FEEL pressure in socket. Move your weight back to equal Flat Foot on both legs, HOLD/FEEL  pressure in socket. Move your weight back onto your heels, HOLD/FEEL  pressure in socket. Move your weight back to equal on both legs, HOLD/FEEL  pressure in socket. Repeat.  Start with both hands on sink, progress to hand on prosthetic side only Moving Cones / Cups: With equal weight on each leg: Hold on with one hand the first time, then progress to no hand supports. Move cups from one side of sink to the other. Place cups ~2" out of your reach, progress to 10" beyond reach.  Place one hand in middle of sink and reach with other hand. Do both arms.  Overhead/Upward Reaching: alternated reaching up to top cabinets or ceiling if no cabinets present. Keep equal weight on each leg. Start with one hand support on counter while other hand reaches and progress to no hand support with reaching.  ace one hand in middle of  sink and reach with other hand. Do both arms.  5.   Looking Over Shoulders: With equal weight on each leg: alternate turning to look over your shoulders with one hand support on counter as needed.  Start with head motions only to look in front of shoulder, then even with shoulder and progress to looking behind you. To look to side, move head /eyes, then shoulder on side looking pulls back, shift more weight to side looking and pull hip back. Place one hand in middle of sink and let go with other hand so your shoulder can pull back. Switch hands to look other way.   6.  Stepping into lower cabinet:  Move items under cabinet out of your way. Shift your hips/pelvis so weight on prosthesis. Tighten muscles in hip on prosthetic side.  SLOWLY step other leg so front of foot is in cabinet. Then step back to floor. Repeat with other leg.

## 2024-01-15 NOTE — Therapy (Signed)
 OUTPATIENT PHYSICAL THERAPY PROSTHETIC TREATMENT   Patient Name: Scott Navarro. MRN: [id number] DOB:[birth year], 58 y.o., male Today's Date: 01/16/2024  END OF SESSION:  PT End of Session - 01/16/24 1017     Visit Number 5    Number of Visits 25    Date for Recertification  03/25/24    Authorization Type [employer] other    Authorization - Visit Number 5    Authorization - Number of Visits 90    PT Start Time 1017    PT Stop Time 1058    PT Time Calculation (min) 41 min    Equipment Utilized During Treatment Gait belt    Activity Tolerance Patient tolerated treatment well    Behavior During Therapy WFL for tasks assessed/performed              Past Medical History:  Diagnosis Date   Type II diabetes mellitus (HCC) dx'd 10/08/2016   Past Surgical History:  Procedure Laterality Date   AMPUTATION Right 10/09/2016   Procedure: INCISION AND DRAINAGE RIGHT GREAT TOE;  Surgeon: Harden Jerona GAILS, MD;  Location: [facility] OR;  Service: Orthopedics;  Laterality: Right;   AMPUTATION Left 07/23/2023   Procedure: AMPUTATION, BELOW KNEE, LEFT;  Surgeon: Harden Jerona GAILS, MD;  Location: [facility] OR;  Service: Orthopedics;  Laterality: Left;  IRRIGATION AND DEBRIDEMENT AND AMPUTATION OF LEFT LEG   IRRIGATION AND DEBRIDEMENT KNEE Left 07/25/2023   Procedure: IRRIGATION AND DEBRIDEMENT LEFT KNEE;  Surgeon: Harden Jerona GAILS, MD;  Location: [facility] OR;  Service: Orthopedics;  Laterality: Left;  DEBRIDEMENT LEFT LEG   Patient Active Problem List   Diagnosis Date Noted   Coping style affecting medical condition 08/05/2023   S/P BKA (below knee amputation), left (HCC) 07/30/2023   ARF (acute renal failure) 07/23/2023   Hyperlipidemia 07/23/2023   Amputation below knee (HCC) 07/23/2023   Necrotizing fasciitis (HCC) 07/22/2023   Anemia 07/22/2023   Hyponatremia 07/22/2023   Diabetic retinopathy associated with type 2 diabetes mellitus (HCC) 02/18/2023   Charcot joint of left foot 02/18/2023   Encounter for lipid  screening for cardiovascular disease 02/18/2023   Screening for prostate cancer 02/18/2023   Type 2 diabetes mellitus with hyperglycemia (HCC) 11/28/2022   Gait instability 11/28/2022   Retinal hemorrhage 11/21/2022   Glaucoma 11/21/2022   Achilles tendon contracture, right 11/21/2016   Hypokalemia    Class 1 obesity due to excess calories with body mass index (BMI) of 33.0 to 33.9 in adult    Wound infection 10/09/2016   Hyperglycemia 10/09/2016   Elevated blood pressure reading 10/09/2016   Diabetic polyneuropathy associated with type 2 diabetes mellitus (HCC)    Laceration w FB of right great toe w/o damage to nail, init     PCP: Bulah Alm RAMAN, PA-C  REFERRING PROVIDER: Maurilio CHRISTELLA Collet, PA-C  ONSET DATE:  12/26/2023 prosthesis delivery  REFERRING DIAG: S10.487 (ICD-10-CM) - S/P BKA (below knee amputation), left   THERAPY DIAG:  Other abnormalities of gait and mobility  Unsteadiness on feet  Muscle weakness (generalized)  Foot drop, right  Rationale for Evaluation and Treatment: Rehabilitation  SUBJECTIVE:   SUBJECTIVE STATEMENT: Patient reports doing really well with prosthesis wear.  Pt accompanied by: family member  PERTINENT HISTORY: left TTA 07/23/23, DM2, polyneuropathy, Acute renal failure, HLD  PAIN:  Are you having pain? No  PRECAUTIONS: Fall  RED FLAGS: None   WEIGHT BEARING RESTRICTIONS: No  FALLS: Has patient fallen in last 6 months? No  LIVING ENVIRONMENT:  Lives with: lives with spouse and mother with Alzheimer's. He is currently living with son who had recent medical issue making him bed / wheelchair bound.  His wife & he are son's caregivers but think is not permanent situation.   Lives in: House his house is 2 story development worker, community / bedroom main level) and son's house is single level (where they are currently staying).  Home Access: 3 steps to main entrance to his home and son's house has ramp.  Stairs: Yes: Internal: 14 steps; on right  going up and External: 3 steps; on left going up Has following equipment at home: Single point cane, Walker - 2 wheeled, Wheelchair (manual), Shower bench, and bed side commode  PLOF: Independent, Independent with household mobility without device, and Independent with community mobility without device  Occupation: retired  PATIENT GOALS: to use prosthesis normal activities: driving, shopping, yard work,  active at place in [employer],   OBJECTIVE:  Patient-Specific Activity Scoring Scheme  0 represents "unable to perform." 10 represents "able to perform at prior level. 0 1 2 3 4 5 6 7 8 9  10 (Date and Score)   Activity Eval 12/29/23    1. Care & wear of prosthesis  1    2. Standing ADLs   2    3. Walking  2   4. Outdoor activities 0   5.    Score 1.25    Total score = sum of the activity scores/number of activities Minimum detectable change (90%CI) for average score = 2 points Minimum detectable change (90%CI) for single activity score = 3 points  COGNITION: Evaluation on 12/29/2023: Overall cognitive status: Within functional limits for tasks assessed   SENSATION: Evaluation on 12/29/2023: [facility]  CARDIOVASCULAR RESPONSE: Evaluation on 12/29/2023: Functional activity: standing balance & gait Post-activity vitals: HR: 116 SpO2: 100% Modified Borg scale for dyspnea: 2: mild shortness of breath  POSTURE:  Evaluation on 12/29/2023: rounded shoulders, forward head, flexed trunk , and weight shift right  LOWER EXTREMITY ROM:  ROM P:passive  A:active Right Eval 12/29/23 Left Eval 12/29/23  Hip flexion    Hip extension    Hip abduction    Hip adduction    Hip internal rotation    Hip external rotation    Knee flexion    Knee extension  0*  Ankle dorsiflexion    Ankle plantarflexion    Ankle inversion    Ankle eversion     (Blank rows = not tested)  LOWER EXTREMITY MMT:  MMT Right Eval 12/29/23 Left Eval 12/2723  Hip flexion    Hip  extension 3/5   Hip abduction 3/5   Hip adduction    Hip internal rotation    Hip external rotation    Knee flexion 3/5   Knee extension 3/5 4/5  Ankle dorsiflexion  2-/5  Ankle plantarflexion    Ankle inversion    Ankle eversion    At Evaluation all strength testing is grossly seated and functionally standing / gait. (Blank rows = not tested)  TRANSFERS: Evaluation on 12/29/2023: Sit to stand: SBA from 22 w/c requires use of armrests and back of legs against chair &/or RW for stabilization. Stand to sit: SBA RW to 22 w/c requires use of armrests and back of legs against chair &/or RW for stabilization.  FUNCTIONAL TESTs:  Evaluation on 12/29/2023: Lars Balance 12/56  [facility] PT Assessment - 12/29/23 0900       Standardized Balance Assessment   Standardized Balance Assessment  Berg Balance Test      Berg Balance Test   Sit to Stand Needs minimal aid to stand or to stabilize    Standing Unsupported Needs several tries to stand 30 seconds unsupported    Sitting with Back Unsupported but Feet Supported on Floor or Stool Able to sit safely and securely 2 minutes    Stand to Sit Controls descent by using hands    Transfers Able to transfer safely, definite need of hands    Standing Unsupported with Eyes Closed Needs help to keep from falling    Standing Unsupported with Feet Together Needs help to attain position and unable to hold for 15 seconds    From Standing, Reach Forward with Outstretched Arm Loses balance while trying/requires external support    From Standing Position, Pick up Object from Floor Unable to try/needs assist to keep balance    From Standing Position, Turn to Look Behind Over each Shoulder Needs assist to keep from losing balance and falling    Turn 360 Degrees Needs assistance while turning    Standing Unsupported, Alternately Place Feet on Step/Stool Needs assistance to keep from falling or unable to try    Standing Unsupported, One Foot in [employer] balance  while stepping or standing    Standing on One Leg Unable to try or needs assist to prevent fall    Total Score 12    Berg comment: < 36 high risk for falls (close to 100%) 46-51 moderate (>50%)   37-45 significant (>80%) 52-55 lower (> 25%)          GAIT: Evaluation on 12/29/2023: Distance walked: 60' Assistive device utilized: [employer] - 2 wheeled and TTA prosthesis Level of assistance: Min A (multiple balance losses requiring PT assist to stabilize especially in turns) Gait pattern: step through pattern, decreased step length- Right, decreased stance time- Left, decreased hip/knee flexion- Left, decreased ankle dorsiflexion- Right, Right hip hike, Left hip hike, genu recurvatum- Right, antalgic, trendelenburg, lateral hip instability, trunk flexed, and poor foot clearance- Right, left knee instability / flexion moment intermittently  Gait velocity:  0.97 ft/sec Comments: excess UE weight bearing on RW.    CURRENT PROSTHETIC WEAR ASSESSMENT: Evaluation on 12/29/2023:  Patient is dependent with: skin check, residual limb care, care of non-amputated limb, prosthetic cleaning, ply sock cleaning, correct ply sock adjustment, proper wear schedule/adjustment, and proper weight-bearing schedule/adjustment Donning prosthesis: SBA Doffing prosthesis: SBA Prosthetic wear tolerance: up to 1.5 hours, 2x/day, 2 of 3 days since delivery Prosthetic weight bearing tolerance: 5 minutes with no c/o residual limb pain Edema: pitting Residual limb condition: cylindrical shape, 3 cm superficial scab at anterior-lateral limb with no signs of infection, small wound on lateral limb proximal to fibular head with white covering 0.4 cm, invaginated scar lateral aspect, dry flaking skin, no hair growth, redness over patella (friction from liner when seated), normal temperature.  Prosthetic description: silicon (9 mm anterior portion) with pin lock suspension, total contact socket with flexible inner socket.     TODAY'S TREATMENT:  DATE:  01/16/2024: Prosthetic Care with left Transtibial  prosthesis and right blue rocker AFO: PT discussed appropriate ply sock wear at beginning of session and assessed patient's Lt LE within prosthesis at end of session and discussed requirement for adding socks for the remaining of the day.  PT discussed and educated on the physical and mental challenges following amputation.  PT educated patient on the progression from RW to no AD (RW to bilat canes to unilat cane to no AD). PT demonstrated 4-point ambulation with bilat canes using SPC with stand alone tips while verbalizing 1-2-3-4 and discussing appropriate placement of canes to assist with appropriate step length and foot placement. Patient then attempted to perform ambulation with this technique within parallel bars 1x down and back with close CGA from PT. Patient highly unstable with this technique.  Patient ambulated with RW and CGA for 89.5' and 67.5' with narrow BOS, ER of bilat LEs, and increased hip flexion of Rt LE with Rt swing.   Neuro Re-Ed:  Patient performed parts of HEP at sink with CGA-mod A for review and to increase weightbearing, weight shifting, and stability. Patient performed fwd/back weight shifts 1x10 slowly and with intermittent requirement for UE use on sink and up to min A from PT secondary to fwd and posterior LOB.  Then patient performed lateral weight shifts 1x10 slowly and with intermittent UE use and min A secondary to LOB. Lastly, patient performed Lt weight shift with Rt step fwd/back with intermittent mod A secondary to LOB and multimodal cueing required for appropriate form. Though patient required mod A for one instance of LOB, patient did perform an appropriate Lt stepping reaction during LOB. Patient would benefit from continued focus on static stability  activities.  Patient performed Lt weight shift with Rt fwd/back step 1x5 with CGA with mirror use for visual feedback and bilat UE use for stability. Patient attempted to perform without UE use with high levels of instability.   Patient ambulated in parallel bars 1x with bilat UE use using mirror for visual feedback. Patient then decreased UE use to Rt UE only to simulate cane 1x3 down and back.  Patient ambulated fwd/back 1x2 down and back in parallel bars with bilat UE and CGA. Patient attempted to perform with unilat UE and without UE use, though showed high increases in instability. PT discussed change in balance strategies and proprioception following amputation.     TODAY'S TREATMENT:                                                                                                                             DATE:  01/12/2024: Prosthetic Care with left Transtibial  prosthesis and right blue rocker AFO: Residual limb has no open areas.  Some redness over tibial crest and patella. PT recommended increasing wear to 4 hrs on, 2 hrs off, 4hrs on, 2 hrs off, then 3rd time until bedtime. PT reviewed adjusting ply socks. Pt verbalized understanding.  Pt amb 50' X 2 with RW with CGA.  Pt neg 12* ramp and 6.5 curb with RW with minA and cues on technique.    Neuromuscular Re-education: HEP at sink working on proprioception with residual limb, balance and weight shifts.   See below for HEP.    Therapeutic Activities: Nustep seat 10 with BLEs/BUEs level 7 for 8 min.     TREATMENT:                                                                                                                             DATE:  01/07/2024: Prosthetic Care with left Transtibial  prosthesis and right blue rocker AFO: PT verbally and demo educated patient on donning and use of right blue rocker AFO.  Patient verbalized understanding. Patient ambulated 40' x 2 with RW with minA.  He had significantly improved foot  clearance of right foot with use of the AFO.  Patient also had less balance issues requiring increased assistance to prevent falls. PT demo and verbal cues on negotiating curb with RW, TTA prosthesis and AFO.  Patient negotiated 6.5 curb with modA.   PT demo and verbal cues on proper sit to/from stand technique.  Patient able to arise from 18 inch chair without armrests using BUEs on seat with minA for balance. Patient ambulated 200' with RW with minA with visual and verbal cues for for proper step width, upright posture and weight shift over stance lower extremity.    TREATMENT:                                                                                                                             DATE:  01/05/2024: Prosthetic Care with left Transtibial  prosthesis: Patient has no open areas on residual limb now.  PT recommended washing & drying invaginated area thoroughly.  PT recommended increasing wear to 3 hrs 3x/day with 2 hours off bw wears.  Pt verbalized understanding.  PT instructed in adjusting ply socks by donning, ambulating & checking patella depth into socket with too few, too many and correct ply fit.  Pt amb 25' X 3 with RW with min/CGA.   PT used band on RW as visual cue for proper step width.  Pt amb 120' with RW with CGA with carryover.   PT instructed how to fix brake on his w/c and pt verbalized understanding.     TREATMENT:  DATE:  12/29/2023: Prosthetic Care with left Transtibial  prosthesis: See patient education below.    HOME EXERCISE PROGRAM: Do each exercise 1-2  times per day Do each exercise 5-10 repetitions Hold each exercise for 2 seconds to feel your location  AT SINK FIND YOUR MIDLINE POSITION AND PLACE FEET EQUAL DISTANCE FROM THE MIDLINE.  Try to find this position when standing still for activities.   USE TAPE ON FLOOR TO  MARK THE MIDLINE POSITION which is even with middle of sink.  You also should try to feel with your limb pressure in socket.  You are trying to feel with limb what you used to feel with the bottom of your foot.  Side to Side Shift: Moving your hips only (not shoulders): move weight onto your left leg, HOLD/FEEL pressure in socket.  Move back to equal weight on each leg, HOLD/FEEL pressure in socket. Move weight onto your right leg, HOLD/FEEL pressure in socket. Move back to equal weight on each leg, HOLD/FEEL pressure in socket. Repeat.  Start with both hands on sink, progress to hand on prosthetic side only Front to Back Shift: Moving your hips only (not shoulders): move your weight forward onto your toes, HOLD/FEEL pressure in socket. Move your weight back to equal Flat Foot on both legs, HOLD/FEEL  pressure in socket. Move your weight back onto your heels, HOLD/FEEL  pressure in socket. Move your weight back to equal on both legs, HOLD/FEEL  pressure in socket. Repeat.  Start with both hands on sink, progress to hand on prosthetic side only Moving Cones / Cups: With equal weight on each leg: Hold on with one hand the first time, then progress to no hand supports. Move cups from one side of sink to the other. Place cups ~2" out of your reach, progress to 10" beyond reach.  Place one hand in middle of sink and reach with other hand. Do both arms.  Overhead/Upward Reaching: alternated reaching up to top cabinets or ceiling if no cabinets present. Keep equal weight on each leg. Start with one hand support on counter while other hand reaches and progress to no hand support with reaching.  ace one hand in middle of sink and reach with other hand. Do both arms.  5.   Looking Over Shoulders: With equal weight on each leg: alternate turning to look over your shoulders with one hand support on counter as needed.  Start with head motions only to look in front of shoulder, then even with shoulder and progress to  looking behind you. To look to side, move head /eyes, then shoulder on side looking pulls back, shift more weight to side looking and pull hip back. Place one hand in middle of sink and let go with other hand so your shoulder can pull back. Switch hands to look other way.   6.  Stepping into lower cabinet:  Move items under cabinet out of your way. Shift your hips/pelvis so weight on prosthesis. Tighten muscles in hip on prosthetic side.  SLOWLY step other leg so front of foot is in cabinet. Then step back to floor. Repeat with other leg.    ASSESSMENT:  CLINICAL IMPRESSION: Patient arrived to session noting inconsistent compliance with HEP, though was able to walk at the hospital with family yesterday without using AD (used door frames, wall, and handrail when necessary) and no LOB or close falls. Following session, PT discussed with patient the importance of safety with ambulation and HEP and to continue  use of RW until increasing stability. Patient tolerated all activities this date and will continue to benefit from skilled PT in order to improve stability and safety.   OBJECTIVE IMPAIRMENTS: Abnormal gait, decreased activity tolerance, decreased balance, decreased endurance, decreased knowledge of condition, decreased knowledge of use of DME, decreased mobility, difficulty walking, decreased strength, increased edema, postural dysfunction, prosthetic dependency , and impaired skin integrity.   ACTIVITY LIMITATIONS: carrying, lifting, bending, sitting, standing, stairs, transfers, reach over head, and locomotion level  PARTICIPATION LIMITATIONS: meal prep, driving, shopping, community activity, and yard work  PERSONAL FACTORS: Fitness, Time since onset of injury/illness/exacerbation, and 3+ comorbidities: see PMH are also affecting patient's functional outcome.   REHAB POTENTIAL: Good  CLINICAL DECISION MAKING: Evolving/moderate complexity  EVALUATION COMPLEXITY: Moderate   GOALS: Goals  reviewed with patient? Yes  SHORT TERM GOALS: Target date: 01/29/2024  Patient donnes prosthesis modified independent & verbalizes proper cleaning. Baseline: SEE OBJECTIVE DATA Goal status: Ongoing   01/12/2024 2.  Patient tolerates prosthesis >10 hrs total /day without skin issues or limb pain >5/10 after standing. Baseline: SEE OBJECTIVE DATA Goal status: Ongoing   01/12/2024  3.  Patient able to reach 7, pick up object from floor and look over both shoulders with RW support with supervision. Baseline: SEE OBJECTIVE DATA Goal status: Ongoing   01/12/2024  4. Patient ambulates 102' with RW & prosthesis with supervision. Baseline: SEE OBJECTIVE DATA Goal status: Ongoing   01/12/2024  5. Patient negotiates ramps & curbs with RW & prosthesis with minA. Baseline: SEE OBJECTIVE DATA Goal status: Ongoing   01/12/2024  Kari TERM GOALS: Target date: 03/25/2024  Patient demonstrates & verbalized understanding of prosthetic care to enable safe utilization of prosthesis. Baseline: SEE OBJECTIVE DATA Goal status: Ongoing   01/12/2024  Patient tolerates prosthesis wear >90% of awake hours without skin or limb pain issues. Baseline: SEE OBJECTIVE DATA Goal status: Ongoing   01/12/2024  Berg Balance >30/56 to indicate lower fall risk Baseline: SEE OBJECTIVE DATA Goal status: Ongoing   01/12/2024  Patient ambulates >300' with prosthesis & LRAD independently Baseline: SEE OBJECTIVE DATA Goal status: Ongoing   01/12/2024  Patient negotiates ramps, curbs & stairs with single rail with prosthesis & LRAD independently. Baseline: SEE OBJECTIVE DATA Goal status: Ongoing   01/12/2024  Patient reports Patient-Specific Activity Score average to >/= 5 to indicate improvement in functional activities.   Baseline: SEE OBJECTIVE DATA Goal status: Ongoing   01/12/2024  PLAN:  PT FREQUENCY: 2x/week  PT DURATION: 12 weeks  PLANNED INTERVENTIONS: [id number]- PT Re-evaluation, 97750- Physical  Performance Testing, 97110-Therapeutic exercises, 97530- Therapeutic activity, [id number]- Neuromuscular re-education, [phone]- Self Care, [id number]- Gait training, [phone]- Prosthetic Initial , [phone]- Orthotic/Prosthetic subsequent, Patient/Family education, Balance training, Stair training, Scar mobilization, Vestibular training, and DME instructions  PLAN FOR NEXT SESSION:     Continue prosthetic gait with RW, prosthesis and AFO.  Standing balance activities.     Susannah Daring, PT, DPT 01/16/24 2:42 PM

## 2024-01-16 ENCOUNTER — Ambulatory Visit

## 2024-01-16 DIAGNOSIS — R2681 Unsteadiness on feet: Secondary | ICD-10-CM

## 2024-01-16 DIAGNOSIS — M21371 Foot drop, right foot: Secondary | ICD-10-CM | POA: Diagnosis not present

## 2024-01-16 DIAGNOSIS — M6281 Muscle weakness (generalized): Secondary | ICD-10-CM | POA: Diagnosis not present

## 2024-01-16 DIAGNOSIS — R2689 Other abnormalities of gait and mobility: Secondary | ICD-10-CM

## 2024-01-19 ENCOUNTER — Encounter: Payer: Self-pay | Admitting: Physical Therapy

## 2024-01-19 ENCOUNTER — Ambulatory Visit (INDEPENDENT_AMBULATORY_CARE_PROVIDER_SITE_OTHER): Admitting: Physical Therapy

## 2024-01-19 DIAGNOSIS — R2681 Unsteadiness on feet: Secondary | ICD-10-CM

## 2024-01-19 DIAGNOSIS — M6281 Muscle weakness (generalized): Secondary | ICD-10-CM

## 2024-01-19 DIAGNOSIS — R2689 Other abnormalities of gait and mobility: Secondary | ICD-10-CM

## 2024-01-19 DIAGNOSIS — M21371 Foot drop, right foot: Secondary | ICD-10-CM | POA: Diagnosis not present

## 2024-01-19 NOTE — Therapy (Signed)
 OUTPATIENT PHYSICAL THERAPY PROSTHETIC TREATMENT   Patient Name: Scott Navarro. MRN: [id number] DOB:12-Jun-1965, 58 y.o., male Today's Date: 01/19/2024  END OF SESSION:  PT End of Session - 01/19/24 1150     Visit Number 6    Number of Visits 25    Date for Recertification  03/25/24    Authorization Type [employer] other    Authorization - Visit Number 6    Authorization - Number of Visits 90    PT Start Time 1147    PT Stop Time 1228    PT Time Calculation (min) 41 min    Equipment Utilized During Treatment Gait belt    Activity Tolerance Patient tolerated treatment well    Behavior During Therapy WFL for tasks assessed/performed               Past Medical History:  Diagnosis Date   Type II diabetes mellitus (HCC) dx'd 10/08/2016   Past Surgical History:  Procedure Laterality Date   AMPUTATION Right 10/09/2016   Procedure: INCISION AND DRAINAGE RIGHT GREAT TOE;  Surgeon: Harden Jerona GAILS, MD;  Location: [facility] OR;  Service: Orthopedics;  Laterality: Right;   AMPUTATION Left 07/23/2023   Procedure: AMPUTATION, BELOW KNEE, LEFT;  Surgeon: Harden Jerona GAILS, MD;  Location: [facility] OR;  Service: Orthopedics;  Laterality: Left;  IRRIGATION AND DEBRIDEMENT AND AMPUTATION OF LEFT LEG   IRRIGATION AND DEBRIDEMENT KNEE Left 07/25/2023   Procedure: IRRIGATION AND DEBRIDEMENT LEFT KNEE;  Surgeon: Harden Jerona GAILS, MD;  Location: [facility] OR;  Service: Orthopedics;  Laterality: Left;  DEBRIDEMENT LEFT LEG   Patient Active Problem List   Diagnosis Date Noted   Coping style affecting medical condition 08/05/2023   S/P BKA (below knee amputation), left (HCC) 07/30/2023   ARF (acute renal failure) 07/23/2023   Hyperlipidemia 07/23/2023   Amputation below knee (HCC) 07/23/2023   Necrotizing fasciitis (HCC) 07/22/2023   Anemia 07/22/2023   Hyponatremia 07/22/2023   Diabetic retinopathy associated with type 2 diabetes mellitus (HCC) 02/18/2023   Charcot joint of left foot 02/18/2023   Encounter for lipid  screening for cardiovascular disease 02/18/2023   Screening for prostate cancer 02/18/2023   Type 2 diabetes mellitus with hyperglycemia (HCC) 11/28/2022   Gait instability 11/28/2022   Retinal hemorrhage 11/21/2022   Glaucoma 11/21/2022   Achilles tendon contracture, right 11/21/2016   Hypokalemia    Class 1 obesity due to excess calories with body mass index (BMI) of 33.0 to 33.9 in adult    Wound infection 10/09/2016   Hyperglycemia 10/09/2016   Elevated blood pressure reading 10/09/2016   Diabetic polyneuropathy associated with type 2 diabetes mellitus (HCC)    Laceration w FB of right great toe w/o damage to nail, init     PCP: Bulah Alm RAMAN, PA-C  REFERRING PROVIDER: Maurilio CHRISTELLA Collet, PA-C  ONSET DATE:  12/26/2023 prosthesis delivery  REFERRING DIAG: S10.487 (ICD-10-CM) - S/P BKA (below knee amputation), left   THERAPY DIAG:  Other abnormalities of gait and mobility  Unsteadiness on feet  Muscle weakness (generalized)  Foot drop, right  Rationale for Evaluation and Treatment: Rehabilitation  SUBJECTIVE:   SUBJECTIVE STATEMENT: He saw prosthetist this morning who said AFO was approved so he trimmed velcro.  They have not approved prosthetic foot yet.   Pt accompanied by: family member  PERTINENT HISTORY: left TTA 07/23/23, DM2, polyneuropathy, Acute renal failure, HLD  PAIN:  Are you having pain? No  PRECAUTIONS: Fall  RED FLAGS: None   WEIGHT  BEARING RESTRICTIONS: No  FALLS: Has patient fallen in last 6 months? No  LIVING ENVIRONMENT: Lives with: lives with spouse and mother with Alzheimer's. He is currently living with son who had recent medical issue making him bed / wheelchair bound.  His wife & he are son's caregivers but think is not permanent situation.   Lives in: House his house is 2 story development worker, community / bedroom main level) and son's house is single level (where they are currently staying).  Home Access: 3 steps to main entrance to his home and  son's house has ramp.  Stairs: Yes: Internal: 14 steps; on right going up and External: 3 steps; on left going up Has following equipment at home: Single point cane, Walker - 2 wheeled, Wheelchair (manual), Shower bench, and bed side commode  PLOF: Independent, Independent with household mobility without device, and Independent with community mobility without device  Occupation: retired  PATIENT GOALS: to use prosthesis normal activities: driving, shopping, yard work,  active at place in [employer],   OBJECTIVE:  Patient-Specific Activity Scoring Scheme  0 represents "unable to perform." 10 represents "able to perform at prior level. 0 1 2 3 4 5 6 7 8 9  10 (Date and Score)   Activity Eval 12/29/23    1. Care & wear of prosthesis  1    2. Standing ADLs   2    3. Walking  2   4. Outdoor activities 0   5.    Score 1.25    Total score = sum of the activity scores/number of activities Minimum detectable change (90%CI) for average score = 2 points Minimum detectable change (90%CI) for single activity score = 3 points  COGNITION: Evaluation on 12/29/2023: Overall cognitive status: Within functional limits for tasks assessed   SENSATION: Evaluation on 12/29/2023: [facility]  CARDIOVASCULAR RESPONSE: Evaluation on 12/29/2023: Functional activity: standing balance & gait Post-activity vitals: HR: 116 SpO2: 100% Modified Borg scale for dyspnea: 2: mild shortness of breath  POSTURE:  Evaluation on 12/29/2023: rounded shoulders, forward head, flexed trunk , and weight shift right  LOWER EXTREMITY ROM:  ROM P:passive  A:active Right Eval 12/29/23 Left Eval 12/29/23  Hip flexion    Hip extension    Hip abduction    Hip adduction    Hip internal rotation    Hip external rotation    Knee flexion    Knee extension  0*  Ankle dorsiflexion    Ankle plantarflexion    Ankle inversion    Ankle eversion     (Blank rows = not tested)  LOWER EXTREMITY MMT:  MMT  Right Eval 12/29/23 Left Eval 12/2723  Hip flexion    Hip extension 3/5   Hip abduction 3/5   Hip adduction    Hip internal rotation    Hip external rotation    Knee flexion 3/5   Knee extension 3/5 4/5  Ankle dorsiflexion  2-/5  Ankle plantarflexion    Ankle inversion    Ankle eversion    At Evaluation all strength testing is grossly seated and functionally standing / gait. (Blank rows = not tested)  TRANSFERS: Evaluation on 12/29/2023: Sit to stand: SBA from 22 w/c requires use of armrests and back of legs against chair &/or RW for stabilization. Stand to sit: SBA RW to 22 w/c requires use of armrests and back of legs against chair &/or RW for stabilization.  FUNCTIONAL TESTs:  Evaluation on 12/29/2023: Lars Balance 12/56  [facility] PT Assessment -  12/29/23 0900       Standardized Balance Assessment   Standardized Balance Assessment Berg Balance Test      Berg Balance Test   Sit to Stand Needs minimal aid to stand or to stabilize    Standing Unsupported Needs several tries to stand 30 seconds unsupported    Sitting with Back Unsupported but Feet Supported on Floor or Stool Able to sit safely and securely 2 minutes    Stand to Sit Controls descent by using hands    Transfers Able to transfer safely, definite need of hands    Standing Unsupported with Eyes Closed Needs help to keep from falling    Standing Unsupported with Feet Together Needs help to attain position and unable to hold for 15 seconds    From Standing, Reach Forward with Outstretched Arm Loses balance while trying/requires external support    From Standing Position, Pick up Object from Floor Unable to try/needs assist to keep balance    From Standing Position, Turn to Look Behind Over each Shoulder Needs assist to keep from losing balance and falling    Turn 360 Degrees Needs assistance while turning    Standing Unsupported, Alternately Place Feet on Step/Stool Needs assistance to keep from falling or unable  to try    Standing Unsupported, One Foot in [employer] balance while stepping or standing    Standing on One Leg Unable to try or needs assist to prevent fall    Total Score 12    Berg comment: < 36 high risk for falls (close to 100%) 46-51 moderate (>50%)   37-45 significant (>80%) 52-55 lower (> 25%)          GAIT: Evaluation on 12/29/2023: Distance walked: 60' Assistive device utilized: [employer] - 2 wheeled and TTA prosthesis Level of assistance: Min A (multiple balance losses requiring PT assist to stabilize especially in turns) Gait pattern: step through pattern, decreased step length- Right, decreased stance time- Left, decreased hip/knee flexion- Left, decreased ankle dorsiflexion- Right, Right hip hike, Left hip hike, genu recurvatum- Right, antalgic, trendelenburg, lateral hip instability, trunk flexed, and poor foot clearance- Right, left knee instability / flexion moment intermittently  Gait velocity:  0.97 ft/sec Comments: excess UE weight bearing on RW.    CURRENT PROSTHETIC WEAR ASSESSMENT: Evaluation on 12/29/2023:  Patient is dependent with: skin check, residual limb care, care of non-amputated limb, prosthetic cleaning, ply sock cleaning, correct ply sock adjustment, proper wear schedule/adjustment, and proper weight-bearing schedule/adjustment Donning prosthesis: SBA Doffing prosthesis: SBA Prosthetic wear tolerance: up to 1.5 hours, 2x/day, 2 of 3 days since delivery Prosthetic weight bearing tolerance: 5 minutes with no c/o residual limb pain Edema: pitting Residual limb condition: cylindrical shape, 3 cm superficial scab at anterior-lateral limb with no signs of infection, small wound on lateral limb proximal to fibular head with white covering 0.4 cm, invaginated scar lateral aspect, dry flaking skin, no hair growth, redness over patella (friction from liner when seated), normal temperature.  Prosthetic description: silicon (9 mm anterior portion) with pin lock  suspension, total contact socket with flexible inner socket.    TODAY'S TREATMENT:  DATE:  01/19/2024: Prosthetic Care with left Transtibial  prosthesis and right blue rocker AFO: Patient ambulated 140' & 60 with RW and CGA with verbal & visual cues for step width and upright posture.   Pt neg ramp with RW with minA 4 reps with PT demo & verbal cues on technique especially ascending to limit poor knee control.  Pt neg curb with RW with minA with cues on technique.   Neuro Re-Ed:  Standing balance to facilitate core stabilization and balance reactions.  UE resistance with green theraband single UEs and BUEs 10 reps each rows, forward reach and biceps curl.  Mod-MinA for balance reactions.   Standing with BUE support with slider forward and backward motions with focus on stance LE / knee stability 10 reps with each LE.   Standing LE resistance with green theraband figure 8 above knee level abduction and extension 10 reps ea with BUE support.  PT cues on upright posture.   Stepping LE stance onto Airex mat and contralateral LE stepping from floor posteriorly to tapping knee on counter anteriorly 10 reps with ea LE.  BUE support on sink & minA / tactile cues for balance reactions.      TREATMENT:                                                                                                                             DATE:  01/16/2024: Prosthetic Care with left Transtibial  prosthesis and right blue rocker AFO: PT discussed appropriate ply sock wear at beginning of session and assessed patient's Lt LE within prosthesis at end of session and discussed requirement for adding socks for the remaining of the day.  PT discussed and educated on the physical and mental challenges following amputation.  PT educated patient on the progression from RW to no AD (RW to bilat canes to  unilat cane to no AD). PT demonstrated 4-point ambulation with bilat canes using SPC with stand alone tips while verbalizing 1-2-3-4 and discussing appropriate placement of canes to assist with appropriate step length and foot placement. Patient then attempted to perform ambulation with this technique within parallel bars 1x down and back with close CGA from PT. Patient highly unstable with this technique.  Patient ambulated with RW and CGA for 89.5' and 67.5' with narrow BOS, ER of bilat LEs, and increased hip flexion of Rt LE with Rt swing.   Neuro Re-Ed:  Patient performed parts of HEP at sink with CGA-mod A for review and to increase weightbearing, weight shifting, and stability. Patient performed fwd/back weight shifts 1x10 slowly and with intermittent requirement for UE use on sink and up to min A from PT secondary to fwd and posterior LOB.  Then patient performed lateral weight shifts 1x10 slowly and with intermittent UE use and min A secondary to LOB. Lastly, patient performed Lt weight shift with Rt step fwd/back with intermittent mod A secondary to LOB and multimodal cueing required for appropriate  form. Though patient required mod A for one instance of LOB, patient did perform an appropriate Lt stepping reaction during LOB. Patient would benefit from continued focus on static stability activities.  Patient performed Lt weight shift with Rt fwd/back step 1x5 with CGA with mirror use for visual feedback and bilat UE use for stability. Patient attempted to perform without UE use with high levels of instability.   Patient ambulated in parallel bars 1x with bilat UE use using mirror for visual feedback. Patient then decreased UE use to Rt UE only to simulate cane 1x3 down and back.  Patient ambulated fwd/back 1x2 down and back in parallel bars with bilat UE and CGA. Patient attempted to perform with unilat UE and without UE use, though showed high increases in instability. PT discussed change in  balance strategies and proprioception following amputation.     TODAY'S TREATMENT:                                                                                                                             DATE:  01/12/2024: Prosthetic Care with left Transtibial  prosthesis and right blue rocker AFO: Residual limb has no open areas.  Some redness over tibial crest and patella. PT recommended increasing wear to 4 hrs on, 2 hrs off, 4hrs on, 2 hrs off, then 3rd time until bedtime. PT reviewed adjusting ply socks. Pt verbalized understanding.   Pt amb 50' X 2 with RW with CGA.  Pt neg 12* ramp and 6.5 curb with RW with minA and cues on technique.    Neuromuscular Re-education: HEP at sink working on proprioception with residual limb, balance and weight shifts.   See below for HEP.    Therapeutic Activities: Nustep seat 10 with BLEs/BUEs level 7 for 8 min.     TREATMENT:                                                                                                                             DATE:  01/07/2024: Prosthetic Care with left Transtibial  prosthesis and right blue rocker AFO: PT verbally and demo educated patient on donning and use of right blue rocker AFO.  Patient verbalized understanding. Patient ambulated 40' x 2 with RW with minA.  He had significantly improved foot clearance of right foot with use of the AFO.  Patient also had less balance issues requiring increased assistance to prevent falls. PT  demo and verbal cues on negotiating curb with RW, TTA prosthesis and AFO.  Patient negotiated 6.5 curb with modA.   PT demo and verbal cues on proper sit to/from stand technique.  Patient able to arise from 18 inch chair without armrests using BUEs on seat with minA for balance. Patient ambulated 200' with RW with minA with visual and verbal cues for for proper step width, upright posture and weight shift over stance lower extremity.   HOME EXERCISE PROGRAM: Do each exercise 1-2   times per day Do each exercise 5-10 repetitions Hold each exercise for 2 seconds to feel your location  AT SINK FIND YOUR MIDLINE POSITION AND PLACE FEET EQUAL DISTANCE FROM THE MIDLINE.  Try to find this position when standing still for activities.   USE TAPE ON FLOOR TO MARK THE MIDLINE POSITION which is even with middle of sink.  You also should try to feel with your limb pressure in socket.  You are trying to feel with limb what you used to feel with the bottom of your foot.  Side to Side Shift: Moving your hips only (not shoulders): move weight onto your left leg, HOLD/FEEL pressure in socket.  Move back to equal weight on each leg, HOLD/FEEL pressure in socket. Move weight onto your right leg, HOLD/FEEL pressure in socket. Move back to equal weight on each leg, HOLD/FEEL pressure in socket. Repeat.  Start with both hands on sink, progress to hand on prosthetic side only Front to Back Shift: Moving your hips only (not shoulders): move your weight forward onto your toes, HOLD/FEEL pressure in socket. Move your weight back to equal Flat Foot on both legs, HOLD/FEEL  pressure in socket. Move your weight back onto your heels, HOLD/FEEL  pressure in socket. Move your weight back to equal on both legs, HOLD/FEEL  pressure in socket. Repeat.  Start with both hands on sink, progress to hand on prosthetic side only Moving Cones / Cups: With equal weight on each leg: Hold on with one hand the first time, then progress to no hand supports. Move cups from one side of sink to the other. Place cups ~2" out of your reach, progress to 10" beyond reach.  Place one hand in middle of sink and reach with other hand. Do both arms.  Overhead/Upward Reaching: alternated reaching up to top cabinets or ceiling if no cabinets present. Keep equal weight on each leg. Start with one hand support on counter while other hand reaches and progress to no hand support with reaching.  ace one hand in middle of sink and reach with  other hand. Do both arms.  5.   Looking Over Shoulders: With equal weight on each leg: alternate turning to look over your shoulders with one hand support on counter as needed.  Start with head motions only to look in front of shoulder, then even with shoulder and progress to looking behind you. To look to side, move head /eyes, then shoulder on side looking pulls back, shift more weight to side looking and pull hip back. Place one hand in middle of sink and let go with other hand so your shoulder can pull back. Switch hands to look other way.   6.  Stepping into lower cabinet:  Move items under cabinet out of your way. Shift your hips/pelvis so weight on prosthesis. Tighten muscles in hip on prosthetic side.  SLOWLY step other leg so front of foot is in cabinet. Then step back to floor. Repeat with  other leg.    ASSESSMENT:  CLINICAL IMPRESSION: Patient improved knee control ascending ramp with PT instruction and repetition.  Patient continues to have balance deficits placing him at high fall risk.  Patient improved balance reactions with Neuromuscular Re-education activities but needs assist to prevent fall.    OBJECTIVE IMPAIRMENTS: Abnormal gait, decreased activity tolerance, decreased balance, decreased endurance, decreased knowledge of condition, decreased knowledge of use of DME, decreased mobility, difficulty walking, decreased strength, increased edema, postural dysfunction, prosthetic dependency , and impaired skin integrity.   ACTIVITY LIMITATIONS: carrying, lifting, bending, sitting, standing, stairs, transfers, reach over head, and locomotion level  PARTICIPATION LIMITATIONS: meal prep, driving, shopping, community activity, and yard work  PERSONAL FACTORS: Fitness, Time since onset of injury/illness/exacerbation, and 3+ comorbidities: see PMH are also affecting patient's functional outcome.   REHAB POTENTIAL: Good  CLINICAL DECISION MAKING: Evolving/moderate complexity  EVALUATION  COMPLEXITY: Moderate   GOALS: Goals reviewed with patient? Yes  SHORT TERM GOALS: Target date: 01/29/2024  Patient donnes prosthesis modified independent & verbalizes proper cleaning. Baseline: SEE OBJECTIVE DATA Goal status: Ongoing   01/19/2024 2.  Patient tolerates prosthesis >10 hrs total /day without skin issues or limb pain >5/10 after standing. Baseline: SEE OBJECTIVE DATA Goal status: Ongoing   01/19/2024  3.  Patient able to reach 7, pick up object from floor and look over both shoulders with RW support with supervision. Baseline: SEE OBJECTIVE DATA Goal status: Ongoing   01/19/2024  4. Patient ambulates 41' with RW & prosthesis with supervision. Baseline: SEE OBJECTIVE DATA Goal status: Ongoing   01/19/2024  5. Patient negotiates ramps & curbs with RW & prosthesis with minA. Baseline: SEE OBJECTIVE DATA Goal status: Ongoing   01/19/2024  [place] TERM GOALS: Target date: 03/25/2024  Patient demonstrates & verbalized understanding of prosthetic care to enable safe utilization of prosthesis. Baseline: SEE OBJECTIVE DATA Goal status: Ongoing   01/19/2024  Patient tolerates prosthesis wear >90% of awake hours without skin or limb pain issues. Baseline: SEE OBJECTIVE DATA Goal status: Ongoing   01/19/2024  Berg Balance >30/56 to indicate lower fall risk Baseline: SEE OBJECTIVE DATA Goal status: Ongoing   01/19/2024  Patient ambulates >300' with prosthesis & LRAD independently Baseline: SEE OBJECTIVE DATA Goal status: Ongoing   01/19/2024  Patient negotiates ramps, curbs & stairs with single rail with prosthesis & LRAD independently. Baseline: SEE OBJECTIVE DATA Goal status: Ongoing   01/19/2024  Patient reports Patient-Specific Activity Score average to >/= 5 to indicate improvement in functional activities.   Baseline: SEE OBJECTIVE DATA Goal status: Ongoing   01/19/2024  PLAN:  PT FREQUENCY: 2x/week  PT DURATION: 12 weeks  PLANNED INTERVENTIONS: [id number]-  PT Re-evaluation, 97750- Physical Performance Testing, 97110-Therapeutic exercises, 97530- Therapeutic activity, [id number]- Neuromuscular re-education, [phone]- Self Care, [id number]- Gait training, [phone]- Prosthetic Initial , [phone]- Orthotic/Prosthetic subsequent, Patient/Family education, Balance training, Stair training, Scar mobilization, Vestibular training, and DME instructions  PLAN FOR NEXT SESSION:   Continue prosthetic gait with RW, prosthesis and AFO.  Standing balance activities.     Grayce Spatz, PT, DPT 01/19/2024, 8:44 PM

## 2024-01-21 ENCOUNTER — Ambulatory Visit (INDEPENDENT_AMBULATORY_CARE_PROVIDER_SITE_OTHER): Admitting: Physical Therapy

## 2024-01-21 ENCOUNTER — Encounter: Payer: Self-pay | Admitting: Physical Therapy

## 2024-01-21 DIAGNOSIS — M6281 Muscle weakness (generalized): Secondary | ICD-10-CM | POA: Diagnosis not present

## 2024-01-21 DIAGNOSIS — M21371 Foot drop, right foot: Secondary | ICD-10-CM | POA: Diagnosis not present

## 2024-01-21 DIAGNOSIS — L98498 Non-pressure chronic ulcer of skin of other sites with other specified severity: Secondary | ICD-10-CM

## 2024-01-21 DIAGNOSIS — R2681 Unsteadiness on feet: Secondary | ICD-10-CM | POA: Diagnosis not present

## 2024-01-21 DIAGNOSIS — R2689 Other abnormalities of gait and mobility: Secondary | ICD-10-CM | POA: Diagnosis not present

## 2024-01-21 NOTE — Therapy (Signed)
 OUTPATIENT PHYSICAL THERAPY PROSTHETIC TREATMENT   Patient Name: Scott Navarro. MRN: [id number] DOB:1965/12/10, 58 y.o., male Today's Date: 01/21/2024  END OF SESSION:  PT End of Session - 01/21/24 0850     Visit Number 7    Number of Visits 25    Date for Recertification  03/25/24    Authorization Type [employer] other    Authorization - Visit Number 7    Authorization - Number of Visits 90    PT Start Time 0848    PT Stop Time 0929    PT Time Calculation (min) 41 min    Equipment Utilized During Treatment Gait belt    Activity Tolerance Patient tolerated treatment well    Behavior During Therapy WFL for tasks assessed/performed          Past Medical History:  Diagnosis Date   Type II diabetes mellitus (HCC) dx'd 10/08/2016   Past Surgical History:  Procedure Laterality Date   AMPUTATION Right 10/09/2016   Procedure: INCISION AND DRAINAGE RIGHT GREAT TOE;  Surgeon: Harden Jerona GAILS, MD;  Location: [facility] OR;  Service: Orthopedics;  Laterality: Right;   AMPUTATION Left 07/23/2023   Procedure: AMPUTATION, BELOW KNEE, LEFT;  Surgeon: Harden Jerona GAILS, MD;  Location: [facility] OR;  Service: Orthopedics;  Laterality: Left;  IRRIGATION AND DEBRIDEMENT AND AMPUTATION OF LEFT LEG   IRRIGATION AND DEBRIDEMENT KNEE Left 07/25/2023   Procedure: IRRIGATION AND DEBRIDEMENT LEFT KNEE;  Surgeon: Harden Jerona GAILS, MD;  Location: [facility] OR;  Service: Orthopedics;  Laterality: Left;  DEBRIDEMENT LEFT LEG   Patient Active Problem List   Diagnosis Date Noted   Coping style affecting medical condition 08/05/2023   S/P BKA (below knee amputation), left (HCC) 07/30/2023   ARF (acute renal failure) 07/23/2023   Hyperlipidemia 07/23/2023   Amputation below knee (HCC) 07/23/2023   Necrotizing fasciitis (HCC) 07/22/2023   Anemia 07/22/2023   Hyponatremia 07/22/2023   Diabetic retinopathy associated with type 2 diabetes mellitus (HCC) 02/18/2023   Charcot joint of left foot 02/18/2023   Encounter for lipid screening  for cardiovascular disease 02/18/2023   Screening for prostate cancer 02/18/2023   Type 2 diabetes mellitus with hyperglycemia (HCC) 11/28/2022   Gait instability 11/28/2022   Retinal hemorrhage 11/21/2022   Glaucoma 11/21/2022   Achilles tendon contracture, right 11/21/2016   Hypokalemia    Class 1 obesity due to excess calories with body mass index (BMI) of 33.0 to 33.9 in adult    Wound infection 10/09/2016   Hyperglycemia 10/09/2016   Elevated blood pressure reading 10/09/2016   Diabetic polyneuropathy associated with type 2 diabetes mellitus (HCC)    Laceration w FB of right great toe w/o damage to nail, init     PCP: Bulah Alm RAMAN, PA-C  REFERRING PROVIDER: Maurilio CHRISTELLA Collet, PA-C  ONSET DATE:  12/26/2023 prosthesis delivery  REFERRING DIAG: S10.487 (ICD-10-CM) - S/P BKA (below knee amputation), left   THERAPY DIAG:  Other abnormalities of gait and mobility  Unsteadiness on feet  Muscle weakness (generalized)  Foot drop, right  Non-pressure chronic ulcer of skin of other sites with other specified severity [facility])  Rationale for Evaluation and Treatment: Rehabilitation  SUBJECTIVE:   SUBJECTIVE STATEMENT: He was able to help disabled son get to/from doctor visit at [facility] including hoyer lift transfer.  He is wearing prosthesis 4 hrs on 2 hrs off with 3rd wear until bedtime.   Pt accompanied by: family member  PERTINENT HISTORY: left TTA 07/23/23, DM2, polyneuropathy, Acute renal  failure, HLD  PAIN:  Are you having pain? No  PRECAUTIONS: Fall  RED FLAGS: None   WEIGHT BEARING RESTRICTIONS: No  FALLS: Has patient fallen in last 6 months? No  LIVING ENVIRONMENT: Lives with: lives with spouse and mother with Alzheimer's. He is currently living with son who had recent medical issue making him bed / wheelchair bound.  His wife & he are son's caregivers but think is not permanent situation.   Lives in: House his house is 2 story development worker, community / bedroom main  level) and son's house is single level (where they are currently staying).  Home Access: 3 steps to main entrance to his home and son's house has ramp.  Stairs: Yes: Internal: 14 steps; on right going up and External: 3 steps; on left going up Has following equipment at home: Single point cane, Walker - 2 wheeled, Wheelchair (manual), Shower bench, and bed side commode  PLOF: Independent, Independent with household mobility without device, and Independent with community mobility without device  Occupation: retired  PATIENT GOALS: to use prosthesis normal activities: driving, shopping, yard work,  active at place in [employer],   OBJECTIVE:  Patient-Specific Activity Scoring Scheme  0 represents "unable to perform." 10 represents "able to perform at prior level. 0 1 2 3 4 5 6 7 8 9  10 (Date and Score)   Activity Eval 12/29/23    1. Care & wear of prosthesis  1    2. Standing ADLs   2    3. Walking  2   4. Outdoor activities 0   5.    Score 1.25    Total score = sum of the activity scores/number of activities Minimum detectable change (90%CI) for average score = 2 points Minimum detectable change (90%CI) for single activity score = 3 points  COGNITION: Evaluation on 12/29/2023: Overall cognitive status: Within functional limits for tasks assessed   SENSATION: Evaluation on 12/29/2023: [facility]  CARDIOVASCULAR RESPONSE: Evaluation on 12/29/2023: Functional activity: standing balance & gait Post-activity vitals: HR: 116 SpO2: 100% Modified Borg scale for dyspnea: 2: mild shortness of breath  POSTURE:  Evaluation on 12/29/2023: rounded shoulders, forward head, flexed trunk , and weight shift right  LOWER EXTREMITY ROM:  ROM P:passive  A:active Right Eval 12/29/23 Left Eval 12/29/23  Hip flexion    Hip extension    Hip abduction    Hip adduction    Hip internal rotation    Hip external rotation    Knee flexion    Knee extension  0*  Ankle  dorsiflexion    Ankle plantarflexion    Ankle inversion    Ankle eversion     (Blank rows = not tested)  LOWER EXTREMITY MMT:  MMT Right Eval 12/29/23 Left Eval 12/2723  Hip flexion    Hip extension 3/5   Hip abduction 3/5   Hip adduction    Hip internal rotation    Hip external rotation    Knee flexion 3/5   Knee extension 3/5 4/5  Ankle dorsiflexion  2-/5  Ankle plantarflexion    Ankle inversion    Ankle eversion    At Evaluation all strength testing is grossly seated and functionally standing / gait. (Blank rows = not tested)  TRANSFERS: Evaluation on 12/29/2023: Sit to stand: SBA from 22 w/c requires use of armrests and back of legs against chair &/or RW for stabilization. Stand to sit: SBA RW to 22 w/c requires use of armrests and back of legs against  chair &/or RW for stabilization.  FUNCTIONAL TESTs:  Evaluation on 12/29/2023: Lars Balance 12/56  [facility] PT Assessment - 12/29/23 0900       Standardized Balance Assessment   Standardized Balance Assessment Berg Balance Test      Berg Balance Test   Sit to Stand Needs minimal aid to stand or to stabilize    Standing Unsupported Needs several tries to stand 30 seconds unsupported    Sitting with Back Unsupported but Feet Supported on Floor or Stool Able to sit safely and securely 2 minutes    Stand to Sit Controls descent by using hands    Transfers Able to transfer safely, definite need of hands    Standing Unsupported with Eyes Closed Needs help to keep from falling    Standing Unsupported with Feet Together Needs help to attain position and unable to hold for 15 seconds    From Standing, Reach Forward with Outstretched Arm Loses balance while trying/requires external support    From Standing Position, Pick up Object from Floor Unable to try/needs assist to keep balance    From Standing Position, Turn to Look Behind Over each Shoulder Needs assist to keep from losing balance and falling    Turn 360 Degrees  Needs assistance while turning    Standing Unsupported, Alternately Place Feet on Step/Stool Needs assistance to keep from falling or unable to try    Standing Unsupported, One Foot in [employer] balance while stepping or standing    Standing on One Leg Unable to try or needs assist to prevent fall    Total Score 12    Berg comment: < 36 high risk for falls (close to 100%) 46-51 moderate (>50%)   37-45 significant (>80%) 52-55 lower (> 25%)          GAIT: Evaluation on 12/29/2023: Distance walked: 60' Assistive device utilized: [employer] - 2 wheeled and TTA prosthesis Level of assistance: Min A (multiple balance losses requiring PT assist to stabilize especially in turns) Gait pattern: step through pattern, decreased step length- Right, decreased stance time- Left, decreased hip/knee flexion- Left, decreased ankle dorsiflexion- Right, Right hip hike, Left hip hike, genu recurvatum- Right, antalgic, trendelenburg, lateral hip instability, trunk flexed, and poor foot clearance- Right, left knee instability / flexion moment intermittently  Gait velocity:  0.97 ft/sec Comments: excess UE weight bearing on RW.    CURRENT PROSTHETIC WEAR ASSESSMENT: Evaluation on 12/29/2023:  Patient is dependent with: skin check, residual limb care, care of non-amputated limb, prosthetic cleaning, ply sock cleaning, correct ply sock adjustment, proper wear schedule/adjustment, and proper weight-bearing schedule/adjustment Donning prosthesis: SBA Doffing prosthesis: SBA Prosthetic wear tolerance: up to 1.5 hours, 2x/day, 2 of 3 days since delivery Prosthetic weight bearing tolerance: 5 minutes with no c/o residual limb pain Edema: pitting Residual limb condition: cylindrical shape, 3 cm superficial scab at anterior-lateral limb with no signs of infection, small wound on lateral limb proximal to fibular head with white covering 0.4 cm, invaginated scar lateral aspect, dry flaking skin, no hair growth, redness over  patella (friction from liner when seated), normal temperature.  Prosthetic description: silicon (9 mm anterior portion) with pin lock suspension, total contact socket with flexible inner socket.    TODAY'S TREATMENT:  DATE:  01/21/2024: Prosthetic Care with left Transtibial  prosthesis and right blue rocker AFO: Patient ambulated 140' & 100' with RW and CGA to SBA with verbal & visual cues for step width and upright posture.   Pt neg ramp with RW with minA with PT verbal cues on technique especially ascending to limit poor knee control.  Pt neg curb with RW with minA with cues on technique.  Residual limb has no opening but skin chafing distal portion.  PT reviewed adjusting ply socks as too few can be cause of distal pressure.  PT recommended increasing wear from arising to bedtime except 2 hours mid-day. Pat Dry limb/liner half way of both wears.  PT educated on using prosthesis & AFO to enter/exit bathroom.  Pt verbalized understanding.    Therapeutic Activities: PT demo & verbal cues on using bar stool for standing ADLs including sit to/from stand.  Pt able to sit / stand on 24 stool safely.  Pt able to sit on 24 or 29 stool with feet on floor and buttocks on seat.  PT educated on performing ADLS so BUE free and UEs / vision closer to standing.  Pt verbalized understanding.   Leg press BLEs 81# 10 reps 2 sets;  single LE RLE 43# 10 reps and LLE 37# 10 reps.   Standing back extension with CGA - forward lean placing hands on chair and upright 5 sec for 10 reps.      TREATMENT:                                                                                                                             DATE:  01/19/2024: Prosthetic Care with left Transtibial  prosthesis and right blue rocker AFO: Patient ambulated 140' & 60 with RW and CGA with verbal & visual cues for  step width and upright posture.   Pt neg ramp with RW with minA 4 reps with PT demo & verbal cues on technique especially ascending to limit poor knee control.  Pt neg curb with RW with minA with cues on technique.   Neuro Re-Ed:  Standing balance to facilitate core stabilization and balance reactions.  UE resistance with green theraband single UEs and BUEs 10 reps each rows, forward reach and biceps curl.  Mod-MinA for balance reactions.   Standing with BUE support with slider forward and backward motions with focus on stance LE / knee stability 10 reps with each LE.   Standing LE resistance with green theraband figure 8 above knee level abduction and extension 10 reps ea with BUE support.  PT cues on upright posture.   Stepping LE stance onto Airex mat and contralateral LE stepping from floor posteriorly to tapping knee on counter anteriorly 10 reps with ea LE.  BUE support on sink & minA / tactile cues for balance reactions.      TREATMENT:  DATE:  01/16/2024: Prosthetic Care with left Transtibial  prosthesis and right blue rocker AFO: PT discussed appropriate ply sock wear at beginning of session and assessed patient's Lt LE within prosthesis at end of session and discussed requirement for adding socks for the remaining of the day.  PT discussed and educated on the physical and mental challenges following amputation.  PT educated patient on the progression from RW to no AD (RW to bilat canes to unilat cane to no AD). PT demonstrated 4-point ambulation with bilat canes using SPC with stand alone tips while verbalizing 1-2-3-4 and discussing appropriate placement of canes to assist with appropriate step length and foot placement. Patient then attempted to perform ambulation with this technique within parallel bars 1x down and back with close CGA from PT. Patient highly  unstable with this technique.  Patient ambulated with RW and CGA for 89.5' and 67.5' with narrow BOS, ER of bilat LEs, and increased hip flexion of Rt LE with Rt swing.   Neuro Re-Ed:  Patient performed parts of HEP at sink with CGA-mod A for review and to increase weightbearing, weight shifting, and stability. Patient performed fwd/back weight shifts 1x10 slowly and with intermittent requirement for UE use on sink and up to min A from PT secondary to fwd and posterior LOB.  Then patient performed lateral weight shifts 1x10 slowly and with intermittent UE use and min A secondary to LOB. Lastly, patient performed Lt weight shift with Rt step fwd/back with intermittent mod A secondary to LOB and multimodal cueing required for appropriate form. Though patient required mod A for one instance of LOB, patient did perform an appropriate Lt stepping reaction during LOB. Patient would benefit from continued focus on static stability activities.  Patient performed Lt weight shift with Rt fwd/back step 1x5 with CGA with mirror use for visual feedback and bilat UE use for stability. Patient attempted to perform without UE use with high levels of instability.   Patient ambulated in parallel bars 1x with bilat UE use using mirror for visual feedback. Patient then decreased UE use to Rt UE only to simulate cane 1x3 down and back.  Patient ambulated fwd/back 1x2 down and back in parallel bars with bilat UE and CGA. Patient attempted to perform with unilat UE and without UE use, though showed high increases in instability. PT discussed change in balance strategies and proprioception following amputation.     TREATMENT:                                                                                                                             DATE:  01/12/2024: Prosthetic Care with left Transtibial  prosthesis and right blue rocker AFO: Residual limb has no open areas.  Some redness over tibial crest and patella. PT  recommended increasing wear to 4 hrs on, 2 hrs off, 4hrs on, 2 hrs off, then 3rd time until bedtime. PT reviewed adjusting ply socks. Pt verbalized understanding.  Pt amb 50' X 2 with RW with CGA.  Pt neg 12* ramp and 6.5 curb with RW with minA and cues on technique.    Neuromuscular Re-education: HEP at sink working on proprioception with residual limb, balance and weight shifts.   See below for HEP.    Therapeutic Activities: Nustep seat 10 with BLEs/BUEs level 7 for 8 min.      HOME EXERCISE PROGRAM: Do each exercise 1-2  times per day Do each exercise 5-10 repetitions Hold each exercise for 2 seconds to feel your location  AT SINK FIND YOUR MIDLINE POSITION AND PLACE FEET EQUAL DISTANCE FROM THE MIDLINE.  Try to find this position when standing still for activities.   USE TAPE ON FLOOR TO MARK THE MIDLINE POSITION which is even with middle of sink.  You also should try to feel with your limb pressure in socket.  You are trying to feel with limb what you used to feel with the bottom of your foot.  Side to Side Shift: Moving your hips only (not shoulders): move weight onto your left leg, HOLD/FEEL pressure in socket.  Move back to equal weight on each leg, HOLD/FEEL pressure in socket. Move weight onto your right leg, HOLD/FEEL pressure in socket. Move back to equal weight on each leg, HOLD/FEEL pressure in socket. Repeat.  Start with both hands on sink, progress to hand on prosthetic side only Front to Back Shift: Moving your hips only (not shoulders): move your weight forward onto your toes, HOLD/FEEL pressure in socket. Move your weight back to equal Flat Foot on both legs, HOLD/FEEL  pressure in socket. Move your weight back onto your heels, HOLD/FEEL  pressure in socket. Move your weight back to equal on both legs, HOLD/FEEL  pressure in socket. Repeat.  Start with both hands on sink, progress to hand on prosthetic side only Moving Cones / Cups: With equal weight on each leg: Hold  on with one hand the first time, then progress to no hand supports. Move cups from one side of sink to the other. Place cups ~2" out of your reach, progress to 10" beyond reach.  Place one hand in middle of sink and reach with other hand. Do both arms.  Overhead/Upward Reaching: alternated reaching up to top cabinets or ceiling if no cabinets present. Keep equal weight on each leg. Start with one hand support on counter while other hand reaches and progress to no hand support with reaching.  ace one hand in middle of sink and reach with other hand. Do both arms.  5.   Looking Over Shoulders: With equal weight on each leg: alternate turning to look over your shoulders with one hand support on counter as needed.  Start with head motions only to look in front of shoulder, then even with shoulder and progress to looking behind you. To look to side, move head /eyes, then shoulder on side looking pulls back, shift more weight to side looking and pull hip back. Place one hand in middle of sink and let go with other hand so your shoulder can pull back. Switch hands to look other way.   6.  Stepping into lower cabinet:  Move items under cabinet out of your way. Shift your hips/pelvis so weight on prosthesis. Tighten muscles in hip on prosthetic side.  SLOWLY step other leg so front of foot is in cabinet. Then step back to floor. Repeat with other leg.    ASSESSMENT:  CLINICAL IMPRESSION: Patient appears to  understand using bar stool for ADLs that he would typically stand to perform.  Pt required less assistance for gait with RW today.  Patient continues to benefit from skilled PT.    OBJECTIVE IMPAIRMENTS: Abnormal gait, decreased activity tolerance, decreased balance, decreased endurance, decreased knowledge of condition, decreased knowledge of use of DME, decreased mobility, difficulty walking, decreased strength, increased edema, postural dysfunction, prosthetic dependency , and impaired skin integrity.    ACTIVITY LIMITATIONS: carrying, lifting, bending, sitting, standing, stairs, transfers, reach over head, and locomotion level  PARTICIPATION LIMITATIONS: meal prep, driving, shopping, community activity, and yard work  PERSONAL FACTORS: Fitness, Time since onset of injury/illness/exacerbation, and 3+ comorbidities: see PMH are also affecting patient's functional outcome.   REHAB POTENTIAL: Good  CLINICAL DECISION MAKING: Evolving/moderate complexity  EVALUATION COMPLEXITY: Moderate   GOALS: Goals reviewed with patient? Yes  SHORT TERM GOALS: Target date: 01/28/2024  Patient donnes prosthesis modified independent & verbalizes proper cleaning. Baseline: SEE OBJECTIVE DATA Goal status: Ongoing   01/19/2024 2.  Patient tolerates prosthesis >10 hrs total /day without skin issues or limb pain >5/10 after standing. Baseline: SEE OBJECTIVE DATA Goal status: Ongoing   01/19/2024  3.  Patient able to reach 7, pick up object from floor and look over both shoulders with RW support with supervision. Baseline: SEE OBJECTIVE DATA Goal status: Ongoing   01/19/2024  4. Patient ambulates 69' with RW & prosthesis with supervision. Baseline: SEE OBJECTIVE DATA Goal status: Ongoing   01/19/2024  5. Patient negotiates ramps & curbs with RW & prosthesis with minA. Baseline: SEE OBJECTIVE DATA Goal status: Ongoing   01/19/2024  [place] TERM GOALS: Target date: 03/25/2024  Patient demonstrates & verbalized understanding of prosthetic care to enable safe utilization of prosthesis. Baseline: SEE OBJECTIVE DATA Goal status: Ongoing   01/19/2024  Patient tolerates prosthesis wear >90% of awake hours without skin or limb pain issues. Baseline: SEE OBJECTIVE DATA Goal status: Ongoing   01/19/2024  Berg Balance >30/56 to indicate lower fall risk Baseline: SEE OBJECTIVE DATA Goal status: Ongoing   01/19/2024  Patient ambulates >300' with prosthesis & LRAD independently Baseline: SEE OBJECTIVE  DATA Goal status: Ongoing   01/19/2024  Patient negotiates ramps, curbs & stairs with single rail with prosthesis & LRAD independently. Baseline: SEE OBJECTIVE DATA Goal status: Ongoing   01/19/2024  Patient reports Patient-Specific Activity Score average to >/= 5 to indicate improvement in functional activities.   Baseline: SEE OBJECTIVE DATA Goal status: Ongoing   01/19/2024  PLAN:  PT FREQUENCY: 2x/week  PT DURATION: 12 weeks  PLANNED INTERVENTIONS: 97164- PT Re-evaluation, 97750- Physical Performance Testing, 97110-Therapeutic exercises, 97530- Therapeutic activity, 97112- Neuromuscular re-education, [phone]- Self Care, [id number]- Gait training, [phone]- Prosthetic Initial , [phone]- Orthotic/Prosthetic subsequent, Patient/Family education, Balance training, Stair training, Scar mobilization, Vestibular training, and DME instructions  PLAN FOR NEXT SESSION:  check if he tried bar stool for ADLs,  leg press,  Continue prosthetic gait with RW, prosthesis and AFO.  Standing balance activities.     Grayce Spatz, PT, DPT 01/21/2024, 12:38 PM

## 2024-01-23 NOTE — Therapy (Signed)
 OUTPATIENT PHYSICAL THERAPY PROSTHETIC TREATMENT   Patient Name: Scott Navarro. MRN: [id number] DOB:[birth year], 58 y.o., male Today's Date: 01/26/2024  END OF SESSION:  PT End of Session - 01/26/24 1148     Visit Number 8    Number of Visits 25    Date for Recertification  03/25/24    Authorization Type [employer] other    Authorization - Visit Number 8    Authorization - Number of Visits 90    PT Start Time 1148    PT Stop Time 1229    PT Time Calculation (min) 41 min    Equipment Utilized During Treatment Gait belt    Activity Tolerance Patient tolerated treatment well    Behavior During Therapy WFL for tasks assessed/performed           Past Medical History:  Diagnosis Date   Type II diabetes mellitus (HCC) dx'd 10/08/2016   Past Surgical History:  Procedure Laterality Date   AMPUTATION Right 10/09/2016   Procedure: INCISION AND DRAINAGE RIGHT GREAT TOE;  Surgeon: Harden Jerona GAILS, MD;  Location: [facility] OR;  Service: Orthopedics;  Laterality: Right;   AMPUTATION Left 07/23/2023   Procedure: AMPUTATION, BELOW KNEE, LEFT;  Surgeon: Harden Jerona GAILS, MD;  Location: [facility] OR;  Service: Orthopedics;  Laterality: Left;  IRRIGATION AND DEBRIDEMENT AND AMPUTATION OF LEFT LEG   IRRIGATION AND DEBRIDEMENT KNEE Left 07/25/2023   Procedure: IRRIGATION AND DEBRIDEMENT LEFT KNEE;  Surgeon: Harden Jerona GAILS, MD;  Location: [facility] OR;  Service: Orthopedics;  Laterality: Left;  DEBRIDEMENT LEFT LEG   Patient Active Problem List   Diagnosis Date Noted   Coping style affecting medical condition 08/05/2023   S/P BKA (below knee amputation), left (HCC) 07/30/2023   ARF (acute renal failure) 07/23/2023   Hyperlipidemia 07/23/2023   Amputation below knee (HCC) 07/23/2023   Necrotizing fasciitis (HCC) 07/22/2023   Anemia 07/22/2023   Hyponatremia 07/22/2023   Diabetic retinopathy associated with type 2 diabetes mellitus (HCC) 02/18/2023   Charcot joint of left foot 02/18/2023   Encounter for lipid  screening for cardiovascular disease 02/18/2023   Screening for prostate cancer 02/18/2023   Type 2 diabetes mellitus with hyperglycemia (HCC) 11/28/2022   Gait instability 11/28/2022   Retinal hemorrhage 11/21/2022   Glaucoma 11/21/2022   Achilles tendon contracture, right 11/21/2016   Hypokalemia    Class 1 obesity due to excess calories with body mass index (BMI) of 33.0 to 33.9 in adult    Wound infection 10/09/2016   Hyperglycemia 10/09/2016   Elevated blood pressure reading 10/09/2016   Diabetic polyneuropathy associated with type 2 diabetes mellitus (HCC)    Laceration w FB of right great toe w/o damage to nail, init     PCP: Bulah Alm RAMAN, PA-C  REFERRING PROVIDER: Maurilio CHRISTELLA Collet, PA-C  ONSET DATE:  12/26/2023 prosthesis delivery  REFERRING DIAG: S10.487 (ICD-10-CM) - S/P BKA (below knee amputation), left   THERAPY DIAG:  Other abnormalities of gait and mobility  Unsteadiness on feet  Muscle weakness (generalized)  Foot drop, right  Non-pressure chronic ulcer of skin of other sites with other specified severity [facility])  Rationale for Evaluation and Treatment: Rehabilitation  SUBJECTIVE:   SUBJECTIVE STATEMENT: Patient reports that he is going to the prosthetist on Wednesday and they will be replacing the ankle with the articulating ankle. Patient has been able to wear prosthesis for 14 hours a day max.  Pt accompanied by: family member  PERTINENT HISTORY: left TTA 07/23/23, DM2, polyneuropathy,  Acute renal failure, HLD  PAIN:  Are you having pain? No  PRECAUTIONS: Fall  RED FLAGS: None   WEIGHT BEARING RESTRICTIONS: No  FALLS: Has patient fallen in last 6 months? No  LIVING ENVIRONMENT: Lives with: lives with spouse and mother with Alzheimer's. He is currently living with son who had recent medical issue making him bed / wheelchair bound.  His wife & he are son's caregivers but think is not permanent situation.   Lives in: House his house is 2 story  development worker, community / bedroom main level) and son's house is single level (where they are currently staying).  Home Access: 3 steps to main entrance to his home and son's house has ramp.  Stairs: Yes: Internal: 14 steps; on right going up and External: 3 steps; on left going up Has following equipment at home: Single point cane, Walker - 2 wheeled, Wheelchair (manual), Shower bench, and bed side commode  PLOF: Independent, Independent with household mobility without device, and Independent with community mobility without device  Occupation: retired  PATIENT GOALS: to use prosthesis normal activities: driving, shopping, yard work,  active at place in [employer],   OBJECTIVE:  Patient-Specific Activity Scoring Scheme  0 represents "unable to perform." 10 represents "able to perform at prior level. 0 1 2 3 4 5 6 7 8 9  10 (Date and Score)   Activity Eval 12/29/23    1. Care & wear of prosthesis  1    2. Standing ADLs   2    3. Walking  2   4. Outdoor activities 0   5.    Score 1.25    Total score = sum of the activity scores/number of activities Minimum detectable change (90%CI) for average score = 2 points Minimum detectable change (90%CI) for single activity score = 3 points  COGNITION: Evaluation on 12/29/2023: Overall cognitive status: Within functional limits for tasks assessed   SENSATION: Evaluation on 12/29/2023: [facility]  CARDIOVASCULAR RESPONSE: Evaluation on 12/29/2023: Functional activity: standing balance & gait Post-activity vitals: HR: 116 SpO2: 100% Modified Borg scale for dyspnea: 2: mild shortness of breath  POSTURE:  Evaluation on 12/29/2023: rounded shoulders, forward head, flexed trunk , and weight shift right  LOWER EXTREMITY ROM:  ROM P:passive  A:active Right Eval 12/29/23 Left Eval 12/29/23  Hip flexion    Hip extension    Hip abduction    Hip adduction    Hip internal rotation    Hip external rotation    Knee flexion    Knee  extension  0*  Ankle dorsiflexion    Ankle plantarflexion    Ankle inversion    Ankle eversion     (Blank rows = not tested)  LOWER EXTREMITY MMT:  MMT Right Eval 12/29/23 Left Eval 12/2723  Hip flexion    Hip extension 3/5   Hip abduction 3/5   Hip adduction    Hip internal rotation    Hip external rotation    Knee flexion 3/5   Knee extension 3/5 4/5  Ankle dorsiflexion  2-/5  Ankle plantarflexion    Ankle inversion    Ankle eversion    At Evaluation all strength testing is grossly seated and functionally standing / gait. (Blank rows = not tested)  TRANSFERS: Evaluation on 12/29/2023: Sit to stand: SBA from 22 w/c requires use of armrests and back of legs against chair &/or RW for stabilization. Stand to sit: SBA RW to 22 w/c requires use of armrests and back of  legs against chair &/or RW for stabilization.  FUNCTIONAL TESTs:  Evaluation on 12/29/2023: Lars Balance 12/56  [facility] PT Assessment - 12/29/23 0900       Standardized Balance Assessment   Standardized Balance Assessment Berg Balance Test      Berg Balance Test   Sit to Stand Needs minimal aid to stand or to stabilize    Standing Unsupported Needs several tries to stand 30 seconds unsupported    Sitting with Back Unsupported but Feet Supported on Floor or Stool Able to sit safely and securely 2 minutes    Stand to Sit Controls descent by using hands    Transfers Able to transfer safely, definite need of hands    Standing Unsupported with Eyes Closed Needs help to keep from falling    Standing Unsupported with Feet Together Needs help to attain position and unable to hold for 15 seconds    From Standing, Reach Forward with Outstretched Arm Loses balance while trying/requires external support    From Standing Position, Pick up Object from Floor Unable to try/needs assist to keep balance    From Standing Position, Turn to Look Behind Over each Shoulder Needs assist to keep from losing balance and falling     Turn 360 Degrees Needs assistance while turning    Standing Unsupported, Alternately Place Feet on Step/Stool Needs assistance to keep from falling or unable to try    Standing Unsupported, One Foot in [employer] balance while stepping or standing    Standing on One Leg Unable to try or needs assist to prevent fall    Total Score 12    Berg comment: < 36 high risk for falls (close to 100%) 46-51 moderate (>50%)   37-45 significant (>80%) 52-55 lower (> 25%)          GAIT: Evaluation on 12/29/2023: Distance walked: 60' Assistive device utilized: [employer] - 2 wheeled and TTA prosthesis Level of assistance: Min A (multiple balance losses requiring PT assist to stabilize especially in turns) Gait pattern: step through pattern, decreased step length- Right, decreased stance time- Left, decreased hip/knee flexion- Left, decreased ankle dorsiflexion- Right, Right hip hike, Left hip hike, genu recurvatum- Right, antalgic, trendelenburg, lateral hip instability, trunk flexed, and poor foot clearance- Right, left knee instability / flexion moment intermittently  Gait velocity:  0.97 ft/sec Comments: excess UE weight bearing on RW.    CURRENT PROSTHETIC WEAR ASSESSMENT: Evaluation on 12/29/2023:  Patient is dependent with: skin check, residual limb care, care of non-amputated limb, prosthetic cleaning, ply sock cleaning, correct ply sock adjustment, proper wear schedule/adjustment, and proper weight-bearing schedule/adjustment Donning prosthesis: SBA Doffing prosthesis: SBA Prosthetic wear tolerance: up to 1.5 hours, 2x/day, 2 of 3 days since delivery Prosthetic weight bearing tolerance: 5 minutes with no c/o residual limb pain Edema: pitting Residual limb condition: cylindrical shape, 3 cm superficial scab at anterior-lateral limb with no signs of infection, small wound on lateral limb proximal to fibular head with white covering 0.4 cm, invaginated scar lateral aspect, dry flaking skin, no hair  growth, redness over patella (friction from liner when seated), normal temperature.  Prosthetic description: silicon (9 mm anterior portion) with pin lock suspension, total contact socket with flexible inner socket.    TODAY'S TREATMENT:  DATE:  01/26/2024: Prosthetic Care with left Transtibial  prosthesis and right blue rocker AFO: PT discussed importance of performing stool activities at home to increase time on prosthesis as well as increasing standing endurance.  PT discussed importance of safety when ambulating within the home to include use of RW and how wall-walking/counter surfing isn't a safe option.   Neuro Re-Ed: all performed with RW in front and MWC behind  Patient performed square balance with tape on floor. Patient originally attempted to perform without RW and CGA only, though highly unstable and requiring min A and RW to stabilize. Patient then performed 3x with RW and CGA, however, patient requiring several verbal cues for appropriate form and to decrease step size.  Patient performed static balance activities with CGA-max A.  Narrow stance balance 2x30s with intermittent increase of assist to min A and intermittent use of RW for stability with LOB Typical stance with head turns  Horizontal 1x14 with intermittent LOB requiring increase assist to min A and intermittent use of RW for stability with LOB Vertical 1x5 with large LOB requiring increased assist to mod-max A and intermittent use of RW for stability  Horizontal 1x20 with intermittent increase assist to min A, though able to mostly maintain CGA Up to center 1x10 with intermittent increase assist to min A and use of RW for stability  Down to center with intermittent increase assist to min A and use of RW for stability  Staggered stance 1x30s each side  with intermittent min-mod A with Lt LE back   mod-max A with Rt LE back for most of 30s  PT educating patient on the three balance systems (visual, proprioception, and vestibular) as well as the different balance strategies required to self-recover from any LOB.   TODAY'S TREATMENT:                                                                                                                             DATE:  01/21/2024: Prosthetic Care with left Transtibial  prosthesis and right blue rocker AFO: Patient ambulated 140' & 100' with RW and CGA to SBA with verbal & visual cues for step width and upright posture.   Pt neg ramp with RW with minA with PT verbal cues on technique especially ascending to limit poor knee control.  Pt neg curb with RW with minA with cues on technique.  Residual limb has no opening but skin chafing distal portion.  PT reviewed adjusting ply socks as too few can be cause of distal pressure.  PT recommended increasing wear from arising to bedtime except 2 hours mid-day. Pat Dry limb/liner half way of both wears.  PT educated on using prosthesis & AFO to enter/exit bathroom.  Pt verbalized understanding.    Therapeutic Activities: PT demo & verbal cues on using bar stool for standing ADLs including sit to/from stand.  Pt able to sit / stand on 24 stool safely.  Pt able to sit  on 24 or 29 stool with feet on floor and buttocks on seat.  PT educated on performing ADLS so BUE free and UEs / vision closer to standing.  Pt verbalized understanding.   Leg press BLEs 81# 10 reps 2 sets;  single LE RLE 43# 10 reps and LLE 37# 10 reps.   Standing back extension with CGA - forward lean placing hands on chair and upright 5 sec for 10 reps.      TREATMENT:                                                                                                                             DATE:  01/19/2024: Prosthetic Care with left Transtibial  prosthesis and right blue rocker AFO: Patient ambulated 140' & 60 with RW and CGA with verbal  & visual cues for step width and upright posture.   Pt neg ramp with RW with minA 4 reps with PT demo & verbal cues on technique especially ascending to limit poor knee control.  Pt neg curb with RW with minA with cues on technique.   Neuro Re-Ed:  Standing balance to facilitate core stabilization and balance reactions.  UE resistance with green theraband single UEs and BUEs 10 reps each rows, forward reach and biceps curl.  Mod-MinA for balance reactions.   Standing with BUE support with slider forward and backward motions with focus on stance LE / knee stability 10 reps with each LE.   Standing LE resistance with green theraband figure 8 above knee level abduction and extension 10 reps ea with BUE support.  PT cues on upright posture.   Stepping LE stance onto Airex mat and contralateral LE stepping from floor posteriorly to tapping knee on counter anteriorly 10 reps with ea LE.  BUE support on sink & minA / tactile cues for balance reactions.      TREATMENT:                                                                                                                             DATE:  01/16/2024: Prosthetic Care with left Transtibial  prosthesis and right blue rocker AFO: PT discussed appropriate ply sock wear at beginning of session and assessed patient's Lt LE within prosthesis at end of session and discussed requirement for adding socks for the remaining of the day.  PT discussed and educated on the physical and mental challenges following  amputation.  PT educated patient on the progression from RW to no AD (RW to bilat canes to unilat cane to no AD). PT demonstrated 4-point ambulation with bilat canes using SPC with stand alone tips while verbalizing 1-2-3-4 and discussing appropriate placement of canes to assist with appropriate step length and foot placement. Patient then attempted to perform ambulation with this technique within parallel bars 1x down and back with close CGA from PT.  Patient highly unstable with this technique.  Patient ambulated with RW and CGA for 89.5' and 67.5' with narrow BOS, ER of bilat LEs, and increased hip flexion of Rt LE with Rt swing.   Neuro Re-Ed:  Patient performed parts of HEP at sink with CGA-mod A for review and to increase weightbearing, weight shifting, and stability. Patient performed fwd/back weight shifts 1x10 slowly and with intermittent requirement for UE use on sink and up to min A from PT secondary to fwd and posterior LOB.  Then patient performed lateral weight shifts 1x10 slowly and with intermittent UE use and min A secondary to LOB. Lastly, patient performed Lt weight shift with Rt step fwd/back with intermittent mod A secondary to LOB and multimodal cueing required for appropriate form. Though patient required mod A for one instance of LOB, patient did perform an appropriate Lt stepping reaction during LOB. Patient would benefit from continued focus on static stability activities.  Patient performed Lt weight shift with Rt fwd/back step 1x5 with CGA with mirror use for visual feedback and bilat UE use for stability. Patient attempted to perform without UE use with high levels of instability.   Patient ambulated in parallel bars 1x with bilat UE use using mirror for visual feedback. Patient then decreased UE use to Rt UE only to simulate cane 1x3 down and back.  Patient ambulated fwd/back 1x2 down and back in parallel bars with bilat UE and CGA. Patient attempted to perform with unilat UE and without UE use, though showed high increases in instability. PT discussed change in balance strategies and proprioception following amputation.     TREATMENT:                                                                                                                             DATE:  01/12/2024: Prosthetic Care with left Transtibial  prosthesis and right blue rocker AFO: Residual limb has no open areas.  Some redness over tibial crest and  patella. PT recommended increasing wear to 4 hrs on, 2 hrs off, 4hrs on, 2 hrs off, then 3rd time until bedtime. PT reviewed adjusting ply socks. Pt verbalized understanding.   Pt amb 50' X 2 with RW with CGA.  Pt neg 12* ramp and 6.5 curb with RW with minA and cues on technique.    Neuromuscular Re-education: HEP at sink working on proprioception with residual limb, balance and weight shifts.   See below for HEP.    Therapeutic Activities: Nustep seat 10 with BLEs/BUEs  level 7 for 8 min.      HOME EXERCISE PROGRAM: Do each exercise 1-2  times per day Do each exercise 5-10 repetitions Hold each exercise for 2 seconds to feel your location  AT SINK FIND YOUR MIDLINE POSITION AND PLACE FEET EQUAL DISTANCE FROM THE MIDLINE.  Try to find this position when standing still for activities.   USE TAPE ON FLOOR TO MARK THE MIDLINE POSITION which is even with middle of sink.  You also should try to feel with your limb pressure in socket.  You are trying to feel with limb what you used to feel with the bottom of your foot.  Side to Side Shift: Moving your hips only (not shoulders): move weight onto your left leg, HOLD/FEEL pressure in socket.  Move back to equal weight on each leg, HOLD/FEEL pressure in socket. Move weight onto your right leg, HOLD/FEEL pressure in socket. Move back to equal weight on each leg, HOLD/FEEL pressure in socket. Repeat.  Start with both hands on sink, progress to hand on prosthetic side only Front to Back Shift: Moving your hips only (not shoulders): move your weight forward onto your toes, HOLD/FEEL pressure in socket. Move your weight back to equal Flat Foot on both legs, HOLD/FEEL  pressure in socket. Move your weight back onto your heels, HOLD/FEEL  pressure in socket. Move your weight back to equal on both legs, HOLD/FEEL  pressure in socket. Repeat.  Start with both hands on sink, progress to hand on prosthetic side only Moving Cones / Cups: With equal weight on each  leg: Hold on with one hand the first time, then progress to no hand supports. Move cups from one side of sink to the other. Place cups ~2" out of your reach, progress to 10" beyond reach.  Place one hand in middle of sink and reach with other hand. Do both arms.  Overhead/Upward Reaching: alternated reaching up to top cabinets or ceiling if no cabinets present. Keep equal weight on each leg. Start with one hand support on counter while other hand reaches and progress to no hand support with reaching.  ace one hand in middle of sink and reach with other hand. Do both arms.  5.   Looking Over Shoulders: With equal weight on each leg: alternate turning to look over your shoulders with one hand support on counter as needed.  Start with head motions only to look in front of shoulder, then even with shoulder and progress to looking behind you. To look to side, move head /eyes, then shoulder on side looking pulls back, shift more weight to side looking and pull hip back. Place one hand in middle of sink and let go with other hand so your shoulder can pull back. Switch hands to look other way.   6.  Stepping into lower cabinet:  Move items under cabinet out of your way. Shift your hips/pelvis so weight on prosthesis. Tighten muscles in hip on prosthetic side.  SLOWLY step other leg so front of foot is in cabinet. Then step back to floor. Repeat with other leg.    ASSESSMENT:  CLINICAL IMPRESSION: Patient arrived to session endorsing no pain and has been able to wear prosthesis for up to 14 hours maximum per day. Patient also endorsing that he ambulated within the home without an AD, though uses the wall or counter for stability, with PT discussing importance of using AD during this time periods. Patient tolerated all activities this date with deficits in  static balance especially with visual changes. Patient continues to benefit from skilled PT.    OBJECTIVE IMPAIRMENTS: Abnormal gait, decreased activity  tolerance, decreased balance, decreased endurance, decreased knowledge of condition, decreased knowledge of use of DME, decreased mobility, difficulty walking, decreased strength, increased edema, postural dysfunction, prosthetic dependency , and impaired skin integrity.   ACTIVITY LIMITATIONS: carrying, lifting, bending, sitting, standing, stairs, transfers, reach over head, and locomotion level  PARTICIPATION LIMITATIONS: meal prep, driving, shopping, community activity, and yard work  PERSONAL FACTORS: Fitness, Time since onset of injury/illness/exacerbation, and 3+ comorbidities: see PMH are also affecting patient's functional outcome.   REHAB POTENTIAL: Good  CLINICAL DECISION MAKING: Evolving/moderate complexity  EVALUATION COMPLEXITY: Moderate   GOALS: Goals reviewed with patient? Yes  SHORT TERM GOALS: Target date: 01/28/2024  Patient donnes prosthesis modified independent & verbalizes proper cleaning. Baseline: SEE OBJECTIVE DATA Goal status: Ongoing   01/19/2024 2.  Patient tolerates prosthesis >10 hrs total /day without skin issues or limb pain >5/10 after standing. Baseline: SEE OBJECTIVE DATA Goal status: Ongoing   01/19/2024  3.  Patient able to reach 7, pick up object from floor and look over both shoulders with RW support with supervision. Baseline: SEE OBJECTIVE DATA Goal status: Ongoing   01/19/2024  4. Patient ambulates 31' with RW & prosthesis with supervision. Baseline: SEE OBJECTIVE DATA Goal status: Ongoing   01/19/2024  5. Patient negotiates ramps & curbs with RW & prosthesis with minA. Baseline: SEE OBJECTIVE DATA Goal status: Ongoing   01/19/2024  Aybar TERM GOALS: Target date: 03/25/2024  Patient demonstrates & verbalized understanding of prosthetic care to enable safe utilization of prosthesis. Baseline: SEE OBJECTIVE DATA Goal status: Ongoing   01/19/2024  Patient tolerates prosthesis wear >90% of awake hours without skin or limb pain  issues. Baseline: SEE OBJECTIVE DATA Goal status: Ongoing   01/19/2024  Berg Balance >30/56 to indicate lower fall risk Baseline: SEE OBJECTIVE DATA Goal status: Ongoing   01/19/2024  Patient ambulates >300' with prosthesis & LRAD independently Baseline: SEE OBJECTIVE DATA Goal status: Ongoing   01/19/2024  Patient negotiates ramps, curbs & stairs with single rail with prosthesis & LRAD independently. Baseline: SEE OBJECTIVE DATA Goal status: Ongoing   01/19/2024  Patient reports Patient-Specific Activity Score average to >/= 5 to indicate improvement in functional activities.   Baseline: SEE OBJECTIVE DATA Goal status: Ongoing   01/19/2024  PLAN:  PT FREQUENCY: 2x/week  PT DURATION: 12 weeks  PLANNED INTERVENTIONS: 97164- PT Re-evaluation, 97750- Physical Performance Testing, 97110-Therapeutic exercises, 97530- Therapeutic activity, 97112- Neuromuscular re-education, [phone]- Self Care, [id number]- Gait training, [phone]- Prosthetic Initial , [phone]- Orthotic/Prosthetic subsequent, Patient/Family education, Balance training, Stair training, Scar mobilization, Vestibular training, and DME instructions  PLAN FOR NEXT SESSION:  check if he tried bar stool for ADLs,  leg press,  Continue prosthetic gait with RW, prosthesis and AFO.  Standing balance activities.     Susannah Daring, PT, DPT 01/26/24 4:06 PM

## 2024-01-26 ENCOUNTER — Ambulatory Visit (INDEPENDENT_AMBULATORY_CARE_PROVIDER_SITE_OTHER)

## 2024-01-26 DIAGNOSIS — M21371 Foot drop, right foot: Secondary | ICD-10-CM | POA: Diagnosis not present

## 2024-01-26 DIAGNOSIS — R2689 Other abnormalities of gait and mobility: Secondary | ICD-10-CM | POA: Diagnosis not present

## 2024-01-26 DIAGNOSIS — M6281 Muscle weakness (generalized): Secondary | ICD-10-CM

## 2024-01-26 DIAGNOSIS — R2681 Unsteadiness on feet: Secondary | ICD-10-CM | POA: Diagnosis not present

## 2024-01-26 DIAGNOSIS — L98498 Non-pressure chronic ulcer of skin of other sites with other specified severity: Secondary | ICD-10-CM

## 2024-01-28 ENCOUNTER — Ambulatory Visit (INDEPENDENT_AMBULATORY_CARE_PROVIDER_SITE_OTHER): Admitting: Physical Therapy

## 2024-01-28 ENCOUNTER — Encounter: Admitting: Physical Therapy

## 2024-01-28 ENCOUNTER — Encounter: Payer: Self-pay | Admitting: Physical Therapy

## 2024-01-28 DIAGNOSIS — R2689 Other abnormalities of gait and mobility: Secondary | ICD-10-CM

## 2024-01-28 DIAGNOSIS — R2681 Unsteadiness on feet: Secondary | ICD-10-CM

## 2024-01-28 DIAGNOSIS — M6281 Muscle weakness (generalized): Secondary | ICD-10-CM | POA: Diagnosis not present

## 2024-01-28 DIAGNOSIS — M21371 Foot drop, right foot: Secondary | ICD-10-CM

## 2024-01-28 DIAGNOSIS — L98498 Non-pressure chronic ulcer of skin of other sites with other specified severity: Secondary | ICD-10-CM

## 2024-01-28 NOTE — Therapy (Incomplete)
 OUTPATIENT PHYSICAL THERAPY PROSTHETIC TREATMENT   Patient Name: Scott Navarro. MRN: [id number] DOB:[birth year], 58 y.o., male Today's Date: 01/28/2024  END OF SESSION:     Past Medical History:  Diagnosis Date   Type II diabetes mellitus (HCC) dx'd 10/08/2016   Past Surgical History:  Procedure Laterality Date   AMPUTATION Right 10/09/2016   Procedure: INCISION AND DRAINAGE RIGHT GREAT TOE;  Surgeon: Harden Jerona GAILS, MD;  Location: [facility] OR;  Service: Orthopedics;  Laterality: Right;   AMPUTATION Left 07/23/2023   Procedure: AMPUTATION, BELOW KNEE, LEFT;  Surgeon: Harden Jerona GAILS, MD;  Location: [facility] OR;  Service: Orthopedics;  Laterality: Left;  IRRIGATION AND DEBRIDEMENT AND AMPUTATION OF LEFT LEG   IRRIGATION AND DEBRIDEMENT KNEE Left 07/25/2023   Procedure: IRRIGATION AND DEBRIDEMENT LEFT KNEE;  Surgeon: Harden Jerona GAILS, MD;  Location: [facility] OR;  Service: Orthopedics;  Laterality: Left;  DEBRIDEMENT LEFT LEG   Patient Active Problem List   Diagnosis Date Noted   Coping style affecting medical condition 08/05/2023   S/P BKA (below knee amputation), left (HCC) 07/30/2023   ARF (acute renal failure) 07/23/2023   Hyperlipidemia 07/23/2023   Amputation below knee (HCC) 07/23/2023   Necrotizing fasciitis (HCC) 07/22/2023   Anemia 07/22/2023   Hyponatremia 07/22/2023   Diabetic retinopathy associated with type 2 diabetes mellitus (HCC) 02/18/2023   Charcot joint of left foot 02/18/2023   Encounter for lipid screening for cardiovascular disease 02/18/2023   Screening for prostate cancer 02/18/2023   Type 2 diabetes mellitus with hyperglycemia (HCC) 11/28/2022   Gait instability 11/28/2022   Retinal hemorrhage 11/21/2022   Glaucoma 11/21/2022   Achilles tendon contracture, right 11/21/2016   Hypokalemia    Class 1 obesity due to excess calories with body mass index (BMI) of 33.0 to 33.9 in adult    Wound infection 10/09/2016   Hyperglycemia 10/09/2016   Elevated blood pressure  reading 10/09/2016   Diabetic polyneuropathy associated with type 2 diabetes mellitus (HCC)    Laceration w FB of right great toe w/o damage to nail, init     PCP: Bulah Alm RAMAN, PA-C  REFERRING PROVIDER: Maurilio CHRISTELLA Collet, PA-C  ONSET DATE:  12/26/2023 prosthesis delivery  REFERRING DIAG: S10.487 (ICD-10-CM) - S/P BKA (below knee amputation), left   THERAPY DIAG:  No diagnosis found.  Rationale for Evaluation and Treatment: Rehabilitation  SUBJECTIVE:   SUBJECTIVE STATEMENT: ***   Patient reports that he is going to the prosthetist on Wednesday and they will be replacing the ankle with the articulating ankle. Patient has been able to wear prosthesis for 14 hours a day max.  Pt accompanied by: family member  PERTINENT HISTORY: left TTA 07/23/23, DM2, polyneuropathy, Acute renal failure, HLD  PAIN:  Are you having pain? No  PRECAUTIONS: Fall  RED FLAGS: None   WEIGHT BEARING RESTRICTIONS: No  FALLS: Has patient fallen in last 6 months? No  LIVING ENVIRONMENT: Lives with: lives with spouse and mother with Alzheimer's. He is currently living with son who had recent medical issue making him bed / wheelchair bound.  His wife & he are son's caregivers but think is not permanent situation.   Lives in: House his house is 2 story development worker, community / bedroom main level) and son's house is single level (where they are currently staying).  Home Access: 3 steps to main entrance to his home and son's house has ramp.  Stairs: Yes: Internal: 14 steps; on right going up and External: 3 steps; on left  going up Has following equipment at home: Single point cane, Walker - 2 wheeled, Wheelchair (manual), Shower bench, and bed side commode  PLOF: Independent, Independent with household mobility without device, and Independent with community mobility without device  Occupation: retired  PATIENT GOALS: to use prosthesis normal activities: driving, shopping, yard work,  active at place in  [place] [employer],   OBJECTIVE:  Patient-Specific Activity Scoring Scheme  0 represents "unable to perform." 10 represents "able to perform at prior level. 0 1 2 3 4 5 6 7 8 9  10 (Date and Score)   Activity Eval 12/29/23    1. Care & wear of prosthesis  1    2. Standing ADLs   2    3. Walking  2   4. Outdoor activities 0   5.    Score 1.25    Total score = sum of the activity scores/number of activities Minimum detectable change (90%CI) for average score = 2 points Minimum detectable change (90%CI) for single activity score = 3 points  COGNITION: Evaluation on 12/29/2023: Overall cognitive status: Within functional limits for tasks assessed   SENSATION: Evaluation on 12/29/2023: [facility]  CARDIOVASCULAR RESPONSE: Evaluation on 12/29/2023: Functional activity: standing balance & gait Post-activity vitals: HR: 116 SpO2: 100% Modified Borg scale for dyspnea: 2: mild shortness of breath  POSTURE:  Evaluation on 12/29/2023: rounded shoulders, forward head, flexed trunk , and weight shift right  LOWER EXTREMITY ROM:  ROM P:passive  A:active Right Eval 12/29/23 Left Eval 12/29/23  Hip flexion    Hip extension    Hip abduction    Hip adduction    Hip internal rotation    Hip external rotation    Knee flexion    Knee extension  0*  Ankle dorsiflexion    Ankle plantarflexion    Ankle inversion    Ankle eversion     (Blank rows = not tested)  LOWER EXTREMITY MMT:  MMT Right Eval 12/29/23 Left Eval 12/2723  Hip flexion    Hip extension 3/5   Hip abduction 3/5   Hip adduction    Hip internal rotation    Hip external rotation    Knee flexion 3/5   Knee extension 3/5 4/5  Ankle dorsiflexion  2-/5  Ankle plantarflexion    Ankle inversion    Ankle eversion    At Evaluation all strength testing is grossly seated and functionally standing / gait. (Blank rows = not tested)  TRANSFERS: Evaluation on 12/29/2023: Sit to stand: SBA from 22 w/c requires  use of armrests and back of legs against chair &/or RW for stabilization. Stand to sit: SBA RW to 22 w/c requires use of armrests and back of legs against chair &/or RW for stabilization.  FUNCTIONAL TESTs:  Evaluation on 12/29/2023: Lars Balance 12/56  [facility] PT Assessment - 12/29/23 0900       Standardized Balance Assessment   Standardized Balance Assessment Berg Balance Test      Berg Balance Test   Sit to Stand Needs minimal aid to stand or to stabilize    Standing Unsupported Needs several tries to stand 30 seconds unsupported    Sitting with Back Unsupported but Feet Supported on Floor or Stool Able to sit safely and securely 2 minutes    Stand to Sit Controls descent by using hands    Transfers Able to transfer safely, definite need of hands    Standing Unsupported with Eyes Closed Needs help to keep from falling  Standing Unsupported with Feet Together Needs help to attain position and unable to hold for 15 seconds    From Standing, Reach Forward with Outstretched Arm Loses balance while trying/requires external support    From Standing Position, Pick up Object from Floor Unable to try/needs assist to keep balance    From Standing Position, Turn to Look Behind Over each Shoulder Needs assist to keep from losing balance and falling    Turn 360 Degrees Needs assistance while turning    Standing Unsupported, Alternately Place Feet on Step/Stool Needs assistance to keep from falling or unable to try    Standing Unsupported, One Foot in [employer] balance while stepping or standing    Standing on One Leg Unable to try or needs assist to prevent fall    Total Score 12    Berg comment: < 36 high risk for falls (close to 100%) 46-51 moderate (>50%)   37-45 significant (>80%) 52-55 lower (> 25%)          GAIT: Evaluation on 12/29/2023: Distance walked: 60' Assistive device utilized: [employer] - 2 wheeled and TTA prosthesis Level of assistance: Min A (multiple balance losses  requiring PT assist to stabilize especially in turns) Gait pattern: step through pattern, decreased step length- Right, decreased stance time- Left, decreased hip/knee flexion- Left, decreased ankle dorsiflexion- Right, Right hip hike, Left hip hike, genu recurvatum- Right, antalgic, trendelenburg, lateral hip instability, trunk flexed, and poor foot clearance- Right, left knee instability / flexion moment intermittently  Gait velocity:  0.97 ft/sec Comments: excess UE weight bearing on RW.    CURRENT PROSTHETIC WEAR ASSESSMENT: Evaluation on 12/29/2023:  Patient is dependent with: skin check, residual limb care, care of non-amputated limb, prosthetic cleaning, ply sock cleaning, correct ply sock adjustment, proper wear schedule/adjustment, and proper weight-bearing schedule/adjustment Donning prosthesis: SBA Doffing prosthesis: SBA Prosthetic wear tolerance: up to 1.5 hours, 2x/day, 2 of 3 days since delivery Prosthetic weight bearing tolerance: 5 minutes with no c/o residual limb pain Edema: pitting Residual limb condition: cylindrical shape, 3 cm superficial scab at anterior-lateral limb with no signs of infection, small wound on lateral limb proximal to fibular head with white covering 0.4 cm, invaginated scar lateral aspect, dry flaking skin, no hair growth, redness over patella (friction from liner when seated), normal temperature.  Prosthetic description: silicon (9 mm anterior portion) with pin lock suspension, total contact socket with flexible inner socket.    TODAY'S TREATMENT:                                                                                                                             DATE:  01/28/2024: Prosthetic Care with left Transtibial  prosthesis and right blue rocker AFO: ***  PT discussed importance of performing stool activities at home to increase time on prosthesis as well as increasing standing endurance.  PT discussed importance of safety when ambulating  within the home to include use of RW and how  wall-walking/counter surfing isn't a safe option.   Neuro Re-Ed: all performed with RW in front and MWC behind  Patient performed square balance with tape on floor. Patient originally attempted to perform without RW and CGA only, though highly unstable and requiring min A and RW to stabilize. Patient then performed 3x with RW and CGA, however, patient requiring several verbal cues for appropriate form and to decrease step size.  Patient performed static balance activities with CGA-max A.  Narrow stance balance 2x30s with intermittent increase of assist to min A and intermittent use of RW for stability with LOB Typical stance with head turns  Horizontal 1x14 with intermittent LOB requiring increase assist to min A and intermittent use of RW for stability with LOB Vertical 1x5 with large LOB requiring increased assist to mod-max A and intermittent use of RW for stability  Horizontal 1x20 with intermittent increase assist to min A, though able to mostly maintain CGA Up to center 1x10 with intermittent increase assist to min A and use of RW for stability  Down to center with intermittent increase assist to min A and use of RW for stability  Staggered stance 1x30s each side  with intermittent min-mod A with Lt LE back  mod-max A with Rt LE back for most of 30s  PT educating patient on the three balance systems (visual, proprioception, and vestibular) as well as the different balance strategies required to self-recover from any LOB.     TREATMENT:                                                                                                                             DATE:  01/26/2024: Prosthetic Care with left Transtibial  prosthesis and right blue rocker AFO: PT discussed importance of performing stool activities at home to increase time on prosthesis as well as increasing standing endurance.  PT discussed importance of safety when ambulating within  the home to include use of RW and how wall-walking/counter surfing isn't a safe option.   Neuro Re-Ed: all performed with RW in front and MWC behind  Patient performed square balance with tape on floor. Patient originally attempted to perform without RW and CGA only, though highly unstable and requiring min A and RW to stabilize. Patient then performed 3x with RW and CGA, however, patient requiring several verbal cues for appropriate form and to decrease step size.  Patient performed static balance activities with CGA-max A.  Narrow stance balance 2x30s with intermittent increase of assist to min A and intermittent use of RW for stability with LOB Typical stance with head turns  Horizontal 1x14 with intermittent LOB requiring increase assist to min A and intermittent use of RW for stability with LOB Vertical 1x5 with large LOB requiring increased assist to mod-max A and intermittent use of RW for stability  Horizontal 1x20 with intermittent increase assist to min A, though able to mostly maintain CGA Up to center 1x10 with intermittent increase  assist to min A and use of RW for stability  Down to center with intermittent increase assist to min A and use of RW for stability  Staggered stance 1x30s each side  with intermittent min-mod A with Lt LE back  mod-max A with Rt LE back for most of 30s  PT educating patient on the three balance systems (visual, proprioception, and vestibular) as well as the different balance strategies required to self-recover from any LOB.   TODAY'S TREATMENT:                                                                                                                             DATE:  01/21/2024: Prosthetic Care with left Transtibial  prosthesis and right blue rocker AFO: Patient ambulated 140' & 100' with RW and CGA to SBA with verbal & visual cues for step width and upright posture.   Pt neg ramp with RW with minA with PT verbal cues on technique especially  ascending to limit poor knee control.  Pt neg curb with RW with minA with cues on technique.  Residual limb has no opening but skin chafing distal portion.  PT reviewed adjusting ply socks as too few can be cause of distal pressure.  PT recommended increasing wear from arising to bedtime except 2 hours mid-day. Pat Dry limb/liner half way of both wears.  PT educated on using prosthesis & AFO to enter/exit bathroom.  Pt verbalized understanding.    Therapeutic Activities: PT demo & verbal cues on using bar stool for standing ADLs including sit to/from stand.  Pt able to sit / stand on 24 stool safely.  Pt able to sit on 24 or 29 stool with feet on floor and buttocks on seat.  PT educated on performing ADLS so BUE free and UEs / vision closer to standing.  Pt verbalized understanding.   Leg press BLEs 81# 10 reps 2 sets;  single LE RLE 43# 10 reps and LLE 37# 10 reps.   Standing back extension with CGA - forward lean placing hands on chair and upright 5 sec for 10 reps.      TREATMENT:                                                                                                                             DATE:  01/19/2024: Prosthetic Care with left Transtibial  prosthesis and right blue rocker AFO: Patient  ambulated 140' & 60 with RW and CGA with verbal & visual cues for step width and upright posture.   Pt neg ramp with RW with minA 4 reps with PT demo & verbal cues on technique especially ascending to limit poor knee control.  Pt neg curb with RW with minA with cues on technique.   Neuro Re-Ed:  Standing balance to facilitate core stabilization and balance reactions.  UE resistance with green theraband single UEs and BUEs 10 reps each rows, forward reach and biceps curl.  Mod-MinA for balance reactions.   Standing with BUE support with slider forward and backward motions with focus on stance LE / knee stability 10 reps with each LE.   Standing LE resistance with green theraband figure 8  above knee level abduction and extension 10 reps ea with BUE support.  PT cues on upright posture.   Stepping LE stance onto Airex mat and contralateral LE stepping from floor posteriorly to tapping knee on counter anteriorly 10 reps with ea LE.  BUE support on sink & minA / tactile cues for balance reactions.      TREATMENT:                                                                                                                             DATE:  01/16/2024: Prosthetic Care with left Transtibial  prosthesis and right blue rocker AFO: PT discussed appropriate ply sock wear at beginning of session and assessed patient's Lt LE within prosthesis at end of session and discussed requirement for adding socks for the remaining of the day.  PT discussed and educated on the physical and mental challenges following amputation.  PT educated patient on the progression from RW to no AD (RW to bilat canes to unilat cane to no AD). PT demonstrated 4-point ambulation with bilat canes using SPC with stand alone tips while verbalizing 1-2-3-4 and discussing appropriate placement of canes to assist with appropriate step length and foot placement. Patient then attempted to perform ambulation with this technique within parallel bars 1x down and back with close CGA from PT. Patient highly unstable with this technique.  Patient ambulated with RW and CGA for 89.5' and 67.5' with narrow BOS, ER of bilat LEs, and increased hip flexion of Rt LE with Rt swing.   Neuro Re-Ed:  Patient performed parts of HEP at sink with CGA-mod A for review and to increase weightbearing, weight shifting, and stability. Patient performed fwd/back weight shifts 1x10 slowly and with intermittent requirement for UE use on sink and up to min A from PT secondary to fwd and posterior LOB.  Then patient performed lateral weight shifts 1x10 slowly and with intermittent UE use and min A secondary to LOB. Lastly, patient performed Lt weight shift  with Rt step fwd/back with intermittent mod A secondary to LOB and multimodal cueing required for appropriate form. Though patient required mod A for one instance of LOB, patient did perform an  appropriate Lt stepping reaction during LOB. Patient would benefit from continued focus on static stability activities.  Patient performed Lt weight shift with Rt fwd/back step 1x5 with CGA with mirror use for visual feedback and bilat UE use for stability. Patient attempted to perform without UE use with high levels of instability.   Patient ambulated in parallel bars 1x with bilat UE use using mirror for visual feedback. Patient then decreased UE use to Rt UE only to simulate cane 1x3 down and back.  Patient ambulated fwd/back 1x2 down and back in parallel bars with bilat UE and CGA. Patient attempted to perform with unilat UE and without UE use, though showed high increases in instability. PT discussed change in balance strategies and proprioception following amputation.      HOME EXERCISE PROGRAM: Do each exercise 1-2  times per day Do each exercise 5-10 repetitions Hold each exercise for 2 seconds to feel your location  AT SINK FIND YOUR MIDLINE POSITION AND PLACE FEET EQUAL DISTANCE FROM THE MIDLINE.  Try to find this position when standing still for activities.   USE TAPE ON FLOOR TO MARK THE MIDLINE POSITION which is even with middle of sink.  You also should try to feel with your limb pressure in socket.  You are trying to feel with limb what you used to feel with the bottom of your foot.  Side to Side Shift: Moving your hips only (not shoulders): move weight onto your left leg, HOLD/FEEL pressure in socket.  Move back to equal weight on each leg, HOLD/FEEL pressure in socket. Move weight onto your right leg, HOLD/FEEL pressure in socket. Move back to equal weight on each leg, HOLD/FEEL pressure in socket. Repeat.  Start with both hands on sink, progress to hand on prosthetic side only Front to  Back Shift: Moving your hips only (not shoulders): move your weight forward onto your toes, HOLD/FEEL pressure in socket. Move your weight back to equal Flat Foot on both legs, HOLD/FEEL  pressure in socket. Move your weight back onto your heels, HOLD/FEEL  pressure in socket. Move your weight back to equal on both legs, HOLD/FEEL  pressure in socket. Repeat.  Start with both hands on sink, progress to hand on prosthetic side only Moving Cones / Cups: With equal weight on each leg: Hold on with one hand the first time, then progress to no hand supports. Move cups from one side of sink to the other. Place cups ~2" out of your reach, progress to 10" beyond reach.  Place one hand in middle of sink and reach with other hand. Do both arms.  Overhead/Upward Reaching: alternated reaching up to top cabinets or ceiling if no cabinets present. Keep equal weight on each leg. Start with one hand support on counter while other hand reaches and progress to no hand support with reaching.  ace one hand in middle of sink and reach with other hand. Do both arms.  5.   Looking Over Shoulders: With equal weight on each leg: alternate turning to look over your shoulders with one hand support on counter as needed.  Start with head motions only to look in front of shoulder, then even with shoulder and progress to looking behind you. To look to side, move head /eyes, then shoulder on side looking pulls back, shift more weight to side looking and pull hip back. Place one hand in middle of sink and let go with other hand so your shoulder can pull back. Switch hands to  look other way.   6.  Stepping into lower cabinet:  Move items under cabinet out of your way. Shift your hips/pelvis so weight on prosthesis. Tighten muscles in hip on prosthetic side.  SLOWLY step other leg so front of foot is in cabinet. Then step back to floor. Repeat with other leg.    ASSESSMENT:  CLINICAL IMPRESSION: ***   Patient arrived to session endorsing no  pain and has been able to wear prosthesis for up to 14 hours maximum per day. Patient also endorsing that he ambulated within the home without an AD, though uses the wall or counter for stability, with PT discussing importance of using AD during this time periods. Patient tolerated all activities this date with deficits in static balance especially with visual changes. Patient continues to benefit from skilled PT.    OBJECTIVE IMPAIRMENTS: Abnormal gait, decreased activity tolerance, decreased balance, decreased endurance, decreased knowledge of condition, decreased knowledge of use of DME, decreased mobility, difficulty walking, decreased strength, increased edema, postural dysfunction, prosthetic dependency , and impaired skin integrity.   ACTIVITY LIMITATIONS: carrying, lifting, bending, sitting, standing, stairs, transfers, reach over head, and locomotion level  PARTICIPATION LIMITATIONS: meal prep, driving, shopping, community activity, and yard work  PERSONAL FACTORS: Fitness, Time since onset of injury/illness/exacerbation, and 3+ comorbidities: see PMH are also affecting patient's functional outcome.   REHAB POTENTIAL: Good  CLINICAL DECISION MAKING: Evolving/moderate complexity  EVALUATION COMPLEXITY: Moderate   GOALS: Goals reviewed with patient? Yes  SHORT TERM GOALS: Target date: 01/28/2024  Patient donnes prosthesis modified independent & verbalizes proper cleaning. Baseline: SEE OBJECTIVE DATA Goal status: Ongoing    01/28/2024 2.  Patient tolerates prosthesis >10 hrs total /day without skin issues or limb pain >5/10 after standing. Baseline: SEE OBJECTIVE DATA Goal status: MET 01/26/2024  3.  Patient able to reach 7, pick up object from floor and look over both shoulders with RW support with supervision. Baseline: SEE OBJECTIVE DATA Goal status: Ongoing    01/28/2024  4. Patient ambulates 80' with RW & prosthesis with supervision. Baseline: SEE OBJECTIVE DATA Goal  status: Ongoing    01/28/2024  5. Patient negotiates ramps & curbs with RW & prosthesis with minA. Baseline: SEE OBJECTIVE DATA Goal status:  MET  01/19/2024  [place]ason TERM GOALS: Target date: 03/25/2024  Patient demonstrates & verbalized understanding of prosthetic care to enable safe utilization of prosthesis. Baseline: SEE OBJECTIVE DATA Goal status: Ongoing  01/28/2024  Patient tolerates prosthesis wear >90% of awake hours without skin or limb pain issues. Baseline: SEE OBJECTIVE DATA Goal status: Ongoing    01/28/2024  Berg Balance >30/56 to indicate lower fall risk Baseline: SEE OBJECTIVE DATA Goal status: Ongoing    01/28/2024  Patient ambulates >300' with prosthesis & LRAD independently Baseline: SEE OBJECTIVE DATA Goal status: Ongoing   01/28/2024  Patient negotiates ramps, curbs & stairs with single rail with prosthesis & LRAD independently. Baseline: SEE OBJECTIVE DATA Goal status: Ongoing    01/28/2024  Patient reports Patient-Specific Activity Score average to >/= 5 to indicate improvement in functional activities.   Baseline: SEE OBJECTIVE DATA Goal status: Ongoing   01/28/2024  PLAN:  PT FREQUENCY: 2x/week  PT DURATION: 12 weeks  PLANNED INTERVENTIONS: 97164- PT Re-evaluation, 97750- Physical Performance Testing, 97110-Therapeutic exercises, 97530- Therapeutic activity, [id number]- Neuromuscular re-education, [phone]- Self Care, 02883- Gait training, [id number]- Prosthetic Initial , [phone]- Orthotic/Prosthetic subsequent, Patient/Family education, Balance training, Stair training, Scar mobilization, Vestibular training, and DME instructions  PLAN FOR NEXT SESSION:  ***  check if he tried bar stool for ADLs,  leg press,  Continue prosthetic gait with RW, prosthesis and AFO.  Standing balance activities.      Grayce Spatz, PT, DPT 01/28/2024, 7:25 AM

## 2024-01-28 NOTE — Therapy (Signed)
 OUTPATIENT PHYSICAL THERAPY PROSTHETIC TREATMENT   Patient Name: Scott Navarro. MRN: [id number] DOB:1965/07/02, 58 y.o., male Today's Date: 01/28/2024  END OF SESSION:  PT End of Session - 01/28/24 1250     Visit Number 9    Number of Visits 25    Date for Recertification  03/25/24    Authorization Type [employer] other    Authorization - Visit Number 9    Authorization - Number of Visits 90    PT Start Time 1300    PT Stop Time 1344    PT Time Calculation (min) 44 min    Equipment Utilized During Treatment Gait belt    Activity Tolerance Patient tolerated treatment well    Behavior During Therapy WFL for tasks assessed/performed            Past Medical History:  Diagnosis Date   Type II diabetes mellitus (HCC) dx'd 10/08/2016   Past Surgical History:  Procedure Laterality Date   AMPUTATION Right 10/09/2016   Procedure: INCISION AND DRAINAGE RIGHT GREAT TOE;  Surgeon: Harden Jerona GAILS, MD;  Location: [facility] OR;  Service: Orthopedics;  Laterality: Right;   AMPUTATION Left 07/23/2023   Procedure: AMPUTATION, BELOW KNEE, LEFT;  Surgeon: Harden Jerona GAILS, MD;  Location: [facility] OR;  Service: Orthopedics;  Laterality: Left;  IRRIGATION AND DEBRIDEMENT AND AMPUTATION OF LEFT LEG   IRRIGATION AND DEBRIDEMENT KNEE Left 07/25/2023   Procedure: IRRIGATION AND DEBRIDEMENT LEFT KNEE;  Surgeon: Harden Jerona GAILS, MD;  Location: [facility] OR;  Service: Orthopedics;  Laterality: Left;  DEBRIDEMENT LEFT LEG   Patient Active Problem List   Diagnosis Date Noted   Coping style affecting medical condition 08/05/2023   S/P BKA (below knee amputation), left (HCC) 07/30/2023   ARF (acute renal failure) 07/23/2023   Hyperlipidemia 07/23/2023   Amputation below knee (HCC) 07/23/2023   Necrotizing fasciitis (HCC) 07/22/2023   Anemia 07/22/2023   Hyponatremia 07/22/2023   Diabetic retinopathy associated with type 2 diabetes mellitus (HCC) 02/18/2023   Charcot joint of left foot 02/18/2023   Encounter for lipid  screening for cardiovascular disease 02/18/2023   Screening for prostate cancer 02/18/2023   Type 2 diabetes mellitus with hyperglycemia (HCC) 11/28/2022   Gait instability 11/28/2022   Retinal hemorrhage 11/21/2022   Glaucoma 11/21/2022   Achilles tendon contracture, right 11/21/2016   Hypokalemia    Class 1 obesity due to excess calories with body mass index (BMI) of 33.0 to 33.9 in adult    Wound infection 10/09/2016   Hyperglycemia 10/09/2016   Elevated blood pressure reading 10/09/2016   Diabetic polyneuropathy associated with type 2 diabetes mellitus (HCC)    Laceration w FB of right great toe w/o damage to nail, init     PCP: Bulah Alm RAMAN, PA-C  REFERRING PROVIDER: Maurilio CHRISTELLA Collet, PA-C  ONSET DATE:  12/26/2023 prosthesis delivery  REFERRING DIAG: S10.487 (ICD-10-CM) - S/P BKA (below knee amputation), left   THERAPY DIAG:  Other abnormalities of gait and mobility  Unsteadiness on feet  Muscle weakness (generalized)  Foot drop, right  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 No redness or pain. Prosthetist switched prosthetic foot and minor alignment change.  He arrived using rollator walker today which is first time arriving without w/c.    PERTINENT HISTORY: left TTA 07/23/23, DM2, polyneuropathy, Acute renal  failure, HLD  PAIN:  Are you having pain? No  PRECAUTIONS: Fall  RED FLAGS: None   WEIGHT BEARING RESTRICTIONS: No  FALLS: Has patient fallen in last 6 months? No  LIVING ENVIRONMENT: Lives with: lives with spouse and mother with Alzheimer's. He is currently living with son who had recent medical issue making him bed / wheelchair bound.  His wife & he are son's caregivers but think is not permanent situation.   Lives in: House his house  is 2 story development worker, community / bedroom main level) and son's house is single level (where they are currently staying).  Home Access: 3 steps to main entrance to his home and son's house has ramp.  Stairs: Yes: Internal: 14 steps; on right going up and External: 3 steps; on left going up Has following equipment at home: Single point cane, Walker - 2 wheeled, Wheelchair (manual), Shower bench, and bed side commode  PLOF: Independent, Independent with household mobility without device, and Independent with community mobility without device  Occupation: retired  PATIENT GOALS: to use prosthesis normal activities: driving, shopping, yard work,  active at place in [employer],   OBJECTIVE:  Patient-Specific Activity Scoring Scheme  0 represents "unable to perform." 10 represents "able to perform at prior level. 0 1 2 3 4 5 6 7 8 9  10 (Date and Score)   Activity Eval 12/29/23    1. Care & wear of prosthesis  1    2. Standing ADLs   2    3. Walking  2   4. Outdoor activities 0   5.    Score 1.25    Total score = sum of the activity scores/number of activities Minimum detectable change (90%CI) for average score = 2 points Minimum detectable change (90%CI) for single activity score = 3 points  COGNITION: Evaluation on 12/29/2023: Overall cognitive status: Within functional limits for tasks assessed   SENSATION: Evaluation on 12/29/2023: [facility]  CARDIOVASCULAR RESPONSE: Evaluation on 12/29/2023: Functional activity: standing balance & gait Post-activity vitals: HR: 116 SpO2: 100% Modified Borg scale for dyspnea: 2: mild shortness of breath  POSTURE:  Evaluation on 12/29/2023: rounded shoulders, forward head, flexed trunk , and weight shift right  LOWER EXTREMITY ROM:  ROM P:passive  A:active Right Eval 12/29/23 Left Eval 12/29/23  Hip flexion    Hip extension    Hip abduction    Hip adduction    Hip internal rotation    Hip external rotation    Knee flexion     Knee extension  0*  Ankle dorsiflexion    Ankle plantarflexion    Ankle inversion    Ankle eversion     (Blank rows = not tested)  LOWER EXTREMITY MMT:  MMT Right Eval 12/29/23 Left Eval 12/2723  Hip flexion    Hip extension 3/5   Hip abduction 3/5   Hip adduction    Hip internal rotation    Hip external rotation    Knee flexion 3/5   Knee extension 3/5 4/5  Ankle dorsiflexion  2-/5  Ankle plantarflexion    Ankle inversion    Ankle eversion    At Evaluation all strength testing is grossly seated and functionally standing / gait. (Blank rows = not tested)  TRANSFERS: Evaluation on 12/29/2023: Sit to stand: SBA from 22 w/c requires use of armrests and back of legs against chair &/or RW for stabilization. Stand to sit: SBA RW to 22 w/c requires use of armrests and back of legs against  chair &/or RW for stabilization.  FUNCTIONAL TESTs:  01/28/2024:  with locked rollator walker support & PT supervision: pt able to reach 7 anteriorly, pick up item from floor and look over his shoulders.  Evaluation on 12/29/2023: Lars Balance 12/56  [facility] PT Assessment - 12/29/23 0900       Standardized Balance Assessment   Standardized Balance Assessment Berg Balance Test      Berg Balance Test   Sit to Stand Needs minimal aid to stand or to stabilize    Standing Unsupported Needs several tries to stand 30 seconds unsupported    Sitting with Back Unsupported but Feet Supported on Floor or Stool Able to sit safely and securely 2 minutes    Stand to Sit Controls descent by using hands    Transfers Able to transfer safely, definite need of hands    Standing Unsupported with Eyes Closed Needs help to keep from falling    Standing Unsupported with Feet Together Needs help to attain position and unable to hold for 15 seconds    From Standing, Reach Forward with Outstretched Arm Loses balance while trying/requires external support    From Standing Position, Pick up Object from Floor  Unable to try/needs assist to keep balance    From Standing Position, Turn to Look Behind Over each Shoulder Needs assist to keep from losing balance and falling    Turn 360 Degrees Needs assistance while turning    Standing Unsupported, Alternately Place Feet on Step/Stool Needs assistance to keep from falling or unable to try    Standing Unsupported, One Foot in [employer] balance while stepping or standing    Standing on One Leg Unable to try or needs assist to prevent fall    Total Score 12    Berg comment: < 36 high risk for falls (close to 100%) 46-51 moderate (>50%)   37-45 significant (>80%) 52-55 lower (> 25%)          GAIT: 01/28/2024: Pt amb 150' with rollator walker with supervision.  PT neg ramp & curb with rollator walker with min/CGA.  Evaluation on 12/29/2023: Distance walked: 60' Assistive device utilized: [employer] - 2 wheeled and TTA prosthesis Level of assistance: Min A (multiple balance losses requiring PT assist to stabilize especially in turns) Gait pattern: step through pattern, decreased step length- Right, decreased stance time- Left, decreased hip/knee flexion- Left, decreased ankle dorsiflexion- Right, Right hip hike, Left hip hike, genu recurvatum- Right, antalgic, trendelenburg, lateral hip instability, trunk flexed, and poor foot clearance- Right, left knee instability / flexion moment intermittently  Gait velocity:  0.97 ft/sec Comments: excess UE weight bearing on RW.    CURRENT PROSTHETIC WEAR ASSESSMENT: Evaluation on 12/29/2023:  Patient is dependent with: skin check, residual limb care, care of non-amputated limb, prosthetic cleaning, ply sock cleaning, correct ply sock adjustment, proper wear schedule/adjustment, and proper weight-bearing schedule/adjustment Donning prosthesis: SBA Doffing prosthesis: SBA Prosthetic wear tolerance: up to 1.5 hours, 2x/day, 2 of 3 days since delivery Prosthetic weight bearing tolerance: 5 minutes with no c/o residual  limb pain Edema: pitting Residual limb condition: cylindrical shape, 3 cm superficial scab at anterior-lateral limb with no signs of infection, small wound on lateral limb proximal to fibular head with white covering 0.4 cm, invaginated scar lateral aspect, dry flaking skin, no hair growth, redness over patella (friction from liner when seated), normal temperature.  Prosthetic description: silicon (9 mm anterior portion) with pin lock suspension, total contact socket with flexible inner socket.  TODAY'S TREATMENT:                                                                                                                             DATE:  01/28/2024: Prosthetic Care with left Transtibial  prosthesis and right blue rocker AFO: PT demo & verbal cues on rollator walker safety & use.  PT demo & verbal cues on sit to/from stand to rollator from standard chair, sit to/from stand to rollator seat, proper hand position on brakes, proper location of his body/feet to rollator while ambulating, turning with rollator walker and negotiating ramp & curb.  Patient performed all with PT in clinic and verbalized better understanding.   Pt amb 150' with rollator walker with supervision.  PT neg ramp & curb with rollator walker with min/CGA. Pt able to sit on rollator seat safely and stand up with one hand on handle & other lower point where back support attaches.    Therapeutic Activities:  Leg press BLEs 106# 10 reps 2 sets;  single LE - 10 reps 2 sets RLE 43# & LLE 37# See objective data  Neuromuscular Re-education: working on standing balance  Standing in corner with chair back in front:  Wide stance on floor eyes open head turns right/left, up/down & diagonals with frequent touch walls or chair.  Cues for stabilizing with equal wt BLEs at midpoint of each motion.   Wide stance finger tip support on side of chair (limit weight bearing thru UEs) on floor eyes open head turns right/left, up/down &  diagonals with frequent touch walls or chair.  Cues for stabilizing with equal wt BLEs at midpoint of each motion.      TREATMENT:                                                                                                                             DATE:  01/26/2024: Prosthetic Care with left Transtibial  prosthesis and right blue rocker AFO: PT discussed importance of performing stool activities at home to increase time on prosthesis as well as increasing standing endurance.  PT discussed importance of safety when ambulating within the home to include use of RW and how wall-walking/counter surfing isn't a safe option.   Neuro Re-Ed: all performed with RW in front and MWC behind  Patient performed square balance with tape on floor. Patient originally attempted to perform without RW and CGA only, though highly  unstable and requiring min A and RW to stabilize. Patient then performed 3x with RW and CGA, however, patient requiring several verbal cues for appropriate form and to decrease step size.  Patient performed static balance activities with CGA-max A.  Narrow stance balance 2x30s with intermittent increase of assist to min A and intermittent use of RW for stability with LOB Typical stance with head turns  Horizontal 1x14 with intermittent LOB requiring increase assist to min A and intermittent use of RW for stability with LOB Vertical 1x5 with large LOB requiring increased assist to mod-max A and intermittent use of RW for stability  Horizontal 1x20 with intermittent increase assist to min A, though able to mostly maintain CGA Up to center 1x10 with intermittent increase assist to min A and use of RW for stability  Down to center with intermittent increase assist to min A and use of RW for stability  Staggered stance 1x30s each side  with intermittent min-mod A with Lt LE back  mod-max A with Rt LE back for most of 30s  PT educating patient on the three balance systems (visual,  proprioception, and vestibular) as well as the different balance strategies required to self-recover from any LOB.   TODAY'S TREATMENT:                                                                                                                             DATE:  01/21/2024: Prosthetic Care with left Transtibial  prosthesis and right blue rocker AFO: Patient ambulated 140' & 100' with RW and CGA to SBA with verbal & visual cues for step width and upright posture.   Pt neg ramp with RW with minA with PT verbal cues on technique especially ascending to limit poor knee control.  Pt neg curb with RW with minA with cues on technique.  Residual limb has no opening but skin chafing distal portion.  PT reviewed adjusting ply socks as too few can be cause of distal pressure.  PT recommended increasing wear from arising to bedtime except 2 hours mid-day. Pat Dry limb/liner half way of both wears.  PT educated on using prosthesis & AFO to enter/exit bathroom.  Pt verbalized understanding.    Therapeutic Activities: PT demo & verbal cues on using bar stool for standing ADLs including sit to/from stand.  Pt able to sit / stand on 24 stool safely.  Pt able to sit on 24 or 29 stool with feet on floor and buttocks on seat.  PT educated on performing ADLS so BUE free and UEs / vision closer to standing.  Pt verbalized understanding.   Leg press BLEs 81# 10 reps 2 sets;  single LE RLE 43# 10 reps and LLE 37# 10 reps.   Standing back extension with CGA - forward lean placing hands on chair and upright 5 sec for 10 reps.      TREATMENT:  DATE:  01/19/2024: Prosthetic Care with left Transtibial  prosthesis and right blue rocker AFO: Patient ambulated 140' & 60 with RW and CGA with verbal & visual cues for step width and upright posture.   Pt neg ramp with RW with minA 4 reps with PT demo  & verbal cues on technique especially ascending to limit poor knee control.  Pt neg curb with RW with minA with cues on technique.   Neuro Re-Ed:  Standing balance to facilitate core stabilization and balance reactions.  UE resistance with green theraband single UEs and BUEs 10 reps each rows, forward reach and biceps curl.  Mod-MinA for balance reactions.   Standing with BUE support with slider forward and backward motions with focus on stance LE / knee stability 10 reps with each LE.   Standing LE resistance with green theraband figure 8 above knee level abduction and extension 10 reps ea with BUE support.  PT cues on upright posture.   Stepping LE stance onto Airex mat and contralateral LE stepping from floor posteriorly to tapping knee on counter anteriorly 10 reps with ea LE.  BUE support on sink & minA / tactile cues for balance reactions.      TREATMENT:                                                                                                                             DATE:  01/16/2024: Prosthetic Care with left Transtibial  prosthesis and right blue rocker AFO: PT discussed appropriate ply sock wear at beginning of session and assessed patient's Lt LE within prosthesis at end of session and discussed requirement for adding socks for the remaining of the day.  PT discussed and educated on the physical and mental challenges following amputation.  PT educated patient on the progression from RW to no AD (RW to bilat canes to unilat cane to no AD). PT demonstrated 4-point ambulation with bilat canes using SPC with stand alone tips while verbalizing 1-2-3-4 and discussing appropriate placement of canes to assist with appropriate step length and foot placement. Patient then attempted to perform ambulation with this technique within parallel bars 1x down and back with close CGA from PT. Patient highly unstable with this technique.  Patient ambulated with RW and CGA for 89.5' and 67.5' with  narrow BOS, ER of bilat LEs, and increased hip flexion of Rt LE with Rt swing.   Neuro Re-Ed:  Patient performed parts of HEP at sink with CGA-mod A for review and to increase weightbearing, weight shifting, and stability. Patient performed fwd/back weight shifts 1x10 slowly and with intermittent requirement for UE use on sink and up to min A from PT secondary to fwd and posterior LOB.  Then patient performed lateral weight shifts 1x10 slowly and with intermittent UE use and min A secondary to LOB. Lastly, patient performed Lt weight shift with Rt step fwd/back with intermittent mod A secondary to LOB and multimodal cueing required for  appropriate form. Though patient required mod A for one instance of LOB, patient did perform an appropriate Lt stepping reaction during LOB. Patient would benefit from continued focus on static stability activities.  Patient performed Lt weight shift with Rt fwd/back step 1x5 with CGA with mirror use for visual feedback and bilat UE use for stability. Patient attempted to perform without UE use with high levels of instability.   Patient ambulated in parallel bars 1x with bilat UE use using mirror for visual feedback. Patient then decreased UE use to Rt UE only to simulate cane 1x3 down and back.  Patient ambulated fwd/back 1x2 down and back in parallel bars with bilat UE and CGA. Patient attempted to perform with unilat UE and without UE use, though showed high increases in instability. PT discussed change in balance strategies and proprioception following amputation.      HOME EXERCISE PROGRAM: Do each exercise 1-2  times per day Do each exercise 5-10 repetitions Hold each exercise for 2 seconds to feel your location  AT SINK FIND YOUR MIDLINE POSITION AND PLACE FEET EQUAL DISTANCE FROM THE MIDLINE.  Try to find this position when standing still for activities.   USE TAPE ON FLOOR TO MARK THE MIDLINE POSITION which is even with middle of sink.  You also should try  to feel with your limb pressure in socket.  You are trying to feel with limb what you used to feel with the bottom of your foot.  Side to Side Shift: Moving your hips only (not shoulders): move weight onto your left leg, HOLD/FEEL pressure in socket.  Move back to equal weight on each leg, HOLD/FEEL pressure in socket. Move weight onto your right leg, HOLD/FEEL pressure in socket. Move back to equal weight on each leg, HOLD/FEEL pressure in socket. Repeat.  Start with both hands on sink, progress to hand on prosthetic side only Front to Back Shift: Moving your hips only (not shoulders): move your weight forward onto your toes, HOLD/FEEL pressure in socket. Move your weight back to equal Flat Foot on both legs, HOLD/FEEL  pressure in socket. Move your weight back onto your heels, HOLD/FEEL  pressure in socket. Move your weight back to equal on both legs, HOLD/FEEL  pressure in socket. Repeat.  Start with both hands on sink, progress to hand on prosthetic side only Moving Cones / Cups: With equal weight on each leg: Hold on with one hand the first time, then progress to no hand supports. Move cups from one side of sink to the other. Place cups ~2" out of your reach, progress to 10" beyond reach.  Place one hand in middle of sink and reach with other hand. Do both arms.  Overhead/Upward Reaching: alternated reaching up to top cabinets or ceiling if no cabinets present. Keep equal weight on each leg. Start with one hand support on counter while other hand reaches and progress to no hand support with reaching.  ace one hand in middle of sink and reach with other hand. Do both arms.  5.   Looking Over Shoulders: With equal weight on each leg: alternate turning to look over your shoulders with one hand support on counter as needed.  Start with head motions only to look in front of shoulder, then even with shoulder and progress to looking behind you. To look to side, move head /eyes, then shoulder on side looking  pulls back, shift more weight to side looking and pull hip back. Place one hand in middle  of sink and let go with other hand so your shoulder can pull back. Switch hands to look other way.   6.  Stepping into lower cabinet:  Move items under cabinet out of your way. Shift your hips/pelvis so weight on prosthesis. Tighten muscles in hip on prosthetic side.  SLOWLY step other leg so front of foot is in cabinet. Then step back to floor. Repeat with other leg.    ASSESSMENT:  CLINICAL IMPRESSION: Patient arrived with rollator walker which PT instructed in safe utilization.  He appears to understand PT recommendations.  Pt met all STGs set for initial 30 days.  Patient continues to benefit from skilled PT.    OBJECTIVE IMPAIRMENTS: Abnormal gait, decreased activity tolerance, decreased balance, decreased endurance, decreased knowledge of condition, decreased knowledge of use of DME, decreased mobility, difficulty walking, decreased strength, increased edema, postural dysfunction, prosthetic dependency , and impaired skin integrity.   ACTIVITY LIMITATIONS: carrying, lifting, bending, sitting, standing, stairs, transfers, reach over head, and locomotion level  PARTICIPATION LIMITATIONS: meal prep, driving, shopping, community activity, and yard work  PERSONAL FACTORS: Fitness, Time since onset of injury/illness/exacerbation, and 3+ comorbidities: see PMH are also affecting patient's functional outcome.   REHAB POTENTIAL: Good  CLINICAL DECISION MAKING: Evolving/moderate complexity  EVALUATION COMPLEXITY: Moderate   GOALS: Goals reviewed with patient? Yes  SHORT TERM GOALS: Target date: 01/28/2024  Patient donnes prosthesis modified independent & verbalizes proper cleaning. Baseline: SEE OBJECTIVE DATA Goal status:   MET   01/28/2024 2.  Patient tolerates prosthesis >10 hrs total /day without skin issues or limb pain >5/10 after standing. Baseline: SEE OBJECTIVE DATA Goal status: MET  01/26/2024  3.  Patient able to reach 7, pick up object from floor and look over both shoulders with RW support with supervision. Baseline: SEE OBJECTIVE DATA Goal status: MET  01/28/2024  4. Patient ambulates 78' with RW & prosthesis with supervision. Baseline: SEE OBJECTIVE DATA Goal status: MET   01/28/2024  5. Patient negotiates ramps & curbs with RW & prosthesis with minA. Baseline: SEE OBJECTIVE DATA Goal status:  MET  01/19/2024  [place] TERM GOALS: Target date: 03/25/2024  Patient demonstrates & verbalized understanding of prosthetic care to enable safe utilization of prosthesis. Baseline: SEE OBJECTIVE DATA Goal status: Ongoing  01/28/2024  Patient tolerates prosthesis wear >90% of awake hours without skin or limb pain issues. Baseline: SEE OBJECTIVE DATA Goal status: Ongoing    01/28/2024  Berg Balance >30/56 to indicate lower fall risk Baseline: SEE OBJECTIVE DATA Goal status: Ongoing    01/28/2024  Patient ambulates >300' with prosthesis & LRAD independently Baseline: SEE OBJECTIVE DATA Goal status: Ongoing   01/28/2024  Patient negotiates ramps, curbs & stairs with single rail with prosthesis & LRAD independently. Baseline: SEE OBJECTIVE DATA Goal status: Ongoing    01/28/2024  Patient reports Patient-Specific Activity Score average to >/= 5 to indicate improvement in functional activities.   Baseline: SEE OBJECTIVE DATA Goal status: Ongoing   01/28/2024  PLAN:  PT FREQUENCY: 2x/week  PT DURATION: 12 weeks  PLANNED INTERVENTIONS: 97164- PT Re-evaluation, 97750- Physical Performance Testing, 97110-Therapeutic exercises, 97530- Therapeutic activity, 97112- Neuromuscular re-education, [phone]- Self Care, 02883- Gait training, [phone]- Prosthetic Initial , [phone]- Orthotic/Prosthetic subsequent, Patient/Family education, Balance training, Stair training, Scar mobilization, Vestibular training, and DME instructions  PLAN FOR NEXT SESSION:  leg press,  Continue  prosthetic gait with rollator or RW, prosthesis and AFO.  Standing balance activities.      Grayce Spatz, PT, DPT 01/28/2024, 2:07  PM

## 2024-02-02 ENCOUNTER — Ambulatory Visit: Admitting: Physical Therapy

## 2024-02-02 ENCOUNTER — Encounter: Payer: Self-pay | Admitting: Physical Therapy

## 2024-02-02 DIAGNOSIS — M21371 Foot drop, right foot: Secondary | ICD-10-CM | POA: Diagnosis not present

## 2024-02-02 DIAGNOSIS — L98498 Non-pressure chronic ulcer of skin of other sites with other specified severity: Secondary | ICD-10-CM

## 2024-02-02 DIAGNOSIS — R2681 Unsteadiness on feet: Secondary | ICD-10-CM

## 2024-02-02 DIAGNOSIS — R2689 Other abnormalities of gait and mobility: Secondary | ICD-10-CM | POA: Diagnosis not present

## 2024-02-02 DIAGNOSIS — M6281 Muscle weakness (generalized): Secondary | ICD-10-CM

## 2024-02-02 NOTE — Therapy (Signed)
 OUTPATIENT PHYSICAL THERAPY PROSTHETIC TREATMENT   Patient Name: Scott Navarro. MRN: [id number] DOB:1965-12-28, 58 y.o., male Today's Date: 02/02/2024  END OF SESSION:  PT End of Session - 02/02/24 1146     Visit Number 10    Number of Visits 25    Date for Recertification  03/25/24    Authorization Type [employer] other    Authorization - Visit Number 10    Authorization - Number of Visits 90    PT Start Time 1145    PT Stop Time 1228    PT Time Calculation (min) 43 min    Equipment Utilized During Treatment Gait belt    Activity Tolerance Patient tolerated treatment well    Behavior During Therapy WFL for tasks assessed/performed          Past Medical History:  Diagnosis Date   Type II diabetes mellitus (HCC) dx'd 10/08/2016   Past Surgical History:  Procedure Laterality Date   AMPUTATION Right 10/09/2016   Procedure: INCISION AND DRAINAGE RIGHT GREAT TOE;  Surgeon: Harden Jerona GAILS, MD;  Location: [facility] OR;  Service: Orthopedics;  Laterality: Right;   AMPUTATION Left 07/23/2023   Procedure: AMPUTATION, BELOW KNEE, LEFT;  Surgeon: Harden Jerona GAILS, MD;  Location: [facility] OR;  Service: Orthopedics;  Laterality: Left;  IRRIGATION AND DEBRIDEMENT AND AMPUTATION OF LEFT LEG   IRRIGATION AND DEBRIDEMENT KNEE Left 07/25/2023   Procedure: IRRIGATION AND DEBRIDEMENT LEFT KNEE;  Surgeon: Harden Jerona GAILS, MD;  Location: [facility] OR;  Service: Orthopedics;  Laterality: Left;  DEBRIDEMENT LEFT LEG   Patient Active Problem List   Diagnosis Date Noted   Coping style affecting medical condition 08/05/2023   S/P BKA (below knee amputation), left (HCC) 07/30/2023   ARF (acute renal failure) 07/23/2023   Hyperlipidemia 07/23/2023   Amputation below knee (HCC) 07/23/2023   Necrotizing fasciitis (HCC) 07/22/2023   Anemia 07/22/2023   Hyponatremia 07/22/2023   Diabetic retinopathy associated with type 2 diabetes mellitus (HCC) 02/18/2023   Charcot joint of left foot 02/18/2023   Encounter for lipid screening  for cardiovascular disease 02/18/2023   Screening for prostate cancer 02/18/2023   Type 2 diabetes mellitus with hyperglycemia (HCC) 11/28/2022   Gait instability 11/28/2022   Retinal hemorrhage 11/21/2022   Glaucoma 11/21/2022   Achilles tendon contracture, right 11/21/2016   Hypokalemia    Class 1 obesity due to excess calories with body mass index (BMI) of 33.0 to 33.9 in adult    Wound infection 10/09/2016   Hyperglycemia 10/09/2016   Elevated blood pressure reading 10/09/2016   Diabetic polyneuropathy associated with type 2 diabetes mellitus (HCC)    Laceration w FB of right great toe w/o damage to nail, init     PCP: Bulah Alm RAMAN, PA-C  REFERRING PROVIDER: Maurilio CHRISTELLA Collet, PA-C  ONSET DATE:  12/26/2023 prosthesis delivery  REFERRING DIAG: S10.487 (ICD-10-CM) - S/P BKA (below knee amputation), left   THERAPY DIAG:  Other abnormalities of gait and mobility  Unsteadiness on feet  Muscle weakness (generalized)  Foot drop, right  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  He has been working on adjusting ply socks.   PERTINENT HISTORY: left TTA 07/23/23, DM2, polyneuropathy, Acute renal failure, HLD  PAIN:  Are you having pain? No  PRECAUTIONS: Fall  RED FLAGS: None   WEIGHT BEARING RESTRICTIONS:  No  FALLS: Has patient fallen in last 6 months? No  LIVING ENVIRONMENT: Lives with: lives with spouse and mother with Alzheimer's. He is currently living with son who had recent medical issue making him bed / wheelchair bound.  His wife & he are son's caregivers but think is not permanent situation.   Lives in: House his house is 2 story development worker, community / bedroom main level) and son's house is single level (where they are currently staying).  Home Access: 3 steps to main entrance to  his home and son's house has ramp.  Stairs: Yes: Internal: 14 steps; on right going up and External: 3 steps; on left going up Has following equipment at home: Single point cane, Walker - 2 wheeled, Wheelchair (manual), Shower bench, and bed side commode  PLOF: Independent, Independent with household mobility without device, and Independent with community mobility without device  Occupation: retired  PATIENT GOALS: to use prosthesis normal activities: driving, shopping, yard work,  active at place in [employer],   OBJECTIVE:  Patient-Specific Activity Scoring Scheme  0 represents "unable to perform." 10 represents "able to perform at prior level. 0 1 2 3 4 5 6 7 8 9  10 (Date and Score)   Activity Eval 12/29/23    1. Care & wear of prosthesis  1    2. Standing ADLs   2    3. Walking  2   4. Outdoor activities 0   5.    Score 1.25    Total score = sum of the activity scores/number of activities Minimum detectable change (90%CI) for average score = 2 points Minimum detectable change (90%CI) for single activity score = 3 points  COGNITION: Evaluation on 12/29/2023: Overall cognitive status: Within functional limits for tasks assessed   SENSATION: Evaluation on 12/29/2023: [facility]  CARDIOVASCULAR RESPONSE: Evaluation on 12/29/2023: Functional activity: standing balance & gait Post-activity vitals: HR: 116 SpO2: 100% Modified Borg scale for dyspnea: 2: mild shortness of breath  POSTURE:  Evaluation on 12/29/2023: rounded shoulders, forward head, flexed trunk , and weight shift right  LOWER EXTREMITY ROM:  ROM P:passive  A:active Left Eval 12/29/23  Hip flexion   Hip extension   Hip abduction   Hip adduction   Hip internal rotation   Hip external rotation   Knee flexion   Knee extension 0*  Ankle dorsiflexion   Ankle plantarflexion   Ankle inversion   Ankle eversion    (Blank rows = not tested)  LOWER EXTREMITY MMT:  MMT Right Eval 12/29/23  Left Eval 12/2723  Hip flexion    Hip extension 3/5   Hip abduction 3/5   Hip adduction    Hip internal rotation    Hip external rotation    Knee flexion 3/5   Knee extension 3/5 4/5  Ankle dorsiflexion  2-/5  Ankle plantarflexion    Ankle inversion    Ankle eversion    At Evaluation all strength testing is grossly seated and functionally standing / gait. (Blank rows = not tested)  TRANSFERS: Evaluation on 12/29/2023: Sit to stand: SBA from 22 w/c requires use of armrests and back of legs against chair &/or RW for stabilization. Stand to sit: SBA RW to 22 w/c requires use of armrests and back of legs against chair &/or RW for stabilization.  FUNCTIONAL TESTs:  01/28/2024:  with locked rollator walker support & PT supervision: pt able to reach 7 anteriorly, pick up item from floor and look over his shoulders.  Evaluation  on 12/29/2023: Lars Balance 12/56  [facility] PT Assessment - 12/29/23 0900       Standardized Balance Assessment   Standardized Balance Assessment Berg Balance Test      Berg Balance Test   Sit to Stand Needs minimal aid to stand or to stabilize    Standing Unsupported Needs several tries to stand 30 seconds unsupported    Sitting with Back Unsupported but Feet Supported on Floor or Stool Able to sit safely and securely 2 minutes    Stand to Sit Controls descent by using hands    Transfers Able to transfer safely, definite need of hands    Standing Unsupported with Eyes Closed Needs help to keep from falling    Standing Unsupported with Feet Together Needs help to attain position and unable to hold for 15 seconds    From Standing, Reach Forward with Outstretched Arm Loses balance while trying/requires external support    From Standing Position, Pick up Object from Floor Unable to try/needs assist to keep balance    From Standing Position, Turn to Look Behind Over each Shoulder Needs assist to keep from losing balance and falling    Turn 360 Degrees Needs  assistance while turning    Standing Unsupported, Alternately Place Feet on Step/Stool Needs assistance to keep from falling or unable to try    Standing Unsupported, One Foot in [employer] balance while stepping or standing    Standing on One Leg Unable to try or needs assist to prevent fall    Total Score 12    Berg comment: < 36 high risk for falls (close to 100%) 46-51 moderate (>50%)   37-45 significant (>80%) 52-55 lower (> 25%)          GAIT: 01/28/2024: Pt amb 150' with rollator walker with supervision.  PT neg ramp & curb with rollator walker with min/CGA.  Evaluation on 12/29/2023: Distance walked: 60' Assistive device utilized: [employer] - 2 wheeled and TTA prosthesis Level of assistance: Min A (multiple balance losses requiring PT assist to stabilize especially in turns) Gait pattern: step through pattern, decreased step length- Right, decreased stance time- Left, decreased hip/knee flexion- Left, decreased ankle dorsiflexion- Right, Right hip hike, Left hip hike, genu recurvatum- Right, antalgic, trendelenburg, lateral hip instability, trunk flexed, and poor foot clearance- Right, left knee instability / flexion moment intermittently  Gait velocity:  0.97 ft/sec Comments: excess UE weight bearing on RW.    CURRENT PROSTHETIC WEAR ASSESSMENT: Evaluation on 12/29/2023:  Patient is dependent with: skin check, residual limb care, care of non-amputated limb, prosthetic cleaning, ply sock cleaning, correct ply sock adjustment, proper wear schedule/adjustment, and proper weight-bearing schedule/adjustment Donning prosthesis: SBA Doffing prosthesis: SBA Prosthetic wear tolerance: up to 1.5 hours, 2x/day, 2 of 3 days since delivery Prosthetic weight bearing tolerance: 5 minutes with no c/o residual limb pain Edema: pitting Residual limb condition: cylindrical shape, 3 cm superficial scab at anterior-lateral limb with no signs of infection, small wound on lateral limb proximal to  fibular head with white covering 0.4 cm, invaginated scar lateral aspect, dry flaking skin, no hair growth, redness over patella (friction from liner when seated), normal temperature.  Prosthetic description: silicon (9 mm anterior portion) with pin lock suspension, total contact socket with flexible inner socket.    TODAY'S TREATMENT:  DATE:  02/02/2024: Prosthetic Care with left Transtibial  prosthesis and right blue rocker AFO:  Pt amb 150' x 2 with rollator walker with supervision.  PT neg ramp & curb with rollator walker with SBA.  Therapeutic Activities:  Leg press BLEs 112# 15 reps 2 sets;  single LE - 10 reps 2 sets RLE 56# & LLE 50# (had to reduce wt to 37# for last 4 reps) PT educated pt with verbal cues while pt performing simulated driving.  Pt verbalized understanding.    Neuromuscular Re-education: working on standing balance  Standing UE resistance - single UE 10 reps ea UE with contralateral UE support on locked rollator, 2nd set single UE without UE support with PT minA/tactile cues, then 10 reps BUEs  without UE support with PT minA/tactile cues.  Green theraband.      TREATMENT:                                                                                                                             DATE:  01/28/2024: Prosthetic Care with left Transtibial  prosthesis and right blue rocker AFO: PT demo & verbal cues on rollator walker safety & use.  PT demo & verbal cues on sit to/from stand to rollator from standard chair, sit to/from stand to rollator seat, proper hand position on brakes, proper location of his body/feet to rollator while ambulating, turning with rollator walker and negotiating ramp & curb.  Patient performed all with PT in clinic and verbalized better understanding.   Pt amb 150' with rollator walker with supervision.  PT neg ramp &  curb with rollator walker with min/CGA. Pt able to sit on rollator seat safely and stand up with one hand on handle & other lower point where back support attaches.    Therapeutic Activities:  Leg press BLEs 106# 10 reps 2 sets;  single LE - 10 reps 2 sets RLE 43# & LLE 37# See objective data  Neuromuscular Re-education: working on standing balance  Standing in corner with chair back in front:  Wide stance on floor eyes open head turns right/left, up/down & diagonals with frequent touch walls or chair.  Cues for stabilizing with equal wt BLEs at midpoint of each motion.   Wide stance finger tip support on side of chair (limit weight bearing thru UEs) on floor eyes open head turns right/left, up/down & diagonals with frequent touch walls or chair.  Cues for stabilizing with equal wt BLEs at midpoint of each motion.      TREATMENT:  DATE:  01/26/2024: Prosthetic Care with left Transtibial  prosthesis and right blue rocker AFO: PT discussed importance of performing stool activities at home to increase time on prosthesis as well as increasing standing endurance.  PT discussed importance of safety when ambulating within the home to include use of RW and how wall-walking/counter surfing isn't a safe option.   Neuro Re-Ed: all performed with RW in front and MWC behind  Patient performed square balance with tape on floor. Patient originally attempted to perform without RW and CGA only, though highly unstable and requiring min A and RW to stabilize. Patient then performed 3x with RW and CGA, however, patient requiring several verbal cues for appropriate form and to decrease step size.  Patient performed static balance activities with CGA-max A.  Narrow stance balance 2x30s with intermittent increase of assist to min A and intermittent use of RW for stability with LOB Typical  stance with head turns  Horizontal 1x14 with intermittent LOB requiring increase assist to min A and intermittent use of RW for stability with LOB Vertical 1x5 with large LOB requiring increased assist to mod-max A and intermittent use of RW for stability  Horizontal 1x20 with intermittent increase assist to min A, though able to mostly maintain CGA Up to center 1x10 with intermittent increase assist to min A and use of RW for stability  Down to center with intermittent increase assist to min A and use of RW for stability  Staggered stance 1x30s each side  with intermittent min-mod A with Lt LE back  mod-max A with Rt LE back for most of 30s  PT educating patient on the three balance systems (visual, proprioception, and vestibular) as well as the different balance strategies required to self-recover from any LOB.    TREATMENT:                                                                                                                             DATE:  01/21/2024: Prosthetic Care with left Transtibial  prosthesis and right blue rocker AFO: Patient ambulated 140' & 100' with RW and CGA to SBA with verbal & visual cues for step width and upright posture.   Pt neg ramp with RW with minA with PT verbal cues on technique especially ascending to limit poor knee control.  Pt neg curb with RW with minA with cues on technique.  Residual limb has no opening but skin chafing distal portion.  PT reviewed adjusting ply socks as too few can be cause of distal pressure.  PT recommended increasing wear from arising to bedtime except 2 hours mid-day. Pat Dry limb/liner half way of both wears.  PT educated on using prosthesis & AFO to enter/exit bathroom.  Pt verbalized understanding.    Therapeutic Activities: PT demo & verbal cues on using bar stool for standing ADLs including sit to/from stand.  Pt able to sit / stand on 24 stool safely.  Pt able to sit  on 24 or 29 stool with feet on floor and buttocks  on seat.  PT educated on performing ADLS so BUE free and UEs / vision closer to standing.  Pt verbalized understanding.   Leg press BLEs 81# 10 reps 2 sets;  single LE RLE 43# 10 reps and LLE 37# 10 reps.   Standing back extension with CGA - forward lean placing hands on chair and upright 5 sec for 10 reps.      HOME EXERCISE PROGRAM: Do each exercise 1-2  times per day Do each exercise 5-10 repetitions Hold each exercise for 2 seconds to feel your location  AT SINK FIND YOUR MIDLINE POSITION AND PLACE FEET EQUAL DISTANCE FROM THE MIDLINE.  Try to find this position when standing still for activities.   USE TAPE ON FLOOR TO MARK THE MIDLINE POSITION which is even with middle of sink.  You also should try to feel with your limb pressure in socket.  You are trying to feel with limb what you used to feel with the bottom of your foot.  Side to Side Shift: Moving your hips only (not shoulders): move weight onto your left leg, HOLD/FEEL pressure in socket.  Move back to equal weight on each leg, HOLD/FEEL pressure in socket. Move weight onto your right leg, HOLD/FEEL pressure in socket. Move back to equal weight on each leg, HOLD/FEEL pressure in socket. Repeat.  Start with both hands on sink, progress to hand on prosthetic side only Front to Back Shift: Moving your hips only (not shoulders): move your weight forward onto your toes, HOLD/FEEL pressure in socket. Move your weight back to equal Flat Foot on both legs, HOLD/FEEL  pressure in socket. Move your weight back onto your heels, HOLD/FEEL  pressure in socket. Move your weight back to equal on both legs, HOLD/FEEL  pressure in socket. Repeat.  Start with both hands on sink, progress to hand on prosthetic side only Moving Cones / Cups: With equal weight on each leg: Hold on with one hand the first time, then progress to no hand supports. Move cups from one side of sink to the other. Place cups ~2" out of your reach, progress to 10" beyond reach.   Place one hand in middle of sink and reach with other hand. Do both arms.  Overhead/Upward Reaching: alternated reaching up to top cabinets or ceiling if no cabinets present. Keep equal weight on each leg. Start with one hand support on counter while other hand reaches and progress to no hand support with reaching.  ace one hand in middle of sink and reach with other hand. Do both arms.  5.   Looking Over Shoulders: With equal weight on each leg: alternate turning to look over your shoulders with one hand support on counter as needed.  Start with head motions only to look in front of shoulder, then even with shoulder and progress to looking behind you. To look to side, move head /eyes, then shoulder on side looking pulls back, shift more weight to side looking and pull hip back. Place one hand in middle of sink and let go with other hand so your shoulder can pull back. Switch hands to look other way.   6.  Stepping into lower cabinet:  Move items under cabinet out of your way. Shift your hips/pelvis so weight on prosthesis. Tighten muscles in hip on prosthetic side.  SLOWLY step other leg so front of foot is in cabinet. Then step back to floor. Repeat  with other leg.    ASSESSMENT:  CLINICAL IMPRESSION: Patient appeared safe with rollator walker for gait today.  He is reporting improvement with gait & activities at home.  Pt was able to use higher weight on leg press today.  Standing UE resistance exercises challenged him but helped facilitate standing balance.    Patient continues to benefit from skilled PT.    OBJECTIVE IMPAIRMENTS: Abnormal gait, decreased activity tolerance, decreased balance, decreased endurance, decreased knowledge of condition, decreased knowledge of use of DME, decreased mobility, difficulty walking, decreased strength, increased edema, postural dysfunction, prosthetic dependency , and impaired skin integrity.   ACTIVITY LIMITATIONS: carrying, lifting, bending, sitting,  standing, stairs, transfers, reach over head, and locomotion level  PARTICIPATION LIMITATIONS: meal prep, driving, shopping, community activity, and yard work  PERSONAL FACTORS: Fitness, Time since onset of injury/illness/exacerbation, and 3+ comorbidities: see PMH are also affecting patient's functional outcome.   REHAB POTENTIAL: Good  CLINICAL DECISION MAKING: Evolving/moderate complexity  EVALUATION COMPLEXITY: Moderate   GOALS: Goals reviewed with patient? Yes  SHORT TERM GOALS: Target date: 01/28/2024  Patient donnes prosthesis modified independent & verbalizes proper cleaning. Baseline: SEE OBJECTIVE DATA Goal status:   MET   01/28/2024 2.  Patient tolerates prosthesis >10 hrs total /day without skin issues or limb pain >5/10 after standing. Baseline: SEE OBJECTIVE DATA Goal status: MET 01/26/2024  3.  Patient able to reach 7, pick up object from floor and look over both shoulders with RW support with supervision. Baseline: SEE OBJECTIVE DATA Goal status: MET  01/28/2024  4. Patient ambulates 35' with RW & prosthesis with supervision. Baseline: SEE OBJECTIVE DATA Goal status: MET   01/28/2024  5. Patient negotiates ramps & curbs with RW & prosthesis with minA. Baseline: SEE OBJECTIVE DATA Goal status:  MET  01/19/2024  [place] TERM GOALS: Target date: 03/25/2024  Patient demonstrates & verbalized understanding of prosthetic care to enable safe utilization of prosthesis. Baseline: SEE OBJECTIVE DATA Goal status: Ongoing  02/02/2024  Patient tolerates prosthesis wear >90% of awake hours without skin or limb pain issues. Baseline: SEE OBJECTIVE DATA Goal status: Ongoing   02/02/2024  Berg Balance >30/56 to indicate lower fall risk Baseline: SEE OBJECTIVE DATA Goal status: Ongoing   02/02/2024  Patient ambulates >300' with prosthesis & LRAD independently Baseline: SEE OBJECTIVE DATA Goal status: Ongoing  02/02/2024  Patient negotiates ramps, curbs & stairs with  single rail with prosthesis & LRAD independently. Baseline: SEE OBJECTIVE DATA Goal status: Ongoing   02/02/2024  Patient reports Patient-Specific Activity Score average to >/= 5 to indicate improvement in functional activities.   Baseline: SEE OBJECTIVE DATA Goal status: Ongoing   02/02/2024  PLAN:  PT FREQUENCY: 2x/week  PT DURATION: 12 weeks  PLANNED INTERVENTIONS: 97164- PT Re-evaluation, 97750- Physical Performance Testing, 97110-Therapeutic exercises, 97530- Therapeutic activity, [id number]- Neuromuscular re-education, [phone]- Self Care, 02883- Gait training, [phone]- Prosthetic Initial , [phone]- Orthotic/Prosthetic subsequent, Patient/Family education, Balance training, Stair training, Scar mobilization, Vestibular training, and DME instructions  PLAN FOR NEXT SESSION: continue with leg press,  Continue prosthetic gait with rollator or RW, prosthesis and AFO.  Standing balance activities.      Cashe Gatt, PT, DPT 02/02/2024, 1:00 PM

## 2024-02-03 ENCOUNTER — Ambulatory Visit

## 2024-02-03 DIAGNOSIS — M6281 Muscle weakness (generalized): Secondary | ICD-10-CM

## 2024-02-03 DIAGNOSIS — R2689 Other abnormalities of gait and mobility: Secondary | ICD-10-CM

## 2024-02-03 DIAGNOSIS — L98498 Non-pressure chronic ulcer of skin of other sites with other specified severity: Secondary | ICD-10-CM

## 2024-02-03 DIAGNOSIS — R2681 Unsteadiness on feet: Secondary | ICD-10-CM | POA: Diagnosis not present

## 2024-02-03 DIAGNOSIS — M21371 Foot drop, right foot: Secondary | ICD-10-CM

## 2024-02-03 NOTE — Therapy (Signed)
 OUTPATIENT PHYSICAL THERAPY PROSTHETIC TREATMENT   Patient Name: Scott Navarro. MRN: [id number] DOB:04-Mar-1966, 58 y.o., male Today's Date: 02/03/2024  END OF SESSION:  PT End of Session - 02/03/24 1444     Visit Number 11    Number of Visits 25    Date for Recertification  03/25/24    Authorization Type [employer] other    Authorization - Visit Number 11    Authorization - Number of Visits 90    PT Start Time 1444    PT Stop Time 1523    PT Time Calculation (min) 39 min    Equipment Utilized During Treatment Gait belt    Activity Tolerance Patient tolerated treatment well    Behavior During Therapy WFL for tasks assessed/performed           Past Medical History:  Diagnosis Date   Type II diabetes mellitus (HCC) dx'd 10/08/2016   Past Surgical History:  Procedure Laterality Date   AMPUTATION Right 10/09/2016   Procedure: INCISION AND DRAINAGE RIGHT GREAT TOE;  Surgeon: Harden Jerona GAILS, MD;  Location: [facility] OR;  Service: Orthopedics;  Laterality: Right;   AMPUTATION Left 07/23/2023   Procedure: AMPUTATION, BELOW KNEE, LEFT;  Surgeon: Harden Jerona GAILS, MD;  Location: [facility] OR;  Service: Orthopedics;  Laterality: Left;  IRRIGATION AND DEBRIDEMENT AND AMPUTATION OF LEFT LEG   IRRIGATION AND DEBRIDEMENT KNEE Left 07/25/2023   Procedure: IRRIGATION AND DEBRIDEMENT LEFT KNEE;  Surgeon: Harden Jerona GAILS, MD;  Location: [facility] OR;  Service: Orthopedics;  Laterality: Left;  DEBRIDEMENT LEFT LEG   Patient Active Problem List   Diagnosis Date Noted   Coping style affecting medical condition 08/05/2023   S/P BKA (below knee amputation), left (HCC) 07/30/2023   ARF (acute renal failure) 07/23/2023   Hyperlipidemia 07/23/2023   Amputation below knee (HCC) 07/23/2023   Necrotizing fasciitis (HCC) 07/22/2023   Anemia 07/22/2023   Hyponatremia 07/22/2023   Diabetic retinopathy associated with type 2 diabetes mellitus (HCC) 02/18/2023   Charcot joint of left foot 02/18/2023   Encounter for lipid  screening for cardiovascular disease 02/18/2023   Screening for prostate cancer 02/18/2023   Type 2 diabetes mellitus with hyperglycemia (HCC) 11/28/2022   Gait instability 11/28/2022   Retinal hemorrhage 11/21/2022   Glaucoma 11/21/2022   Achilles tendon contracture, right 11/21/2016   Hypokalemia    Class 1 obesity due to excess calories with body mass index (BMI) of 33.0 to 33.9 in adult    Wound infection 10/09/2016   Hyperglycemia 10/09/2016   Elevated blood pressure reading 10/09/2016   Diabetic polyneuropathy associated with type 2 diabetes mellitus (HCC)    Laceration w FB of right great toe w/o damage to nail, init     PCP: Bulah Alm RAMAN, PA-C  REFERRING PROVIDER: Maurilio CHRISTELLA Collet, PA-C  ONSET DATE:  12/26/2023 prosthesis delivery  REFERRING DIAG: S10.487 (ICD-10-CM) - S/P BKA (below knee amputation), left   THERAPY DIAG:  Other abnormalities of gait and mobility  Unsteadiness on feet  Muscle weakness (generalized)  Foot drop, right  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 Patient endorses that it was a little difficult to put on after that, but is not having any pain.   PERTINENT HISTORY: left TTA 07/23/23, DM2, polyneuropathy, Acute renal  failure, HLD  PAIN:  Are you having pain? No  PRECAUTIONS: Fall  RED FLAGS: None   WEIGHT BEARING RESTRICTIONS: No  FALLS: Has patient fallen in last 6 months? No  LIVING ENVIRONMENT: Lives with: lives with spouse and mother with Alzheimer's. He is currently living with son who had recent medical issue making him bed / wheelchair bound.  His wife & he are son's caregivers but think is not permanent situation.   Lives in: House his house is 2 story development worker, community / bedroom main  level) and son's house is single level (where they are currently staying).  Home Access: 3 steps to main entrance to his home and son's house has ramp.  Stairs: Yes: Internal: 14 steps; on right going up and External: 3 steps; on left going up Has following equipment at home: Single point cane, Walker - 2 wheeled, Wheelchair (manual), Shower bench, and bed side commode  PLOF: Independent, Independent with household mobility without device, and Independent with community mobility without device  Occupation: retired  PATIENT GOALS: to use prosthesis normal activities: driving, shopping, yard work,  active at place in [employer],   OBJECTIVE:  Patient-Specific Activity Scoring Scheme  0 represents "unable to perform." 10 represents "able to perform at prior level. 0 1 2 3 4 5 6 7 8 9  10 (Date and Score)   Activity Eval 12/29/23    1. Care & wear of prosthesis  1    2. Standing ADLs   2    3. Walking  2   4. Outdoor activities 0   5.    Score 1.25    Total score = sum of the activity scores/number of activities Minimum detectable change (90%CI) for average score = 2 points Minimum detectable change (90%CI) for single activity score = 3 points  COGNITION: Evaluation on 12/29/2023: Overall cognitive status: Within functional limits for tasks assessed   SENSATION: Evaluation on 12/29/2023: [facility]  CARDIOVASCULAR RESPONSE: Evaluation on 12/29/2023: Functional activity: standing balance & gait Post-activity vitals: HR: 116 SpO2: 100% Modified Borg scale for dyspnea: 2: mild shortness of breath  POSTURE:  Evaluation on 12/29/2023: rounded shoulders, forward head, flexed trunk , and weight shift right  LOWER EXTREMITY ROM:  ROM P:passive  A:active Left Eval 12/29/23  Hip flexion   Hip extension   Hip abduction   Hip adduction   Hip internal rotation   Hip external rotation   Knee flexion   Knee extension 0*  Ankle dorsiflexion   Ankle plantarflexion    Ankle inversion   Ankle eversion    (Blank rows = not tested)  LOWER EXTREMITY MMT:  MMT Right Eval 12/29/23 Left Eval 12/2723  Hip flexion    Hip extension 3/5   Hip abduction 3/5   Hip adduction    Hip internal rotation    Hip external rotation    Knee flexion 3/5   Knee extension 3/5 4/5  Ankle dorsiflexion  2-/5  Ankle plantarflexion    Ankle inversion    Ankle eversion    At Evaluation all strength testing is grossly seated and functionally standing / gait. (Blank rows = not tested)  TRANSFERS: Evaluation on 12/29/2023: Sit to stand: SBA from 22 w/c requires use of armrests and back of legs against chair &/or RW for stabilization. Stand to sit: SBA RW to 22 w/c requires use of armrests and back of legs against chair &/or RW for stabilization.  FUNCTIONAL TESTs:  01/28/2024:  with locked rollator walker  support & PT supervision: pt able to reach 7 anteriorly, pick up item from floor and look over his shoulders.  Evaluation on 12/29/2023: Lars Balance 12/56  [facility] PT Assessment - 12/29/23 0900       Standardized Balance Assessment   Standardized Balance Assessment Berg Balance Test      Berg Balance Test   Sit to Stand Needs minimal aid to stand or to stabilize    Standing Unsupported Needs several tries to stand 30 seconds unsupported    Sitting with Back Unsupported but Feet Supported on Floor or Stool Able to sit safely and securely 2 minutes    Stand to Sit Controls descent by using hands    Transfers Able to transfer safely, definite need of hands    Standing Unsupported with Eyes Closed Needs help to keep from falling    Standing Unsupported with Feet Together Needs help to attain position and unable to hold for 15 seconds    From Standing, Reach Forward with Outstretched Arm Loses balance while trying/requires external support    From Standing Position, Pick up Object from Floor Unable to try/needs assist to keep balance    From Standing Position,  Turn to Look Behind Over each Shoulder Needs assist to keep from losing balance and falling    Turn 360 Degrees Needs assistance while turning    Standing Unsupported, Alternately Place Feet on Step/Stool Needs assistance to keep from falling or unable to try    Standing Unsupported, One Foot in [employer] balance while stepping or standing    Standing on One Leg Unable to try or needs assist to prevent fall    Total Score 12    Berg comment: < 36 high risk for falls (close to 100%) 46-51 moderate (>50%)   37-45 significant (>80%) 52-55 lower (> 25%)          GAIT: 01/28/2024: Pt amb 150' with rollator walker with supervision.  PT neg ramp & curb with rollator walker with min/CGA.  Evaluation on 12/29/2023: Distance walked: 60' Assistive device utilized: [employer] - 2 wheeled and TTA prosthesis Level of assistance: Min A (multiple balance losses requiring PT assist to stabilize especially in turns) Gait pattern: step through pattern, decreased step length- Right, decreased stance time- Left, decreased hip/knee flexion- Left, decreased ankle dorsiflexion- Right, Right hip hike, Left hip hike, genu recurvatum- Right, antalgic, trendelenburg, lateral hip instability, trunk flexed, and poor foot clearance- Right, left knee instability / flexion moment intermittently  Gait velocity:  0.97 ft/sec Comments: excess UE weight bearing on RW.    CURRENT PROSTHETIC WEAR ASSESSMENT: Evaluation on 12/29/2023:  Patient is dependent with: skin check, residual limb care, care of non-amputated limb, prosthetic cleaning, ply sock cleaning, correct ply sock adjustment, proper wear schedule/adjustment, and proper weight-bearing schedule/adjustment Donning prosthesis: SBA Doffing prosthesis: SBA Prosthetic wear tolerance: up to 1.5 hours, 2x/day, 2 of 3 days since delivery Prosthetic weight bearing tolerance: 5 minutes with no c/o residual limb pain Edema: pitting Residual limb condition: cylindrical shape,  3 cm superficial scab at anterior-lateral limb with no signs of infection, small wound on lateral limb proximal to fibular head with white covering 0.4 cm, invaginated scar lateral aspect, dry flaking skin, no hair growth, redness over patella (friction from liner when seated), normal temperature.  Prosthetic description: silicon (9 mm anterior portion) with pin lock suspension, total contact socket with flexible inner socket.    TODAY'S TREATMENT:  DATE:  02/03/2024: Prosthetic Care with left transtiial prosthesis and right blue rocker AFO  PT and patient discussed ply sock wear and appropriate use as well as how to use tall kitchen chairs as the barstool when performing tasks within the home.  Patient with improved ambulation mechanics with rollator and controlled sit<>stands this date.  Neuro Re-Ed: rollator placed to Lt side, chair behind patient, and PT providing at least CGA throughout  Standing balance while reaching to light up pods on window  1x30s using Rt UE only while keeping Lt UE on rollator for stability; no LOB or increase in required assist  2x30s using Rt UE only with no UE use on rollator for stability; intermittent increase of assist from PT to mod A for posterior LOB; patient able to self-correct lateral and fwd LOB with stepping reaction or UE use on window/rollator  2x30s using Lt UE only with no UE use on rollator for stability; intermittent increase of assist from PT to mod A for posterior LOB; patient able to self-correct lateral and fwd LOB with stepping reaction or UE use on window/rollator 2x30s using bilat UE for reaching with no UE use on rollator for stability; intermittent increase of assist from PT to mod A for posterior LOB; patient able to self-correct lateral and fwd LOB with stepping reaction or UE use on window/rollator  Patient continued to  improve upon balance strategies with increased time and repetitions   TherAct: Bilat leg press 2x15 with 112#  Unilat leg press Rt LE 1x10 with 50#, Lt LE 1x8 with 31# Highly fatigued as he did leg press at yesterday's session    TODAY'S TREATMENT:                                                                                                                             DATE:  02/02/2024: Prosthetic Care with left Transtibial  prosthesis and right blue rocker AFO:  Pt amb 150' x 2 with rollator walker with supervision.  PT neg ramp & curb with rollator walker with SBA.  Therapeutic Activities:  Leg press BLEs 112# 15 reps 2 sets;  single LE - 10 reps 2 sets RLE 56# & LLE 50# (had to reduce wt to 37# for last 4 reps) PT educated pt with verbal cues while pt performing simulated driving.  Pt verbalized understanding.    Neuromuscular Re-education: working on standing balance  Standing UE resistance - single UE 10 reps ea UE with contralateral UE support on locked rollator, 2nd set single UE without UE support with PT minA/tactile cues, then 10 reps BUEs  without UE support with PT minA/tactile cues.  Green theraband.      TREATMENT:  DATE:  01/28/2024: Prosthetic Care with left Transtibial  prosthesis and right blue rocker AFO: PT demo & verbal cues on rollator walker safety & use.  PT demo & verbal cues on sit to/from stand to rollator from standard chair, sit to/from stand to rollator seat, proper hand position on brakes, proper location of his body/feet to rollator while ambulating, turning with rollator walker and negotiating ramp & curb.  Patient performed all with PT in clinic and verbalized better understanding.   Pt amb 150' with rollator walker with supervision.  PT neg ramp & curb with rollator walker with min/CGA. Pt able to sit on rollator seat safely and  stand up with one hand on handle & other lower point where back support attaches.    Therapeutic Activities:  Leg press BLEs 106# 10 reps 2 sets;  single LE - 10 reps 2 sets RLE 43# & LLE 37# See objective data  Neuromuscular Re-education: working on standing balance  Standing in corner with chair back in front:  Wide stance on floor eyes open head turns right/left, up/down & diagonals with frequent touch walls or chair.  Cues for stabilizing with equal wt BLEs at midpoint of each motion.   Wide stance finger tip support on side of chair (limit weight bearing thru UEs) on floor eyes open head turns right/left, up/down & diagonals with frequent touch walls or chair.  Cues for stabilizing with equal wt BLEs at midpoint of each motion.      TREATMENT:                                                                                                                             DATE:  01/26/2024: Prosthetic Care with left Transtibial  prosthesis and right blue rocker AFO: PT discussed importance of performing stool activities at home to increase time on prosthesis as well as increasing standing endurance.  PT discussed importance of safety when ambulating within the home to include use of RW and how wall-walking/counter surfing isn't a safe option.   Neuro Re-Ed: all performed with RW in front and MWC behind  Patient performed square balance with tape on floor. Patient originally attempted to perform without RW and CGA only, though highly unstable and requiring min A and RW to stabilize. Patient then performed 3x with RW and CGA, however, patient requiring several verbal cues for appropriate form and to decrease step size.  Patient performed static balance activities with CGA-max A.  Narrow stance balance 2x30s with intermittent increase of assist to min A and intermittent use of RW for stability with LOB Typical stance with head turns  Horizontal 1x14 with intermittent LOB requiring increase  assist to min A and intermittent use of RW for stability with LOB Vertical 1x5 with large LOB requiring increased assist to mod-max A and intermittent use of RW for stability  Horizontal 1x20 with intermittent increase assist to min A, though able to mostly maintain CGA Up to center  1x10 with intermittent increase assist to min A and use of RW for stability  Down to center with intermittent increase assist to min A and use of RW for stability  Staggered stance 1x30s each side  with intermittent min-mod A with Lt LE back  mod-max A with Rt LE back for most of 30s  PT educating patient on the three balance systems (visual, proprioception, and vestibular) as well as the different balance strategies required to self-recover from any LOB.     HOME EXERCISE PROGRAM: Do each exercise 1-2  times per day Do each exercise 5-10 repetitions Hold each exercise for 2 seconds to feel your location  AT SINK FIND YOUR MIDLINE POSITION AND PLACE FEET EQUAL DISTANCE FROM THE MIDLINE.  Try to find this position when standing still for activities.   USE TAPE ON FLOOR TO MARK THE MIDLINE POSITION which is even with middle of sink.  You also should try to feel with your limb pressure in socket.  You are trying to feel with limb what you used to feel with the bottom of your foot.  Side to Side Shift: Moving your hips only (not shoulders): move weight onto your left leg, HOLD/FEEL pressure in socket.  Move back to equal weight on each leg, HOLD/FEEL pressure in socket. Move weight onto your right leg, HOLD/FEEL pressure in socket. Move back to equal weight on each leg, HOLD/FEEL pressure in socket. Repeat.  Start with both hands on sink, progress to hand on prosthetic side only Front to Back Shift: Moving your hips only (not shoulders): move your weight forward onto your toes, HOLD/FEEL pressure in socket. Move your weight back to equal Flat Foot on both legs, HOLD/FEEL  pressure in socket. Move your weight back onto  your heels, HOLD/FEEL  pressure in socket. Move your weight back to equal on both legs, HOLD/FEEL  pressure in socket. Repeat.  Start with both hands on sink, progress to hand on prosthetic side only Moving Cones / Cups: With equal weight on each leg: Hold on with one hand the first time, then progress to no hand supports. Move cups from one side of sink to the other. Place cups ~2" out of your reach, progress to 10" beyond reach.  Place one hand in middle of sink and reach with other hand. Do both arms.  Overhead/Upward Reaching: alternated reaching up to top cabinets or ceiling if no cabinets present. Keep equal weight on each leg. Start with one hand support on counter while other hand reaches and progress to no hand support with reaching.  ace one hand in middle of sink and reach with other hand. Do both arms.  5.   Looking Over Shoulders: With equal weight on each leg: alternate turning to look over your shoulders with one hand support on counter as needed.  Start with head motions only to look in front of shoulder, then even with shoulder and progress to looking behind you. To look to side, move head /eyes, then shoulder on side looking pulls back, shift more weight to side looking and pull hip back. Place one hand in middle of sink and let go with other hand so your shoulder can pull back. Switch hands to look other way.   6.  Stepping into lower cabinet:  Move items under cabinet out of your way. Shift your hips/pelvis so weight on prosthesis. Tighten muscles in hip on prosthetic side.  SLOWLY step other leg so front of foot is in cabinet. Then  step back to floor. Repeat with other leg.    ASSESSMENT:  CLINICAL IMPRESSION: Patient arrived to session noting improved abilities around the house with rollator and with articulating ankle. Patient tolerated all activities well, though was highly fatigued toward the end of the session as he had two sessions within 24 hours. Patient will continue to  benefit from skilled PT.  OBJECTIVE IMPAIRMENTS: Abnormal gait, decreased activity tolerance, decreased balance, decreased endurance, decreased knowledge of condition, decreased knowledge of use of DME, decreased mobility, difficulty walking, decreased strength, increased edema, postural dysfunction, prosthetic dependency , and impaired skin integrity.   ACTIVITY LIMITATIONS: carrying, lifting, bending, sitting, standing, stairs, transfers, reach over head, and locomotion level  PARTICIPATION LIMITATIONS: meal prep, driving, shopping, community activity, and yard work  PERSONAL FACTORS: Fitness, Time since onset of injury/illness/exacerbation, and 3+ comorbidities: see PMH are also affecting patient's functional outcome.   REHAB POTENTIAL: Good  CLINICAL DECISION MAKING: Evolving/moderate complexity  EVALUATION COMPLEXITY: Moderate   GOALS: Goals reviewed with patient? Yes  SHORT TERM GOALS: Target date: 01/28/2024  Patient donnes prosthesis modified independent & verbalizes proper cleaning. Baseline: SEE OBJECTIVE DATA Goal status:   MET   01/28/2024 2.  Patient tolerates prosthesis >10 hrs total /day without skin issues or limb pain >5/10 after standing. Baseline: SEE OBJECTIVE DATA Goal status: MET 01/26/2024  3.  Patient able to reach 7, pick up object from floor and look over both shoulders with RW support with supervision. Baseline: SEE OBJECTIVE DATA Goal status: MET  01/28/2024  4. Patient ambulates 47' with RW & prosthesis with supervision. Baseline: SEE OBJECTIVE DATA Goal status: MET   01/28/2024  5. Patient negotiates ramps & curbs with RW & prosthesis with minA. Baseline: SEE OBJECTIVE DATA Goal status:  MET  01/19/2024  [place] TERM GOALS: Target date: 03/25/2024  Patient demonstrates & verbalized understanding of prosthetic care to enable safe utilization of prosthesis. Baseline: SEE OBJECTIVE DATA Goal status: Ongoing  02/02/2024  Patient tolerates  prosthesis wear >90% of awake hours without skin or limb pain issues. Baseline: SEE OBJECTIVE DATA Goal status: Ongoing   02/02/2024  Berg Balance >30/56 to indicate lower fall risk Baseline: SEE OBJECTIVE DATA Goal status: Ongoing   02/02/2024  Patient ambulates >300' with prosthesis & LRAD independently Baseline: SEE OBJECTIVE DATA Goal status: Ongoing  02/02/2024  Patient negotiates ramps, curbs & stairs with single rail with prosthesis & LRAD independently. Baseline: SEE OBJECTIVE DATA Goal status: Ongoing   02/02/2024  Patient reports Patient-Specific Activity Score average to >/= 5 to indicate improvement in functional activities.   Baseline: SEE OBJECTIVE DATA Goal status: Ongoing   02/02/2024  PLAN:  PT FREQUENCY: 2x/week  PT DURATION: 12 weeks  PLANNED INTERVENTIONS: 97164- PT Re-evaluation, 97750- Physical Performance Testing, 97110-Therapeutic exercises, 97530- Therapeutic activity, [id number]- Neuromuscular re-education, [phone]- Self Care, [id number]- Gait training, [phone]- Prosthetic Initial , [phone]- Orthotic/Prosthetic subsequent, Patient/Family education, Balance training, Stair training, Scar mobilization, Vestibular training, and DME instructions  PLAN FOR NEXT SESSION:  continue with leg press,  Continue prosthetic gait with rollator or RW, prosthesis and AFO.  Standing balance activities.      Susannah Daring, PT, DPT 02/03/24 3:45 PM

## 2024-02-04 ENCOUNTER — Encounter

## 2024-02-09 ENCOUNTER — Encounter: Payer: Self-pay | Admitting: Physical Therapy

## 2024-02-09 ENCOUNTER — Ambulatory Visit: Admitting: Physical Therapy

## 2024-02-09 DIAGNOSIS — L98498 Non-pressure chronic ulcer of skin of other sites with other specified severity: Secondary | ICD-10-CM

## 2024-02-09 DIAGNOSIS — M21371 Foot drop, right foot: Secondary | ICD-10-CM

## 2024-02-09 DIAGNOSIS — M6281 Muscle weakness (generalized): Secondary | ICD-10-CM

## 2024-02-09 DIAGNOSIS — R2689 Other abnormalities of gait and mobility: Secondary | ICD-10-CM

## 2024-02-09 DIAGNOSIS — R2681 Unsteadiness on feet: Secondary | ICD-10-CM

## 2024-02-09 NOTE — Therapy (Signed)
 OUTPATIENT PHYSICAL THERAPY PROSTHETIC TREATMENT   Patient Name: Scott Navarro. MRN: [id number] DOB:12-08-1965, 58 y.o., male Today's Date: 02/09/2024  END OF SESSION:  PT End of Session - 02/09/24 1148     Visit Number 12    Number of Visits 25    Date for Recertification  03/25/24    Authorization Type [employer] other    Authorization - Number of Visits 90    PT Start Time 1145    PT Stop Time 1218    PT Time Calculation (min) 33 min    Equipment Utilized During Treatment Gait belt    Activity Tolerance Patient tolerated treatment well    Behavior During Therapy WFL for tasks assessed/performed            Past Medical History:  Diagnosis Date   Type II diabetes mellitus (HCC) dx'd 10/08/2016   Past Surgical History:  Procedure Laterality Date   AMPUTATION Right 10/09/2016   Procedure: INCISION AND DRAINAGE RIGHT GREAT TOE;  Surgeon: Harden Jerona GAILS, MD;  Location: [facility] OR;  Service: Orthopedics;  Laterality: Right;   AMPUTATION Left 07/23/2023   Procedure: AMPUTATION, BELOW KNEE, LEFT;  Surgeon: Harden Jerona GAILS, MD;  Location: [facility] OR;  Service: Orthopedics;  Laterality: Left;  IRRIGATION AND DEBRIDEMENT AND AMPUTATION OF LEFT LEG   IRRIGATION AND DEBRIDEMENT KNEE Left 07/25/2023   Procedure: IRRIGATION AND DEBRIDEMENT LEFT KNEE;  Surgeon: Harden Jerona GAILS, MD;  Location: [facility] OR;  Service: Orthopedics;  Laterality: Left;  DEBRIDEMENT LEFT LEG   Patient Active Problem List   Diagnosis Date Noted   Coping style affecting medical condition 08/05/2023   S/P BKA (below knee amputation), left (HCC) 07/30/2023   ARF (acute renal failure) 07/23/2023   Hyperlipidemia 07/23/2023   Amputation below knee (HCC) 07/23/2023   Necrotizing fasciitis (HCC) 07/22/2023   Anemia 07/22/2023   Hyponatremia 07/22/2023   Diabetic retinopathy associated with type 2 diabetes mellitus (HCC) 02/18/2023   Charcot joint of left foot 02/18/2023   Encounter for lipid screening for cardiovascular disease  02/18/2023   Screening for prostate cancer 02/18/2023   Type 2 diabetes mellitus with hyperglycemia (HCC) 11/28/2022   Gait instability 11/28/2022   Retinal hemorrhage 11/21/2022   Glaucoma 11/21/2022   Achilles tendon contracture, right 11/21/2016   Hypokalemia    Class 1 obesity due to excess calories with body mass index (BMI) of 33.0 to 33.9 in adult    Wound infection 10/09/2016   Hyperglycemia 10/09/2016   Elevated blood pressure reading 10/09/2016   Diabetic polyneuropathy associated with type 2 diabetes mellitus (HCC)    Laceration w FB of right great toe w/o damage to nail, init     PCP: Bulah Alm RAMAN, PA-C  REFERRING PROVIDER: Maurilio CHRISTELLA Collet, PA-C  ONSET DATE:  12/26/2023 prosthesis delivery  REFERRING DIAG: S10.487 (ICD-10-CM) - S/P BKA (below knee amputation), left   THERAPY DIAG:  Other abnormalities of gait and mobility  Unsteadiness on feet  Muscle weakness (generalized)  Foot drop, right  Non-pressure chronic ulcer of skin of other sites with other specified severity [facility])  Rationale for Evaluation and Treatment: Rehabilitation  SUBJECTIVE:   SUBJECTIVE STATEMENT: He had eye procedure on Wednesday, 12/3 to left eye. Then on Friday night he started having significant left eye vision issues.  His balance is off.  His son had surgery for kidney stones on Friday so lot of walking in hospital using his rollator walker.    PERTINENT HISTORY: left TTA 07/23/23, blind  in right eye, DM2, polyneuropathy, Acute renal failure, HLD  PAIN:  Are you having pain? No  PRECAUTIONS: Fall  RED FLAGS: None   WEIGHT BEARING RESTRICTIONS: No  FALLS: Has patient fallen in last 6 months? No  LIVING ENVIRONMENT: Lives with: lives with spouse and mother with Alzheimer's. He is currently living with son who had recent medical issue making him bed / wheelchair bound.  His wife & he are son's caregivers but think is not permanent situation.   Lives in: House his house  is 2 story development worker, community / bedroom main level) and son's house is single level (where they are currently staying).  Home Access: 3 steps to main entrance to his home and son's house has ramp.  Stairs: Yes: Internal: 14 steps; on right going up and External: 3 steps; on left going up Has following equipment at home: Single point cane, Walker - 2 wheeled, Wheelchair (manual), Shower bench, and bed side commode  PLOF: Independent, Independent with household mobility without device, and Independent with community mobility without device  Occupation: retired  PATIENT GOALS: to use prosthesis normal activities: driving, shopping, yard work,  active at place in [employer],   OBJECTIVE:  Patient-Specific Activity Scoring Scheme  0 represents "unable to perform." 10 represents "able to perform at prior level. 0 1 2 3 4 5 6 7 8 9  10 (Date and Score)   Activity Eval 12/29/23    1. Care & wear of prosthesis  1    2. Standing ADLs   2    3. Walking  2   4. Outdoor activities 0   5.    Score 1.25    Total score = sum of the activity scores/number of activities Minimum detectable change (90%CI) for average score = 2 points Minimum detectable change (90%CI) for single activity score = 3 points  COGNITION: Evaluation on 12/29/2023: Overall cognitive status: Within functional limits for tasks assessed   SENSATION: Evaluation on 12/29/2023: [facility]  CARDIOVASCULAR RESPONSE: Evaluation on 12/29/2023: Functional activity: standing balance & gait Post-activity vitals: HR: 116 SpO2: 100% Modified Borg scale for dyspnea: 2: mild shortness of breath  POSTURE:  Evaluation on 12/29/2023: rounded shoulders, forward head, flexed trunk , and weight shift right  LOWER EXTREMITY ROM:  ROM P:passive  A:active Left Eval 12/29/23  Hip flexion   Hip extension   Hip abduction   Hip adduction   Hip internal rotation   Hip external rotation   Knee flexion   Knee extension 0*  Ankle  dorsiflexion   Ankle plantarflexion   Ankle inversion   Ankle eversion    (Blank rows = not tested)  LOWER EXTREMITY MMT:  MMT Right Eval 12/29/23 Left Eval 12/2723  Hip flexion    Hip extension 3/5   Hip abduction 3/5   Hip adduction    Hip internal rotation    Hip external rotation    Knee flexion 3/5   Knee extension 3/5 4/5  Ankle dorsiflexion  2-/5  Ankle plantarflexion    Ankle inversion    Ankle eversion    At Evaluation all strength testing is grossly seated and functionally standing / gait. (Blank rows = not tested)  TRANSFERS: Evaluation on 12/29/2023: Sit to stand: SBA from 22 w/c requires use of armrests and back of legs against chair &/or RW for stabilization. Stand to sit: SBA RW to 22 w/c requires use of armrests and back of legs against chair &/or RW for stabilization.  FUNCTIONAL TESTs:  01/28/2024:  with locked rollator walker support & PT supervision: pt able to reach 7 anteriorly, pick up item from floor and look over his shoulders.  Evaluation on 12/29/2023: Lars Balance 12/56  [facility] PT Assessment - 12/29/23 0900       Standardized Balance Assessment   Standardized Balance Assessment Berg Balance Test      Berg Balance Test   Sit to Stand Needs minimal aid to stand or to stabilize    Standing Unsupported Needs several tries to stand 30 seconds unsupported    Sitting with Back Unsupported but Feet Supported on Floor or Stool Able to sit safely and securely 2 minutes    Stand to Sit Controls descent by using hands    Transfers Able to transfer safely, definite need of hands    Standing Unsupported with Eyes Closed Needs help to keep from falling    Standing Unsupported with Feet Together Needs help to attain position and unable to hold for 15 seconds    From Standing, Reach Forward with Outstretched Arm Loses balance while trying/requires external support    From Standing Position, Pick up Object from Floor Unable to try/needs assist to keep  balance    From Standing Position, Turn to Look Behind Over each Shoulder Needs assist to keep from losing balance and falling    Turn 360 Degrees Needs assistance while turning    Standing Unsupported, Alternately Place Feet on Step/Stool Needs assistance to keep from falling or unable to try    Standing Unsupported, One Foot in [employer] balance while stepping or standing    Standing on One Leg Unable to try or needs assist to prevent fall    Total Score 12    Berg comment: < 36 high risk for falls (close to 100%) 46-51 moderate (>50%)   37-45 significant (>80%) 52-55 lower (> 25%)          GAIT: 01/28/2024: Pt amb 150' with rollator walker with supervision.  PT neg ramp & curb with rollator walker with min/CGA.  Evaluation on 12/29/2023: Distance walked: 60' Assistive device utilized: [employer] - 2 wheeled and TTA prosthesis Level of assistance: Min A (multiple balance losses requiring PT assist to stabilize especially in turns) Gait pattern: step through pattern, decreased step length- Right, decreased stance time- Left, decreased hip/knee flexion- Left, decreased ankle dorsiflexion- Right, Right hip hike, Left hip hike, genu recurvatum- Right, antalgic, trendelenburg, lateral hip instability, trunk flexed, and poor foot clearance- Right, left knee instability / flexion moment intermittently  Gait velocity:  0.97 ft/sec Comments: excess UE weight bearing on RW.    CURRENT PROSTHETIC WEAR ASSESSMENT: Evaluation on 12/29/2023:  Patient is dependent with: skin check, residual limb care, care of non-amputated limb, prosthetic cleaning, ply sock cleaning, correct ply sock adjustment, proper wear schedule/adjustment, and proper weight-bearing schedule/adjustment Donning prosthesis: SBA Doffing prosthesis: SBA Prosthetic wear tolerance: up to 1.5 hours, 2x/day, 2 of 3 days since delivery Prosthetic weight bearing tolerance: 5 minutes with no c/o residual limb pain Edema: pitting Residual  limb condition: cylindrical shape, 3 cm superficial scab at anterior-lateral limb with no signs of infection, small wound on lateral limb proximal to fibular head with white covering 0.4 cm, invaginated scar lateral aspect, dry flaking skin, no hair growth, redness over patella (friction from liner when seated), normal temperature.  Prosthetic description: silicon (9 mm anterior portion) with pin lock suspension, total contact socket with flexible inner socket.    TODAY'S TREATMENT:  DATE:  02/09/2024: Prosthetic Care with left transtiial prosthesis and right blue rocker AFO  Pt amb 200' with rollator walker with CGA due to visual changes impairing his balance.  Side stepping with PT cues on technique for hip muscle activity to facilitate loading limb without lateral trunk lean.  Progressed BUE support to unilateral finger tip support.   Progressed to turning using above technique.   Backwards gait with PT demo & verbal cues on technique including weight shift over stance LE prior to stepping contralateral LE.    TherAct: Pt reviewed proper sit to/from stand technique working on not using LEs against chair for stabilization.  5 reps with 18 chair with armrests, progressed to 18 chair without armrests 5 reps LUE on seat / RUE on locked rollator & 5 reps with RUE on seat / LUE on locked rollator with increased difficulty due to LE strength. 10 reps with 24 bar stool with UE assist with PT minA.     TREATMENT:                                                                                                                             DATE:  02/03/2024: Prosthetic Care with left transtiial prosthesis and right blue rocker AFO  PT and patient discussed ply sock wear and appropriate use as well as how to use tall kitchen chairs as the barstool when performing tasks within the home.   Patient with improved ambulation mechanics with rollator and controlled sit<>stands this date.  Neuro Re-Ed: rollator placed to Lt side, chair behind patient, and PT providing at least CGA throughout  Standing balance while reaching to light up pods on window  1x30s using Rt UE only while keeping Lt UE on rollator for stability; no LOB or increase in required assist  2x30s using Rt UE only with no UE use on rollator for stability; intermittent increase of assist from PT to mod A for posterior LOB; patient able to self-correct lateral and fwd LOB with stepping reaction or UE use on window/rollator  2x30s using Lt UE only with no UE use on rollator for stability; intermittent increase of assist from PT to mod A for posterior LOB; patient able to self-correct lateral and fwd LOB with stepping reaction or UE use on window/rollator 2x30s using bilat UE for reaching with no UE use on rollator for stability; intermittent increase of assist from PT to mod A for posterior LOB; patient able to self-correct lateral and fwd LOB with stepping reaction or UE use on window/rollator  Patient continued to improve upon balance strategies with increased time and repetitions   TherAct: Bilat leg press 2x15 with 112#  Unilat leg press Rt LE 1x10 with 50#, Lt LE 1x8 with 31# Highly fatigued as he did leg press at yesterday's session    TREATMENT:  DATE:  02/02/2024: Prosthetic Care with left Transtibial  prosthesis and right blue rocker AFO:  Pt amb 150' x 2 with rollator walker with supervision.  PT neg ramp & curb with rollator walker with SBA.  Therapeutic Activities:  Leg press BLEs 112# 15 reps 2 sets;  single LE - 10 reps 2 sets RLE 56# & LLE 50# (had to reduce wt to 37# for last 4 reps) PT educated pt with verbal cues while pt performing simulated driving.  Pt verbalized understanding.     Neuromuscular Re-education: working on standing balance  Standing UE resistance - single UE 10 reps ea UE with contralateral UE support on locked rollator, 2nd set single UE without UE support with PT minA/tactile cues, then 10 reps BUEs  without UE support with PT minA/tactile cues.  Green theraband.      TREATMENT:                                                                                                                             DATE:  01/28/2024: Prosthetic Care with left Transtibial  prosthesis and right blue rocker AFO: PT demo & verbal cues on rollator walker safety & use.  PT demo & verbal cues on sit to/from stand to rollator from standard chair, sit to/from stand to rollator seat, proper hand position on brakes, proper location of his body/feet to rollator while ambulating, turning with rollator walker and negotiating ramp & curb.  Patient performed all with PT in clinic and verbalized better understanding.   Pt amb 150' with rollator walker with supervision.  PT neg ramp & curb with rollator walker with min/CGA. Pt able to sit on rollator seat safely and stand up with one hand on handle & other lower point where back support attaches.    Therapeutic Activities:  Leg press BLEs 106# 10 reps 2 sets;  single LE - 10 reps 2 sets RLE 43# & LLE 37# See objective data  Neuromuscular Re-education: working on standing balance  Standing in corner with chair back in front:  Wide stance on floor eyes open head turns right/left, up/down & diagonals with frequent touch walls or chair.  Cues for stabilizing with equal wt BLEs at midpoint of each motion.   Wide stance finger tip support on side of chair (limit weight bearing thru UEs) on floor eyes open head turns right/left, up/down & diagonals with frequent touch walls or chair.  Cues for stabilizing with equal wt BLEs at midpoint of each motion.       HOME EXERCISE PROGRAM: Do each exercise 1-2  times per day Do each exercise  5-10 repetitions Hold each exercise for 2 seconds to feel your location  AT SINK FIND YOUR MIDLINE POSITION AND PLACE FEET EQUAL DISTANCE FROM THE MIDLINE.  Try to find this position when standing still for activities.   USE TAPE ON FLOOR TO MARK THE MIDLINE POSITION which is even with middle of sink.  You  also should try to feel with your limb pressure in socket.  You are trying to feel with limb what you used to feel with the bottom of your foot.  Side to Side Shift: Moving your hips only (not shoulders): move weight onto your left leg, HOLD/FEEL pressure in socket.  Move back to equal weight on each leg, HOLD/FEEL pressure in socket. Move weight onto your right leg, HOLD/FEEL pressure in socket. Move back to equal weight on each leg, HOLD/FEEL pressure in socket. Repeat.  Start with both hands on sink, progress to hand on prosthetic side only Front to Back Shift: Moving your hips only (not shoulders): move your weight forward onto your toes, HOLD/FEEL pressure in socket. Move your weight back to equal Flat Foot on both legs, HOLD/FEEL  pressure in socket. Move your weight back onto your heels, HOLD/FEEL  pressure in socket. Move your weight back to equal on both legs, HOLD/FEEL  pressure in socket. Repeat.  Start with both hands on sink, progress to hand on prosthetic side only Moving Cones / Cups: With equal weight on each leg: Hold on with one hand the first time, then progress to no hand supports. Move cups from one side of sink to the other. Place cups ~2" out of your reach, progress to 10" beyond reach.  Place one hand in middle of sink and reach with other hand. Do both arms.  Overhead/Upward Reaching: alternated reaching up to top cabinets or ceiling if no cabinets present. Keep equal weight on each leg. Start with one hand support on counter while other hand reaches and progress to no hand support with reaching.  ace one hand in middle of sink and reach with other hand. Do both arms.  5.    Looking Over Shoulders: With equal weight on each leg: alternate turning to look over your shoulders with one hand support on counter as needed.  Start with head motions only to look in front of shoulder, then even with shoulder and progress to looking behind you. To look to side, move head /eyes, then shoulder on side looking pulls back, shift more weight to side looking and pull hip back. Place one hand in middle of sink and let go with other hand so your shoulder can pull back. Switch hands to look other way.   6.  Stepping into lower cabinet:  Move items under cabinet out of your way. Shift your hips/pelvis so weight on prosthesis. Tighten muscles in hip on prosthetic side.  SLOWLY step other leg so front of foot is in cabinet. Then step back to floor. Repeat with other leg.    ASSESSMENT:  CLINICAL IMPRESSION: Patient had increased balance issues today due to visual changes.  PT worked on technique for side stepping and backwards gait.  Patient will continue to benefit from skilled PT.  OBJECTIVE IMPAIRMENTS: Abnormal gait, decreased activity tolerance, decreased balance, decreased endurance, decreased knowledge of condition, decreased knowledge of use of DME, decreased mobility, difficulty walking, decreased strength, increased edema, postural dysfunction, prosthetic dependency , and impaired skin integrity.   ACTIVITY LIMITATIONS: carrying, lifting, bending, sitting, standing, stairs, transfers, reach over head, and locomotion level  PARTICIPATION LIMITATIONS: meal prep, driving, shopping, community activity, and yard work  PERSONAL FACTORS: Fitness, Time since onset of injury/illness/exacerbation, and 3+ comorbidities: see PMH are also affecting patient's functional outcome.   REHAB POTENTIAL: Good  CLINICAL DECISION MAKING: Evolving/moderate complexity  EVALUATION COMPLEXITY: Moderate   GOALS: Goals reviewed with patient? Yes  SHORT TERM  GOALS: Target date: 01/28/2024  Patient  donnes prosthesis modified independent & verbalizes proper cleaning. Baseline: SEE OBJECTIVE DATA Goal status:   MET   01/28/2024 2.  Patient tolerates prosthesis >10 hrs total /day without skin issues or limb pain >5/10 after standing. Baseline: SEE OBJECTIVE DATA Goal status: MET 01/26/2024  3.  Patient able to reach 7, pick up object from floor and look over both shoulders with RW support with supervision. Baseline: SEE OBJECTIVE DATA Goal status: MET  01/28/2024  4. Patient ambulates 69' with RW & prosthesis with supervision. Baseline: SEE OBJECTIVE DATA Goal status: MET   01/28/2024  5. Patient negotiates ramps & curbs with RW & prosthesis with minA. Baseline: SEE OBJECTIVE DATA Goal status:  MET  01/19/2024  [place] TERM GOALS: Target date: 03/25/2024  Patient demonstrates & verbalized understanding of prosthetic care to enable safe utilization of prosthesis. Baseline: SEE OBJECTIVE DATA Goal status: Ongoing  02/09/2024  Patient tolerates prosthesis wear >90% of awake hours without skin or limb pain issues. Baseline: SEE OBJECTIVE DATA Goal status: Ongoing   02/09/2024  Berg Balance >30/56 to indicate lower fall risk Baseline: SEE OBJECTIVE DATA Goal status: Ongoing   02/09/2024  Patient ambulates >300' with prosthesis & LRAD independently Baseline: SEE OBJECTIVE DATA Goal status: Ongoing  02/09/2024  Patient negotiates ramps, curbs & stairs with single rail with prosthesis & LRAD independently. Baseline: SEE OBJECTIVE DATA Goal status: Ongoing   02/09/2024  Patient reports Patient-Specific Activity Score average to >/= 5 to indicate improvement in functional activities.   Baseline: SEE OBJECTIVE DATA Goal status: Ongoing   02/09/2024  PLAN:  PT FREQUENCY: 2x/week  PT DURATION: 12 weeks  PLANNED INTERVENTIONS: [id number]- PT Re-evaluation, 97750- Physical Performance Testing, 97110-Therapeutic exercises, 97530- Therapeutic activity, [id number]- Neuromuscular re-education,  [phone]- Self Care, [id number]- Gait training, [phone]- Prosthetic Initial , [phone]- Orthotic/Prosthetic subsequent, Patient/Family education, Balance training, Stair training, Scar mobilization, Vestibular training, and DME instructions  PLAN FOR NEXT SESSION:  check on eye doctor appt.   continue with leg press,  Continue prosthetic gait with rollator or RW, prosthesis and AFO.  Standing balance activities.      Grayce Spatz, PT, DPT 02/09/2024, 5:19 PM

## 2024-02-11 ENCOUNTER — Encounter: Payer: Self-pay | Admitting: Physical Therapy

## 2024-02-11 ENCOUNTER — Ambulatory Visit: Admitting: Physical Therapy

## 2024-02-11 DIAGNOSIS — R2689 Other abnormalities of gait and mobility: Secondary | ICD-10-CM

## 2024-02-11 DIAGNOSIS — M6281 Muscle weakness (generalized): Secondary | ICD-10-CM

## 2024-02-11 DIAGNOSIS — R2681 Unsteadiness on feet: Secondary | ICD-10-CM | POA: Diagnosis not present

## 2024-02-11 DIAGNOSIS — L98498 Non-pressure chronic ulcer of skin of other sites with other specified severity: Secondary | ICD-10-CM | POA: Diagnosis not present

## 2024-02-11 DIAGNOSIS — M21371 Foot drop, right foot: Secondary | ICD-10-CM

## 2024-02-11 NOTE — Therapy (Signed)
 OUTPATIENT PHYSICAL THERAPY PROSTHETIC TREATMENT   Patient Name: Scott Navarro. MRN: [id number] DOB:25-Sep-1965, 58 y.o., male Today's Date: 02/11/2024  END OF SESSION:  PT End of Session - 02/11/24 1147     Visit Number 13    Number of Visits 25    Date for Recertification  03/25/24    Authorization Type [employer] other    Authorization - Visit Number 13    Authorization - Number of Visits 90    PT Start Time 1147    PT Stop Time 1225    PT Time Calculation (min) 38 min    Equipment Utilized During Treatment Gait belt    Activity Tolerance Patient tolerated treatment well    Behavior During Therapy WFL for tasks assessed/performed         Past Medical History:  Diagnosis Date   Type II diabetes mellitus (HCC) dx'd 10/08/2016   Past Surgical History:  Procedure Laterality Date   AMPUTATION Right 10/09/2016   Procedure: INCISION AND DRAINAGE RIGHT GREAT TOE;  Surgeon: Harden Jerona GAILS, MD;  Location: [facility] OR;  Service: Orthopedics;  Laterality: Right;   AMPUTATION Left 07/23/2023   Procedure: AMPUTATION, BELOW KNEE, LEFT;  Surgeon: Harden Jerona GAILS, MD;  Location: [facility] OR;  Service: Orthopedics;  Laterality: Left;  IRRIGATION AND DEBRIDEMENT AND AMPUTATION OF LEFT LEG   IRRIGATION AND DEBRIDEMENT KNEE Left 07/25/2023   Procedure: IRRIGATION AND DEBRIDEMENT LEFT KNEE;  Surgeon: Harden Jerona GAILS, MD;  Location: [facility] OR;  Service: Orthopedics;  Laterality: Left;  DEBRIDEMENT LEFT LEG   Patient Active Problem List   Diagnosis Date Noted   Coping style affecting medical condition 08/05/2023   S/P BKA (below knee amputation), left (HCC) 07/30/2023   ARF (acute renal failure) 07/23/2023   Hyperlipidemia 07/23/2023   Amputation below knee (HCC) 07/23/2023   Necrotizing fasciitis (HCC) 07/22/2023   Anemia 07/22/2023   Hyponatremia 07/22/2023   Diabetic retinopathy associated with type 2 diabetes mellitus (HCC) 02/18/2023   Charcot joint of left foot 02/18/2023   Encounter for lipid screening  for cardiovascular disease 02/18/2023   Screening for prostate cancer 02/18/2023   Type 2 diabetes mellitus with hyperglycemia (HCC) 11/28/2022   Gait instability 11/28/2022   Retinal hemorrhage 11/21/2022   Glaucoma 11/21/2022   Achilles tendon contracture, right 11/21/2016   Hypokalemia    Class 1 obesity due to excess calories with body mass index (BMI) of 33.0 to 33.9 in adult    Wound infection 10/09/2016   Hyperglycemia 10/09/2016   Elevated blood pressure reading 10/09/2016   Diabetic polyneuropathy associated with type 2 diabetes mellitus (HCC)    Laceration w FB of right great toe w/o damage to nail, init     PCP: Bulah Alm RAMAN, PA-C  REFERRING PROVIDER: Maurilio CHRISTELLA Collet, PA-C  ONSET DATE:  12/26/2023 prosthesis delivery  REFERRING DIAG: S10.487 (ICD-10-CM) - S/P BKA (below knee amputation), left   THERAPY DIAG:  Other abnormalities of gait and mobility  Unsteadiness on feet  Muscle weakness (generalized)  Foot drop, right  Non-pressure chronic ulcer of skin of other sites with other specified severity [facility])  Rationale for Evaluation and Treatment: Rehabilitation  SUBJECTIVE:   SUBJECTIVE STATEMENT: Eye doctor said ruptured blood vessel in left eye and clearing with time.  He is going to restart eye injections every 4 months.    PERTINENT HISTORY: left TTA 07/23/23, blind in right eye, DM2, polyneuropathy, Acute renal failure, HLD  PAIN:  Are you having pain? No  PRECAUTIONS: Fall  RED FLAGS: None   WEIGHT BEARING RESTRICTIONS: No  FALLS: Has patient fallen in last 6 months? No  LIVING ENVIRONMENT: Lives with: lives with spouse and mother with Alzheimer's. He is currently living with son who had recent medical issue making him bed / wheelchair bound.  His wife & he are son's caregivers but think is not permanent situation.   Lives in: House his house is 2 story development worker, community / bedroom main level) and son's house is single level (where they are  currently staying).  Home Access: 3 steps to main entrance to his home and son's house has ramp.  Stairs: Yes: Internal: 14 steps; on right going up and External: 3 steps; on left going up Has following equipment at home: Single point cane, Walker - 2 wheeled, Wheelchair (manual), Shower bench, and bed side commode  PLOF: Independent, Independent with household mobility without device, and Independent with community mobility without device  Occupation: retired  PATIENT GOALS: to use prosthesis normal activities: driving, shopping, yard work,  active at place in [place] [employer],   OBJECTIVE:  Patient-Specific Activity Scoring Scheme  0 represents unable to perform. 10 represents able to perform at prior level. 0 1 2 3 4 5 6 7 8 9  10 (Date and Score)   Activity Eval 12/29/23    1. Care & wear of prosthesis  1    2. Standing ADLs   2    3. Walking  2   4. Outdoor activities 0   5.    Score 1.25    Total score = sum of the activity scores/number of activities Minimum detectable change (90%CI) for average score = 2 points Minimum detectable change (90%CI) for single activity score = 3 points  COGNITION: Evaluation on 12/29/2023: Overall cognitive status: Within functional limits for tasks assessed   SENSATION: Evaluation on 12/29/2023: [facility]  CARDIOVASCULAR RESPONSE: Evaluation on 12/29/2023: Functional activity: standing balance & gait Post-activity vitals: HR: 116 SpO2: 100% Modified Borg scale for dyspnea: 2: mild shortness of breath  POSTURE:  Evaluation on 12/29/2023: rounded shoulders, forward head, flexed trunk , and weight shift right  LOWER EXTREMITY ROM:  ROM P:passive  A:active Left Eval 12/29/23  Hip flexion   Hip extension   Hip abduction   Hip adduction   Hip internal rotation   Hip external rotation   Knee flexion   Knee extension 0*  Ankle dorsiflexion   Ankle plantarflexion   Ankle inversion   Ankle eversion    (Blank rows = not  tested)  LOWER EXTREMITY MMT:  MMT Right Eval 12/29/23 Left Eval 12/2723  Hip flexion    Hip extension 3/5   Hip abduction 3/5   Hip adduction    Hip internal rotation    Hip external rotation    Knee flexion 3/5   Knee extension 3/5 4/5  Ankle dorsiflexion  2-/5  Ankle plantarflexion    Ankle inversion    Ankle eversion    At Evaluation all strength testing is grossly seated and functionally standing / gait. (Blank rows = not tested)  TRANSFERS: Evaluation on 12/29/2023: Sit to stand: SBA from 22 w/c requires use of armrests and back of legs against chair &/or RW for stabilization. Stand to sit: SBA RW to 22 w/c requires use of armrests and back of legs against chair &/or RW for stabilization.  FUNCTIONAL TESTs:  01/28/2024:  with locked rollator walker support & PT supervision: pt able to reach 7 anteriorly, pick  up item from floor and look over his shoulders.  Evaluation on 12/29/2023: Lars Balance 12/56  [facility] PT Assessment - 12/29/23 0900       Standardized Balance Assessment   Standardized Balance Assessment Berg Balance Test      Berg Balance Test   Sit to Stand Needs minimal aid to stand or to stabilize    Standing Unsupported Needs several tries to stand 30 seconds unsupported    Sitting with Back Unsupported but Feet Supported on Floor or Stool Able to sit safely and securely 2 minutes    Stand to Sit Controls descent by using hands    Transfers Able to transfer safely, definite need of hands    Standing Unsupported with Eyes Closed Needs help to keep from falling    Standing Unsupported with Feet Together Needs help to attain position and unable to hold for 15 seconds    From Standing, Reach Forward with Outstretched Arm Loses balance while trying/requires external support    From Standing Position, Pick up Object from Floor Unable to try/needs assist to keep balance    From Standing Position, Turn to Look Behind Over each Shoulder Needs assist to keep  from losing balance and falling    Turn 360 Degrees Needs assistance while turning    Standing Unsupported, Alternately Place Feet on Step/Stool Needs assistance to keep from falling or unable to try    Standing Unsupported, One Foot in [employer] balance while stepping or standing    Standing on One Leg Unable to try or needs assist to prevent fall    Total Score 12    Berg comment: < 36 high risk for falls (close to 100%) 46-51 moderate (>50%)   37-45 significant (>80%) 52-55 lower (> 25%)          GAIT: 01/28/2024: Pt amb 150' with rollator walker with supervision.  PT neg ramp & curb with rollator walker with min/CGA.  Evaluation on 12/29/2023: Distance walked: 60' Assistive device utilized: [employer] - 2 wheeled and TTA prosthesis Level of assistance: Min A (multiple balance losses requiring PT assist to stabilize especially in turns) Gait pattern: step through pattern, decreased step length- Right, decreased stance time- Left, decreased hip/knee flexion- Left, decreased ankle dorsiflexion- Right, Right hip hike, Left hip hike, genu recurvatum- Right, antalgic, trendelenburg, lateral hip instability, trunk flexed, and poor foot clearance- Right, left knee instability / flexion moment intermittently  Gait velocity:  0.97 ft/sec Comments: excess UE weight bearing on RW.    CURRENT PROSTHETIC WEAR ASSESSMENT: Evaluation on 12/29/2023:  Patient is dependent with: skin check, residual limb care, care of non-amputated limb, prosthetic cleaning, ply sock cleaning, correct ply sock adjustment, proper wear schedule/adjustment, and proper weight-bearing schedule/adjustment Donning prosthesis: SBA Doffing prosthesis: SBA Prosthetic wear tolerance: up to 1.5 hours, 2x/day, 2 of 3 days since delivery Prosthetic weight bearing tolerance: 5 minutes with no c/o residual limb pain Edema: pitting Residual limb condition: cylindrical shape, 3 cm superficial scab at anterior-lateral limb with no signs  of infection, small wound on lateral limb proximal to fibular head with white covering 0.4 cm, invaginated scar lateral aspect, dry flaking skin, no hair growth, redness over patella (friction from liner when seated), normal temperature.  Prosthetic description: silicon (9 mm anterior portion) with pin lock suspension, total contact socket with flexible inner socket.    TODAY'S TREATMENT:  DATE:  02/11/2024: Prosthetic Care with left transtiial prosthesis and right blue rocker AFO  Pt amb 200' with rollator walker with CGA due to visual changes impairing his balance.  Side stepping with PT cues on technique for hip muscle activity to facilitate loading limb without lateral trunk lean.  Progressed BUE support to unilateral finger tip support.   Progressed to turning using above technique.   Backwards gait with rollator walker with PT demo & verbal cues on technique including weight shift over stance LE prior to stepping contralateral LE.  Pt performed 20' X 3 with min/CGA.   Ambulating with rollator walker with eyes closed 20' X 3 working on ambulating in dark with min/CGA.   TherAct: Sit to / from stand 10 reps 2 sets with 24 bar stool with UE assist with PT minA.   Neuromuscular Re-education: Stepping on & off Airex alternating lead LE 5 reps with BUE support and with single UE 5 reps RUE & 5 reps LUE with min/CGA.   SLS on Airex stepping onto Airex and contralateral LE stepping to 8 step anteriorly then back to floor with BUE support and PT CGA for safety. 5 reps 2 sets with ea LE.   Side stepping with PT cues for hip muscle engagement and weight shift. Progressed from BUE support to single UE support.  CGA for safety.   Alternating standing hip motions forward kick, abduction and ext - BUE support 5 reps, LUE support 5 reps & RUE support 5 reps.  CGA for safety.      TREATMENT:                                                                                                                             DATE:  02/09/2024: Prosthetic Care with left transtiial prosthesis and right blue rocker AFO  Pt amb 200' with rollator walker with CGA due to visual changes impairing his balance.  Side stepping with PT cues on technique for hip muscle activity to facilitate loading limb without lateral trunk lean.  Progressed BUE support to unilateral finger tip support.   Progressed to turning using above technique.   Backwards gait with PT demo & verbal cues on technique including weight shift over stance LE prior to stepping contralateral LE.    TherAct: Pt reviewed proper sit to/from stand technique working on not using LEs against chair for stabilization.  5 reps with 18 chair with armrests, progressed to 18 chair without armrests 5 reps LUE on seat / RUE on locked rollator & 5 reps with RUE on seat / LUE on locked rollator with increased difficulty due to LE strength. 10 reps with 24 bar stool with UE assist with PT minA.     TREATMENT:  DATE:  02/03/2024: Prosthetic Care with left transtiial prosthesis and right blue rocker AFO  PT and patient discussed ply sock wear and appropriate use as well as how to use tall kitchen chairs as the barstool when performing tasks within the home.  Patient with improved ambulation mechanics with rollator and controlled sit<>stands this date.  Neuro Re-Ed: rollator placed to Lt side, chair behind patient, and PT providing at least CGA throughout  Standing balance while reaching to light up pods on window  1x30s using Rt UE only while keeping Lt UE on rollator for stability; no LOB or increase in required assist  2x30s using Rt UE only with no UE use on rollator for stability; intermittent increase of assist  from PT to mod A for posterior LOB; patient able to self-correct lateral and fwd LOB with stepping reaction or UE use on window/rollator  2x30s using Lt UE only with no UE use on rollator for stability; intermittent increase of assist from PT to mod A for posterior LOB; patient able to self-correct lateral and fwd LOB with stepping reaction or UE use on window/rollator 2x30s using bilat UE for reaching with no UE use on rollator for stability; intermittent increase of assist from PT to mod A for posterior LOB; patient able to self-correct lateral and fwd LOB with stepping reaction or UE use on window/rollator  Patient continued to improve upon balance strategies with increased time and repetitions   TherAct: Bilat leg press 2x15 with 112#  Unilat leg press Rt LE 1x10 with 50#, Lt LE 1x8 with 31# Highly fatigued as he did leg press at yesterday's session    TREATMENT:                                                                                                                             DATE:  02/02/2024: Prosthetic Care with left Transtibial  prosthesis and right blue rocker AFO:  Pt amb 150' x 2 with rollator walker with supervision.  PT neg ramp & curb with rollator walker with SBA.  Therapeutic Activities:  Leg press BLEs 112# 15 reps 2 sets;  single LE - 10 reps 2 sets RLE 56# & LLE 50# (had to reduce wt to 37# for last 4 reps) PT educated pt with verbal cues while pt performing simulated driving.  Pt verbalized understanding.    Neuromuscular Re-education: working on standing balance  Standing UE resistance - single UE 10 reps ea UE with contralateral UE support on locked rollator, 2nd set single UE without UE support with PT minA/tactile cues, then 10 reps BUEs  without UE support with PT minA/tactile cues.  Green theraband.      HOME EXERCISE PROGRAM: Do each exercise 1-2  times per day Do each exercise 5-10 repetitions Hold each exercise for 2 seconds to feel your  location  AT SINK FIND YOUR MIDLINE POSITION AND PLACE FEET EQUAL DISTANCE FROM THE MIDLINE.  Try to find this position  when standing still for activities.   USE TAPE ON FLOOR TO MARK THE MIDLINE POSITION which is even with middle of sink.  You also should try to feel with your limb pressure in socket.  You are trying to feel with limb what you used to feel with the bottom of your foot.  Side to Side Shift: Moving your hips only (not shoulders): move weight onto your left leg, HOLD/FEEL pressure in socket.  Move back to equal weight on each leg, HOLD/FEEL pressure in socket. Move weight onto your right leg, HOLD/FEEL pressure in socket. Move back to equal weight on each leg, HOLD/FEEL pressure in socket. Repeat.  Start with both hands on sink, progress to hand on prosthetic side only Front to Back Shift: Moving your hips only (not shoulders): move your weight forward onto your toes, HOLD/FEEL pressure in socket. Move your weight back to equal Flat Foot on both legs, HOLD/FEEL  pressure in socket. Move your weight back onto your heels, HOLD/FEEL  pressure in socket. Move your weight back to equal on both legs, HOLD/FEEL  pressure in socket. Repeat.  Start with both hands on sink, progress to hand on prosthetic side only Moving Cones / Cups: With equal weight on each leg: Hold on with one hand the first time, then progress to no hand supports. Move cups from one side of sink to the other. Place cups ~2 out of your reach, progress to 10 beyond reach.  Place one hand in middle of sink and reach with other hand. Do both arms.  Overhead/Upward Reaching: alternated reaching up to top cabinets or ceiling if no cabinets present. Keep equal weight on each leg. Start with one hand support on counter while other hand reaches and progress to no hand support with reaching.  ace one hand in middle of sink and reach with other hand. Do both arms.  5.   Looking Over Shoulders: With equal weight on each leg: alternate  turning to look over your shoulders with one hand support on counter as needed.  Start with head motions only to look in front of shoulder, then even with shoulder and progress to looking behind you. To look to side, move head /eyes, then shoulder on side looking pulls back, shift more weight to side looking and pull hip back. Place one hand in middle of sink and let go with other hand so your shoulder can pull back. Switch hands to look other way.   6.  Stepping into lower cabinet:  Move items under cabinet out of your way. Shift your hips/pelvis so weight on prosthesis. Tighten muscles in hip on prosthetic side.  SLOWLY step other leg so front of foot is in cabinet. Then step back to floor. Repeat with other leg.    ASSESSMENT:  CLINICAL IMPRESSION: PT worked on balance reactions working on compliant surfaces. He requires UE assist but improved hip & knee stability.    Patient will continue to benefit from skilled PT.  OBJECTIVE IMPAIRMENTS: Abnormal gait, decreased activity tolerance, decreased balance, decreased endurance, decreased knowledge of condition, decreased knowledge of use of DME, decreased mobility, difficulty walking, decreased strength, increased edema, postural dysfunction, prosthetic dependency , and impaired skin integrity.   ACTIVITY LIMITATIONS: carrying, lifting, bending, sitting, standing, stairs, transfers, reach over head, and locomotion level  PARTICIPATION LIMITATIONS: meal prep, driving, shopping, community activity, and yard work  PERSONAL FACTORS: Fitness, Time since onset of injury/illness/exacerbation, and 3+ comorbidities: see PMH are also affecting patient's functional outcome.  REHAB POTENTIAL: Good  CLINICAL DECISION MAKING: Evolving/moderate complexity  EVALUATION COMPLEXITY: Moderate   GOALS: Goals reviewed with patient? Yes  SHORT TERM GOALS: Target date: 01/28/2024  Patient donnes prosthesis modified independent & verbalizes proper  cleaning. Baseline: SEE OBJECTIVE DATA Goal status:   MET   01/28/2024 2.  Patient tolerates prosthesis >10 hrs total /day without skin issues or limb pain >5/10 after standing. Baseline: SEE OBJECTIVE DATA Goal status: MET 01/26/2024  3.  Patient able to reach 7, pick up object from floor and look over both shoulders with RW support with supervision. Baseline: SEE OBJECTIVE DATA Goal status: MET  01/28/2024  4. Patient ambulates 48' with RW & prosthesis with supervision. Baseline: SEE OBJECTIVE DATA Goal status: MET   01/28/2024  5. Patient negotiates ramps & curbs with RW & prosthesis with minA. Baseline: SEE OBJECTIVE DATA Goal status:  MET  01/19/2024  [place] TERM GOALS: Target date: 03/25/2024  Patient demonstrates & verbalized understanding of prosthetic care to enable safe utilization of prosthesis. Baseline: SEE OBJECTIVE DATA Goal status: Ongoing  02/09/2024  Patient tolerates prosthesis wear >90% of awake hours without skin or limb pain issues. Baseline: SEE OBJECTIVE DATA Goal status: Ongoing   02/09/2024  Berg Balance >30/56 to indicate lower fall risk Baseline: SEE OBJECTIVE DATA Goal status: Ongoing   02/09/2024  Patient ambulates >300' with prosthesis & LRAD independently Baseline: SEE OBJECTIVE DATA Goal status: Ongoing  02/09/2024  Patient negotiates ramps, curbs & stairs with single rail with prosthesis & LRAD independently. Baseline: SEE OBJECTIVE DATA Goal status: Ongoing   02/09/2024  Patient reports Patient-Specific Activity Score average to >/= 5 to indicate improvement in functional activities.   Baseline: SEE OBJECTIVE DATA Goal status: Ongoing   02/09/2024  PLAN:  PT FREQUENCY: 2x/week  PT DURATION: 12 weeks  PLANNED INTERVENTIONS: 97164- PT Re-evaluation, 97750- Physical Performance Testing, 97110-Therapeutic exercises, 97530- Therapeutic activity, [id number]- Neuromuscular re-education, [phone]- Self Care, [id number]- Gait training, [phone]- Prosthetic  Initial , [phone]- Orthotic/Prosthetic subsequent, Patient/Family education, Balance training, Stair training, Scar mobilization, Vestibular training, and DME instructions  PLAN FOR NEXT SESSION:    continue with leg press,  Continue prosthetic gait with rollator or RW, prosthesis and AFO.  Standing balance activities.      Grayce Spatz, PT, DPT 02/11/2024, 5:49 PM

## 2024-02-16 ENCOUNTER — Ambulatory Visit: Admitting: Physical Therapy

## 2024-02-16 ENCOUNTER — Encounter: Payer: Self-pay | Admitting: Physical Therapy

## 2024-02-16 DIAGNOSIS — M6281 Muscle weakness (generalized): Secondary | ICD-10-CM

## 2024-02-16 DIAGNOSIS — M21371 Foot drop, right foot: Secondary | ICD-10-CM

## 2024-02-16 DIAGNOSIS — L98498 Non-pressure chronic ulcer of skin of other sites with other specified severity: Secondary | ICD-10-CM

## 2024-02-16 DIAGNOSIS — R2681 Unsteadiness on feet: Secondary | ICD-10-CM

## 2024-02-16 DIAGNOSIS — R2689 Other abnormalities of gait and mobility: Secondary | ICD-10-CM

## 2024-02-16 NOTE — Therapy (Signed)
 OUTPATIENT PHYSICAL THERAPY PROSTHETIC TREATMENT   Patient Name: Scott Navarro. MRN: [id number] DOB:Jun 03, 1965, 58 y.o., male Today's Date: 02/16/2024  END OF SESSION:  PT End of Session - 02/16/24 1146     Visit Number 14    Number of Visits 25    Date for Recertification  03/25/24    Authorization Type [employer] other    Authorization - Visit Number 14    Authorization - Number of Visits 90    PT Start Time 1145    PT Stop Time 1227    PT Time Calculation (min) 42 min    Equipment Utilized During Treatment Gait belt    Activity Tolerance Patient tolerated treatment well    Behavior During Therapy WFL for tasks assessed/performed          Past Medical History:  Diagnosis Date   Type II diabetes mellitus (HCC) dx'd 10/08/2016   Past Surgical History:  Procedure Laterality Date   AMPUTATION Right 10/09/2016   Procedure: INCISION AND DRAINAGE RIGHT GREAT TOE;  Surgeon: Harden Jerona GAILS, MD;  Location: [facility] OR;  Service: Orthopedics;  Laterality: Right;   AMPUTATION Left 07/23/2023   Procedure: AMPUTATION, BELOW KNEE, LEFT;  Surgeon: Harden Jerona GAILS, MD;  Location: [facility] OR;  Service: Orthopedics;  Laterality: Left;  IRRIGATION AND DEBRIDEMENT AND AMPUTATION OF LEFT LEG   IRRIGATION AND DEBRIDEMENT KNEE Left 07/25/2023   Procedure: IRRIGATION AND DEBRIDEMENT LEFT KNEE;  Surgeon: Harden Jerona GAILS, MD;  Location: [facility] OR;  Service: Orthopedics;  Laterality: Left;  DEBRIDEMENT LEFT LEG   Patient Active Problem List   Diagnosis Date Noted   Coping style affecting medical condition 08/05/2023   S/P BKA (below knee amputation), left (HCC) 07/30/2023   ARF (acute renal failure) 07/23/2023   Hyperlipidemia 07/23/2023   Amputation below knee (HCC) 07/23/2023   Necrotizing fasciitis (HCC) 07/22/2023   Anemia 07/22/2023   Hyponatremia 07/22/2023   Diabetic retinopathy associated with type 2 diabetes mellitus (HCC) 02/18/2023   Charcot joint of left foot 02/18/2023   Encounter for lipid  screening for cardiovascular disease 02/18/2023   Screening for prostate cancer 02/18/2023   Type 2 diabetes mellitus with hyperglycemia (HCC) 11/28/2022   Gait instability 11/28/2022   Retinal hemorrhage 11/21/2022   Glaucoma 11/21/2022   Achilles tendon contracture, right 11/21/2016   Hypokalemia    Class 1 obesity due to excess calories with body mass index (BMI) of 33.0 to 33.9 in adult    Wound infection 10/09/2016   Hyperglycemia 10/09/2016   Elevated blood pressure reading 10/09/2016   Diabetic polyneuropathy associated with type 2 diabetes mellitus (HCC)    Laceration w FB of right great toe w/o damage to nail, init     PCP: Bulah Alm RAMAN, PA-C  REFERRING PROVIDER: Maurilio CHRISTELLA Collet, PA-C  ONSET DATE:  12/26/2023 prosthesis delivery  REFERRING DIAG: S10.487 (ICD-10-CM) - S/P BKA (below knee amputation), left   THERAPY DIAG:  Other abnormalities of gait and mobility  Unsteadiness on feet  Muscle weakness (generalized)  Foot drop, right  Non-pressure chronic ulcer of skin of other sites with other specified severity [facility]  Rationale for Evaluation and Treatment: Rehabilitation  SUBJECTIVE:   SUBJECTIVE STATEMENT: They did an injection in left eye on Wednesday and is slowly getting better. He returns in 5 weeks and then may start to consider right eye cataract surgery.  His son was re-hospitalized. He has been staying with his mother with Alzheimer's. He denies falls.    PERTINENT  HISTORY: left TTA 07/23/23, blind in right eye, DM2, polyneuropathy, Acute renal failure, HLD  PAIN:  Are you having pain? No  PRECAUTIONS: Fall  RED FLAGS: None   WEIGHT BEARING RESTRICTIONS: No  FALLS: Has patient fallen in last 6 months? No  LIVING ENVIRONMENT: Lives with: lives with spouse and mother with Alzheimer's. He is currently living with son who had recent medical issue making him bed / wheelchair bound.  His wife & he are son's caregivers but think is not permanent  situation.   Lives in: House his house is 2 story development worker, community / bedroom main level) and son's house is single level (where they are currently staying).  Home Access: 3 steps to main entrance to his home and son's house has ramp.  Stairs: Yes: Internal: 14 steps; on right going up and External: 3 steps; on left going up Has following equipment at home: Single point cane, Walker - 2 wheeled, Wheelchair (manual), Shower bench, and bed side commode  PLOF: Independent, Independent with household mobility without device, and Independent with community mobility without device  Occupation: retired  PATIENT GOALS: to use prosthesis normal activities: driving, shopping, yard work,  active at place in [place] [employer],   OBJECTIVE:  Patient-Specific Activity Scoring Scheme  0 represents unable to perform. 10 represents able to perform at prior level. 0 1 2 3 4 5 6 7 8 9  10 (Date and Score)   Activity Eval 12/29/23    1. Care & wear of prosthesis  1    2. Standing ADLs   2    3. Walking  2   4. Outdoor activities 0   5.    Score 1.25    Total score = sum of the activity scores/number of activities Minimum detectable change (90%CI) for average score = 2 points Minimum detectable change (90%CI) for single activity score = 3 points  COGNITION: Evaluation on 12/29/2023: Overall cognitive status: Within functional limits for tasks assessed   SENSATION: Evaluation on 12/29/2023: [facility]  CARDIOVASCULAR RESPONSE: Evaluation on 12/29/2023: Functional activity: standing balance & gait Post-activity vitals: HR: 116 SpO2: 100% Modified Borg scale for dyspnea: 2: mild shortness of breath  POSTURE:  Evaluation on 12/29/2023: rounded shoulders, forward head, flexed trunk , and weight shift right  LOWER EXTREMITY ROM:  ROM P:passive  A:active Left Eval 12/29/23  Hip flexion   Hip extension   Hip abduction   Hip adduction   Hip internal rotation   Hip external rotation   Knee  flexion   Knee extension 0*  Ankle dorsiflexion   Ankle plantarflexion   Ankle inversion   Ankle eversion    (Blank rows = not tested)  LOWER EXTREMITY MMT:  MMT Right Eval 12/29/23 Left Eval 12/2723  Hip flexion    Hip extension 3/5   Hip abduction 3/5   Hip adduction    Hip internal rotation    Hip external rotation    Knee flexion 3/5   Knee extension 3/5 4/5  Ankle dorsiflexion  2-/5  Ankle plantarflexion    Ankle inversion    Ankle eversion    At Evaluation all strength testing is grossly seated and functionally standing / gait. (Blank rows = not tested)  TRANSFERS: Evaluation on 12/29/2023: Sit to stand: SBA from 22 w/c requires use of armrests and back of legs against chair &/or RW for stabilization. Stand to sit: SBA RW to 22 w/c requires use of armrests and back of legs against chair &/or RW  for stabilization.  FUNCTIONAL TESTs:  01/28/2024:  with locked rollator walker support & PT supervision: pt able to reach 7 anteriorly, pick up item from floor and look over his shoulders.  Evaluation on 12/29/2023: Lars Balance 12/56  [facility] PT Assessment - 12/29/23 0900       Standardized Balance Assessment   Standardized Balance Assessment Berg Balance Test      Berg Balance Test   Sit to Stand Needs minimal aid to stand or to stabilize    Standing Unsupported Needs several tries to stand 30 seconds unsupported    Sitting with Back Unsupported but Feet Supported on Floor or Stool Able to sit safely and securely 2 minutes    Stand to Sit Controls descent by using hands    Transfers Able to transfer safely, definite need of hands    Standing Unsupported with Eyes Closed Needs help to keep from falling    Standing Unsupported with Feet Together Needs help to attain position and unable to hold for 15 seconds    From Standing, Reach Forward with Outstretched Arm Loses balance while trying/requires external support    From Standing Position, Pick up Object from Floor  Unable to try/needs assist to keep balance    From Standing Position, Turn to Look Behind Over each Shoulder Needs assist to keep from losing balance and falling    Turn 360 Degrees Needs assistance while turning    Standing Unsupported, Alternately Place Feet on Step/Stool Needs assistance to keep from falling or unable to try    Standing Unsupported, One Foot in [employer] balance while stepping or standing    Standing on One Leg Unable to try or needs assist to prevent fall    Total Score 12    Berg comment: < 36 high risk for falls (close to 100%) 46-51 moderate (>50%)   37-45 significant (>80%) 52-55 lower (> 25%)          GAIT: 01/28/2024: Pt amb 150' with rollator walker with supervision.  PT neg ramp & curb with rollator walker with min/CGA.  Evaluation on 12/29/2023: Distance walked: 60' Assistive device utilized: [employer] - 2 wheeled and TTA prosthesis Level of assistance: Min A (multiple balance losses requiring PT assist to stabilize especially in turns) Gait pattern: step through pattern, decreased step length- Right, decreased stance time- Left, decreased hip/knee flexion- Left, decreased ankle dorsiflexion- Right, Right hip hike, Left hip hike, genu recurvatum- Right, antalgic, trendelenburg, lateral hip instability, trunk flexed, and poor foot clearance- Right, left knee instability / flexion moment intermittently  Gait velocity:  0.97 ft/sec Comments: excess UE weight bearing on RW.    CURRENT PROSTHETIC WEAR ASSESSMENT: Evaluation on 12/29/2023:  Patient is dependent with: skin check, residual limb care, care of non-amputated limb, prosthetic cleaning, ply sock cleaning, correct ply sock adjustment, proper wear schedule/adjustment, and proper weight-bearing schedule/adjustment Donning prosthesis: SBA Doffing prosthesis: SBA Prosthetic wear tolerance: up to 1.5 hours, 2x/day, 2 of 3 days since delivery Prosthetic weight bearing tolerance: 5 minutes with no c/o residual  limb pain Edema: pitting Residual limb condition: cylindrical shape, 3 cm superficial scab at anterior-lateral limb with no signs of infection, small wound on lateral limb proximal to fibular head with white covering 0.4 cm, invaginated scar lateral aspect, dry flaking skin, no hair growth, redness over patella (friction from liner when seated), normal temperature.  Prosthetic description: silicon (9 mm anterior portion) with pin lock suspension, total contact socket with flexible inner socket.    TODAY'S  TREATMENT:                                                                                                                             DATE:  02/16/2024: Prosthetic Care with left transtiial prosthesis and right blue rocker AFO  Patient ambulated 80' x 4 with cane stand-alone tip and handhold assist /min to mod A.  PT cueing on upright posture, sequencing and balance reactions. PT educated patient on difference in cane designs as he plans to purchase one.  Patient verbalized understanding.  TherAct: Leg press BLEs 125# 15 reps 2 sets; RLE 62# 10 reps 2 sets except last 4 reps had reduced weight to 50#; LLE 50# 10 reps 2 sets  Neuromuscular Re-education: 4 square stepping CW and CCW with cane with mod to maxA for balance issues. Standing UE resistance blue Thera-Band- single UE 10 reps ea UE with contralateral UE support on locked rollator, 2nd set single UE without UE support with PT minA/tactile cues, then 10 reps BUEs  without UE support with PT minA/tactile cues.    TREATMENT:                                                                                                                             DATE:  02/11/2024: Prosthetic Care with left transtiial prosthesis and right blue rocker AFO  Pt amb 200' with rollator walker with CGA due to visual changes impairing his balance.  Side stepping with PT cues on technique for hip muscle activity to facilitate loading limb without lateral trunk  lean.  Progressed BUE support to unilateral finger tip support.   Progressed to turning using above technique.   Backwards gait with rollator walker with PT demo & verbal cues on technique including weight shift over stance LE prior to stepping contralateral LE.  Pt performed 20' X 3 with min/CGA.   Ambulating with rollator walker with eyes closed 20' X 3 working on ambulating in dark with min/CGA.   TherAct: Sit to / from stand 10 reps 2 sets with 24 bar stool with UE assist with PT minA.   Neuromuscular Re-education: Stepping on & off Airex alternating lead LE 5 reps with BUE support and with single UE 5 reps RUE & 5 reps LUE with min/CGA.   SLS on Airex stepping onto Airex and contralateral LE stepping to 8 step anteriorly then back to floor with BUE support and PT  CGA for safety. 5 reps 2 sets with ea LE.   Side stepping with PT cues for hip muscle engagement and weight shift. Progressed from BUE support to single UE support.  CGA for safety.   Alternating standing hip motions forward kick, abduction and ext - BUE support 5 reps, LUE support 5 reps & RUE support 5 reps.  CGA for safety.     TREATMENT:                                                                                                                             DATE:  02/09/2024: Prosthetic Care with left transtiial prosthesis and right blue rocker AFO  Pt amb 200' with rollator walker with CGA due to visual changes impairing his balance.  Side stepping with PT cues on technique for hip muscle activity to facilitate loading limb without lateral trunk lean.  Progressed BUE support to unilateral finger tip support.   Progressed to turning using above technique.   Backwards gait with PT demo & verbal cues on technique including weight shift over stance LE prior to stepping contralateral LE.    TherAct: Pt reviewed proper sit to/from stand technique working on not using LEs against chair for stabilization.  5 reps with 18 chair  with armrests, progressed to 18 chair without armrests 5 reps LUE on seat / RUE on locked rollator & 5 reps with RUE on seat / LUE on locked rollator with increased difficulty due to LE strength. 10 reps with 24 bar stool with UE assist with PT minA.     TREATMENT:                                                                                                                             DATE:  02/03/2024: Prosthetic Care with left transtiial prosthesis and right blue rocker AFO  PT and patient discussed ply sock wear and appropriate use as well as how to use tall kitchen chairs as the barstool when performing tasks within the home.  Patient with improved ambulation mechanics with rollator and controlled sit<>stands this date.  Neuro Re-Ed: rollator placed to Lt side, chair behind patient, and PT providing at least CGA throughout  Standing balance while reaching to light up pods on window  1x30s using Rt UE only while keeping Lt UE on rollator for stability; no LOB or increase in required assist  2x30s using Rt UE  only with no UE use on rollator for stability; intermittent increase of assist from PT to mod A for posterior LOB; patient able to self-correct lateral and fwd LOB with stepping reaction or UE use on window/rollator  2x30s using Lt UE only with no UE use on rollator for stability; intermittent increase of assist from PT to mod A for posterior LOB; patient able to self-correct lateral and fwd LOB with stepping reaction or UE use on window/rollator 2x30s using bilat UE for reaching with no UE use on rollator for stability; intermittent increase of assist from PT to mod A for posterior LOB; patient able to self-correct lateral and fwd LOB with stepping reaction or UE use on window/rollator  Patient continued to improve upon balance strategies with increased time and repetitions   TherAct: Bilat leg press 2x15 with 112#  Unilat leg press Rt LE 1x10 with 50#, Lt LE 1x8 with 31# Highly  fatigued as he did leg press at yesterday's session     HOME EXERCISE PROGRAM: Do each exercise 1-2  times per day Do each exercise 5-10 repetitions Hold each exercise for 2 seconds to feel your location  AT SINK FIND YOUR MIDLINE POSITION AND PLACE FEET EQUAL DISTANCE FROM THE MIDLINE.  Try to find this position when standing still for activities.   USE TAPE ON FLOOR TO MARK THE MIDLINE POSITION which is even with middle of sink.  You also should try to feel with your limb pressure in socket.  You are trying to feel with limb what you used to feel with the bottom of your foot.  Side to Side Shift: Moving your hips only (not shoulders): move weight onto your left leg, HOLD/FEEL pressure in socket.  Move back to equal weight on each leg, HOLD/FEEL pressure in socket. Move weight onto your right leg, HOLD/FEEL pressure in socket. Move back to equal weight on each leg, HOLD/FEEL pressure in socket. Repeat.  Start with both hands on sink, progress to hand on prosthetic side only Front to Back Shift: Moving your hips only (not shoulders): move your weight forward onto your toes, HOLD/FEEL pressure in socket. Move your weight back to equal Flat Foot on both legs, HOLD/FEEL  pressure in socket. Move your weight back onto your heels, HOLD/FEEL  pressure in socket. Move your weight back to equal on both legs, HOLD/FEEL  pressure in socket. Repeat.  Start with both hands on sink, progress to hand on prosthetic side only Moving Cones / Cups: With equal weight on each leg: Hold on with one hand the first time, then progress to no hand supports. Move cups from one side of sink to the other. Place cups ~2 out of your reach, progress to 10 beyond reach.  Place one hand in middle of sink and reach with other hand. Do both arms.  Overhead/Upward Reaching: alternated reaching up to top cabinets or ceiling if no cabinets present. Keep equal weight on each leg. Start with one hand support on counter while other hand  reaches and progress to no hand support with reaching.  ace one hand in middle of sink and reach with other hand. Do both arms.  5.   Looking Over Shoulders: With equal weight on each leg: alternate turning to look over your shoulders with one hand support on counter as needed.  Start with head motions only to look in front of shoulder, then even with shoulder and progress to looking behind you. To look to side, move head /eyes, then  shoulder on side looking pulls back, shift more weight to side looking and pull hip back. Place one hand in middle of sink and let go with other hand so your shoulder can pull back. Switch hands to look other way.   6.  Stepping into lower cabinet:  Move items under cabinet out of your way. Shift your hips/pelvis so weight on prosthesis. Tighten muscles in hip on prosthetic side.  SLOWLY step other leg so front of foot is in cabinet. Then step back to floor. Repeat with other leg.    ASSESSMENT:  CLINICAL IMPRESSION: PT began working with gait with a cane which he has a potentially to utilize for household activities but needs significant more work and physical therapy for safety prior to trying outside of PT.  Patient was able to utilize more weight on the leg press indicating an improvement in functional strength.  Patient will continue to benefit from skilled PT.  OBJECTIVE IMPAIRMENTS: Abnormal gait, decreased activity tolerance, decreased balance, decreased endurance, decreased knowledge of condition, decreased knowledge of use of DME, decreased mobility, difficulty walking, decreased strength, increased edema, postural dysfunction, prosthetic dependency , and impaired skin integrity.   ACTIVITY LIMITATIONS: carrying, lifting, bending, sitting, standing, stairs, transfers, reach over head, and locomotion level  PARTICIPATION LIMITATIONS: meal prep, driving, shopping, community activity, and yard work  PERSONAL FACTORS: Fitness, Time since onset of  injury/illness/exacerbation, and 3+ comorbidities: see PMH are also affecting patient's functional outcome.   REHAB POTENTIAL: Good  CLINICAL DECISION MAKING: Evolving/moderate complexity  EVALUATION COMPLEXITY: Moderate   GOALS: Goals reviewed with patient? Yes  SHORT TERM GOALS: Target date: 01/28/2024  Patient donnes prosthesis modified independent & verbalizes proper cleaning. Baseline: SEE OBJECTIVE DATA Goal status:   MET   01/28/2024 2.  Patient tolerates prosthesis >10 hrs total /day without skin issues or limb pain >5/10 after standing. Baseline: SEE OBJECTIVE DATA Goal status: MET 01/26/2024  3.  Patient able to reach 7, pick up object from floor and look over both shoulders with RW support with supervision. Baseline: SEE OBJECTIVE DATA Goal status: MET  01/28/2024  4. Patient ambulates 55' with RW & prosthesis with supervision. Baseline: SEE OBJECTIVE DATA Goal status: MET   01/28/2024  5. Patient negotiates ramps & curbs with RW & prosthesis with minA. Baseline: SEE OBJECTIVE DATA Goal status:  MET  01/19/2024  [place]aupert TERM GOALS: Target date: 03/25/2024  Patient demonstrates & verbalized understanding of prosthetic care to enable safe utilization of prosthesis. Baseline: SEE OBJECTIVE DATA Goal status: Ongoing  02/16/2024  Patient tolerates prosthesis wear >90% of awake hours without skin or limb pain issues. Baseline: SEE OBJECTIVE DATA Goal status: Ongoing     02/16/2024  Berg Balance >30/56 to indicate lower fall risk Baseline: SEE OBJECTIVE DATA Goal status: Ongoing    02/16/2024  Patient ambulates >300' with prosthesis & LRAD independently Baseline: SEE OBJECTIVE DATA Goal status: Ongoing    02/16/2024  Patient negotiates ramps, curbs & stairs with single rail with prosthesis & LRAD independently. Baseline: SEE OBJECTIVE DATA Goal status: Ongoing   02/16/2024  Patient reports Patient-Specific Activity Score average to >/= 5 to indicate  improvement in functional activities.   Baseline: SEE OBJECTIVE DATA Goal status: Ongoing   02/16/2024  PLAN:  PT FREQUENCY: 2x/week  PT DURATION: 12 weeks  PLANNED INTERVENTIONS: 97164- PT Re-evaluation, 97750- Physical Performance Testing, 97110-Therapeutic exercises, 97530- Therapeutic activity, [id number]- Neuromuscular re-education, 97535- Self Care, [id number]- Gait training, [phone]- Prosthetic Initial , [phone]- Orthotic/Prosthetic subsequent, Patient/Family education,  Balance training, Stair training, Scar mobilization, Vestibular training, and DME instructions  PLAN FOR NEXT SESSION:    continue with leg press,  Continue prosthetic gait with rollator or RW, prosthesis and AFO.  Standing balance activities.      Grayce Spatz, PT, DPT 02/16/2024, 4:06 PM

## 2024-02-17 ENCOUNTER — Ambulatory Visit (INDEPENDENT_AMBULATORY_CARE_PROVIDER_SITE_OTHER): Admitting: Medical

## 2024-02-17 VITALS — BP 130/80 | HR 68 | Wt 173.8 lb

## 2024-02-17 DIAGNOSIS — D649 Anemia, unspecified: Secondary | ICD-10-CM | POA: Diagnosis not present

## 2024-02-17 DIAGNOSIS — Z89512 Acquired absence of left leg below knee: Secondary | ICD-10-CM | POA: Diagnosis not present

## 2024-02-17 DIAGNOSIS — Z794 Long term (current) use of insulin: Secondary | ICD-10-CM

## 2024-02-17 DIAGNOSIS — Z1211 Encounter for screening for malignant neoplasm of colon: Secondary | ICD-10-CM

## 2024-02-17 DIAGNOSIS — H409 Unspecified glaucoma: Secondary | ICD-10-CM

## 2024-02-17 DIAGNOSIS — H269 Unspecified cataract: Secondary | ICD-10-CM | POA: Diagnosis not present

## 2024-02-17 DIAGNOSIS — E11319 Type 2 diabetes mellitus with unspecified diabetic retinopathy without macular edema: Secondary | ICD-10-CM

## 2024-02-17 DIAGNOSIS — E1142 Type 2 diabetes mellitus with diabetic polyneuropathy: Secondary | ICD-10-CM

## 2024-02-17 DIAGNOSIS — E1165 Type 2 diabetes mellitus with hyperglycemia: Secondary | ICD-10-CM

## 2024-02-17 DIAGNOSIS — E785 Hyperlipidemia, unspecified: Secondary | ICD-10-CM

## 2024-02-17 DIAGNOSIS — Z125 Encounter for screening for malignant neoplasm of prostate: Secondary | ICD-10-CM

## 2024-02-17 NOTE — Progress Notes (Signed)
 Subjective:  Scott Navarro. is a 58 y.o. male who presents for Chief Complaint  Patient presents with   Medical Management of Chronic Issues    Med check-  BS have been running around 95-130     Scott Navarro. is a 58 year old male who presents for medication management.  Unfortunately he had a foot infection earlier in the year back in May and he did have an MRI below the knee amputation on the left.  He is using a prosthesis and progressing pretty well with that.  He is still in physical therapy.  He has made significant progress in mobility following his below-knee amputation. He transitioned from using a wheelchair to a walker and now primarily uses a cane at home. He is not yet comfortable using the cane outside but anticipates further improvement. He attends physical therapy twice a week.  He underwent surgery for an infection above the knee, where the surgeon was able to save the knee. The surgical site was initially packed with antibiotics, and upon follow-up, the wound was healing well, allowing for closure without further packing. No pain, issues, or skin breakdown with the prosthetic, which he now wears full-time from 7 AM to 10 PM.  He is currently taking valsartan  40 mg daily and rosuvastatin  20 mg at night. He also takes iron supplements and several vitamins. He is on Mounjaro  for diabetes management, with blood glucose levels ranging from 90 to 125 mg/dL, and is pleased with the control it provides without additional medications.  He has cataracts in both eyes and is ready for a referral for treatment. He has not had a colonoscopy or Cologuard test before but is willing to do the stool test. There is no family history of colon cancer, but his grandfather had lung cancer and his uncle had bone cancer.  He has not received the shingles, flu, or pneumonia vaccines, citing a history of not being sick with the flu since his teenage years. His wife receives the flu vaccine  annually. He is considering the shingles vaccine due to his grandmother's painful experience with shingles.  He is living with his son Warren, who requires 24-hour care due to an aortic dissection.  No other aggravating or relieving factors.    No other c/o.  Past Medical History:  Diagnosis Date   Type II diabetes mellitus (HCC) dx'd 10/08/2016   Current Outpatient Medications on File Prior to Visit  Medication Sig Dispense Refill   acetaminophen  (TYLENOL ) 325 MG tablet Take 2 tablets (650 mg total) by mouth every 4 (four) hours as needed for mild pain (pain score 1-3). 100 tablet 0   ascorbic acid  (VITAMIN C ) 1000 MG tablet Take 1 tablet (1,000 mg total) by mouth daily. 100 tablet 0   cyanocobalamin  1000 MCG tablet Take 1 tablet (1,000 mcg total) by mouth daily. 100 tablet 0   dorzolamide-timolol (COSOPT) 2-0.5 % ophthalmic solution Place 1 drop into the left eye 2 (two) times daily.     ferrous sulfate  325 (65 FE) MG tablet Take 1 tablet (325 mg total) by mouth daily with breakfast. 30 tablet 0   magnesium  gluconate (MAGONATE) 500 (27 Mg) MG TABS tablet Take 1 tablet (500 mg total) by mouth daily. 30 tablet 0   rosuvastatin  (CRESTOR ) 20 MG tablet Take 1 tablet (20 mg total) by mouth at bedtime. 30 tablet 0   senna (SENOKOT) 8.6 MG TABS tablet Take 1 tablet (8.6 mg total) by mouth at bedtime  as needed for mild constipation. 120 tablet 0   tirzepatide  (MOUNJARO ) 15 MG/0.5ML Pen Inject 15 mg into the skin once a week. 2 mL 5   vitamin D3 (CHOLECALCIFEROL ) 25 MCG tablet Take 1 tablet (1,000 Units total) by mouth daily. 30 tablet 0   Zinc  Sulfate 220 (50 Zn) MG TABS Take 1 tablet (220 mg total) by mouth daily with supper. 100 tablet 0   Oxycodone  HCl 10 MG TABS Take 1 tablet (10 mg total) by mouth every 6 (six) hours as needed for severe pain (pain score 7-10). (Patient not taking: Reported on 02/17/2024) 28 tablet 0   valsartan  (DIOVAN ) 40 MG tablet Take 40 mg by mouth daily.     No current  facility-administered medications on file prior to visit.   The following portions of the patient's history were reviewed and updated as appropriate: allergies, current medications, past family history, past medical history, past social history, past surgical history and problem list.  ROS Otherwise as in subjective above    Objective: BP 130/80   Pulse 68   Wt 173 lb 12.8 oz (78.8 kg)   BMI 23.57 kg/m   General appearance: alert, no distress, well developed, well nourished Neck: supple, no lymphadenopathy, no thyromegaly, no masses Heart: RRR, normal S1, S2, no murmurs Lungs: CTA bilaterally, no wheezes, rhonchi, or rales Pulses: 2+ radial pulses, 2+ pedal pulses, normal cap refill Ext: no edema Prosthesis present left leg with BTA    Assessment: Encounter Diagnoses  Name Primary?   Type 2 diabetes mellitus with hyperglycemia, with Sanker-term current use of insulin  (HCC) Yes   S/P BKA (below knee amputation), left (HCC)    Diabetic polyneuropathy associated with type 2 diabetes mellitus (HCC)    Diabetic retinopathy associated with type 2 diabetes mellitus, macular edema presence unspecified, unspecified laterality, unspecified retinopathy severity (HCC)    Glaucoma, unspecified glaucoma type, unspecified laterality    Hyperlipidemia, unspecified hyperlipidemia type    Cataract of both eyes, unspecified cataract type    Screening for colon cancer    Anemia, unspecified type    Screening for prostate cancer      Plan: Type 2 diabetes mellitus with neuropathy and retinopathy - Continue Mounjaro  15 mg. - updated labs today  Acquired absence of left leg below knee Progressing well with prosthetic. Transitioned from wheelchair to walker and cane, currently using cane at home. No pain or skin breakdown. - Continue current prosthetic use and mobility aids as tolerated.  Cataracts, bilateral, glaucoma Bilateral cataracts present, ready for surgical referral. - follow up  with ophthalmologist   Hyperlipidemia Managed with rosuvastatin  20 mg. Last cholesterol check was satisfactory. - Continue rosuvastatin  20 mg at night.  Anemia Managed with iron supplementation. Last iron level was low. Stool softener prescribed for constipation from iron. - Continue iron supplementation. - Ordered iron level. - Continue stool softener as needed.  Screening for prostate and colon cancer - screening orders placed   Symeon was seen today for medical management of chronic issues.  Diagnoses and all orders for this visit:  Type 2 diabetes mellitus with hyperglycemia, with Ontko-term current use of insulin  (HCC) -     Comprehensive metabolic panel with GFR -     Hemoglobin A1c -     Microalbumin/Creatinine Ratio, Urine -     Urinalysis, Routine w reflex microscopic  S/P BKA (below knee amputation), left (HCC)  Diabetic polyneuropathy associated with type 2 diabetes mellitus (HCC)  Diabetic retinopathy associated with type 2 diabetes  mellitus, macular edema presence unspecified, unspecified laterality, unspecified retinopathy severity (HCC)  Glaucoma, unspecified glaucoma type, unspecified laterality  Hyperlipidemia, unspecified hyperlipidemia type  Cataract of both eyes, unspecified cataract type  Screening for colon cancer -     Cologuard  Anemia, unspecified type -     CBC -     Iron, TIBC and Ferritin Panel  Screening for prostate cancer -     PSA    Follow up: pending labs

## 2024-02-18 ENCOUNTER — Ambulatory Visit: Payer: Self-pay | Admitting: Medical

## 2024-02-18 ENCOUNTER — Other Ambulatory Visit: Payer: Self-pay | Admitting: Medical

## 2024-02-18 ENCOUNTER — Encounter: Payer: Self-pay | Admitting: Physical Therapy

## 2024-02-18 ENCOUNTER — Ambulatory Visit: Admitting: Physical Therapy

## 2024-02-18 DIAGNOSIS — M21371 Foot drop, right foot: Secondary | ICD-10-CM

## 2024-02-18 DIAGNOSIS — M6281 Muscle weakness (generalized): Secondary | ICD-10-CM | POA: Diagnosis not present

## 2024-02-18 DIAGNOSIS — R2681 Unsteadiness on feet: Secondary | ICD-10-CM

## 2024-02-18 DIAGNOSIS — R2689 Other abnormalities of gait and mobility: Secondary | ICD-10-CM

## 2024-02-18 LAB — MICROSCOPIC EXAMINATION
Bacteria, UA: NONE SEEN
Casts: NONE SEEN /LPF

## 2024-02-18 LAB — IRON,TIBC AND FERRITIN PANEL
Ferritin: 255 ng/mL (ref 30–400)
Iron Saturation: 25 % (ref 15–55)
Iron: 75 ug/dL (ref 38–169)
Total Iron Binding Capacity: 295 ug/dL (ref 250–450)
UIBC: 220 ug/dL (ref 111–343)

## 2024-02-18 LAB — URINALYSIS, ROUTINE W REFLEX MICROSCOPIC
Bilirubin, UA: NEGATIVE
Glucose, UA: NEGATIVE
Ketones, UA: NEGATIVE
Leukocytes,UA: NEGATIVE
Nitrite, UA: NEGATIVE
RBC, UA: NEGATIVE
Specific Gravity, UA: 1.019 (ref 1.005–1.030)
Urobilinogen, Ur: 0.2 mg/dL (ref 0.2–1.0)
pH, UA: 5.5 (ref 5.0–7.5)

## 2024-02-18 LAB — COMPREHENSIVE METABOLIC PANEL WITH GFR
ALT: 22 IU/L (ref 0–44)
AST: 24 IU/L (ref 0–40)
Albumin: 4 g/dL (ref 3.8–4.9)
Alkaline Phosphatase: 67 IU/L (ref 47–123)
BUN/Creatinine Ratio: 25 — ABNORMAL HIGH (ref 9–20)
BUN: 23 mg/dL (ref 6–24)
Bilirubin Total: 0.3 mg/dL (ref 0.0–1.2)
CO2: 28 mmol/L (ref 20–29)
Calcium: 9.4 mg/dL (ref 8.7–10.2)
Chloride: 101 mmol/L (ref 96–106)
Creatinine, Ser: 0.91 mg/dL (ref 0.76–1.27)
Globulin, Total: 2.8 g/dL (ref 1.5–4.5)
Glucose: 121 mg/dL — ABNORMAL HIGH (ref 70–99)
Potassium: 4.5 mmol/L (ref 3.5–5.2)
Sodium: 139 mmol/L (ref 134–144)
Total Protein: 6.8 g/dL (ref 6.0–8.5)
eGFR: 97 mL/min/1.73 (ref >59)

## 2024-02-18 LAB — CBC
Hematocrit: 33.6 % — ABNORMAL LOW (ref 37.5–51.0)
Hemoglobin: 11 g/dL — ABNORMAL LOW (ref 13.0–17.7)
MCH: 30.9 pg (ref 26.6–33.0)
MCHC: 32.7 g/dL (ref 31.5–35.7)
MCV: 94 fL (ref 79–97)
Platelets: 276 x10E3/uL (ref 150–450)
RBC: 3.56 x10E6/uL — ABNORMAL LOW (ref 4.14–5.80)
RDW: 12.9 % (ref 11.6–15.4)
WBC: 9.6 x10E3/uL (ref 3.4–10.8)

## 2024-02-18 LAB — MICROALBUMIN / CREATININE URINE RATIO
Creatinine, Urine: 73.3 mg/dL
Microalb/Creat Ratio: 2353 mg/g{creat} — ABNORMAL HIGH (ref 0–29)
Microalbumin, Urine: 1725.1 ug/mL

## 2024-02-18 LAB — HEMOGLOBIN A1C
Est. average glucose Bld gHb Est-mCnc: 123 mg/dL
Hgb A1c MFr Bld: 5.9 % — ABNORMAL HIGH (ref 4.8–5.6)

## 2024-02-18 LAB — PSA: Prostate Specific Ag, Serum: 0.4 ng/mL (ref 0.0–4.0)

## 2024-02-18 MED ORDER — FERROUS SULFATE 325 (65 FE) MG PO TABS: 325.0000 mg | ORAL_TABLET | Freq: Every day | ORAL | 1 refills | Status: AC

## 2024-02-18 MED ORDER — VALSARTAN 40 MG PO TABS: 40.0000 mg | ORAL_TABLET | Freq: Every day | ORAL | 3 refills | Status: AC

## 2024-02-18 MED ORDER — MOUNJARO 15 MG/0.5ML ~~LOC~~ SOAJ: 15.0000 mg | SUBCUTANEOUS | 5 refills | Status: AC

## 2024-02-18 NOTE — Progress Notes (Signed)
 Results to MyChart

## 2024-02-18 NOTE — Therapy (Signed)
 OUTPATIENT PHYSICAL THERAPY PROSTHETIC TREATMENT   Patient Name: Scott Navarro. MRN: [id number] DOB:1965/04/05, 58 y.o., male Today's Date: 02/18/2024  END OF SESSION:  PT End of Session - 02/18/24 1147     Visit Number 15    Number of Visits 25    Date for Recertification  03/25/24    Authorization Type [employer] other    Authorization - Visit Number 15    Authorization - Number of Visits 90    PT Start Time 1147    PT Stop Time 1231    PT Time Calculation (min) 44 min    Equipment Utilized During Treatment Gait belt    Activity Tolerance Patient tolerated treatment well    Behavior During Therapy WFL for tasks assessed/performed           Past Medical History:  Diagnosis Date   Type II diabetes mellitus (HCC) dx'd 10/08/2016   Past Surgical History:  Procedure Laterality Date   AMPUTATION Right 10/09/2016   Procedure: INCISION AND DRAINAGE RIGHT GREAT TOE;  Surgeon: Harden Jerona GAILS, MD;  Location: [facility] OR;  Service: Orthopedics;  Laterality: Right;   AMPUTATION Left 07/23/2023   Procedure: AMPUTATION, BELOW KNEE, LEFT;  Surgeon: Harden Jerona GAILS, MD;  Location: [facility] OR;  Service: Orthopedics;  Laterality: Left;  IRRIGATION AND DEBRIDEMENT AND AMPUTATION OF LEFT LEG   IRRIGATION AND DEBRIDEMENT KNEE Left 07/25/2023   Procedure: IRRIGATION AND DEBRIDEMENT LEFT KNEE;  Surgeon: Harden Jerona GAILS, MD;  Location: [facility] OR;  Service: Orthopedics;  Laterality: Left;  DEBRIDEMENT LEFT LEG   Patient Active Problem List   Diagnosis Date Noted   Coping style affecting medical condition 08/05/2023   S/P BKA (below knee amputation), left (HCC) 07/30/2023   ARF (acute renal failure) 07/23/2023   Hyperlipidemia 07/23/2023   Necrotizing fasciitis (HCC) 07/22/2023   Anemia 07/22/2023   Diabetic retinopathy associated with type 2 diabetes mellitus (HCC) 02/18/2023   Charcot joint of left foot 02/18/2023   Encounter for lipid screening for cardiovascular disease 02/18/2023   Screening for prostate  cancer 02/18/2023   Type 2 diabetes mellitus with hyperglycemia (HCC) 11/28/2022   Gait instability 11/28/2022   Retinal hemorrhage 11/21/2022   Glaucoma 11/21/2022   Achilles tendon contracture, right 11/21/2016   Class 1 obesity due to excess calories with body mass index (BMI) of 33.0 to 33.9 in adult    Diabetic polyneuropathy associated with type 2 diabetes mellitus (HCC)     PCP: Bulah Alm RAMAN, PA-C  REFERRING PROVIDER: Maurilio CHRISTELLA Collet, PA-C  ONSET DATE:  12/26/2023 prosthesis delivery  REFERRING DIAG: S10.487 (ICD-10-CM) - S/P BKA (below knee amputation), left   THERAPY DIAG:  Other abnormalities of gait and mobility  Unsteadiness on feet  Muscle weakness (generalized)  Foot drop, right  Rationale for Evaluation and Treatment: Rehabilitation  SUBJECTIVE:   SUBJECTIVE STATEMENT: He saw DM doctor who feels that he is doing well.  His local eye doctor who referred to eye surgeon for cataract work up for right eye.    PERTINENT HISTORY: left TTA 07/23/23, blind in right eye, DM2, polyneuropathy, Acute renal failure, HLD  PAIN:  Are you having pain? No  PRECAUTIONS: Fall  RED FLAGS: None   WEIGHT BEARING RESTRICTIONS: No  FALLS: Has patient fallen in last 6 months? No  LIVING ENVIRONMENT: Lives with: lives with spouse and mother with Alzheimer's. He is currently living with son who had recent medical issue making him bed / wheelchair bound.  His wife &  he are son's caregivers but think is not permanent situation.   Lives in: House his house is 2 story development worker, community / bedroom main level) and son's house is single level (where they are currently staying).  Home Access: 3 steps to main entrance to his home and son's house has ramp.  Stairs: Yes: Internal: 14 steps; on right going up and External: 3 steps; on left going up Has following equipment at home: Single point cane, Walker - 2 wheeled, Wheelchair (manual), Shower bench, and bed side commode  PLOF:  Independent, Independent with household mobility without device, and Independent with community mobility without device  Occupation: retired  PATIENT GOALS: to use prosthesis normal activities: driving, shopping, yard work,  active at place in [place],   OBJECTIVE:  Patient-Specific Activity Scoring Scheme  0 represents unable to perform. 10 represents able to perform at prior level. 0 1 2 3 4 5 6 7 8 9  10 (Date and Score)   Activity Eval 12/29/23 02/18/24   1. Care & wear of prosthesis  1 7   2. Standing ADLs   2 5   3. Walking  2 5  4. Outdoor activities / curb / ramps/ stairs / uneven 0 2  5.    Score 1.25 4.75   Total score = sum of the activity scores/number of activities Minimum detectable change (90%CI) for average score = 2 points Minimum detectable change (90%CI) for single activity score = 3 points  COGNITION: Evaluation on 12/29/2023: Overall cognitive status: Within functional limits for tasks assessed   SENSATION: Evaluation on 12/29/2023: [facility]  CARDIOVASCULAR RESPONSE: Evaluation on 12/29/2023: Functional activity: standing balance & gait Post-activity vitals: HR: 116 SpO2: 100% Modified Borg scale for dyspnea: 2: mild shortness of breath  POSTURE:  Evaluation on 12/29/2023: rounded shoulders, forward head, flexed trunk , and weight shift right  LOWER EXTREMITY ROM:  ROM P:passive  A:active Left Eval 12/29/23  Hip flexion   Hip extension   Hip abduction   Hip adduction   Hip internal rotation   Hip external rotation   Knee flexion   Knee extension 0*  Ankle dorsiflexion   Ankle plantarflexion   Ankle inversion   Ankle eversion    (Blank rows = not tested)  LOWER EXTREMITY MMT:  MMT Right Eval 12/29/23 Left Eval 12/2723  Hip flexion    Hip extension 3/5   Hip abduction 3/5   Hip adduction    Hip internal rotation    Hip external rotation    Knee flexion 3/5   Knee extension 3/5 4/5  Ankle dorsiflexion  2-/5   Ankle plantarflexion    Ankle inversion    Ankle eversion    At Evaluation all strength testing is grossly seated and functionally standing / gait. (Blank rows = not tested)  TRANSFERS: Evaluation on 12/29/2023: Sit to stand: SBA from 22 w/c requires use of armrests and back of legs against chair &/or RW for stabilization. Stand to sit: SBA RW to 22 w/c requires use of armrests and back of legs against chair &/or RW for stabilization.  FUNCTIONAL TESTs:  01/28/2024:  with locked rollator walker support & PT supervision: pt able to reach 7 anteriorly, pick up item from floor and look over his shoulders.  Evaluation on 12/29/2023: Lars Balance 12/56  [facility] PT Assessment - 12/29/23 0900       Standardized Balance Assessment   Standardized Balance Assessment Berg Balance Test      [facility] Balance Test  Sit to Stand Needs minimal aid to stand or to stabilize    Standing Unsupported Needs several tries to stand 30 seconds unsupported    Sitting with Back Unsupported but Feet Supported on Floor or Stool Able to sit safely and securely 2 minutes    Stand to Sit Controls descent by using hands    Transfers Able to transfer safely, definite need of hands    Standing Unsupported with Eyes Closed Needs help to keep from falling    Standing Unsupported with Feet Together Needs help to attain position and unable to hold for 15 seconds    From Standing, Reach Forward with Outstretched Arm Loses balance while trying/requires external support    From Standing Position, Pick up Object from Floor Unable to try/needs assist to keep balance    From Standing Position, Turn to Look Behind Over each Shoulder Needs assist to keep from losing balance and falling    Turn 360 Degrees Needs assistance while turning    Standing Unsupported, Alternately Place Feet on Step/Stool Needs assistance to keep from falling or unable to try    Standing Unsupported, One Foot in [employer] balance while stepping or  standing    Standing on One Leg Unable to try or needs assist to prevent fall    Total Score 12    Berg comment: < 36 high risk for falls (close to 100%) 46-51 moderate (>50%)   37-45 significant (>80%) 52-55 lower (> 25%)          GAIT: 01/28/2024: Pt amb 150' with rollator walker with supervision.  PT neg ramp & curb with rollator walker with min/CGA.  Evaluation on 12/29/2023: Distance walked: 60' Assistive device utilized: [employer] - 2 wheeled and TTA prosthesis Level of assistance: Min A (multiple balance losses requiring PT assist to stabilize especially in turns) Gait pattern: step through pattern, decreased step length- Right, decreased stance time- Left, decreased hip/knee flexion- Left, decreased ankle dorsiflexion- Right, Right hip hike, Left hip hike, genu recurvatum- Right, antalgic, trendelenburg, lateral hip instability, trunk flexed, and poor foot clearance- Right, left knee instability / flexion moment intermittently  Gait velocity:  0.97 ft/sec Comments: excess UE weight bearing on RW.    CURRENT PROSTHETIC WEAR ASSESSMENT: Evaluation on 12/29/2023:  Patient is dependent with: skin check, residual limb care, care of non-amputated limb, prosthetic cleaning, ply sock cleaning, correct ply sock adjustment, proper wear schedule/adjustment, and proper weight-bearing schedule/adjustment Donning prosthesis: SBA Doffing prosthesis: SBA Prosthetic wear tolerance: up to 1.5 hours, 2x/day, 2 of 3 days since delivery Prosthetic weight bearing tolerance: 5 minutes with no c/o residual limb pain Edema: pitting Residual limb condition: cylindrical shape, 3 cm superficial scab at anterior-lateral limb with no signs of infection, small wound on lateral limb proximal to fibular head with white covering 0.4 cm, invaginated scar lateral aspect, dry flaking skin, no hair growth, redness over patella (friction from liner when seated), normal temperature.  Prosthetic description: silicon (9 mm  anterior portion) with pin lock suspension, total contact socket with flexible inner socket.    TODAY'S TREATMENT:  DATE:  02/18/2024: Prosthetic Care with left transtiial prosthesis and right blue rocker AFO  Patient ambulated 80' x 3 with cane stand-alone tip and handhold assist /minA.  PT cueing on upright posture, sequencing and balance reactions. PT adjusted patient's cane.    TherAct: Leg press BLEs 125# 15 reps 2 sets; RLE 62# 10 reps 2 sets; LLE 50# 10 reps 2 sets Standing red Theraband kicks with BUE support of cane & chair back 10 reps with abd, ext, add & straight leg flex with ea LE with PT min/CGA.    Neuromuscular Re-education: Side stepping right & left and backwards gait in //bars - 1st lap of each with //bar & cane, then 3 laps with cane & Min to modA for balance.  Standing bilateral stance with cane RUE support:  head turns right/left, up/down & diagonals with minA / tactile cues for balance.  And static stance eyes closed 10 sec 3 reps with minA / tactile cues for balance.   TREATMENT:                                                                                                                             DATE:  02/16/2024: Prosthetic Care with left transtiial prosthesis and right blue rocker AFO  Patient ambulated 80' x 4 with cane stand-alone tip and handhold assist /min to mod A.  PT cueing on upright posture, sequencing and balance reactions. PT educated patient on difference in cane designs as he plans to purchase one.  Patient verbalized understanding.  TherAct: Leg press BLEs 125# 15 reps 2 sets; RLE 62# 10 reps 2 sets except last 4 reps had reduced weight to 50#; LLE 50# 10 reps 2 sets  Neuromuscular Re-education: 4 square stepping CW and CCW with cane with mod to maxA for balance issues. Standing UE resistance blue Thera-Band- single UE  10 reps ea UE with contralateral UE support on locked rollator, 2nd set single UE without UE support with PT minA/tactile cues, then 10 reps BUEs  without UE support with PT minA/tactile cues.    TREATMENT:                                                                                                                             DATE:  02/11/2024: Prosthetic Care with left transtiial prosthesis and right blue rocker AFO  Pt amb 200' with rollator walker with CGA due to visual changes  impairing his balance.  Side stepping with PT cues on technique for hip muscle activity to facilitate loading limb without lateral trunk lean.  Progressed BUE support to unilateral finger tip support.   Progressed to turning using above technique.   Backwards gait with rollator walker with PT demo & verbal cues on technique including weight shift over stance LE prior to stepping contralateral LE.  Pt performed 20' X 3 with min/CGA.   Ambulating with rollator walker with eyes closed 20' X 3 working on ambulating in dark with min/CGA.   TherAct: Sit to / from stand 10 reps 2 sets with 24 bar stool with UE assist with PT minA.   Neuromuscular Re-education: Stepping on & off Airex alternating lead LE 5 reps with BUE support and with single UE 5 reps RUE & 5 reps LUE with min/CGA.   SLS on Airex stepping onto Airex and contralateral LE stepping to 8 step anteriorly then back to floor with BUE support and PT CGA for safety. 5 reps 2 sets with ea LE.   Side stepping with PT cues for hip muscle engagement and weight shift. Progressed from BUE support to single UE support.  CGA for safety.   Alternating standing hip motions forward kick, abduction and ext - BUE support 5 reps, LUE support 5 reps & RUE support 5 reps.  CGA for safety.     TREATMENT:                                                                                                                             DATE:  02/09/2024: Prosthetic Care with left  transtiial prosthesis and right blue rocker AFO  Pt amb 200' with rollator walker with CGA due to visual changes impairing his balance.  Side stepping with PT cues on technique for hip muscle activity to facilitate loading limb without lateral trunk lean.  Progressed BUE support to unilateral finger tip support.   Progressed to turning using above technique.   Backwards gait with PT demo & verbal cues on technique including weight shift over stance LE prior to stepping contralateral LE.    TherAct: Pt reviewed proper sit to/from stand technique working on not using LEs against chair for stabilization.  5 reps with 18 chair with armrests, progressed to 18 chair without armrests 5 reps LUE on seat / RUE on locked rollator & 5 reps with RUE on seat / LUE on locked rollator with increased difficulty due to LE strength. 10 reps with 24 bar stool with UE assist with PT minA.    HOME EXERCISE PROGRAM: Do each exercise 1-2  times per day Do each exercise 5-10 repetitions Hold each exercise for 2 seconds to feel your location  AT SINK FIND YOUR MIDLINE POSITION AND PLACE FEET EQUAL DISTANCE FROM THE MIDLINE.  Try to find this position when standing still for activities.   USE TAPE ON FLOOR TO MARK THE MIDLINE POSITION which is even with  middle of sink.  You also should try to feel with your limb pressure in socket.  You are trying to feel with limb what you used to feel with the bottom of your foot.  Side to Side Shift: Moving your hips only (not shoulders): move weight onto your left leg, HOLD/FEEL pressure in socket.  Move back to equal weight on each leg, HOLD/FEEL pressure in socket. Move weight onto your right leg, HOLD/FEEL pressure in socket. Move back to equal weight on each leg, HOLD/FEEL pressure in socket. Repeat.  Start with both hands on sink, progress to hand on prosthetic side only Front to Back Shift: Moving your hips only (not shoulders): move your weight forward onto your toes,  HOLD/FEEL pressure in socket. Move your weight back to equal Flat Foot on both legs, HOLD/FEEL  pressure in socket. Move your weight back onto your heels, HOLD/FEEL  pressure in socket. Move your weight back to equal on both legs, HOLD/FEEL  pressure in socket. Repeat.  Start with both hands on sink, progress to hand on prosthetic side only Moving Cones / Cups: With equal weight on each leg: Hold on with one hand the first time, then progress to no hand supports. Move cups from one side of sink to the other. Place cups ~2 out of your reach, progress to 10 beyond reach.  Place one hand in middle of sink and reach with other hand. Do both arms.  Overhead/Upward Reaching: alternated reaching up to top cabinets or ceiling if no cabinets present. Keep equal weight on each leg. Start with one hand support on counter while other hand reaches and progress to no hand support with reaching.  ace one hand in middle of sink and reach with other hand. Do both arms.  5.   Looking Over Shoulders: With equal weight on each leg: alternate turning to look over your shoulders with one hand support on counter as needed.  Start with head motions only to look in front of shoulder, then even with shoulder and progress to looking behind you. To look to side, move head /eyes, then shoulder on side looking pulls back, shift more weight to side looking and pull hip back. Place one hand in middle of sink and let go with other hand so your shoulder can pull back. Switch hands to look other way.   6.  Stepping into lower cabinet:  Move items under cabinet out of your way. Shift your hips/pelvis so weight on prosthesis. Tighten muscles in hip on prosthetic side.  SLOWLY step other leg so front of foot is in cabinet. Then step back to floor. Repeat with other leg.    ASSESSMENT:  CLINICAL IMPRESSION: Pt needs PT assist for balance activities and gait with cane.  His functional strength continues to improve.   Patient will continue to  benefit from skilled PT.  OBJECTIVE IMPAIRMENTS: Abnormal gait, decreased activity tolerance, decreased balance, decreased endurance, decreased knowledge of condition, decreased knowledge of use of DME, decreased mobility, difficulty walking, decreased strength, increased edema, postural dysfunction, prosthetic dependency , and impaired skin integrity.   ACTIVITY LIMITATIONS: carrying, lifting, bending, sitting, standing, stairs, transfers, reach over head, and locomotion level  PARTICIPATION LIMITATIONS: meal prep, driving, shopping, community activity, and yard work  PERSONAL FACTORS: Fitness, Time since onset of injury/illness/exacerbation, and 3+ comorbidities: see PMH are also affecting patient's functional outcome.   REHAB POTENTIAL: Good  CLINICAL DECISION MAKING: Evolving/moderate complexity  EVALUATION COMPLEXITY: Moderate   GOALS: Goals reviewed with patient?  Yes  SHORT TERM GOALS: Target date: 01/28/2024  Patient donnes prosthesis modified independent & verbalizes proper cleaning. Baseline: SEE OBJECTIVE DATA Goal status:   MET   01/28/2024 2.  Patient tolerates prosthesis >10 hrs total /day without skin issues or limb pain >5/10 after standing. Baseline: SEE OBJECTIVE DATA Goal status: MET 01/26/2024  3.  Patient able to reach 7, pick up object from floor and look over both shoulders with RW support with supervision. Baseline: SEE OBJECTIVE DATA Goal status: MET  01/28/2024  4. Patient ambulates 16' with RW & prosthesis with supervision. Baseline: SEE OBJECTIVE DATA Goal status: MET   01/28/2024  5. Patient negotiates ramps & curbs with RW & prosthesis with minA. Baseline: SEE OBJECTIVE DATA Goal status:  MET  01/19/2024  [place] TERM GOALS: Target date: 03/25/2024  Patient demonstrates & verbalized understanding of prosthetic care to enable safe utilization of prosthesis. Baseline: SEE OBJECTIVE DATA Goal status: Ongoing  02/16/2024  Patient tolerates  prosthesis wear >90% of awake hours without skin or limb pain issues. Baseline: SEE OBJECTIVE DATA Goal status: Ongoing     02/16/2024  Berg Balance >30/56 to indicate lower fall risk Baseline: SEE OBJECTIVE DATA Goal status: Ongoing    02/16/2024  Patient ambulates >300' with prosthesis & LRAD independently Baseline: SEE OBJECTIVE DATA Goal status: Ongoing    02/16/2024  Patient negotiates ramps, curbs & stairs with single rail with prosthesis & LRAD independently. Baseline: SEE OBJECTIVE DATA Goal status: Ongoing   02/16/2024  Patient reports Patient-Specific Activity Score average to >/= 5 to indicate improvement in functional activities.   Baseline: SEE OBJECTIVE DATA Goal status: Ongoing   02/16/2024  PLAN:  PT FREQUENCY: 2x/week  PT DURATION: 12 weeks  PLANNED INTERVENTIONS: [id number]- PT Re-evaluation, 97750- Physical Performance Testing, 97110-Therapeutic exercises, 97530- Therapeutic activity, [id number]- Neuromuscular re-education, [phone]- Self Care, [id number]- Gait training, [phone]- Prosthetic Initial , [phone]- Orthotic/Prosthetic subsequent, Patient/Family education, Balance training, Stair training, Scar mobilization, Vestibular training, and DME instructions  PLAN FOR NEXT SESSION:   continue with leg press,  Continue prosthetic gait with rollator or RW, prosthesis and AFO.  Standing balance activities.      Grayce Spatz, PT, DPT 02/18/2024, 12:59 PM

## 2024-02-23 ENCOUNTER — Ambulatory Visit: Admitting: Physical Therapy

## 2024-02-23 ENCOUNTER — Encounter: Payer: Self-pay | Admitting: Physical Therapy

## 2024-02-23 DIAGNOSIS — M6281 Muscle weakness (generalized): Secondary | ICD-10-CM | POA: Diagnosis not present

## 2024-02-23 DIAGNOSIS — L98498 Non-pressure chronic ulcer of skin of other sites with other specified severity: Secondary | ICD-10-CM

## 2024-02-23 DIAGNOSIS — R2681 Unsteadiness on feet: Secondary | ICD-10-CM

## 2024-02-23 DIAGNOSIS — M21371 Foot drop, right foot: Secondary | ICD-10-CM | POA: Diagnosis not present

## 2024-02-23 DIAGNOSIS — R2689 Other abnormalities of gait and mobility: Secondary | ICD-10-CM | POA: Diagnosis not present

## 2024-02-23 NOTE — Therapy (Signed)
 "  OUTPATIENT PHYSICAL THERAPY PROSTHETIC TREATMENT   Patient Name: Scott Navarro. MRN: [id number] DOB:10-27-1965, 58 y.o., male Today's Date: 02/23/2024  END OF SESSION:  PT End of Session - 02/23/24 1104     Visit Number 16    Number of Visits 25    Date for Recertification  03/25/24    Authorization Type [employer] other    Authorization - Visit Number 16    Authorization - Number of Visits 90    PT Start Time 1058    PT Stop Time 1144    PT Time Calculation (min) 46 min    Equipment Utilized During Treatment Gait belt    Activity Tolerance Patient tolerated treatment well    Behavior During Therapy WFL for tasks assessed/performed            Past Medical History:  Diagnosis Date   Type II diabetes mellitus (HCC) dx'd 10/08/2016   Past Surgical History:  Procedure Laterality Date   AMPUTATION Right 10/09/2016   Procedure: INCISION AND DRAINAGE RIGHT GREAT TOE;  Surgeon: Harden Jerona GAILS, MD;  Location: [facility] OR;  Service: Orthopedics;  Laterality: Right;   AMPUTATION Left 07/23/2023   Procedure: AMPUTATION, BELOW KNEE, LEFT;  Surgeon: Harden Jerona GAILS, MD;  Location: [facility] OR;  Service: Orthopedics;  Laterality: Left;  IRRIGATION AND DEBRIDEMENT AND AMPUTATION OF LEFT LEG   IRRIGATION AND DEBRIDEMENT KNEE Left 07/25/2023   Procedure: IRRIGATION AND DEBRIDEMENT LEFT KNEE;  Surgeon: Harden Jerona GAILS, MD;  Location: [facility] OR;  Service: Orthopedics;  Laterality: Left;  DEBRIDEMENT LEFT LEG   Patient Active Problem List   Diagnosis Date Noted   Coping style affecting medical condition 08/05/2023   S/P BKA (below knee amputation), left (HCC) 07/30/2023   ARF (acute renal failure) 07/23/2023   Hyperlipidemia 07/23/2023   Necrotizing fasciitis (HCC) 07/22/2023   Anemia 07/22/2023   Diabetic retinopathy associated with type 2 diabetes mellitus (HCC) 02/18/2023   Charcot joint of left foot 02/18/2023   Encounter for lipid screening for cardiovascular disease 02/18/2023   Screening for prostate  cancer 02/18/2023   Type 2 diabetes mellitus with hyperglycemia (HCC) 11/28/2022   Gait instability 11/28/2022   Retinal hemorrhage 11/21/2022   Glaucoma 11/21/2022   Achilles tendon contracture, right 11/21/2016   Class 1 obesity due to excess calories with body mass index (BMI) of 33.0 to 33.9 in adult    Diabetic polyneuropathy associated with type 2 diabetes mellitus (HCC)     PCP: Bulah Alm RAMAN, PA-C  REFERRING PROVIDER: Maurilio CHRISTELLA Collet, PA-C  ONSET DATE:  12/26/2023 prosthesis delivery  REFERRING DIAG: S10.487 (ICD-10-CM) - S/P BKA (below knee amputation), left   THERAPY DIAG:  Other abnormalities of gait and mobility  Unsteadiness on feet  Muscle weakness (generalized)  Foot drop, right  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  His vision is 80% of where he was prior to recent issue.  No falls. He is getting around house better.    PERTINENT HISTORY: left TTA 07/23/23, blind in right eye, DM2, polyneuropathy, Acute renal failure, HLD  PAIN:  Are you having pain? No  PRECAUTIONS: Fall  RED FLAGS: None   WEIGHT BEARING RESTRICTIONS: No  FALLS: Has patient fallen in last 6 months? No  LIVING ENVIRONMENT: Lives with: lives with spouse and mother with Alzheimer's. He is currently living with son  who had recent medical issue making him bed / wheelchair bound.  His wife & he are son's caregivers but think is not permanent situation.   Lives in: House his house is 2 story development worker, community / bedroom main level) and son's house is single level (where they are currently staying).  Home Access: 3 steps to main entrance to his home and son's house has ramp.  Stairs: Yes: Internal: 14 steps; on right going up and External: 3 steps; on left going up Has following equipment at home: Single point cane, Walker - 2  wheeled, Wheelchair (manual), Shower bench, and bed side commode  PLOF: Independent, Independent with household mobility without device, and Independent with community mobility without device  Occupation: retired  PATIENT GOALS: to use prosthesis normal activities: driving, shopping, yard work,  active at place in [employer],   OBJECTIVE:  Patient-Specific Activity Scoring Scheme  0 represents unable to perform. 10 represents able to perform at prior level. 0 1 2 3 4 5 6 7 8 9  10 (Date and Score)   Activity Eval 12/29/23 02/18/24   1. Care & wear of prosthesis  1 7   2. Standing ADLs   2 5   3. Walking  2 5  4. Outdoor activities / curb / ramps/ stairs / uneven 0 2  5.    Score 1.25 4.75   Total score = sum of the activity scores/number of activities Minimum detectable change (90%CI) for average score = 2 points Minimum detectable change (90%CI) for single activity score = 3 points  COGNITION: Evaluation on 12/29/2023: Overall cognitive status: Within functional limits for tasks assessed   SENSATION: Evaluation on 12/29/2023: [facility]  CARDIOVASCULAR RESPONSE: Evaluation on 12/29/2023: Functional activity: standing balance & gait Post-activity vitals: HR: 116 SpO2: 100% Modified Borg scale for dyspnea: 2: mild shortness of breath  POSTURE:  Evaluation on 12/29/2023: rounded shoulders, forward head, flexed trunk , and weight shift right  LOWER EXTREMITY ROM:  ROM P:passive  A:active Left Eval 12/29/23  Hip flexion   Hip extension   Hip abduction   Hip adduction   Hip internal rotation   Hip external rotation   Knee flexion   Knee extension 0*  Ankle dorsiflexion   Ankle plantarflexion   Ankle inversion   Ankle eversion    (Blank rows = not tested)  LOWER EXTREMITY MMT:  MMT Right Eval 12/29/23 Left Eval 12/2723  Hip flexion    Hip extension 3/5   Hip abduction 3/5   Hip adduction    Hip internal rotation    Hip external rotation     Knee flexion 3/5   Knee extension 3/5 4/5  Ankle dorsiflexion  2-/5  Ankle plantarflexion    Ankle inversion    Ankle eversion    At Evaluation all strength testing is grossly seated and functionally standing / gait. (Blank rows = not tested)  TRANSFERS: Evaluation on 12/29/2023: Sit to stand: SBA from 22 w/c requires use of armrests and back of legs against chair &/or RW for stabilization. Stand to sit: SBA RW to 22 w/c requires use of armrests and back of legs against chair &/or RW for stabilization.  FUNCTIONAL TESTs:  01/28/2024:  with locked rollator walker support & PT supervision: pt able to reach 7 anteriorly, pick up item from floor and look over his shoulders.  Evaluation on 12/29/2023: Lars Balance 12/56  [facility] PT Assessment - 12/29/23 0900       Standardized Balance Assessment  Standardized Balance Assessment Berg Balance Test      Berg Balance Test   Sit to Stand Needs minimal aid to stand or to stabilize    Standing Unsupported Needs several tries to stand 30 seconds unsupported    Sitting with Back Unsupported but Feet Supported on Floor or Stool Able to sit safely and securely 2 minutes    Stand to Sit Controls descent by using hands    Transfers Able to transfer safely, definite need of hands    Standing Unsupported with Eyes Closed Needs help to keep from falling    Standing Unsupported with Feet Together Needs help to attain position and unable to hold for 15 seconds    From Standing, Reach Forward with Outstretched Arm Loses balance while trying/requires external support    From Standing Position, Pick up Object from Floor Unable to try/needs assist to keep balance    From Standing Position, Turn to Look Behind Over each Shoulder Needs assist to keep from losing balance and falling    Turn 360 Degrees Needs assistance while turning    Standing Unsupported, Alternately Place Feet on Step/Stool Needs assistance to keep from falling or unable to try     Standing Unsupported, One Foot in [employer] balance while stepping or standing    Standing on One Leg Unable to try or needs assist to prevent fall    Total Score 12    Berg comment: < 36 high risk for falls (close to 100%) 46-51 moderate (>50%)   37-45 significant (>80%) 52-55 lower (> 25%)          GAIT: 01/28/2024: Pt amb 150' with rollator walker with supervision.  PT neg ramp & curb with rollator walker with min/CGA.  Evaluation on 12/29/2023: Distance walked: 60' Assistive device utilized: [employer] - 2 wheeled and TTA prosthesis Level of assistance: Min A (multiple balance losses requiring PT assist to stabilize especially in turns) Gait pattern: step through pattern, decreased step length- Right, decreased stance time- Left, decreased hip/knee flexion- Left, decreased ankle dorsiflexion- Right, Right hip hike, Left hip hike, genu recurvatum- Right, antalgic, trendelenburg, lateral hip instability, trunk flexed, and poor foot clearance- Right, left knee instability / flexion moment intermittently  Gait velocity:  0.97 ft/sec Comments: excess UE weight bearing on RW.    CURRENT PROSTHETIC WEAR ASSESSMENT: 02/23/2024: Pt tolerates wearing prosthesis and AFO most of awake hours (up to 16 hours) without skin or limb pain issues.   Evaluation on 12/29/2023:  Patient is dependent with: skin check, residual limb care, care of non-amputated limb, prosthetic cleaning, ply sock cleaning, correct ply sock adjustment, proper wear schedule/adjustment, and proper weight-bearing schedule/adjustment Donning prosthesis: SBA Doffing prosthesis: SBA Prosthetic wear tolerance: up to 1.5 hours, 2x/day, 2 of 3 days since delivery Prosthetic weight bearing tolerance: 5 minutes with no c/o residual limb pain Edema: pitting Residual limb condition: cylindrical shape, 3 cm superficial scab at anterior-lateral limb with no signs of infection, small wound on lateral limb proximal to fibular head with white  covering 0.4 cm, invaginated scar lateral aspect, dry flaking skin, no hair growth, redness over patella (friction from liner when seated), normal temperature.  Prosthetic description: silicon (9 mm anterior portion) with pin lock suspension, total contact socket with flexible inner socket.    TODAY'S TREATMENT:  DATE:  02/23/2024: Prosthetic Care with left transtiial prosthesis and right blue rocker AFO  Patient ambulated 80' x 3 with cane stand-alone tip and handhold assist /minA.  PT cueing on upright posture, sequencing and balance reactions.  Pt neg stairs with 2 rails step-to pattern alternating lead LE for 3 steps ea LE with CGA.  Attempted descending with single rail & cane but pt's LE still too weak to control motion with reduced UE support.  PT reviewed adjusting ply socks with possibility of shower at night to limit limb swelling in morning prior to need to don prosthesis.  PT recommended donning prosthesis upon arising if he is not immediately getting into shower.  Pt verbalized understanding.   TherAct: Leg press BLEs 131# 15 reps; marching for stance LE control 125# 15 reps;  RLE 68# 4 reps (max reps at higher weight) then 62# 10 reps; LLE 56# 5 reps  (max reps at higher weight), then 50# 10 reps Standing red Theraband kicks with BUE support of cane & chair back 10 reps with abd, ext, add & straight leg flex with ea LE with PT min/CGA.    Neuromuscular Re-education:.  Standing bilateral stance with PT minA / tactile cues for support:  head turns right/left, up/down & diagonals.  And static stance eyes closed 10 sec 3 reps with minA / tactile cues for balance. Standing crossways on foam beam 15 sec 3 reps with minA  / tactile cues for balance.    TREATMENT:                                                                                                                              DATE:  02/18/2024: Prosthetic Care with left transtiial prosthesis and right blue rocker AFO  Patient ambulated 80' x 3 with cane stand-alone tip and handhold assist /minA.  PT cueing on upright posture, sequencing and balance reactions. PT adjusted patient's cane.    TherAct: Leg press BLEs 125# 15 reps 2 sets; RLE 62# 10 reps 2 sets; LLE 50# 10 reps 2 sets Standing red Theraband kicks with BUE support of cane & chair back 10 reps with abd, ext, add & straight leg flex with ea LE with PT min/CGA.    Neuromuscular Re-education: Side stepping right & left and backwards gait in //bars - 1st lap of each with //bar & cane, then 3 laps with cane & Min to modA for balance.  Standing bilateral stance with cane RUE support:  head turns right/left, up/down & diagonals with minA / tactile cues for balance.  And static stance eyes closed 10 sec 3 reps with minA / tactile cues for balance.   TREATMENT:  DATE:  02/16/2024: Prosthetic Care with left transtiial prosthesis and right blue rocker AFO  Patient ambulated 80' x 4 with cane stand-alone tip and handhold assist /min to mod A.  PT cueing on upright posture, sequencing and balance reactions. PT educated patient on difference in cane designs as he plans to purchase one.  Patient verbalized understanding.  TherAct: Leg press BLEs 125# 15 reps 2 sets; RLE 62# 10 reps 2 sets except last 4 reps had reduced weight to 50#; LLE 50# 10 reps 2 sets  Neuromuscular Re-education: 4 square stepping CW and CCW with cane with mod to maxA for balance issues. Standing UE resistance blue Thera-Band- single UE 10 reps ea UE with contralateral UE support on locked rollator, 2nd set single UE without UE support with PT minA/tactile cues, then 10 reps BUEs  without UE support with PT minA/tactile cues.    TREATMENT:                                                                                                                              DATE:  02/11/2024: Prosthetic Care with left transtiial prosthesis and right blue rocker AFO  Pt amb 200' with rollator walker with CGA due to visual changes impairing his balance.  Side stepping with PT cues on technique for hip muscle activity to facilitate loading limb without lateral trunk lean.  Progressed BUE support to unilateral finger tip support.   Progressed to turning using above technique.   Backwards gait with rollator walker with PT demo & verbal cues on technique including weight shift over stance LE prior to stepping contralateral LE.  Pt performed 20' X 3 with min/CGA.   Ambulating with rollator walker with eyes closed 20' X 3 working on ambulating in dark with min/CGA.   TherAct: Sit to / from stand 10 reps 2 sets with 24 bar stool with UE assist with PT minA.   Neuromuscular Re-education: Stepping on & off Airex alternating lead LE 5 reps with BUE support and with single UE 5 reps RUE & 5 reps LUE with min/CGA.   SLS on Airex stepping onto Airex and contralateral LE stepping to 8 step anteriorly then back to floor with BUE support and PT CGA for safety. 5 reps 2 sets with ea LE.   Side stepping with PT cues for hip muscle engagement and weight shift. Progressed from BUE support to single UE support.  CGA for safety.   Alternating standing hip motions forward kick, abduction and ext - BUE support 5 reps, LUE support 5 reps & RUE support 5 reps.  CGA for safety.    HOME EXERCISE PROGRAM: Do each exercise 1-2  times per day Do each exercise 5-10 repetitions Hold each exercise for 2 seconds to feel your location  AT SINK FIND YOUR MIDLINE POSITION AND PLACE FEET EQUAL DISTANCE FROM THE MIDLINE.  Try to find this position when standing still for activities.   USE TAPE ON  FLOOR TO MARK THE MIDLINE POSITION which is even with middle of sink.  You also should try to feel  with your limb pressure in socket.  You are trying to feel with limb what you used to feel with the bottom of your foot.  Side to Side Shift: Moving your hips only (not shoulders): move weight onto your left leg, HOLD/FEEL pressure in socket.  Move back to equal weight on each leg, HOLD/FEEL pressure in socket. Move weight onto your right leg, HOLD/FEEL pressure in socket. Move back to equal weight on each leg, HOLD/FEEL pressure in socket. Repeat.  Start with both hands on sink, progress to hand on prosthetic side only Front to Back Shift: Moving your hips only (not shoulders): move your weight forward onto your toes, HOLD/FEEL pressure in socket. Move your weight back to equal Flat Foot on both legs, HOLD/FEEL  pressure in socket. Move your weight back onto your heels, HOLD/FEEL  pressure in socket. Move your weight back to equal on both legs, HOLD/FEEL  pressure in socket. Repeat.  Start with both hands on sink, progress to hand on prosthetic side only Moving Cones / Cups: With equal weight on each leg: Hold on with one hand the first time, then progress to no hand supports. Move cups from one side of sink to the other. Place cups ~2 out of your reach, progress to 10 beyond reach.  Place one hand in middle of sink and reach with other hand. Do both arms.  Overhead/Upward Reaching: alternated reaching up to top cabinets or ceiling if no cabinets present. Keep equal weight on each leg. Start with one hand support on counter while other hand reaches and progress to no hand support with reaching.  ace one hand in middle of sink and reach with other hand. Do both arms.  5.   Looking Over Shoulders: With equal weight on each leg: alternate turning to look over your shoulders with one hand support on counter as needed.  Start with head motions only to look in front of shoulder, then even with shoulder and progress to looking behind you. To look to side, move head /eyes, then shoulder on side looking pulls back,  shift more weight to side looking and pull hip back. Place one hand in middle of sink and let go with other hand so your shoulder can pull back. Switch hands to look other way.   6.  Stepping into lower cabinet:  Move items under cabinet out of your way. Shift your hips/pelvis so weight on prosthesis. Tighten muscles in hip on prosthetic side.  SLOWLY step other leg so front of foot is in cabinet. Then step back to floor. Repeat with other leg.    ASSESSMENT:  CLINICAL IMPRESSION: Patient is improving household type gait with cane but still has high fall risk.  His LE strength is slowly improving with PT activities.   Patient will continue to benefit from skilled PT.  OBJECTIVE IMPAIRMENTS: Abnormal gait, decreased activity tolerance, decreased balance, decreased endurance, decreased knowledge of condition, decreased knowledge of use of DME, decreased mobility, difficulty walking, decreased strength, increased edema, postural dysfunction, prosthetic dependency , and impaired skin integrity.   ACTIVITY LIMITATIONS: carrying, lifting, bending, sitting, standing, stairs, transfers, reach over head, and locomotion level  PARTICIPATION LIMITATIONS: meal prep, driving, shopping, community activity, and yard work  PERSONAL FACTORS: Fitness, Time since onset of injury/illness/exacerbation, and 3+ comorbidities: see PMH are also affecting patient's functional outcome.   REHAB POTENTIAL: Good  CLINICAL DECISION MAKING: Evolving/moderate complexity  EVALUATION COMPLEXITY: Moderate   GOALS: Goals reviewed with patient? Yes  SHORT TERM GOALS: Target date: 01/28/2024  Patient donnes prosthesis modified independent & verbalizes proper cleaning. Baseline: SEE OBJECTIVE DATA Goal status:   MET   01/28/2024 2.  Patient tolerates prosthesis >10 hrs total /day without skin issues or limb pain >5/10 after standing. Baseline: SEE OBJECTIVE DATA Goal status: MET 01/26/2024  3.  Patient able to reach 7,  pick up object from floor and look over both shoulders with RW support with supervision. Baseline: SEE OBJECTIVE DATA Goal status: MET  01/28/2024  4. Patient ambulates 64' with RW & prosthesis with supervision. Baseline: SEE OBJECTIVE DATA Goal status: MET   01/28/2024  5. Patient negotiates ramps & curbs with RW & prosthesis with minA. Baseline: SEE OBJECTIVE DATA Goal status:  MET  01/19/2024  Aydt TERM GOALS: Target date: 03/25/2024  Patient demonstrates & verbalized understanding of prosthetic care to enable safe utilization of prosthesis. Baseline: SEE OBJECTIVE DATA Goal status: Ongoing  02/23/2024  Patient tolerates prosthesis wear >90% of awake hours without skin or limb pain issues. Baseline: SEE OBJECTIVE DATA Goal status: Ongoing     02/23/2024  Berg Balance >30/56 to indicate lower fall risk Baseline: SEE OBJECTIVE DATA Goal status: Ongoing    02/23/2024  Patient ambulates >300' with prosthesis & LRAD independently Baseline: SEE OBJECTIVE DATA Goal status: Ongoing    02/23/2024  Patient negotiates ramps, curbs & stairs with single rail with prosthesis & LRAD independently. Baseline: SEE OBJECTIVE DATA Goal status: Ongoing   02/23/2024  Patient reports Patient-Specific Activity Score average to >/= 5 to indicate improvement in functional activities.   Baseline: SEE OBJECTIVE DATA Goal status: Ongoing   02/23/2024  PLAN:  PT FREQUENCY: 2x/week  PT DURATION: 12 weeks  PLANNED INTERVENTIONS: 97164- PT Re-evaluation, 97750- Physical Performance Testing, 97110-Therapeutic exercises, 97530- Therapeutic activity, 97112- Neuromuscular re-education, [phone]- Self Care, [id number]- Gait training, [phone]- Prosthetic Initial , [phone]- Orthotic/Prosthetic subsequent, Patient/Family education, Balance training, Stair training, Scar mobilization, Vestibular training, and DME instructions  PLAN FOR NEXT SESSION:   continue with leg press,  Continue prosthetic gait with cane stand  alone tip for household, prosthesis and AFO.  Standing balance activities.      Grayce Spatz, PT, DPT 02/23/2024, 2:23 PM  "

## 2024-02-25 ENCOUNTER — Encounter: Payer: Self-pay | Admitting: Physical Therapy

## 2024-02-25 ENCOUNTER — Ambulatory Visit: Admitting: Physical Therapy

## 2024-02-25 DIAGNOSIS — R2689 Other abnormalities of gait and mobility: Secondary | ICD-10-CM | POA: Diagnosis not present

## 2024-02-25 DIAGNOSIS — M6281 Muscle weakness (generalized): Secondary | ICD-10-CM

## 2024-02-25 DIAGNOSIS — M21371 Foot drop, right foot: Secondary | ICD-10-CM

## 2024-02-25 DIAGNOSIS — L98498 Non-pressure chronic ulcer of skin of other sites with other specified severity: Secondary | ICD-10-CM | POA: Diagnosis not present

## 2024-02-25 DIAGNOSIS — R2681 Unsteadiness on feet: Secondary | ICD-10-CM

## 2024-02-25 NOTE — Therapy (Signed)
 "  OUTPATIENT PHYSICAL THERAPY PROSTHETIC TREATMENT   Patient Name: Scott Navarro. MRN: [id number] DOB:05/23/1965, 58 y.o., male Today's Date: 02/25/2024  END OF SESSION:  PT End of Session - 02/25/24 1059     Visit Number 17    Number of Visits 25    Date for Recertification  03/25/24    Authorization Type [employer] other    Authorization - Number of Visits 90    PT Start Time 1058    PT Stop Time 1140    PT Time Calculation (min) 42 min    Equipment Utilized During Treatment Gait belt    Activity Tolerance Patient tolerated treatment well    Behavior During Therapy WFL for tasks assessed/performed             Past Medical History:  Diagnosis Date   Type II diabetes mellitus (HCC) dx'd 10/08/2016   Past Surgical History:  Procedure Laterality Date   AMPUTATION Right 10/09/2016   Procedure: INCISION AND DRAINAGE RIGHT GREAT TOE;  Surgeon: Harden Jerona GAILS, MD;  Location: [facility] OR;  Service: Orthopedics;  Laterality: Right;   AMPUTATION Left 07/23/2023   Procedure: AMPUTATION, BELOW KNEE, LEFT;  Surgeon: Harden Jerona GAILS, MD;  Location: [facility] OR;  Service: Orthopedics;  Laterality: Left;  IRRIGATION AND DEBRIDEMENT AND AMPUTATION OF LEFT LEG   IRRIGATION AND DEBRIDEMENT KNEE Left 07/25/2023   Procedure: IRRIGATION AND DEBRIDEMENT LEFT KNEE;  Surgeon: Harden Jerona GAILS, MD;  Location: [facility] OR;  Service: Orthopedics;  Laterality: Left;  DEBRIDEMENT LEFT LEG   Patient Active Problem List   Diagnosis Date Noted   Coping style affecting medical condition 08/05/2023   S/P BKA (below knee amputation), left (HCC) 07/30/2023   ARF (acute renal failure) 07/23/2023   Hyperlipidemia 07/23/2023   Necrotizing fasciitis (HCC) 07/22/2023   Anemia 07/22/2023   Diabetic retinopathy associated with type 2 diabetes mellitus (HCC) 02/18/2023   Charcot joint of left foot 02/18/2023   Encounter for lipid screening for cardiovascular disease 02/18/2023   Screening for prostate cancer 02/18/2023   Type 2  diabetes mellitus with hyperglycemia (HCC) 11/28/2022   Gait instability 11/28/2022   Retinal hemorrhage 11/21/2022   Glaucoma 11/21/2022   Achilles tendon contracture, right 11/21/2016   Class 1 obesity due to excess calories with body mass index (BMI) of 33.0 to 33.9 in adult    Diabetic polyneuropathy associated with type 2 diabetes mellitus (HCC)     PCP: Bulah Alm RAMAN, PA-C  REFERRING PROVIDER: Maurilio CHRISTELLA Collet, PA-C  ONSET DATE:  12/26/2023 prosthesis delivery  REFERRING DIAG: S10.487 (ICD-10-CM) - S/P BKA (below knee amputation), left   THERAPY DIAG:  Other abnormalities of gait and mobility  Unsteadiness on feet  Muscle weakness (generalized)  Foot drop, right  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    PERTINENT HISTORY: left TTA 07/23/23, blind in right eye, DM2, polyneuropathy, Acute renal failure, HLD  PAIN:  Are you having pain? No  PRECAUTIONS: Fall  RED FLAGS: None   WEIGHT BEARING RESTRICTIONS: No  FALLS: Has patient fallen in last 6 months? No  LIVING ENVIRONMENT: Lives with: lives with spouse and mother with Alzheimer's. He is currently living with son who had recent medical issue making him bed / wheelchair bound.  His wife & he are son's caregivers but think is not permanent  situation.   Lives in: House his house is 2 story development worker, community / bedroom main level) and son's house is single level (where they are currently staying).  Home Access: 3 steps to main entrance to his home and son's house has ramp.  Stairs: Yes: Internal: 14 steps; on right going up and External: 3 steps; on left going up Has following equipment at home: Single point cane, Walker - 2 wheeled, Wheelchair (manual), Shower bench, and bed side commode  PLOF: Independent, Independent with household  mobility without device, and Independent with community mobility without device  Occupation: retired  PATIENT GOALS: to use prosthesis normal activities: driving, shopping, yard work,  active at place in [employer],   OBJECTIVE:  Patient-Specific Activity Scoring Scheme  0 represents unable to perform. 10 represents able to perform at prior level. 0 1 2 3 4 5 6 7 8 9  10 (Date and Score)   Activity Eval 12/29/23 02/18/24   1. Care & wear of prosthesis  1 7   2. Standing ADLs   2 5   3. Walking  2 5  4. Outdoor activities / curb / ramps/ stairs / uneven 0 2  5.    Score 1.25 4.75   Total score = sum of the activity scores/number of activities Minimum detectable change (90%CI) for average score = 2 points Minimum detectable change (90%CI) for single activity score = 3 points  COGNITION: Evaluation on 12/29/2023: Overall cognitive status: Within functional limits for tasks assessed   SENSATION: Evaluation on 12/29/2023: [facility]  CARDIOVASCULAR RESPONSE: Evaluation on 12/29/2023: Functional activity: standing balance & gait Post-activity vitals: HR: 116 SpO2: 100% Modified Borg scale for dyspnea: 2: mild shortness of breath  POSTURE:  Evaluation on 12/29/2023: rounded shoulders, forward head, flexed trunk , and weight shift right  LOWER EXTREMITY ROM:  ROM P:passive  A:active Left Eval 12/29/23  Hip flexion   Hip extension   Hip abduction   Hip adduction   Hip internal rotation   Hip external rotation   Knee flexion   Knee extension 0*  Ankle dorsiflexion   Ankle plantarflexion   Ankle inversion   Ankle eversion    (Blank rows = not tested)  LOWER EXTREMITY MMT:  MMT Right Eval 12/29/23 Left Eval 12/2723  Hip flexion    Hip extension 3/5   Hip abduction 3/5   Hip adduction    Hip internal rotation    Hip external rotation    Knee flexion 3/5   Knee extension 3/5 4/5  Ankle dorsiflexion  2-/5  Ankle plantarflexion    Ankle  inversion    Ankle eversion    At Evaluation all strength testing is grossly seated and functionally standing / gait. (Blank rows = not tested)  TRANSFERS: Evaluation on 12/29/2023: Sit to stand: SBA from 22 w/c requires use of armrests and back of legs against chair &/or RW for stabilization. Stand to sit: SBA RW to 22 w/c requires use of armrests and back of legs against chair &/or RW for stabilization.  FUNCTIONAL TESTs:  01/28/2024:  with locked rollator walker support & PT supervision: pt able to reach 7 anteriorly, pick up item from floor and look over his shoulders.  Evaluation on 12/29/2023: Lars Balance 12/56  [facility] PT Assessment - 12/29/23 0900       Standardized Balance Assessment   Standardized Balance Assessment Berg Balance Test      Berg Balance Test   Sit to Stand Needs minimal aid to  stand or to stabilize    Standing Unsupported Needs several tries to stand 30 seconds unsupported    Sitting with Back Unsupported but Feet Supported on Floor or Stool Able to sit safely and securely 2 minutes    Stand to Sit Controls descent by using hands    Transfers Able to transfer safely, definite need of hands    Standing Unsupported with Eyes Closed Needs help to keep from falling    Standing Unsupported with Feet Together Needs help to attain position and unable to hold for 15 seconds    From Standing, Reach Forward with Outstretched Arm Loses balance while trying/requires external support    From Standing Position, Pick up Object from Floor Unable to try/needs assist to keep balance    From Standing Position, Turn to Look Behind Over each Shoulder Needs assist to keep from losing balance and falling    Turn 360 Degrees Needs assistance while turning    Standing Unsupported, Alternately Place Feet on Step/Stool Needs assistance to keep from falling or unable to try    Standing Unsupported, One Foot in [employer] balance while stepping or standing    Standing on One Leg  Unable to try or needs assist to prevent fall    Total Score 12    Berg comment: < 36 high risk for falls (close to 100%) 46-51 moderate (>50%)   37-45 significant (>80%) 52-55 lower (> 25%)          GAIT: 01/28/2024: Pt amb 150' with rollator walker with supervision.  PT neg ramp & curb with rollator walker with min/CGA.  Evaluation on 12/29/2023: Distance walked: 60' Assistive device utilized: [employer] - 2 wheeled and TTA prosthesis Level of assistance: Min A (multiple balance losses requiring PT assist to stabilize especially in turns) Gait pattern: step through pattern, decreased step length- Right, decreased stance time- Left, decreased hip/knee flexion- Left, decreased ankle dorsiflexion- Right, Right hip hike, Left hip hike, genu recurvatum- Right, antalgic, trendelenburg, lateral hip instability, trunk flexed, and poor foot clearance- Right, left knee instability / flexion moment intermittently  Gait velocity:  0.97 ft/sec Comments: excess UE weight bearing on RW.    CURRENT PROSTHETIC WEAR ASSESSMENT: 02/23/2024: Pt tolerates wearing prosthesis and AFO most of awake hours (up to 16 hours) without skin or limb pain issues.   Evaluation on 12/29/2023:  Patient is dependent with: skin check, residual limb care, care of non-amputated limb, prosthetic cleaning, ply sock cleaning, correct ply sock adjustment, proper wear schedule/adjustment, and proper weight-bearing schedule/adjustment Donning prosthesis: SBA Doffing prosthesis: SBA Prosthetic wear tolerance: up to 1.5 hours, 2x/day, 2 of 3 days since delivery Prosthetic weight bearing tolerance: 5 minutes with no c/o residual limb pain Edema: pitting Residual limb condition: cylindrical shape, 3 cm superficial scab at anterior-lateral limb with no signs of infection, small wound on lateral limb proximal to fibular head with white covering 0.4 cm, invaginated scar lateral aspect, dry flaking skin, no hair growth, redness over patella  (friction from liner when seated), normal temperature.  Prosthetic description: silicon (9 mm anterior portion) with pin lock suspension, total contact socket with flexible inner socket.    TODAY'S TREATMENT:  DATE:  02/25/2024: Prosthetic Care with left transtiial prosthesis and right blue rocker AFO  Patient ambulated 80' x 2 and 110' x 1 with cane stand-alone tip and handhold assist /minA.  PT cueing on upright posture, sequencing and balance reactions. Pt neg ramp & curb with cane & modA/HHA 2 reps ea with PT demo & verbal cues on technique.   TherAct: Leg press BLEs 131# 15 reps 2 sets; marching for stance LE control 125# 15 reps 2 sets;  RLE 68# 6 reps (max reps at higher weight) then 62# 10 reps; LLE 56# 10 reps 2 sets.  Standing green Theraband kicks with BUE support of cane & chair back 10 reps with abd, ext, add & straight leg flex with ea LE with PT min/CGA.    Neuromuscular Re-education:.  Pt amb against resistance (green theraband LUE) with cane RUE - walk out, hold against resistance 5 sec, then walk back under control.  5 reps with resistance anteriorly and 5 reps resistance posteriorly. ModA for balance / stabilization.    TREATMENT:                                                                                                                             DATE:  02/23/2024: Prosthetic Care with left transtiial prosthesis and right blue rocker AFO  Patient ambulated 80' x 3 with cane stand-alone tip and handhold assist /minA.  PT cueing on upright posture, sequencing and balance reactions.  Pt neg stairs with 2 rails step-to pattern alternating lead LE for 3 steps ea LE with CGA.  Attempted descending with single rail & cane but pt's LE still too weak to control motion with reduced UE support.  PT reviewed adjusting ply socks with possibility of shower at  night to limit limb swelling in morning prior to need to don prosthesis.  PT recommended donning prosthesis upon arising if he is not immediately getting into shower.  Pt verbalized understanding.   TherAct: Leg press BLEs 131# 15 reps; marching for stance LE control 125# 15 reps;  RLE 68# 4 reps (max reps at higher weight) then 62# 10 reps; LLE 56# 5 reps  (max reps at higher weight), then 50# 10 reps Standing red Theraband kicks with BUE support of cane & chair back 10 reps with abd, ext, add & straight leg flex with ea LE with PT min/CGA.    Neuromuscular Re-education:.  Standing bilateral stance with PT minA / tactile cues for support:  head turns right/left, up/down & diagonals.  And static stance eyes closed 10 sec 3 reps with minA / tactile cues for balance. Standing crossways on foam beam 15 sec 3 reps with minA  / tactile cues for balance.    TREATMENT:  DATE:  02/18/2024: Prosthetic Care with left transtiial prosthesis and right blue rocker AFO  Patient ambulated 80' x 3 with cane stand-alone tip and handhold assist /minA.  PT cueing on upright posture, sequencing and balance reactions. PT adjusted patient's cane.    TherAct: Leg press BLEs 125# 15 reps 2 sets; RLE 62# 10 reps 2 sets; LLE 50# 10 reps 2 sets Standing red Theraband kicks with BUE support of cane & chair back 10 reps with abd, ext, add & straight leg flex with ea LE with PT min/CGA.    Neuromuscular Re-education: Side stepping right & left and backwards gait in //bars - 1st lap of each with //bar & cane, then 3 laps with cane & Min to modA for balance.  Standing bilateral stance with cane RUE support:  head turns right/left, up/down & diagonals with minA / tactile cues for balance.  And static stance eyes closed 10 sec 3 reps with minA / tactile cues for balance.   TREATMENT:                                                                                                                              DATE:  02/16/2024: Prosthetic Care with left transtiial prosthesis and right blue rocker AFO  Patient ambulated 80' x 4 with cane stand-alone tip and handhold assist /min to mod A.  PT cueing on upright posture, sequencing and balance reactions. PT educated patient on difference in cane designs as he plans to purchase one.  Patient verbalized understanding.  TherAct: Leg press BLEs 125# 15 reps 2 sets; RLE 62# 10 reps 2 sets except last 4 reps had reduced weight to 50#; LLE 50# 10 reps 2 sets  Neuromuscular Re-education: 4 square stepping CW and CCW with cane with mod to maxA for balance issues. Standing UE resistance blue Thera-Band- single UE 10 reps ea UE with contralateral UE support on locked rollator, 2nd set single UE without UE support with PT minA/tactile cues, then 10 reps BUEs  without UE support with PT minA/tactile cues.    HOME EXERCISE PROGRAM: Do each exercise 1-2  times per day Do each exercise 5-10 repetitions Hold each exercise for 2 seconds to feel your location  AT SINK FIND YOUR MIDLINE POSITION AND PLACE FEET EQUAL DISTANCE FROM THE MIDLINE.  Try to find this position when standing still for activities.   USE TAPE ON FLOOR TO MARK THE MIDLINE POSITION which is even with middle of sink.  You also should try to feel with your limb pressure in socket.  You are trying to feel with limb what you used to feel with the bottom of your foot.  Side to Side Shift: Moving your hips only (not shoulders): move weight onto your left leg, HOLD/FEEL pressure in socket.  Move back to equal weight on each leg, HOLD/FEEL pressure in socket. Move weight onto your right leg, HOLD/FEEL pressure in socket. Move back to equal weight on  each leg, HOLD/FEEL pressure in socket. Repeat.  Start with both hands on sink, progress to hand on prosthetic side only Front to Back Shift: Moving your hips  only (not shoulders): move your weight forward onto your toes, HOLD/FEEL pressure in socket. Move your weight back to equal Flat Foot on both legs, HOLD/FEEL  pressure in socket. Move your weight back onto your heels, HOLD/FEEL  pressure in socket. Move your weight back to equal on both legs, HOLD/FEEL  pressure in socket. Repeat.  Start with both hands on sink, progress to hand on prosthetic side only Moving Cones / Cups: With equal weight on each leg: Hold on with one hand the first time, then progress to no hand supports. Move cups from one side of sink to the other. Place cups ~2 out of your reach, progress to 10 beyond reach.  Place one hand in middle of sink and reach with other hand. Do both arms.  Overhead/Upward Reaching: alternated reaching up to top cabinets or ceiling if no cabinets present. Keep equal weight on each leg. Start with one hand support on counter while other hand reaches and progress to no hand support with reaching.  ace one hand in middle of sink and reach with other hand. Do both arms.  5.   Looking Over Shoulders: With equal weight on each leg: alternate turning to look over your shoulders with one hand support on counter as needed.  Start with head motions only to look in front of shoulder, then even with shoulder and progress to looking behind you. To look to side, move head /eyes, then shoulder on side looking pulls back, shift more weight to side looking and pull hip back. Place one hand in middle of sink and let go with other hand so your shoulder can pull back. Switch hands to look other way.   6.  Stepping into lower cabinet:  Move items under cabinet out of your way. Shift your hips/pelvis so weight on prosthesis. Tighten muscles in hip on prosthetic side.  SLOWLY step other leg so front of foot is in cabinet. Then step back to floor. Repeat with other leg.    ASSESSMENT:  CLINICAL IMPRESSION: Patient is improving household type gait with cane but still has high fall  risk.  His LE strength is slowly improving with PT activities.   Patient will continue to benefit from skilled PT.  OBJECTIVE IMPAIRMENTS: Abnormal gait, decreased activity tolerance, decreased balance, decreased endurance, decreased knowledge of condition, decreased knowledge of use of DME, decreased mobility, difficulty walking, decreased strength, increased edema, postural dysfunction, prosthetic dependency , and impaired skin integrity.   ACTIVITY LIMITATIONS: carrying, lifting, bending, sitting, standing, stairs, transfers, reach over head, and locomotion level  PARTICIPATION LIMITATIONS: meal prep, driving, shopping, community activity, and yard work  PERSONAL FACTORS: Fitness, Time since onset of injury/illness/exacerbation, and 3+ comorbidities: see PMH are also affecting patient's functional outcome.   REHAB POTENTIAL: Good  CLINICAL DECISION MAKING: Evolving/moderate complexity  EVALUATION COMPLEXITY: Moderate   GOALS: Goals reviewed with patient? Yes  SHORT TERM GOALS: Target date: 01/28/2024  Patient donnes prosthesis modified independent & verbalizes proper cleaning. Baseline: SEE OBJECTIVE DATA Goal status:   MET   01/28/2024 2.  Patient tolerates prosthesis >10 hrs total /day without skin issues or limb pain >5/10 after standing. Baseline: SEE OBJECTIVE DATA Goal status: MET 01/26/2024  3.  Patient able to reach 7, pick up object from floor and look over both shoulders with RW support with supervision.  Baseline: SEE OBJECTIVE DATA Goal status: MET  01/28/2024  4. Patient ambulates 71' with RW & prosthesis with supervision. Baseline: SEE OBJECTIVE DATA Goal status: MET   01/28/2024  5. Patient negotiates ramps & curbs with RW & prosthesis with minA. Baseline: SEE OBJECTIVE DATA Goal status:  MET  01/19/2024  [place] TERM GOALS: Target date: 03/25/2024  Patient demonstrates & verbalized understanding of prosthetic care to enable safe utilization of  prosthesis. Baseline: SEE OBJECTIVE DATA Goal status: Ongoing  02/23/2024  Patient tolerates prosthesis wear >90% of awake hours without skin or limb pain issues. Baseline: SEE OBJECTIVE DATA Goal status: Ongoing     02/23/2024  Berg Balance >30/56 to indicate lower fall risk Baseline: SEE OBJECTIVE DATA Goal status: Ongoing    02/23/2024  Patient ambulates >300' with prosthesis & LRAD independently Baseline: SEE OBJECTIVE DATA Goal status: Ongoing    02/23/2024  Patient negotiates ramps, curbs & stairs with single rail with prosthesis & LRAD independently. Baseline: SEE OBJECTIVE DATA Goal status: Ongoing   02/23/2024  Patient reports Patient-Specific Activity Score average to >/= 5 to indicate improvement in functional activities.   Baseline: SEE OBJECTIVE DATA Goal status: Ongoing   02/23/2024  PLAN:  PT FREQUENCY: 2x/week  PT DURATION: 12 weeks  PLANNED INTERVENTIONS: 97164- PT Re-evaluation, 97750- Physical Performance Testing, 97110-Therapeutic exercises, 97530- Therapeutic activity, 97112- Neuromuscular re-education, [id number]- Self Care, [id number]- Gait training, [phone]- Prosthetic Initial , [phone]- Orthotic/Prosthetic subsequent, Patient/Family education, Balance training, Stair training, Scar mobilization, Vestibular training, and DME instructions  PLAN FOR NEXT SESSION:  continue with leg press,  Continue prosthetic gait with cane stand alone tip for household, prosthesis and AFO.  Standing balance activities.      Grayce Spatz, PT, DPT 02/25/2024, 12:07 PM  "

## 2024-03-01 ENCOUNTER — Encounter: Admitting: Physical Therapy

## 2024-03-03 ENCOUNTER — Encounter: Admitting: Physical Therapy

## 2024-03-08 ENCOUNTER — Encounter: Admitting: Physical Therapy

## 2024-03-10 ENCOUNTER — Ambulatory Visit: Admitting: Physical Therapy

## 2024-03-10 ENCOUNTER — Encounter: Payer: Self-pay | Admitting: Physical Therapy

## 2024-03-10 DIAGNOSIS — R2681 Unsteadiness on feet: Secondary | ICD-10-CM

## 2024-03-10 DIAGNOSIS — M21371 Foot drop, right foot: Secondary | ICD-10-CM

## 2024-03-10 DIAGNOSIS — M6281 Muscle weakness (generalized): Secondary | ICD-10-CM | POA: Diagnosis not present

## 2024-03-10 DIAGNOSIS — L98498 Non-pressure chronic ulcer of skin of other sites with other specified severity: Secondary | ICD-10-CM

## 2024-03-10 DIAGNOSIS — R2689 Other abnormalities of gait and mobility: Secondary | ICD-10-CM

## 2024-03-10 NOTE — Therapy (Signed)
 "  OUTPATIENT PHYSICAL THERAPY PROSTHETIC TREATMENT   Patient Name: Scott Navarro. MRN: [id number] DOB:August 24, 1965, 59 y.o., male Today's Date: 03/10/2024  END OF SESSION:  PT End of Session - 03/10/24 1102     Visit Number 18    Number of Visits 25    Date for Recertification  03/25/24    Authorization Type [employer] other    Authorization - Number of Visits 90    PT Start Time 1100    PT Stop Time 1142    PT Time Calculation (min) 42 min    Equipment Utilized During Treatment Gait belt    Activity Tolerance Patient tolerated treatment well    Behavior During Therapy WFL for tasks assessed/performed              Past Medical History:  Diagnosis Date   Type II diabetes mellitus (HCC) dx'd 10/08/2016   Past Surgical History:  Procedure Laterality Date   AMPUTATION Right 10/09/2016   Procedure: INCISION AND DRAINAGE RIGHT GREAT TOE;  Surgeon: Harden Jerona GAILS, MD;  Location: [facility] OR;  Service: Orthopedics;  Laterality: Right;   AMPUTATION Left 07/23/2023   Procedure: AMPUTATION, BELOW KNEE, LEFT;  Surgeon: Harden Jerona GAILS, MD;  Location: [facility] OR;  Service: Orthopedics;  Laterality: Left;  IRRIGATION AND DEBRIDEMENT AND AMPUTATION OF LEFT LEG   IRRIGATION AND DEBRIDEMENT KNEE Left 07/25/2023   Procedure: IRRIGATION AND DEBRIDEMENT LEFT KNEE;  Surgeon: Harden Jerona GAILS, MD;  Location: [facility] OR;  Service: Orthopedics;  Laterality: Left;  DEBRIDEMENT LEFT LEG   Patient Active Problem List   Diagnosis Date Noted   Coping style affecting medical condition 08/05/2023   S/P BKA (below knee amputation), left (HCC) 07/30/2023   ARF (acute renal failure) 07/23/2023   Hyperlipidemia 07/23/2023   Necrotizing fasciitis (HCC) 07/22/2023   Anemia 07/22/2023   Diabetic retinopathy associated with type 2 diabetes mellitus (HCC) 02/18/2023   Charcot joint of left foot 02/18/2023   Encounter for lipid screening for cardiovascular disease 02/18/2023   Screening for prostate cancer 02/18/2023   Type 2  diabetes mellitus with hyperglycemia (HCC) 11/28/2022   Gait instability 11/28/2022   Retinal hemorrhage 11/21/2022   Glaucoma 11/21/2022   Achilles tendon contracture, right 11/21/2016   Class 1 obesity due to excess calories with body mass index (BMI) of 33.0 to 33.9 in adult    Diabetic polyneuropathy associated with type 2 diabetes mellitus (HCC)     PCP: Bulah Alm RAMAN, PA-C  REFERRING PROVIDER: Maurilio CHRISTELLA Collet, PA-C  ONSET DATE:  12/26/2023 prosthesis delivery  REFERRING DIAG: S10.487 (ICD-10-CM) - S/P BKA (below knee amputation), left   THERAPY DIAG:  Other abnormalities of gait and mobility  Unsteadiness on feet  Muscle weakness (generalized)  Foot drop, right  Non-pressure chronic ulcer of skin of other sites with other specified severity [facility]  Rationale for Evaluation and Treatment: Rehabilitation  SUBJECTIVE:   SUBJECTIVE STATEMENT: He had right eye cataract surgery on 03/08/2024 with no lifting or bending over restrictions.  Plan are left cataract surgery on 1/19.  He needs retina eye doctor to address blood vessel issues.  He is wearing prosthesis & AFO most of awake hours.     PERTINENT HISTORY: left TTA 07/23/23, blind in right eye, DM2, polyneuropathy, Acute renal failure, HLD  PAIN:  Are you having pain? No  PRECAUTIONS: Fall  RED FLAGS: None   WEIGHT BEARING RESTRICTIONS: No  FALLS: Has patient fallen in last 6 months? No  LIVING ENVIRONMENT: Lives  with: lives with spouse and mother with Alzheimer's. He is currently living with son who had recent medical issue making him bed / wheelchair bound.  His wife & he are son's caregivers but think is not permanent situation.   Lives in: House his house is 2 story development worker, community / bedroom main level) and son's house is single level (where they are currently staying).  Home Access: 3 steps to main entrance to his home and son's house has ramp.  Stairs: Yes: Internal: 14 steps; on right going up and External:  3 steps; on left going up Has following equipment at home: Single point cane, Walker - 2 wheeled, Wheelchair (manual), Shower bench, and bed side commode  PLOF: Independent, Independent with household mobility without device, and Independent with community mobility without device  Occupation: retired  PATIENT GOALS: to use prosthesis normal activities: driving, shopping, yard work,  active at place in [employer],   OBJECTIVE:  Patient-Specific Activity Scoring Scheme  0 represents unable to perform. 10 represents able to perform at prior level. 0 1 2 3 4 5 6 7 8 9  10 (Date and Score)   Activity Eval 12/29/23 02/18/24   1. Care & wear of prosthesis  1 7   2. Standing ADLs   2 5   3. Walking  2 5  4. Outdoor activities / curb / ramps/ stairs / uneven 0 2  5.    Score 1.25 4.75   Total score = sum of the activity scores/number of activities Minimum detectable change (90%CI) for average score = 2 points Minimum detectable change (90%CI) for single activity score = 3 points  COGNITION: Evaluation on 12/29/2023: Overall cognitive status: Within functional limits for tasks assessed   SENSATION: Evaluation on 12/29/2023: [facility]  CARDIOVASCULAR RESPONSE: Evaluation on 12/29/2023: Functional activity: standing balance & gait Post-activity vitals: HR: 116 SpO2: 100% Modified Borg scale for dyspnea: 2: mild shortness of breath  POSTURE:  Evaluation on 12/29/2023: rounded shoulders, forward head, flexed trunk , and weight shift right  LOWER EXTREMITY ROM:  ROM P:passive  A:active Left Eval 12/29/23  Hip flexion   Hip extension   Hip abduction   Hip adduction   Hip internal rotation   Hip external rotation   Knee flexion   Knee extension 0*  Ankle dorsiflexion   Ankle plantarflexion   Ankle inversion   Ankle eversion    (Blank rows = not tested)  LOWER EXTREMITY MMT:  MMT Right Eval 12/29/23 Left Eval 12/2723  Hip flexion    Hip extension 3/5    Hip abduction 3/5   Hip adduction    Hip internal rotation    Hip external rotation    Knee flexion 3/5   Knee extension 3/5 4/5  Ankle dorsiflexion  2-/5  Ankle plantarflexion    Ankle inversion    Ankle eversion    At Evaluation all strength testing is grossly seated and functionally standing / gait. (Blank rows = not tested)  TRANSFERS: Evaluation on 12/29/2023: Sit to stand: SBA from 22 w/c requires use of armrests and back of legs against chair &/or RW for stabilization. Stand to sit: SBA RW to 22 w/c requires use of armrests and back of legs against chair &/or RW for stabilization.  FUNCTIONAL TESTs:  01/28/2024:  with locked rollator walker support & PT supervision: pt able to reach 7 anteriorly, pick up item from floor and look over his shoulders.  Evaluation on 12/29/2023: Lars Balance 12/56  [facility] PT  Assessment - 12/29/23 0900       Standardized Balance Assessment   Standardized Balance Assessment Berg Balance Test      Berg Balance Test   Sit to Stand Needs minimal aid to stand or to stabilize    Standing Unsupported Needs several tries to stand 30 seconds unsupported    Sitting with Back Unsupported but Feet Supported on Floor or Stool Able to sit safely and securely 2 minutes    Stand to Sit Controls descent by using hands    Transfers Able to transfer safely, definite need of hands    Standing Unsupported with Eyes Closed Needs help to keep from falling    Standing Unsupported with Feet Together Needs help to attain position and unable to hold for 15 seconds    From Standing, Reach Forward with Outstretched Arm Loses balance while trying/requires external support    From Standing Position, Pick up Object from Floor Unable to try/needs assist to keep balance    From Standing Position, Turn to Look Behind Over each Shoulder Needs assist to keep from losing balance and falling    Turn 360 Degrees Needs assistance while turning    Standing Unsupported,  Alternately Place Feet on Step/Stool Needs assistance to keep from falling or unable to try    Standing Unsupported, One Foot in [employer] balance while stepping or standing    Standing on One Leg Unable to try or needs assist to prevent fall    Total Score 12    Berg comment: < 36 high risk for falls (close to 100%) 46-51 moderate (>50%)   37-45 significant (>80%) 52-55 lower (> 25%)          GAIT: 01/28/2024: Pt amb 150' with rollator walker with supervision.  PT neg ramp & curb with rollator walker with min/CGA.  Evaluation on 12/29/2023: Distance walked: 60' Assistive device utilized: [employer] - 2 wheeled and TTA prosthesis Level of assistance: Min A (multiple balance losses requiring PT assist to stabilize especially in turns) Gait pattern: step through pattern, decreased step length- Right, decreased stance time- Left, decreased hip/knee flexion- Left, decreased ankle dorsiflexion- Right, Right hip hike, Left hip hike, genu recurvatum- Right, antalgic, trendelenburg, lateral hip instability, trunk flexed, and poor foot clearance- Right, left knee instability / flexion moment intermittently  Gait velocity:  0.97 ft/sec Comments: excess UE weight bearing on RW.    CURRENT PROSTHETIC WEAR ASSESSMENT: 02/23/2024: Pt tolerates wearing prosthesis and AFO most of awake hours (up to 16 hours) without skin or limb pain issues.   Evaluation on 12/29/2023:  Patient is dependent with: skin check, residual limb care, care of non-amputated limb, prosthetic cleaning, ply sock cleaning, correct ply sock adjustment, proper wear schedule/adjustment, and proper weight-bearing schedule/adjustment Donning prosthesis: SBA Doffing prosthesis: SBA Prosthetic wear tolerance: up to 1.5 hours, 2x/day, 2 of 3 days since delivery Prosthetic weight bearing tolerance: 5 minutes with no c/o residual limb pain Edema: pitting Residual limb condition: cylindrical shape, 3 cm superficial scab at anterior-lateral  limb with no signs of infection, small wound on lateral limb proximal to fibular head with white covering 0.4 cm, invaginated scar lateral aspect, dry flaking skin, no hair growth, redness over patella (friction from liner when seated), normal temperature.  Prosthetic description: silicon (9 mm anterior portion) with pin lock suspension, total contact socket with flexible inner socket.    TODAY'S TREATMENT:  DATE:  03/10/2024: Prosthetic Care with left transtiial prosthesis and right blue rocker AFO  Patient ambulated 80' x 2 and 110' x 1 with cane stand-alone tip with minA.  PT cueing on upright posture, sequencing and balance reactions.  Patient had improved balance with gait on the second walk which was post balance work noted below.  Therapeutic activities PT demo and verbal cues on sit to/from stand technique with focus on weight shift over feet as he lowers and raises body weight.  Patient performed sit to stand from 24 barstool with out upper extremity assist including goal not to touch parallel bars for stabilization 10 reps 2 sets with minA for stabilization and tactile/verbal correctional cues.  Neuromuscular Re-education:.  Standing balance activities with minA for stabilization and tactile/verbal correctional cues.  PT encouraged use of mirror for visual feedback to stabilize between motions each direction.  Patient performed head motions right/midline/left, up/midline/down and diagonals.  Patient performed each direction standing on floor with eyes open followed by eyes closed.  Patient performed each direction standing on foam with eyes open which caused greater balance disturbances.  Patient was able to improve stabilization with repetition and PT cueing.   TREATMENT:                                                                                                                              DATE:  02/25/2024: Prosthetic Care with left transtiial prosthesis and right blue rocker AFO  Patient ambulated 80' x 2 and 110' x 1 with cane stand-alone tip and handhold assist /minA.  PT cueing on upright posture, sequencing and balance reactions. Pt neg ramp & curb with cane & modA/HHA 2 reps ea with PT demo & verbal cues on technique.   TherAct: Leg press BLEs 131# 15 reps 2 sets; marching for stance LE control 125# 15 reps 2 sets;  RLE 68# 6 reps (max reps at higher weight) then 62# 10 reps; LLE 56# 10 reps 2 sets.  Standing green Theraband kicks with BUE support of cane & chair back 10 reps with abd, ext, add & straight leg flex with ea LE with PT min/CGA.    Neuromuscular Re-education:.  Pt amb against resistance (green theraband LUE) with cane RUE - walk out, hold against resistance 5 sec, then walk back under control.  5 reps with resistance anteriorly and 5 reps resistance posteriorly. ModA for balance / stabilization.    TREATMENT:  DATE:  02/23/2024: Prosthetic Care with left transtiial prosthesis and right blue rocker AFO  Patient ambulated 80' x 3 with cane stand-alone tip and handhold assist /minA.  PT cueing on upright posture, sequencing and balance reactions.  Pt neg stairs with 2 rails step-to pattern alternating lead LE for 3 steps ea LE with CGA.  Attempted descending with single rail & cane but pt's LE still too weak to control motion with reduced UE support.  PT reviewed adjusting ply socks with possibility of shower at night to limit limb swelling in morning prior to need to don prosthesis.  PT recommended donning prosthesis upon arising if he is not immediately getting into shower.  Pt verbalized understanding.   TherAct: Leg press BLEs 131# 15 reps; marching for stance LE control 125# 15 reps;  RLE 68# 4 reps (max reps at  higher weight) then 62# 10 reps; LLE 56# 5 reps  (max reps at higher weight), then 50# 10 reps Standing red Theraband kicks with BUE support of cane & chair back 10 reps with abd, ext, add & straight leg flex with ea LE with PT min/CGA.    Neuromuscular Re-education:.  Standing bilateral stance with PT minA / tactile cues for support:  head turns right/left, up/down & diagonals.  And static stance eyes closed 10 sec 3 reps with minA / tactile cues for balance. Standing crossways on foam beam 15 sec 3 reps with minA  / tactile cues for balance.    TREATMENT:                                                                                                                             DATE:  02/18/2024: Prosthetic Care with left transtiial prosthesis and right blue rocker AFO  Patient ambulated 80' x 3 with cane stand-alone tip and handhold assist /minA.  PT cueing on upright posture, sequencing and balance reactions. PT adjusted patient's cane.    TherAct: Leg press BLEs 125# 15 reps 2 sets; RLE 62# 10 reps 2 sets; LLE 50# 10 reps 2 sets Standing red Theraband kicks with BUE support of cane & chair back 10 reps with abd, ext, add & straight leg flex with ea LE with PT min/CGA.    Neuromuscular Re-education: Side stepping right & left and backwards gait in //bars - 1st lap of each with //bar & cane, then 3 laps with cane & Min to modA for balance.  Standing bilateral stance with cane RUE support:  head turns right/left, up/down & diagonals with minA / tactile cues for balance.  And static stance eyes closed 10 sec 3 reps with minA / tactile cues for balance.   HOME EXERCISE PROGRAM: Do each exercise 1-2  times per day Do each exercise 5-10 repetitions Hold each exercise for 2 seconds to feel your location  AT SINK FIND YOUR MIDLINE POSITION AND PLACE FEET EQUAL DISTANCE FROM THE MIDLINE.  Try to find this position when standing  still for activities.   USE TAPE ON FLOOR TO MARK THE MIDLINE  POSITION which is even with middle of sink.  You also should try to feel with your limb pressure in socket.  You are trying to feel with limb what you used to feel with the bottom of your foot.  Side to Side Shift: Moving your hips only (not shoulders): move weight onto your left leg, HOLD/FEEL pressure in socket.  Move back to equal weight on each leg, HOLD/FEEL pressure in socket. Move weight onto your right leg, HOLD/FEEL pressure in socket. Move back to equal weight on each leg, HOLD/FEEL pressure in socket. Repeat.  Start with both hands on sink, progress to hand on prosthetic side only Front to Back Shift: Moving your hips only (not shoulders): move your weight forward onto your toes, HOLD/FEEL pressure in socket. Move your weight back to equal Flat Foot on both legs, HOLD/FEEL  pressure in socket. Move your weight back onto your heels, HOLD/FEEL  pressure in socket. Move your weight back to equal on both legs, HOLD/FEEL  pressure in socket. Repeat.  Start with both hands on sink, progress to hand on prosthetic side only Moving Cones / Cups: With equal weight on each leg: Hold on with one hand the first time, then progress to no hand supports. Move cups from one side of sink to the other. Place cups ~2 out of your reach, progress to 10 beyond reach.  Place one hand in middle of sink and reach with other hand. Do both arms.  Overhead/Upward Reaching: alternated reaching up to top cabinets or ceiling if no cabinets present. Keep equal weight on each leg. Start with one hand support on counter while other hand reaches and progress to no hand support with reaching.  ace one hand in middle of sink and reach with other hand. Do both arms.  5.   Looking Over Shoulders: With equal weight on each leg: alternate turning to look over your shoulders with one hand support on counter as needed.  Start with head motions only to look in front of shoulder, then even with shoulder and progress to looking behind you. To  look to side, move head /eyes, then shoulder on side looking pulls back, shift more weight to side looking and pull hip back. Place one hand in middle of sink and let go with other hand so your shoulder can pull back. Switch hands to look other way.   6.  Stepping into lower cabinet:  Move items under cabinet out of your way. Shift your hips/pelvis so weight on prosthesis. Tighten muscles in hip on prosthetic side.  SLOWLY step other leg so front of foot is in cabinet. Then step back to floor. Repeat with other leg.    ASSESSMENT:  CLINICAL IMPRESSION: Patient improved his balance with PT activities to facilitate vestibular, proprioceptive and visual input.  He also improved his ability to stand and sit from a 24 inch surface with out upper extremity assist.   Patient will continue to benefit from skilled PT.  OBJECTIVE IMPAIRMENTS: Abnormal gait, decreased activity tolerance, decreased balance, decreased endurance, decreased knowledge of condition, decreased knowledge of use of DME, decreased mobility, difficulty walking, decreased strength, increased edema, postural dysfunction, prosthetic dependency , and impaired skin integrity.   ACTIVITY LIMITATIONS: carrying, lifting, bending, sitting, standing, stairs, transfers, reach over head, and locomotion level  PARTICIPATION LIMITATIONS: meal prep, driving, shopping, community activity, and yard work  PERSONAL FACTORS: Fitness, Time since onset of  injury/illness/exacerbation, and 3+ comorbidities: see PMH are also affecting patient's functional outcome.   REHAB POTENTIAL: Good  CLINICAL DECISION MAKING: Evolving/moderate complexity  EVALUATION COMPLEXITY: Moderate   GOALS: Goals reviewed with patient? Yes  SHORT TERM GOALS: Target date: 01/28/2024  Patient donnes prosthesis modified independent & verbalizes proper cleaning. Baseline: SEE OBJECTIVE DATA Goal status:   MET   01/28/2024 2.  Patient tolerates prosthesis >10 hrs total /day  without skin issues or limb pain >5/10 after standing. Baseline: SEE OBJECTIVE DATA Goal status: MET 01/26/2024  3.  Patient able to reach 7, pick up object from floor and look over both shoulders with RW support with supervision. Baseline: SEE OBJECTIVE DATA Goal status: MET  01/28/2024  4. Patient ambulates 7' with RW & prosthesis with supervision. Baseline: SEE OBJECTIVE DATA Goal status: MET   01/28/2024  5. Patient negotiates ramps & curbs with RW & prosthesis with minA. Baseline: SEE OBJECTIVE DATA Goal status:  MET  01/19/2024  Derk TERM GOALS: Target date: 03/25/2024  Patient demonstrates & verbalized understanding of prosthetic care to enable safe utilization of prosthesis. Baseline: SEE OBJECTIVE DATA Goal status: Ongoing    03/10/2024  Patient tolerates prosthesis wear >90% of awake hours without skin or limb pain issues. Baseline: SEE OBJECTIVE DATA Goal status: Ongoing     03/10/2024  Berg Balance >30/56 to indicate lower fall risk Baseline: SEE OBJECTIVE DATA Goal status: Ongoing    03/10/2024  Patient ambulates >300' with prosthesis & LRAD independently Baseline: SEE OBJECTIVE DATA Goal status: Ongoing   03/10/2024  Patient negotiates ramps, curbs & stairs with single rail with prosthesis & LRAD independently. Baseline: SEE OBJECTIVE DATA Goal status: Ongoing   03/10/2024  Patient reports Patient-Specific Activity Score average to >/= 5 to indicate improvement in functional activities.   Baseline: SEE OBJECTIVE DATA Goal status: Ongoing   03/10/2024  PLAN:  PT FREQUENCY: 2x/week  PT DURATION: 12 weeks  PLANNED INTERVENTIONS: 97164- PT Re-evaluation, 97750- Physical Performance Testing, 97110-Therapeutic exercises, 97530- Therapeutic activity, 97112- Neuromuscular re-education, [phone]- Self Care, [id number]- Gait training, [phone]- Prosthetic Initial , [phone]- Orthotic/Prosthetic subsequent, Patient/Family education, Balance training, Stair training, Scar mobilization,  Vestibular training, and DME instructions  PLAN FOR NEXT SESSION: No resistive activities for 2 weeks following cataract surgery on 1/5. continue prosthetic gait with cane stand alone tip for household, prosthesis and AFO.  Standing balance activities.      Grayce Spatz, PT, DPT 03/10/2024, 9:22 PM  "

## 2024-03-15 ENCOUNTER — Encounter: Admitting: Physical Therapy

## 2024-03-15 ENCOUNTER — Encounter: Payer: Self-pay | Admitting: Physical Therapy

## 2024-03-15 DIAGNOSIS — M6281 Muscle weakness (generalized): Secondary | ICD-10-CM

## 2024-03-15 DIAGNOSIS — R2689 Other abnormalities of gait and mobility: Secondary | ICD-10-CM | POA: Diagnosis not present

## 2024-03-15 DIAGNOSIS — R2681 Unsteadiness on feet: Secondary | ICD-10-CM

## 2024-03-15 DIAGNOSIS — L98498 Non-pressure chronic ulcer of skin of other sites with other specified severity: Secondary | ICD-10-CM | POA: Diagnosis not present

## 2024-03-15 DIAGNOSIS — M21371 Foot drop, right foot: Secondary | ICD-10-CM

## 2024-03-15 NOTE — Therapy (Signed)
 "  OUTPATIENT PHYSICAL THERAPY PROSTHETIC TREATMENT   Patient Name: Scott Navarro. MRN: [id number] DOB:04/30/1965, 59 y.o., male Today's Date: 03/15/2024  END OF SESSION:  PT End of Session - 03/15/24 1302     Visit Number 19    Number of Visits 25    Date for Recertification  03/25/24    Authorization Type [employer] other    Authorization - Visit Number 19    Authorization - Number of Visits 90    PT Start Time 1301    PT Stop Time 1345    PT Time Calculation (min) 44 min    Equipment Utilized During Treatment Gait belt    Activity Tolerance Patient tolerated treatment well    Behavior During Therapy WFL for tasks assessed/performed               Past Medical History:  Diagnosis Date   Type II diabetes mellitus (HCC) dx'd 10/08/2016   Past Surgical History:  Procedure Laterality Date   AMPUTATION Right 10/09/2016   Procedure: INCISION AND DRAINAGE RIGHT GREAT TOE;  Surgeon: Harden Jerona GAILS, MD;  Location: [facility] OR;  Service: Orthopedics;  Laterality: Right;   AMPUTATION Left 07/23/2023   Procedure: AMPUTATION, BELOW KNEE, LEFT;  Surgeon: Harden Jerona GAILS, MD;  Location: [facility] OR;  Service: Orthopedics;  Laterality: Left;  IRRIGATION AND DEBRIDEMENT AND AMPUTATION OF LEFT LEG   IRRIGATION AND DEBRIDEMENT KNEE Left 07/25/2023   Procedure: IRRIGATION AND DEBRIDEMENT LEFT KNEE;  Surgeon: Harden Jerona GAILS, MD;  Location: [facility] OR;  Service: Orthopedics;  Laterality: Left;  DEBRIDEMENT LEFT LEG   Patient Active Problem List   Diagnosis Date Noted   Coping style affecting medical condition 08/05/2023   S/P BKA (below knee amputation), left (HCC) 07/30/2023   ARF (acute renal failure) 07/23/2023   Hyperlipidemia 07/23/2023   Necrotizing fasciitis (HCC) 07/22/2023   Anemia 07/22/2023   Diabetic retinopathy associated with type 2 diabetes mellitus (HCC) 02/18/2023   Charcot joint of left foot 02/18/2023   Encounter for lipid screening for cardiovascular disease 02/18/2023   Screening for  prostate cancer 02/18/2023   Type 2 diabetes mellitus with hyperglycemia (HCC) 11/28/2022   Gait instability 11/28/2022   Retinal hemorrhage 11/21/2022   Glaucoma 11/21/2022   Achilles tendon contracture, right 11/21/2016   Class 1 obesity due to excess calories with body mass index (BMI) of 33.0 to 33.9 in adult    Diabetic polyneuropathy associated with type 2 diabetes mellitus (HCC)     PCP: Bulah Alm RAMAN, PA-C  REFERRING PROVIDER: Maurilio CHRISTELLA Collet, PA-C  ONSET DATE:  12/26/2023 prosthesis delivery  REFERRING DIAG: S10.487 (ICD-10-CM) - S/P BKA (below knee amputation), left   THERAPY DIAG:  Other abnormalities of gait and mobility  Unsteadiness on feet  Muscle weakness (generalized)  Foot drop, right  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   PERTINENT HISTORY: left TTA 07/23/23, blind in right eye, DM2, polyneuropathy, Acute renal failure, HLD  PAIN:  Are you having pain? No  PRECAUTIONS: Fall  RED FLAGS: None   WEIGHT BEARING RESTRICTIONS: No  FALLS: Has patient fallen in last 6 months? No  LIVING ENVIRONMENT: Lives with: lives with spouse and mother with Alzheimer's. He is currently living with son who had recent medical issue making him bed / wheelchair bound.  His wife & he are  son's caregivers but think is not permanent situation.   Lives in: House his house is 2 story development worker, community / bedroom main level) and son's house is single level (where they are currently staying).  Home Access: 3 steps to main entrance to his home and son's house has ramp.  Stairs: Yes: Internal: 14 steps; on right going up and External: 3 steps; on left going up Has following equipment at home: Single point cane, Walker - 2 wheeled, Wheelchair (manual), Shower bench, and bed side commode  PLOF:  Independent, Independent with household mobility without device, and Independent with community mobility without device  Occupation: retired  PATIENT GOALS: to use prosthesis normal activities: driving, shopping, yard work,  active at place in [employer],   OBJECTIVE:  Patient-Specific Activity Scoring Scheme  0 represents unable to perform. 10 represents able to perform at prior level. 0 1 2 3 4 5 6 7 8 9  10 (Date and Score)   Activity Eval 12/29/23 02/18/24   1. Care & wear of prosthesis  1 7   2. Standing ADLs   2 5   3. Walking  2 5  4. Outdoor activities / curb / ramps/ stairs / uneven 0 2  5.    Score 1.25 4.75   Total score = sum of the activity scores/number of activities Minimum detectable change (90%CI) for average score = 2 points Minimum detectable change (90%CI) for single activity score = 3 points  COGNITION: Evaluation on 12/29/2023: Overall cognitive status: Within functional limits for tasks assessed   SENSATION: Evaluation on 12/29/2023: [facility]  CARDIOVASCULAR RESPONSE: Evaluation on 12/29/2023: Functional activity: standing balance & gait Post-activity vitals: HR: 116 SpO2: 100% Modified Borg scale for dyspnea: 2: mild shortness of breath  POSTURE:  Evaluation on 12/29/2023: rounded shoulders, forward head, flexed trunk , and weight shift right  LOWER EXTREMITY ROM:  ROM P:passive  A:active Left Eval 12/29/23  Hip flexion   Hip extension   Hip abduction   Hip adduction   Hip internal rotation   Hip external rotation   Knee flexion   Knee extension 0*  Ankle dorsiflexion   Ankle plantarflexion   Ankle inversion   Ankle eversion    (Blank rows = not tested)  LOWER EXTREMITY MMT:  MMT Right Eval 12/29/23 Left Eval 12/2723  Hip flexion    Hip extension 3/5   Hip abduction 3/5   Hip adduction    Hip internal rotation    Hip external rotation    Knee flexion 3/5   Knee extension 3/5 4/5  Ankle dorsiflexion  2-/5   Ankle plantarflexion    Ankle inversion    Ankle eversion    At Evaluation all strength testing is grossly seated and functionally standing / gait. (Blank rows = not tested)  TRANSFERS: Evaluation on 12/29/2023: Sit to stand: SBA from 22 w/c requires use of armrests and back of legs against chair &/or RW for stabilization. Stand to sit: SBA RW to 22 w/c requires use of armrests and back of legs against chair &/or RW for stabilization.  FUNCTIONAL TESTs:  01/28/2024:  with locked rollator walker support & PT supervision: pt able to reach 7 anteriorly, pick up item from floor and look over his shoulders.  Evaluation on 12/29/2023: Lars Balance 12/56  [facility] PT Assessment - 12/29/23 0900       Standardized Balance Assessment   Standardized Balance Assessment Berg Balance Test      [facility] Balance Test  Sit to Stand Needs minimal aid to stand or to stabilize    Standing Unsupported Needs several tries to stand 30 seconds unsupported    Sitting with Back Unsupported but Feet Supported on Floor or Stool Able to sit safely and securely 2 minutes    Stand to Sit Controls descent by using hands    Transfers Able to transfer safely, definite need of hands    Standing Unsupported with Eyes Closed Needs help to keep from falling    Standing Unsupported with Feet Together Needs help to attain position and unable to hold for 15 seconds    From Standing, Reach Forward with Outstretched Arm Loses balance while trying/requires external support    From Standing Position, Pick up Object from Floor Unable to try/needs assist to keep balance    From Standing Position, Turn to Look Behind Over each Shoulder Needs assist to keep from losing balance and falling    Turn 360 Degrees Needs assistance while turning    Standing Unsupported, Alternately Place Feet on Step/Stool Needs assistance to keep from falling or unable to try    Standing Unsupported, One Foot in [employer] balance while stepping or  standing    Standing on One Leg Unable to try or needs assist to prevent fall    Total Score 12    Berg comment: < 36 high risk for falls (close to 100%) 46-51 moderate (>50%)   37-45 significant (>80%) 52-55 lower (> 25%)          GAIT: 01/28/2024: Pt amb 150' with rollator walker with supervision.  PT neg ramp & curb with rollator walker with min/CGA.  Evaluation on 12/29/2023: Distance walked: 60' Assistive device utilized: [employer] - 2 wheeled and TTA prosthesis Level of assistance: Min A (multiple balance losses requiring PT assist to stabilize especially in turns) Gait pattern: step through pattern, decreased step length- Right, decreased stance time- Left, decreased hip/knee flexion- Left, decreased ankle dorsiflexion- Right, Right hip hike, Left hip hike, genu recurvatum- Right, antalgic, trendelenburg, lateral hip instability, trunk flexed, and poor foot clearance- Right, left knee instability / flexion moment intermittently  Gait velocity:  0.97 ft/sec Comments: excess UE weight bearing on RW.    CURRENT PROSTHETIC WEAR ASSESSMENT: 02/23/2024: Pt tolerates wearing prosthesis and AFO most of awake hours (up to 16 hours) without skin or limb pain issues.   Evaluation on 12/29/2023:  Patient is dependent with: skin check, residual limb care, care of non-amputated limb, prosthetic cleaning, ply sock cleaning, correct ply sock adjustment, proper wear schedule/adjustment, and proper weight-bearing schedule/adjustment Donning prosthesis: SBA Doffing prosthesis: SBA Prosthetic wear tolerance: up to 1.5 hours, 2x/day, 2 of 3 days since delivery Prosthetic weight bearing tolerance: 5 minutes with no c/o residual limb pain Edema: pitting Residual limb condition: cylindrical shape, 3 cm superficial scab at anterior-lateral limb with no signs of infection, small wound on lateral limb proximal to fibular head with white covering 0.4 cm, invaginated scar lateral aspect, dry flaking skin, no  hair growth, redness over patella (friction from liner when seated), normal temperature.  Prosthetic description: silicon (9 mm anterior portion) with pin lock suspension, total contact socket with flexible inner socket.    TODAY'S TREATMENT:  DATE:  03/15/2024: Prosthetic Care with left transtiial prosthesis and right blue rocker AFO  Patient ambulated 80' x 2 and 110' x 1 with cane stand-alone tip with minA.  PT cueing on upright posture, sequencing and balance reactions.  Patient had improved balance with gait on the second walk which was post balance work noted below. Pt neg ramp with cane stand alone tip modA for balance. 2 reps with HHA and 3rd rep with intermittent touch. Pt neg curb with cane stand alone tip with HHA modA for balance / stability 3 reps.  PT cues on technique.   Therapeutic activities Patient performed sit to stand from 24 barstool with out upper extremity assist including goal not to touch parallel bars for stabilization 10 reps 2 sets with minA for stabilization and tactile/verbal correctional cues.  Neuromuscular Re-education:.  Standing balance activities with minA for stabilization and tactile/verbal correctional cues.  PT encouraged use of mirror for visual feedback to stabilize between motions each direction.  Patient performed head motions right/midline/left, up/midline/down and diagonals.  Patient performed each direction standing on floor with eyes open followed by eyes closed.  Patient performed each direction standing on foam with eyes open which caused greater balance disturbances.  Patient was able to improve stabilization with repetition and PT cueing. Standing back movements working on balance: back extensors placing hands on 18 chair seat to upright, side bend placing hands on 18 chair seat to upright both to right & to left and active  back extension.    TREATMENT:                                                                                                                             DATE:  03/10/2024: Prosthetic Care with left transtiial prosthesis and right blue rocker AFO  Patient ambulated 80' x 2 and 110' x 1 with cane stand-alone tip with minA.  PT cueing on upright posture, sequencing and balance reactions.  Patient had improved balance with gait on the second walk which was post balance work noted below.  Therapeutic activities PT demo and verbal cues on sit to/from stand technique with focus on weight shift over feet as he lowers and raises body weight.  Patient performed sit to stand from 24 barstool with out upper extremity assist including goal not to touch parallel bars for stabilization 10 reps 2 sets with minA for stabilization and tactile/verbal correctional cues.  Neuromuscular Re-education:.  Standing balance activities with minA for stabilization and tactile/verbal correctional cues.  PT encouraged use of mirror for visual feedback to stabilize between motions each direction.  Patient performed head motions right/midline/left, up/midline/down and diagonals.  Patient performed each direction standing on floor with eyes open followed by eyes closed.  Patient performed each direction standing on foam with eyes open which caused greater balance disturbances.  Patient was able to improve stabilization with repetition and PT cueing.   TREATMENT:  DATE:  02/25/2024: Prosthetic Care with left transtiial prosthesis and right blue rocker AFO  Patient ambulated 80' x 2 and 110' x 1 with cane stand-alone tip and handhold assist /minA.  PT cueing on upright posture, sequencing and balance reactions. Pt neg ramp & curb with cane & modA/HHA 2 reps ea with PT demo & verbal cues on technique.    TherAct: Leg press BLEs 131# 15 reps 2 sets; marching for stance LE control 125# 15 reps 2 sets;  RLE 68# 6 reps (max reps at higher weight) then 62# 10 reps; LLE 56# 10 reps 2 sets.  Standing green Theraband kicks with BUE support of cane & chair back 10 reps with abd, ext, add & straight leg flex with ea LE with PT min/CGA.    Neuromuscular Re-education:.  Pt amb against resistance (green theraband LUE) with cane RUE - walk out, hold against resistance 5 sec, then walk back under control.  5 reps with resistance anteriorly and 5 reps resistance posteriorly. ModA for balance / stabilization.    TREATMENT:                                                                                                                             DATE:  02/23/2024: Prosthetic Care with left transtiial prosthesis and right blue rocker AFO  Patient ambulated 80' x 3 with cane stand-alone tip and handhold assist /minA.  PT cueing on upright posture, sequencing and balance reactions.  Pt neg stairs with 2 rails step-to pattern alternating lead LE for 3 steps ea LE with CGA.  Attempted descending with single rail & cane but pt's LE still too weak to control motion with reduced UE support.  PT reviewed adjusting ply socks with possibility of shower at night to limit limb swelling in morning prior to need to don prosthesis.  PT recommended donning prosthesis upon arising if he is not immediately getting into shower.  Pt verbalized understanding.   TherAct: Leg press BLEs 131# 15 reps; marching for stance LE control 125# 15 reps;  RLE 68# 4 reps (max reps at higher weight) then 62# 10 reps; LLE 56# 5 reps  (max reps at higher weight), then 50# 10 reps Standing red Theraband kicks with BUE support of cane & chair back 10 reps with abd, ext, add & straight leg flex with ea LE with PT min/CGA.    Neuromuscular Re-education:.  Standing bilateral stance with PT minA / tactile cues for support:  head turns right/left,  up/down & diagonals.  And static stance eyes closed 10 sec 3 reps with minA / tactile cues for balance. Standing crossways on foam beam 15 sec 3 reps with minA  / tactile cues for balance.    HOME EXERCISE PROGRAM: Do each exercise 1-2  times per day Do each exercise 5-10 repetitions Hold each exercise for 2 seconds to feel your location  AT [facility] FIND YOUR MIDLINE POSITION AND PLACE FEET EQUAL DISTANCE  FROM THE MIDLINE.  Try to find this position when standing still for activities.   USE TAPE ON FLOOR TO MARK THE MIDLINE POSITION which is even with middle of sink.  You also should try to feel with your limb pressure in socket.  You are trying to feel with limb what you used to feel with the bottom of your foot.  Side to Side Shift: Moving your hips only (not shoulders): move weight onto your left leg, HOLD/FEEL pressure in socket.  Move back to equal weight on each leg, HOLD/FEEL pressure in socket. Move weight onto your right leg, HOLD/FEEL pressure in socket. Move back to equal weight on each leg, HOLD/FEEL pressure in socket. Repeat.  Start with both hands on sink, progress to hand on prosthetic side only Front to Back Shift: Moving your hips only (not shoulders): move your weight forward onto your toes, HOLD/FEEL pressure in socket. Move your weight back to equal Flat Foot on both legs, HOLD/FEEL  pressure in socket. Move your weight back onto your heels, HOLD/FEEL  pressure in socket. Move your weight back to equal on both legs, HOLD/FEEL  pressure in socket. Repeat.  Start with both hands on sink, progress to hand on prosthetic side only Moving Cones / Cups: With equal weight on each leg: Hold on with one hand the first time, then progress to no hand supports. Move cups from one side of sink to the other. Place cups ~2 out of your reach, progress to 10 beyond reach.  Place one hand in middle of sink and reach with other hand. Do both arms.  Overhead/Upward Reaching: alternated reaching up to  top cabinets or ceiling if no cabinets present. Keep equal weight on each leg. Start with one hand support on counter while other hand reaches and progress to no hand support with reaching.  ace one hand in middle of sink and reach with other hand. Do both arms.  5.   Looking Over Shoulders: With equal weight on each leg: alternate turning to look over your shoulders with one hand support on counter as needed.  Start with head motions only to look in front of shoulder, then even with shoulder and progress to looking behind you. To look to side, move head /eyes, then shoulder on side looking pulls back, shift more weight to side looking and pull hip back. Place one hand in middle of sink and let go with other hand so your shoulder can pull back. Switch hands to look other way.   6.  Stepping into lower cabinet:  Move items under cabinet out of your way. Shift your hips/pelvis so weight on prosthesis. Tighten muscles in hip on prosthetic side.  SLOWLY step other leg so front of foot is in cabinet. Then step back to floor. Repeat with other leg.    ASSESSMENT:  CLINICAL IMPRESSION: Patient continues to have balance issues limiting gait with cane especially on ramp & curb. Pt was able to improve balance activities with PT instruction and repetition.   Patient will continue to benefit from skilled PT.  OBJECTIVE IMPAIRMENTS: Abnormal gait, decreased activity tolerance, decreased balance, decreased endurance, decreased knowledge of condition, decreased knowledge of use of DME, decreased mobility, difficulty walking, decreased strength, increased edema, postural dysfunction, prosthetic dependency , and impaired skin integrity.   ACTIVITY LIMITATIONS: carrying, lifting, bending, sitting, standing, stairs, transfers, reach over head, and locomotion level  PARTICIPATION LIMITATIONS: meal prep, driving, shopping, community activity, and yard work  PERSONAL FACTORS: Fitness, Time  since onset of  injury/illness/exacerbation, and 3+ comorbidities: see PMH are also affecting patient's functional outcome.   REHAB POTENTIAL: Good  CLINICAL DECISION MAKING: Evolving/moderate complexity  EVALUATION COMPLEXITY: Moderate   GOALS: Goals reviewed with patient? Yes  SHORT TERM GOALS: Target date: 01/28/2024  Patient donnes prosthesis modified independent & verbalizes proper cleaning. Baseline: SEE OBJECTIVE DATA Goal status:   MET   01/28/2024 2.  Patient tolerates prosthesis >10 hrs total /day without skin issues or limb pain >5/10 after standing. Baseline: SEE OBJECTIVE DATA Goal status: MET 01/26/2024  3.  Patient able to reach 7, pick up object from floor and look over both shoulders with RW support with supervision. Baseline: SEE OBJECTIVE DATA Goal status: MET  01/28/2024  4. Patient ambulates 39' with RW & prosthesis with supervision. Baseline: SEE OBJECTIVE DATA Goal status: MET   01/28/2024  5. Patient negotiates ramps & curbs with RW & prosthesis with minA. Baseline: SEE OBJECTIVE DATA Goal status:  MET  01/19/2024  [place] TERM GOALS: Target date: 03/25/2024  Patient demonstrates & verbalized understanding of prosthetic care to enable safe utilization of prosthesis. Baseline: SEE OBJECTIVE DATA Goal status: Ongoing    03/15/2024  Patient tolerates prosthesis wear >90% of awake hours without skin or limb pain issues. Baseline: SEE OBJECTIVE DATA Goal status: Ongoing     03/15/2024  Berg Balance >30/56 to indicate lower fall risk Baseline: SEE OBJECTIVE DATA Goal status: Ongoing    03/15/2024  Patient ambulates >300' with prosthesis & LRAD independently Baseline: SEE OBJECTIVE DATA Goal status: Ongoing   03/15/2024  Patient negotiates ramps, curbs & stairs with single rail with prosthesis & LRAD independently. Baseline: SEE OBJECTIVE DATA Goal status: Ongoing   03/15/2024  Patient reports Patient-Specific Activity Score average to >/= 5 to indicate improvement in  functional activities.   Baseline: SEE OBJECTIVE DATA Goal status: Ongoing   03/15/2024  PLAN:  PT FREQUENCY: 2x/week  PT DURATION: 12 weeks  PLANNED INTERVENTIONS: 97164- PT Re-evaluation, 97750- Physical Performance Testing, 97110-Therapeutic exercises, 97530- Therapeutic activity, [id number]- Neuromuscular re-education, [phone]- Self Care, [id number]- Gait training, [phone]- Prosthetic Initial , [phone]- Orthotic/Prosthetic subsequent, Patient/Family education, Balance training, Stair training, Scar mobilization, Vestibular training, and DME instructions  PLAN FOR NEXT SESSION: check on appt with eye doctor.  No resistive activities for 2 weeks following cataract surgery on 1/5. continue prosthetic gait with cane stand alone tip for household, prosthesis and AFO.  Standing balance activities.      Grayce Spatz, PT, DPT 03/15/2024, 3:40 PM  "

## 2024-03-18 ENCOUNTER — Ambulatory Visit: Admitting: Physical Therapy

## 2024-03-18 ENCOUNTER — Encounter: Payer: Self-pay | Admitting: Physical Therapy

## 2024-03-18 DIAGNOSIS — R2681 Unsteadiness on feet: Secondary | ICD-10-CM | POA: Diagnosis not present

## 2024-03-18 DIAGNOSIS — M21371 Foot drop, right foot: Secondary | ICD-10-CM | POA: Diagnosis not present

## 2024-03-18 DIAGNOSIS — L98498 Non-pressure chronic ulcer of skin of other sites with other specified severity: Secondary | ICD-10-CM

## 2024-03-18 DIAGNOSIS — M6281 Muscle weakness (generalized): Secondary | ICD-10-CM | POA: Diagnosis not present

## 2024-03-18 DIAGNOSIS — R2689 Other abnormalities of gait and mobility: Secondary | ICD-10-CM

## 2024-03-18 LAB — COLOGUARD: COLOGUARD: NEGATIVE

## 2024-03-18 NOTE — Therapy (Signed)
 "  OUTPATIENT PHYSICAL THERAPY PROSTHETIC TREATMENT   Patient Name: Scott Navarro. MRN: [id number] DOB:10/03/1965, 59 y.o., male Today's Date: 03/18/2024  END OF SESSION:  PT End of Session - 03/18/24 1149     Visit Number 20    Number of Visits 25    Date for Recertification  03/25/24    Authorization Type [employer] other    Authorization - Number of Visits 90    PT Start Time 1147    PT Stop Time 1227    PT Time Calculation (min) 40 min    Equipment Utilized During Treatment Gait belt    Activity Tolerance Patient tolerated treatment well    Behavior During Therapy WFL for tasks assessed/performed               Past Medical History:  Diagnosis Date   Type II diabetes mellitus (HCC) dx'd 10/08/2016   Past Surgical History:  Procedure Laterality Date   AMPUTATION Right 10/09/2016   Procedure: INCISION AND DRAINAGE RIGHT GREAT TOE;  Surgeon: Harden Jerona GAILS, MD;  Location: [facility] OR;  Service: Orthopedics;  Laterality: Right;   AMPUTATION Left 07/23/2023   Procedure: AMPUTATION, BELOW KNEE, LEFT;  Surgeon: Harden Jerona GAILS, MD;  Location: [facility] OR;  Service: Orthopedics;  Laterality: Left;  IRRIGATION AND DEBRIDEMENT AND AMPUTATION OF LEFT LEG   IRRIGATION AND DEBRIDEMENT KNEE Left 07/25/2023   Procedure: IRRIGATION AND DEBRIDEMENT LEFT KNEE;  Surgeon: Harden Jerona GAILS, MD;  Location: [facility] OR;  Service: Orthopedics;  Laterality: Left;  DEBRIDEMENT LEFT LEG   Patient Active Problem List   Diagnosis Date Noted   Coping style affecting medical condition 08/05/2023   S/P BKA (below knee amputation), left (HCC) 07/30/2023   ARF (acute renal failure) 07/23/2023   Hyperlipidemia 07/23/2023   Necrotizing fasciitis (HCC) 07/22/2023   Anemia 07/22/2023   Diabetic retinopathy associated with type 2 diabetes mellitus (HCC) 02/18/2023   Charcot joint of left foot 02/18/2023   Encounter for lipid screening for cardiovascular disease 02/18/2023   Screening for prostate cancer 02/18/2023   Type 2  diabetes mellitus with hyperglycemia (HCC) 11/28/2022   Gait instability 11/28/2022   Retinal hemorrhage 11/21/2022   Glaucoma 11/21/2022   Achilles tendon contracture, right 11/21/2016   Class 1 obesity due to excess calories with body mass index (BMI) of 33.0 to 33.9 in adult    Diabetic polyneuropathy associated with type 2 diabetes mellitus (HCC)     PCP: Bulah Alm RAMAN, PA-C  REFERRING PROVIDER: Maurilio CHRISTELLA Collet, PA-C  ONSET DATE:  12/26/2023 prosthesis delivery  REFERRING DIAG: S10.487 (ICD-10-CM) - S/P BKA (below knee amputation), left   THERAPY DIAG:  Other abnormalities of gait and mobility  Unsteadiness on feet  Muscle weakness (generalized)  Foot drop, right  Non-pressure chronic ulcer of skin of other sites with other specified severity [facility])  Rationale for Evaluation and Treatment: Rehabilitation  SUBJECTIVE:   SUBJECTIVE STATEMENT: Eye doctor for retinopathy gave him shots in left eye.  His right eye following cataract surgery is improved from no vision to some. He is off restrictions for no weights but will back as he is having cataract surgery on left eye on Monday.    PERTINENT HISTORY: left TTA 07/23/23, blind in right eye, DM2, polyneuropathy, Acute renal failure, HLD  PAIN:  Are you having pain? No  PRECAUTIONS: Fall  RED FLAGS: None   WEIGHT BEARING RESTRICTIONS: No  FALLS: Has patient fallen in last 6 months? No  LIVING ENVIRONMENT: Lives  with: lives with spouse and mother with Alzheimer's. He is currently living with son who had recent medical issue making him bed / wheelchair bound.  His wife & he are son's caregivers but think is not permanent situation.   Lives in: House his house is 2 story development worker, community / bedroom main level) and son's house is single level (where they are currently staying).  Home Access: 3 steps to main entrance to his home and son's house has ramp.  Stairs: Yes: Internal: 14 steps; on right going up and External: 3  steps; on left going up Has following equipment at home: Single point cane, Walker - 2 wheeled, Wheelchair (manual), Shower bench, and bed side commode  PLOF: Independent, Independent with household mobility without device, and Independent with community mobility without device  Occupation: retired  PATIENT GOALS: to use prosthesis normal activities: driving, shopping, yard work,  active at place in [employer],   OBJECTIVE:  Patient-Specific Activity Scoring Scheme  0 represents unable to perform. 10 represents able to perform at prior level. 0 1 2 3 4 5 6 7 8 9  10 (Date and Score)   Activity Eval 12/29/23 02/18/24   1. Care & wear of prosthesis  1 7   2. Standing ADLs   2 5   3. Walking  2 5  4. Outdoor activities / curb / ramps/ stairs / uneven 0 2  5.    Score 1.25 4.75   Total score = sum of the activity scores/number of activities Minimum detectable change (90%CI) for average score = 2 points Minimum detectable change (90%CI) for single activity score = 3 points  COGNITION: Evaluation on 12/29/2023: Overall cognitive status: Within functional limits for tasks assessed   SENSATION: Evaluation on 12/29/2023: [facility]  CARDIOVASCULAR RESPONSE: Evaluation on 12/29/2023: Functional activity: standing balance & gait Post-activity vitals: HR: 116 SpO2: 100% Modified Borg scale for dyspnea: 2: mild shortness of breath  POSTURE:  Evaluation on 12/29/2023: rounded shoulders, forward head, flexed trunk , and weight shift right  LOWER EXTREMITY ROM:  ROM P:passive  A:active Left Eval 12/29/23  Hip flexion   Hip extension   Hip abduction   Hip adduction   Hip internal rotation   Hip external rotation   Knee flexion   Knee extension 0*  Ankle dorsiflexion   Ankle plantarflexion   Ankle inversion   Ankle eversion    (Blank rows = not tested)  LOWER EXTREMITY MMT:  MMT Right Eval 12/29/23 Left Eval 12/2723  Hip flexion    Hip extension 3/5    Hip abduction 3/5   Hip adduction    Hip internal rotation    Hip external rotation    Knee flexion 3/5   Knee extension 3/5 4/5  Ankle dorsiflexion  2-/5  Ankle plantarflexion    Ankle inversion    Ankle eversion    At Evaluation all strength testing is grossly seated and functionally standing / gait. (Blank rows = not tested)  TRANSFERS: Evaluation on 12/29/2023: Sit to stand: SBA from 22 w/c requires use of armrests and back of legs against chair &/or RW for stabilization. Stand to sit: SBA RW to 22 w/c requires use of armrests and back of legs against chair &/or RW for stabilization.  FUNCTIONAL TESTs:  01/28/2024:  with locked rollator walker support & PT supervision: pt able to reach 7 anteriorly, pick up item from floor and look over his shoulders.  Evaluation on 12/29/2023: Lars Balance 12/56  [facility] PT  Assessment - 12/29/23 0900       Standardized Balance Assessment   Standardized Balance Assessment Berg Balance Test      Berg Balance Test   Sit to Stand Needs minimal aid to stand or to stabilize    Standing Unsupported Needs several tries to stand 30 seconds unsupported    Sitting with Back Unsupported but Feet Supported on Floor or Stool Able to sit safely and securely 2 minutes    Stand to Sit Controls descent by using hands    Transfers Able to transfer safely, definite need of hands    Standing Unsupported with Eyes Closed Needs help to keep from falling    Standing Unsupported with Feet Together Needs help to attain position and unable to hold for 15 seconds    From Standing, Reach Forward with Outstretched Arm Loses balance while trying/requires external support    From Standing Position, Pick up Object from Floor Unable to try/needs assist to keep balance    From Standing Position, Turn to Look Behind Over each Shoulder Needs assist to keep from losing balance and falling    Turn 360 Degrees Needs assistance while turning    Standing Unsupported,  Alternately Place Feet on Step/Stool Needs assistance to keep from falling or unable to try    Standing Unsupported, One Foot in [employer] balance while stepping or standing    Standing on One Leg Unable to try or needs assist to prevent fall    Total Score 12    Berg comment: < 36 high risk for falls (close to 100%) 46-51 moderate (>50%)   37-45 significant (>80%) 52-55 lower (> 25%)          GAIT: 01/28/2024: Pt amb 150' with rollator walker with supervision.  PT neg ramp & curb with rollator walker with min/CGA.  Evaluation on 12/29/2023: Distance walked: 60' Assistive device utilized: [employer] - 2 wheeled and TTA prosthesis Level of assistance: Min A (multiple balance losses requiring PT assist to stabilize especially in turns) Gait pattern: step through pattern, decreased step length- Right, decreased stance time- Left, decreased hip/knee flexion- Left, decreased ankle dorsiflexion- Right, Right hip hike, Left hip hike, genu recurvatum- Right, antalgic, trendelenburg, lateral hip instability, trunk flexed, and poor foot clearance- Right, left knee instability / flexion moment intermittently  Gait velocity:  0.97 ft/sec Comments: excess UE weight bearing on RW.    CURRENT PROSTHETIC WEAR ASSESSMENT: 02/23/2024: Pt tolerates wearing prosthesis and AFO most of awake hours (up to 16 hours) without skin or limb pain issues.   Evaluation on 12/29/2023:  Patient is dependent with: skin check, residual limb care, care of non-amputated limb, prosthetic cleaning, ply sock cleaning, correct ply sock adjustment, proper wear schedule/adjustment, and proper weight-bearing schedule/adjustment Donning prosthesis: SBA Doffing prosthesis: SBA Prosthetic wear tolerance: up to 1.5 hours, 2x/day, 2 of 3 days since delivery Prosthetic weight bearing tolerance: 5 minutes with no c/o residual limb pain Edema: pitting Residual limb condition: cylindrical shape, 3 cm superficial scab at anterior-lateral  limb with no signs of infection, small wound on lateral limb proximal to fibular head with white covering 0.4 cm, invaginated scar lateral aspect, dry flaking skin, no hair growth, redness over patella (friction from liner when seated), normal temperature.  Prosthetic description: silicon (9 mm anterior portion) with pin lock suspension, total contact socket with flexible inner socket.    TODAY'S TREATMENT:  DATE:  03/18/2024: Prosthetic Care with left transtiial prosthesis and right blue rocker AFO  Patient ambulated 80' x 2 and 110' x 1 with cane stand-alone tip with minA.  PT cueing on upright posture, sequencing and balance reactions.  Patient had improved balance with gait on the second walk which was post balance work noted below. Pt neg ramp with cane stand alone tip modA for balance.  Pt neg curb with cane stand alone tip with modA for balance / stability 3 reps.   TherAct: Leg press BLEs 131# 15 reps; marching for stance LE control 125# 15 reps;  RLE 68# 6 reps (max reps at higher weight) then 62# 10 reps; LLE 62# 6 reps then 56# 2 reps, 50# 10 reps Standing green Theraband kicks with BUE support of cane & chair back 10 reps with abd, ext, add & straight leg flex with ea LE with PT min/CGA then 2 reps with cane support & minA for all 4 motions.     Neuromuscular Re-education:.  Pt amb against resistance (green theraband LUE) with cane RUE - walk out, hold against resistance 5 sec, then walk back under control.  5 reps with resistance anteriorly and 5 reps resistance posteriorly. ModA for balance / stabilization.    TREATMENT:                                                                                                                             DATE:  03/15/2024: Prosthetic Care with left transtiial prosthesis and right blue rocker AFO  Patient ambulated 80' x 2  and 110' x 1 with cane stand-alone tip with minA.  PT cueing on upright posture, sequencing and balance reactions.  Patient had improved balance with gait on the second walk which was post balance work noted below. Pt neg ramp with cane stand alone tip modA for balance. 2 reps with HHA and 3rd rep with intermittent touch. Pt neg curb with cane stand alone tip with HHA modA for balance / stability 3 reps.  PT cues on technique.   Therapeutic activities Patient performed sit to stand from 24 barstool with out upper extremity assist including goal not to touch parallel bars for stabilization 10 reps 2 sets with minA for stabilization and tactile/verbal correctional cues.  Neuromuscular Re-education:.  Standing balance activities with minA for stabilization and tactile/verbal correctional cues.  PT encouraged use of mirror for visual feedback to stabilize between motions each direction.  Patient performed head motions right/midline/left, up/midline/down and diagonals.  Patient performed each direction standing on floor with eyes open followed by eyes closed.  Patient performed each direction standing on foam with eyes open which caused greater balance disturbances.  Patient was able to improve stabilization with repetition and PT cueing. Standing back movements working on balance: back extensors placing hands on 18 chair seat to upright, side bend placing hands on 18 chair seat to upright both to right & to left and active back extension.  TREATMENT:                                                                                                                             DATE:  03/10/2024: Prosthetic Care with left transtiial prosthesis and right blue rocker AFO  Patient ambulated 80' x 2 and 110' x 1 with cane stand-alone tip with minA.  PT cueing on upright posture, sequencing and balance reactions.  Patient had improved balance with gait on the second walk which was post balance work noted  below.  Therapeutic activities PT demo and verbal cues on sit to/from stand technique with focus on weight shift over feet as he lowers and raises body weight.  Patient performed sit to stand from 24 barstool with out upper extremity assist including goal not to touch parallel bars for stabilization 10 reps 2 sets with minA for stabilization and tactile/verbal correctional cues.  Neuromuscular Re-education:.  Standing balance activities with minA for stabilization and tactile/verbal correctional cues.  PT encouraged use of mirror for visual feedback to stabilize between motions each direction.  Patient performed head motions right/midline/left, up/midline/down and diagonals.  Patient performed each direction standing on floor with eyes open followed by eyes closed.  Patient performed each direction standing on foam with eyes open which caused greater balance disturbances.  Patient was able to improve stabilization with repetition and PT cueing.   TREATMENT:                                                                                                                             DATE:  02/25/2024: Prosthetic Care with left transtiial prosthesis and right blue rocker AFO  Patient ambulated 80' x 2 and 110' x 1 with cane stand-alone tip and handhold assist /minA.  PT cueing on upright posture, sequencing and balance reactions. Pt neg ramp & curb with cane & modA/HHA 2 reps ea with PT demo & verbal cues on technique.   TherAct: Leg press BLEs 131# 15 reps 2 sets; marching for stance LE control 125# 15 reps 2 sets;  RLE 68# 6 reps (max reps at higher weight) then 62# 10 reps; LLE 56# 10 reps 2 sets.  Standing green Theraband kicks with BUE support of cane & chair back 10 reps with abd, ext, add & straight leg flex with ea LE with PT min/CGA.    Neuromuscular Re-education:.  Pt amb against resistance (green theraband LUE)  with cane RUE - walk out, hold against resistance 5 sec, then walk back under  control.  5 reps with resistance anteriorly and 5 reps resistance posteriorly. ModA for balance / stabilization.     HOME EXERCISE PROGRAM: Do each exercise 1-2  times per day Do each exercise 5-10 repetitions Hold each exercise for 2 seconds to feel your location  AT SINK FIND YOUR MIDLINE POSITION AND PLACE FEET EQUAL DISTANCE FROM THE MIDLINE.  Try to find this position when standing still for activities.   USE TAPE ON FLOOR TO MARK THE MIDLINE POSITION which is even with middle of sink.  You also should try to feel with your limb pressure in socket.  You are trying to feel with limb what you used to feel with the bottom of your foot.  Side to Side Shift: Moving your hips only (not shoulders): move weight onto your left leg, HOLD/FEEL pressure in socket.  Move back to equal weight on each leg, HOLD/FEEL pressure in socket. Move weight onto your right leg, HOLD/FEEL pressure in socket. Move back to equal weight on each leg, HOLD/FEEL pressure in socket. Repeat.  Start with both hands on sink, progress to hand on prosthetic side only Front to Back Shift: Moving your hips only (not shoulders): move your weight forward onto your toes, HOLD/FEEL pressure in socket. Move your weight back to equal Flat Foot on both legs, HOLD/FEEL  pressure in socket. Move your weight back onto your heels, HOLD/FEEL  pressure in socket. Move your weight back to equal on both legs, HOLD/FEEL  pressure in socket. Repeat.  Start with both hands on sink, progress to hand on prosthetic side only Moving Cones / Cups: With equal weight on each leg: Hold on with one hand the first time, then progress to no hand supports. Move cups from one side of sink to the other. Place cups ~2 out of your reach, progress to 10 beyond reach.  Place one hand in middle of sink and reach with other hand. Do both arms.  Overhead/Upward Reaching: alternated reaching up to top cabinets or ceiling if no cabinets present. Keep equal weight on each  leg. Start with one hand support on counter while other hand reaches and progress to no hand support with reaching.  ace one hand in middle of sink and reach with other hand. Do both arms.  5.   Looking Over Shoulders: With equal weight on each leg: alternate turning to look over your shoulders with one hand support on counter as needed.  Start with head motions only to look in front of shoulder, then even with shoulder and progress to looking behind you. To look to side, move head /eyes, then shoulder on side looking pulls back, shift more weight to side looking and pull hip back. Place one hand in middle of sink and let go with other hand so your shoulder can pull back. Switch hands to look other way.   6.  Stepping into lower cabinet:  Move items under cabinet out of your way. Shift your hips/pelvis so weight on prosthesis. Tighten muscles in hip on prosthetic side.  SLOWLY step other leg so front of foot is in cabinet. Then step back to floor. Repeat with other leg.    ASSESSMENT:  CLINICAL IMPRESSION: Patient is improving his balance with PT activities.    Patient will continue to benefit from skilled PT.  OBJECTIVE IMPAIRMENTS: Abnormal gait, decreased activity tolerance, decreased balance, decreased endurance, decreased knowledge of condition,  decreased knowledge of use of DME, decreased mobility, difficulty walking, decreased strength, increased edema, postural dysfunction, prosthetic dependency , and impaired skin integrity.   ACTIVITY LIMITATIONS: carrying, lifting, bending, sitting, standing, stairs, transfers, reach over head, and locomotion level  PARTICIPATION LIMITATIONS: meal prep, driving, shopping, community activity, and yard work  PERSONAL FACTORS: Fitness, Time since onset of injury/illness/exacerbation, and 3+ comorbidities: see PMH are also affecting patient's functional outcome.   REHAB POTENTIAL: Good  CLINICAL DECISION MAKING: Evolving/moderate complexity  EVALUATION  COMPLEXITY: Moderate   GOALS: Goals reviewed with patient? Yes  SHORT TERM GOALS: Target date: 01/28/2024  Patient donnes prosthesis modified independent & verbalizes proper cleaning. Baseline: SEE OBJECTIVE DATA Goal status:   MET   01/28/2024 2.  Patient tolerates prosthesis >10 hrs total /day without skin issues or limb pain >5/10 after standing. Baseline: SEE OBJECTIVE DATA Goal status: MET 01/26/2024  3.  Patient able to reach 7, pick up object from floor and look over both shoulders with RW support with supervision. Baseline: SEE OBJECTIVE DATA Goal status: MET  01/28/2024  4. Patient ambulates 27' with RW & prosthesis with supervision. Baseline: SEE OBJECTIVE DATA Goal status: MET   01/28/2024  5. Patient negotiates ramps & curbs with RW & prosthesis with minA. Baseline: SEE OBJECTIVE DATA Goal status:  MET  01/19/2024  [place] TERM GOALS: Target date: 03/25/2024  Patient demonstrates & verbalized understanding of prosthetic care to enable safe utilization of prosthesis. Baseline: SEE OBJECTIVE DATA Goal status: Ongoing    03/15/2024  Patient tolerates prosthesis wear >90% of awake hours without skin or limb pain issues. Baseline: SEE OBJECTIVE DATA Goal status: Ongoing     03/15/2024  Berg Balance >30/56 to indicate lower fall risk Baseline: SEE OBJECTIVE DATA Goal status: Ongoing    03/15/2024  Patient ambulates >300' with prosthesis & LRAD independently Baseline: SEE OBJECTIVE DATA Goal status: Ongoing   03/15/2024  Patient negotiates ramps, curbs & stairs with single rail with prosthesis & LRAD independently. Baseline: SEE OBJECTIVE DATA Goal status: Ongoing   03/15/2024  Patient reports Patient-Specific Activity Score average to >/= 5 to indicate improvement in functional activities.   Baseline: SEE OBJECTIVE DATA Goal status: Ongoing   03/15/2024  PLAN:  PT FREQUENCY: 2x/week  PT DURATION: 12 weeks  PLANNED INTERVENTIONS: 97164- PT Re-evaluation, 97750-  Physical Performance Testing, 97110-Therapeutic exercises, 97530- Therapeutic activity, [id number]- Neuromuscular re-education, [phone]- Self Care, [id number]- Gait training, [phone]- Prosthetic Initial , [phone]- Orthotic/Prosthetic subsequent, Patient/Family education, Balance training, Stair training, Scar mobilization, Vestibular training, and DME instructions  PLAN FOR NEXT SESSION: check LTGs with re-certification.     Grayce Spatz, PT, DPT 03/18/2024, 1:08 PM  "

## 2024-03-19 NOTE — Progress Notes (Signed)
 Results thru my chart

## 2024-03-22 ENCOUNTER — Encounter: Admitting: Physical Therapy

## 2024-03-23 NOTE — Therapy (Incomplete)
 "  OUTPATIENT PHYSICAL THERAPY PROSTHETIC TREATMENT   Patient Name: Scott Navarro. MRN: [id number] DOB:Jul 25, 1965, 59 y.o., male Today's Date: 03/23/2024  END OF SESSION:         Past Medical History:  Diagnosis Date   Type II diabetes mellitus (HCC) dx'd 10/08/2016   Past Surgical History:  Procedure Laterality Date   AMPUTATION Right 10/09/2016   Procedure: INCISION AND DRAINAGE RIGHT GREAT TOE;  Surgeon: Harden Jerona GAILS, MD;  Location: [facility] OR;  Service: Orthopedics;  Laterality: Right;   AMPUTATION Left 07/23/2023   Procedure: AMPUTATION, BELOW KNEE, LEFT;  Surgeon: Harden Jerona GAILS, MD;  Location: [facility] OR;  Service: Orthopedics;  Laterality: Left;  IRRIGATION AND DEBRIDEMENT AND AMPUTATION OF LEFT LEG   IRRIGATION AND DEBRIDEMENT KNEE Left 07/25/2023   Procedure: IRRIGATION AND DEBRIDEMENT LEFT KNEE;  Surgeon: Harden Jerona GAILS, MD;  Location: [facility] OR;  Service: Orthopedics;  Laterality: Left;  DEBRIDEMENT LEFT LEG   Patient Active Problem List   Diagnosis Date Noted   Coping style affecting medical condition 08/05/2023   S/P BKA (below knee amputation), left (HCC) 07/30/2023   ARF (acute renal failure) 07/23/2023   Hyperlipidemia 07/23/2023   Necrotizing fasciitis (HCC) 07/22/2023   Anemia 07/22/2023   Diabetic retinopathy associated with type 2 diabetes mellitus (HCC) 02/18/2023   Charcot joint of left foot 02/18/2023   Encounter for lipid screening for cardiovascular disease 02/18/2023   Screening for prostate cancer 02/18/2023   Type 2 diabetes mellitus with hyperglycemia (HCC) 11/28/2022   Gait instability 11/28/2022   Retinal hemorrhage 11/21/2022   Glaucoma 11/21/2022   Achilles tendon contracture, right 11/21/2016   Class 1 obesity due to excess calories with body mass index (BMI) of 33.0 to 33.9 in adult    Diabetic polyneuropathy associated with type 2 diabetes mellitus (HCC)     PCP: Bulah Alm RAMAN, PA-C  REFERRING PROVIDER: Maurilio CHRISTELLA Collet, PA-C  ONSET DATE:   12/26/2023 prosthesis delivery  REFERRING DIAG: S10.487 (ICD-10-CM) - S/P BKA (below knee amputation), left   THERAPY DIAG:  No diagnosis found.  Rationale for Evaluation and Treatment: Rehabilitation  SUBJECTIVE:   SUBJECTIVE STATEMENT: *** Eye doctor for retinopathy gave him shots in left eye.  His right eye following cataract surgery is improved from no vision to some. He is off restrictions for no weights but will back as he is having cataract surgery on left eye on Monday.    PERTINENT HISTORY: left TTA 07/23/23, blind in right eye, DM2, polyneuropathy, Acute renal failure, HLD  PAIN:  Are you having pain? No ***  PRECAUTIONS: Fall  RED FLAGS: None   WEIGHT BEARING RESTRICTIONS: No  FALLS: Has patient fallen in last 6 months? No  LIVING ENVIRONMENT: Lives with: lives with spouse and mother with Alzheimer's. He is currently living with son who had recent medical issue making him bed / wheelchair bound.  His wife & he are son's caregivers but think is not permanent situation.   Lives in: House his house is 2 story development worker, community / bedroom main level) and son's house is single level (where they are currently staying).  Home Access: 3 steps to main entrance to his home and son's house has ramp.  Stairs: Yes: Internal: 14 steps; on right going up and External: 3 steps; on left going up Has following equipment at home: Single point cane, Walker - 2 wheeled, Wheelchair (manual), Shower bench, and bed side commode  PLOF: Independent, Independent with household mobility without device, and  Independent with community mobility without device  Occupation: retired  PATIENT GOALS: to use prosthesis normal activities: driving, shopping, yard work,  active at place in [employer],   OBJECTIVE:  Patient-Specific Activity Scoring Scheme  0 represents unable to perform. 10 represents able to perform at prior level. 0 1 2 3 4 5 6 7 8 9  10 (Date and Score)   Activity  Eval 12/29/23 02/18/24   1. Care & wear of prosthesis  1 7   2. Standing ADLs   2 5   3. Walking  2 5  4. Outdoor activities / curb / ramps/ stairs / uneven 0 2  5.    Score 1.25 4.75   Total score = sum of the activity scores/number of activities Minimum detectable change (90%CI) for average score = 2 points Minimum detectable change (90%CI) for single activity score = 3 points  COGNITION: Evaluation on 12/29/2023: Overall cognitive status: Within functional limits for tasks assessed   SENSATION: Evaluation on 12/29/2023: [facility]  CARDIOVASCULAR RESPONSE: Evaluation on 12/29/2023: Functional activity: standing balance & gait Post-activity vitals: HR: 116 SpO2: 100% Modified Borg scale for dyspnea: 2: mild shortness of breath  POSTURE:  Evaluation on 12/29/2023: rounded shoulders, forward head, flexed trunk , and weight shift right  LOWER EXTREMITY ROM:  ROM P:passive  A:active Left Eval 12/29/23  Hip flexion   Hip extension   Hip abduction   Hip adduction   Hip internal rotation   Hip external rotation   Knee flexion   Knee extension 0*  Ankle dorsiflexion   Ankle plantarflexion   Ankle inversion   Ankle eversion    (Blank rows = not tested)  LOWER EXTREMITY MMT:  MMT Right Eval 12/29/23 Left Eval 12/2723  Hip flexion    Hip extension 3/5   Hip abduction 3/5   Hip adduction    Hip internal rotation    Hip external rotation    Knee flexion 3/5   Knee extension 3/5 4/5  Ankle dorsiflexion  2-/5  Ankle plantarflexion    Ankle inversion    Ankle eversion    At Evaluation all strength testing is grossly seated and functionally standing / gait. (Blank rows = not tested)  TRANSFERS: Evaluation on 12/29/2023: Sit to stand: SBA from 22 w/c requires use of armrests and back of legs against chair &/or RW for stabilization. Stand to sit: SBA RW to 22 w/c requires use of armrests and back of legs against chair &/or RW for stabilization.  FUNCTIONAL  TESTs:  01/28/2024:  with locked rollator walker support & PT supervision: pt able to reach 7 anteriorly, pick up item from floor and look over his shoulders.  Evaluation on 12/29/2023: Lars Balance 12/56  [facility] PT Assessment - 12/29/23 0900       Standardized Balance Assessment   Standardized Balance Assessment Berg Balance Test      Berg Balance Test   Sit to Stand Needs minimal aid to stand or to stabilize    Standing Unsupported Needs several tries to stand 30 seconds unsupported    Sitting with Back Unsupported but Feet Supported on Floor or Stool Able to sit safely and securely 2 minutes    Stand to Sit Controls descent by using hands    Transfers Able to transfer safely, definite need of hands    Standing Unsupported with Eyes Closed Needs help to keep from falling    Standing Unsupported with Feet Together Needs help to attain position and unable to  hold for 15 seconds    From Standing, Reach Forward with Outstretched Arm Loses balance while trying/requires external support    From Standing Position, Pick up Object from Floor Unable to try/needs assist to keep balance    From Standing Position, Turn to Look Behind Over each Shoulder Needs assist to keep from losing balance and falling    Turn 360 Degrees Needs assistance while turning    Standing Unsupported, Alternately Place Feet on Step/Stool Needs assistance to keep from falling or unable to try    Standing Unsupported, One Foot in [employer] balance while stepping or standing    Standing on One Leg Unable to try or needs assist to prevent fall    Total Score 12    Berg comment: < 36 high risk for falls (close to 100%) 46-51 moderate (>50%)   37-45 significant (>80%) 52-55 lower (> 25%)          GAIT: 01/28/2024: Pt amb 150' with rollator walker with supervision.  PT neg ramp & curb with rollator walker with min/CGA.  Evaluation on 12/29/2023: Distance walked: 60' Assistive device utilized: [employer] - 2 wheeled and  TTA prosthesis Level of assistance: Min A (multiple balance losses requiring PT assist to stabilize especially in turns) Gait pattern: step through pattern, decreased step length- Right, decreased stance time- Left, decreased hip/knee flexion- Left, decreased ankle dorsiflexion- Right, Right hip hike, Left hip hike, genu recurvatum- Right, antalgic, trendelenburg, lateral hip instability, trunk flexed, and poor foot clearance- Right, left knee instability / flexion moment intermittently  Gait velocity:  0.97 ft/sec Comments: excess UE weight bearing on RW.    CURRENT PROSTHETIC WEAR ASSESSMENT: 02/23/2024: Pt tolerates wearing prosthesis and AFO most of awake hours (up to 16 hours) without skin or limb pain issues.   Evaluation on 12/29/2023:  Patient is dependent with: skin check, residual limb care, care of non-amputated limb, prosthetic cleaning, ply sock cleaning, correct ply sock adjustment, proper wear schedule/adjustment, and proper weight-bearing schedule/adjustment Donning prosthesis: SBA Doffing prosthesis: SBA Prosthetic wear tolerance: up to 1.5 hours, 2x/day, 2 of 3 days since delivery Prosthetic weight bearing tolerance: 5 minutes with no c/o residual limb pain Edema: pitting Residual limb condition: cylindrical shape, 3 cm superficial scab at anterior-lateral limb with no signs of infection, small wound on lateral limb proximal to fibular head with white covering 0.4 cm, invaginated scar lateral aspect, dry flaking skin, no hair growth, redness over patella (friction from liner when seated), normal temperature.  Prosthetic description: silicon (9 mm anterior portion) with pin lock suspension, total contact socket with flexible inner socket.    TODAY'S TREATMENT: *** 03/24/24  TREATMENT:                                                                                                                             DATE:  03/18/2024: Prosthetic Care with left transtiial prosthesis and  right blue rocker AFO  Patient ambulated 80' x 2  and 110' x 1 with cane stand-alone tip with minA.  PT cueing on upright posture, sequencing and balance reactions.  Patient had improved balance with gait on the second walk which was post balance work noted below. Pt neg ramp with cane stand alone tip modA for balance.  Pt neg curb with cane stand alone tip with modA for balance / stability 3 reps.   TherAct: Leg press BLEs 131# 15 reps; marching for stance LE control 125# 15 reps;  RLE 68# 6 reps (max reps at higher weight) then 62# 10 reps; LLE 62# 6 reps then 56# 2 reps, 50# 10 reps Standing green Theraband kicks with BUE support of cane & chair back 10 reps with abd, ext, add & straight leg flex with ea LE with PT min/CGA then 2 reps with cane support & minA for all 4 motions.     Neuromuscular Re-education:.  Pt amb against resistance (green theraband LUE) with cane RUE - walk out, hold against resistance 5 sec, then walk back under control.  5 reps with resistance anteriorly and 5 reps resistance posteriorly. ModA for balance / stabilization.    TREATMENT:                                                                                                                             DATE:  03/15/2024: Prosthetic Care with left transtiial prosthesis and right blue rocker AFO  Patient ambulated 80' x 2 and 110' x 1 with cane stand-alone tip with minA.  PT cueing on upright posture, sequencing and balance reactions.  Patient had improved balance with gait on the second walk which was post balance work noted below. Pt neg ramp with cane stand alone tip modA for balance. 2 reps with HHA and 3rd rep with intermittent touch. Pt neg curb with cane stand alone tip with HHA modA for balance / stability 3 reps.  PT cues on technique.   Therapeutic activities Patient performed sit to stand from 24 barstool with out upper extremity assist including goal not to touch parallel bars for stabilization 10 reps 2  sets with minA for stabilization and tactile/verbal correctional cues.  Neuromuscular Re-education:.  Standing balance activities with minA for stabilization and tactile/verbal correctional cues.  PT encouraged use of mirror for visual feedback to stabilize between motions each direction.  Patient performed head motions right/midline/left, up/midline/down and diagonals.  Patient performed each direction standing on floor with eyes open followed by eyes closed.  Patient performed each direction standing on foam with eyes open which caused greater balance disturbances.  Patient was able to improve stabilization with repetition and PT cueing. Standing back movements working on balance: back extensors placing hands on 18 chair seat to upright, side bend placing hands on 18 chair seat to upright both to right & to left and active back extension.    TREATMENT:  DATE:  03/10/2024: Prosthetic Care with left transtiial prosthesis and right blue rocker AFO  Patient ambulated 80' x 2 and 110' x 1 with cane stand-alone tip with minA.  PT cueing on upright posture, sequencing and balance reactions.  Patient had improved balance with gait on the second walk which was post balance work noted below.  Therapeutic activities PT demo and verbal cues on sit to/from stand technique with focus on weight shift over feet as he lowers and raises body weight.  Patient performed sit to stand from 24 barstool with out upper extremity assist including goal not to touch parallel bars for stabilization 10 reps 2 sets with minA for stabilization and tactile/verbal correctional cues.  Neuromuscular Re-education:.  Standing balance activities with minA for stabilization and tactile/verbal correctional cues.  PT encouraged use of mirror for visual feedback to stabilize between motions each direction.  Patient  performed head motions right/midline/left, up/midline/down and diagonals.  Patient performed each direction standing on floor with eyes open followed by eyes closed.  Patient performed each direction standing on foam with eyes open which caused greater balance disturbances.  Patient was able to improve stabilization with repetition and PT cueing.   TREATMENT:                                                                                                                             DATE:  02/25/2024: Prosthetic Care with left transtiial prosthesis and right blue rocker AFO  Patient ambulated 80' x 2 and 110' x 1 with cane stand-alone tip and handhold assist /minA.  PT cueing on upright posture, sequencing and balance reactions. Pt neg ramp & curb with cane & modA/HHA 2 reps ea with PT demo & verbal cues on technique.   TherAct: Leg press BLEs 131# 15 reps 2 sets; marching for stance LE control 125# 15 reps 2 sets;  RLE 68# 6 reps (max reps at higher weight) then 62# 10 reps; LLE 56# 10 reps 2 sets.  Standing green Theraband kicks with BUE support of cane & chair back 10 reps with abd, ext, add & straight leg flex with ea LE with PT min/CGA.    Neuromuscular Re-education:.  Pt amb against resistance (green theraband LUE) with cane RUE - walk out, hold against resistance 5 sec, then walk back under control.  5 reps with resistance anteriorly and 5 reps resistance posteriorly. ModA for balance / stabilization.     HOME EXERCISE PROGRAM: Do each exercise 1-2  times per day Do each exercise 5-10 repetitions Hold each exercise for 2 seconds to feel your location  AT SINK FIND YOUR MIDLINE POSITION AND PLACE FEET EQUAL DISTANCE FROM THE MIDLINE.  Try to find this position when standing still for activities.   USE TAPE ON FLOOR TO MARK THE MIDLINE POSITION which is even with middle of sink.  You also should try to feel with your limb pressure in socket.  You are trying to feel  with limb what you  used to feel with the bottom of your foot.  Side to Side Shift: Moving your hips only (not shoulders): move weight onto your left leg, HOLD/FEEL pressure in socket.  Move back to equal weight on each leg, HOLD/FEEL pressure in socket. Move weight onto your right leg, HOLD/FEEL pressure in socket. Move back to equal weight on each leg, HOLD/FEEL pressure in socket. Repeat.  Start with both hands on sink, progress to hand on prosthetic side only Front to Back Shift: Moving your hips only (not shoulders): move your weight forward onto your toes, HOLD/FEEL pressure in socket. Move your weight back to equal Flat Foot on both legs, HOLD/FEEL  pressure in socket. Move your weight back onto your heels, HOLD/FEEL  pressure in socket. Move your weight back to equal on both legs, HOLD/FEEL  pressure in socket. Repeat.  Start with both hands on sink, progress to hand on prosthetic side only Moving Cones / Cups: With equal weight on each leg: Hold on with one hand the first time, then progress to no hand supports. Move cups from one side of sink to the other. Place cups ~2 out of your reach, progress to 10 beyond reach.  Place one hand in middle of sink and reach with other hand. Do both arms.  Overhead/Upward Reaching: alternated reaching up to top cabinets or ceiling if no cabinets present. Keep equal weight on each leg. Start with one hand support on counter while other hand reaches and progress to no hand support with reaching.  ace one hand in middle of sink and reach with other hand. Do both arms.  5.   Looking Over Shoulders: With equal weight on each leg: alternate turning to look over your shoulders with one hand support on counter as needed.  Start with head motions only to look in front of shoulder, then even with shoulder and progress to looking behind you. To look to side, move head /eyes, then shoulder on side looking pulls back, shift more weight to side looking and pull hip back. Place one hand in middle  of sink and let go with other hand so your shoulder can pull back. Switch hands to look other way.   6.  Stepping into lower cabinet:  Move items under cabinet out of your way. Shift your hips/pelvis so weight on prosthesis. Tighten muscles in hip on prosthetic side.  SLOWLY step other leg so front of foot is in cabinet. Then step back to floor. Repeat with other leg.    ASSESSMENT:  CLINICAL IMPRESSION: *** Patient is improving his balance with PT activities.    Patient will continue to benefit from skilled PT.  OBJECTIVE IMPAIRMENTS: Abnormal gait, decreased activity tolerance, decreased balance, decreased endurance, decreased knowledge of condition, decreased knowledge of use of DME, decreased mobility, difficulty walking, decreased strength, increased edema, postural dysfunction, prosthetic dependency , and impaired skin integrity.   ACTIVITY LIMITATIONS: carrying, lifting, bending, sitting, standing, stairs, transfers, reach over head, and locomotion level  PARTICIPATION LIMITATIONS: meal prep, driving, shopping, community activity, and yard work  PERSONAL FACTORS: Fitness, Time since onset of injury/illness/exacerbation, and 3+ comorbidities: see PMH are also affecting patient's functional outcome.   REHAB POTENTIAL: Good  CLINICAL DECISION MAKING: Evolving/moderate complexity  EVALUATION COMPLEXITY: Moderate   GOALS: *** Goals reviewed with patient? Yes  SHORT TERM GOALS: Target date: 01/28/2024  Patient donnes prosthesis modified independent & verbalizes proper cleaning. Baseline: SEE OBJECTIVE DATA Goal status:   MET  01/28/2024 2.  Patient tolerates prosthesis >10 hrs total /day without skin issues or limb pain >5/10 after standing. Baseline: SEE OBJECTIVE DATA Goal status: MET 01/26/2024  3.  Patient able to reach 7, pick up object from floor and look over both shoulders with RW support with supervision. Baseline: SEE OBJECTIVE DATA Goal status: MET   01/28/2024  4. Patient ambulates 75' with RW & prosthesis with supervision. Baseline: SEE OBJECTIVE DATA Goal status: MET   01/28/2024  5. Patient negotiates ramps & curbs with RW & prosthesis with minA. Baseline: SEE OBJECTIVE DATA Goal status:  MET  01/19/2024  Chaney TERM GOALS: Target date: 03/25/2024  Patient demonstrates & verbalized understanding of prosthetic care to enable safe utilization of prosthesis. Baseline: SEE OBJECTIVE DATA Goal status: Ongoing    03/15/2024  Patient tolerates prosthesis wear >90% of awake hours without skin or limb pain issues. Baseline: SEE OBJECTIVE DATA Goal status: Ongoing     03/15/2024  Berg Balance >30/56 to indicate lower fall risk Baseline: SEE OBJECTIVE DATA Goal status: Ongoing    03/15/2024  Patient ambulates >300' with prosthesis & LRAD independently Baseline: SEE OBJECTIVE DATA Goal status: Ongoing   03/15/2024  Patient negotiates ramps, curbs & stairs with single rail with prosthesis & LRAD independently. Baseline: SEE OBJECTIVE DATA Goal status: Ongoing   03/15/2024  Patient reports Patient-Specific Activity Score average to >/= 5 to indicate improvement in functional activities.   Baseline: SEE OBJECTIVE DATA Goal status: Ongoing   03/15/2024  PLAN:  PT FREQUENCY: 2x/week  PT DURATION: 12 weeks  PLANNED INTERVENTIONS: [id number]- PT Re-evaluation, 97750- Physical Performance Testing, 97110-Therapeutic exercises, 97530- Therapeutic activity, [id number]- Neuromuscular re-education, [phone]- Self Care, [id number]- Gait training, [phone]- Prosthetic Initial , [phone]- Orthotic/Prosthetic subsequent, Patient/Family education, Balance training, Stair training, Scar mobilization, Vestibular training, and DME instructions  PLAN FOR NEXT SESSION: check LTGs with re-certification.  ***  Sign***  "

## 2024-03-24 ENCOUNTER — Ambulatory Visit: Admitting: Physical Therapy

## 2024-03-24 DIAGNOSIS — M6281 Muscle weakness (generalized): Secondary | ICD-10-CM | POA: Diagnosis not present

## 2024-03-24 DIAGNOSIS — Z7409 Other reduced mobility: Secondary | ICD-10-CM | POA: Diagnosis not present

## 2024-03-24 DIAGNOSIS — R2689 Other abnormalities of gait and mobility: Secondary | ICD-10-CM | POA: Diagnosis not present

## 2024-03-24 DIAGNOSIS — R296 Repeated falls: Secondary | ICD-10-CM | POA: Diagnosis not present

## 2024-03-24 DIAGNOSIS — M21371 Foot drop, right foot: Secondary | ICD-10-CM

## 2024-03-24 DIAGNOSIS — R2681 Unsteadiness on feet: Secondary | ICD-10-CM

## 2024-03-29 ENCOUNTER — Encounter: Admitting: Physical Therapy

## 2024-03-30 NOTE — Therapy (Incomplete)
 "  OUTPATIENT PHYSICAL THERAPY PROSTHETIC TREATMENT   Patient Name: Scott Navarro. MRN: [id number] DOB:[birth year], 59 y.o., male Today's Date: 03/31/2024  END OF SESSION:  PT End of Session - 03/31/24 1059     Visit Number 22    Number of Visits 45    Date for Recertification  06/17/24    Authorization Type [employer] other    Authorization Time Period $10 COPAY    Authorization - Visit Number 22    Authorization - Number of Visits 90    PT Start Time 1102    PT Stop Time 1146    PT Time Calculation (min) 44 min    Equipment Utilized During Treatment Gait belt    Activity Tolerance Patient tolerated treatment well    Behavior During Therapy WFL for tasks assessed/performed                 Past Medical History:  Diagnosis Date   Type II diabetes mellitus (HCC) dx'd 10/08/2016   Past Surgical History:  Procedure Laterality Date   AMPUTATION Right 10/09/2016   Procedure: INCISION AND DRAINAGE RIGHT GREAT TOE;  Surgeon: Harden Jerona GAILS, MD;  Location: [facility] OR;  Service: Orthopedics;  Laterality: Right;   AMPUTATION Left 07/23/2023   Procedure: AMPUTATION, BELOW KNEE, LEFT;  Surgeon: Harden Jerona GAILS, MD;  Location: [facility] OR;  Service: Orthopedics;  Laterality: Left;  IRRIGATION AND DEBRIDEMENT AND AMPUTATION OF LEFT LEG   IRRIGATION AND DEBRIDEMENT KNEE Left 07/25/2023   Procedure: IRRIGATION AND DEBRIDEMENT LEFT KNEE;  Surgeon: Harden Jerona GAILS, MD;  Location: [facility] OR;  Service: Orthopedics;  Laterality: Left;  DEBRIDEMENT LEFT LEG   Patient Active Problem List   Diagnosis Date Noted   Coping style affecting medical condition 08/05/2023   S/P BKA (below knee amputation), left (HCC) 07/30/2023   ARF (acute renal failure) 07/23/2023   Hyperlipidemia 07/23/2023   Necrotizing fasciitis (HCC) 07/22/2023   Anemia 07/22/2023   Diabetic retinopathy associated with type 2 diabetes mellitus (HCC) 02/18/2023   Charcot joint of left foot 02/18/2023   Encounter for lipid screening for  cardiovascular disease 02/18/2023   Screening for prostate cancer 02/18/2023   Type 2 diabetes mellitus with hyperglycemia (HCC) 11/28/2022   Gait instability 11/28/2022   Retinal hemorrhage 11/21/2022   Glaucoma 11/21/2022   Achilles tendon contracture, right 11/21/2016   Class 1 obesity due to excess calories with body mass index (BMI) of 33.0 to 33.9 in adult    Diabetic polyneuropathy associated with type 2 diabetes mellitus (HCC)     PCP: Bulah Alm RAMAN, PA-C  REFERRING PROVIDER: Maurilio CHRISTELLA Collet, PA-C  ONSET DATE:  12/26/2023 prosthesis delivery  REFERRING DIAG: S10.487 (ICD-10-CM) - S/P BKA (below knee amputation), left   THERAPY DIAG:  Foot drop, right  Muscle weakness (generalized)  Repeated falls  Impaired functional mobility, balance, gait, and endurance  Unsteadiness on feet  Other abnormalities of gait and mobility  Rationale for Evaluation and Treatment: Rehabilitation  SUBJECTIVE:   SUBJECTIVE STATEMENT: Went to PCP yesterday (03/31/24) for final release after cataract surgery. Patient states that it went well and vision in both eyes is good, but using readers for close-up reading. Patient is reaching out to cataract doctor to confirm update about resistance precautions for therapy.   PERTINENT HISTORY: left TTA 07/23/23, blind in right eye, DM2, polyneuropathy, Acute renal failure, HLD  PAIN:  Are you having pain? Patient reports 0/10 pain since last session.  PRECAUTIONS: Fall  RED FLAGS: None  WEIGHT BEARING RESTRICTIONS: No  FALLS: Has patient fallen in last 6 months? No  LIVING ENVIRONMENT: Lives with: lives with spouse and mother with Alzheimer's. He is currently living with son who had recent medical issue making him bed / wheelchair bound.  His wife & he are son's caregivers but think is not permanent situation.   Lives in: House his house is 2 story development worker, community / bedroom main level) and son's house is single level (where they are  currently staying).  Home Access: 3 steps to main entrance to his home and son's house has ramp.  Stairs: Yes: Internal: 14 steps; on right going up and External: 3 steps; on left going up Has following equipment at home: Single point cane, Walker - 2 wheeled, Wheelchair (manual), Shower bench, and bed side commode  PLOF: Independent, Independent with household mobility without device, and Independent with community mobility without device  Occupation: retired  PATIENT GOALS: to use prosthesis normal activities: driving, shopping, yard work,  active at place in [place],   OBJECTIVE:  Patient-Specific Activity Scoring Scheme  0 represents unable to perform. 10 represents able to perform at prior level. 0 1 2 3 4 5 6 7 8 9  10 (Date and Score)   Activity Eval 12/29/23 02/18/24  03/24/24  1. Care & wear of prosthesis  1 7  8   2. Standing ADLs   2 5  6   3. Walking  2 5 6   4. Outdoor activities / curb / ramps/ stairs / uneven 0 2 4  5.     Score 1.25 4.75 6   Total score = sum of the activity scores/number of activities Minimum detectable change (90%CI) for average score = 2 points Minimum detectable change (90%CI) for single activity score = 3 points  COGNITION: Evaluation on 12/29/2023: Overall cognitive status: Within functional limits for tasks assessed   SENSATION: Evaluation on 12/29/2023: [facility]  CARDIOVASCULAR RESPONSE: Evaluation on 12/29/2023: Functional activity: standing balance & gait Post-activity vitals: HR: 116 SpO2: 100% Modified Borg scale for dyspnea: 2: mild shortness of breath  POSTURE:  Evaluation on 12/29/2023: rounded shoulders, forward head, flexed trunk , and weight shift right  LOWER EXTREMITY ROM:  ROM P:passive  A:active Left Eval 12/29/23  Hip flexion   Hip extension   Hip abduction   Hip adduction   Hip internal rotation   Hip external rotation   Knee flexion   Knee extension 0*  Ankle dorsiflexion   Ankle  plantarflexion   Ankle inversion   Ankle eversion    (Blank rows = not tested)  LOWER EXTREMITY MMT:  MMT Right Eval 12/29/23 Left Eval 12/2723  Hip flexion    Hip extension 3/5   Hip abduction 3/5   Hip adduction    Hip internal rotation    Hip external rotation    Knee flexion 3/5   Knee extension 3/5 4/5  Ankle dorsiflexion  2-/5  Ankle plantarflexion    Ankle inversion    Ankle eversion    At Evaluation all strength testing is grossly seated and functionally standing / gait. (Blank rows = not tested)  TRANSFERS: 03/24/24 Sit to Stand: Supervision from 18 chair with armrests and rollator walker in front for stabilization & safety. Working on ability to stand without UE support, but uses intermittent rollator walker UE stabilization and back of legs on chair.   Evaluation on 12/29/2023: Sit to stand: SBA from 22 w/c requires use of armrests and back of legs against  chair &/or RW for stabilization. Stand to sit: SBA RW to 22 w/c requires use of armrests and back of legs against chair &/or RW for stabilization.  FUNCTIONAL TESTs:  03/24/24:  Lars Balance 31/56  TUG with SPC stand alone tip 26.62 sec MinA/CGA  [facility] PT Assessment - 03/24/24 1100       Standardized Balance Assessment   Standardized Balance Assessment Berg Balance Test      Berg Balance Test   Sit to Stand Able to stand  independently using hands    Standing Unsupported Able to stand 2 minutes with supervision    Sitting with Back Unsupported but Feet Supported on Floor or Stool Able to sit safely and securely 2 minutes    Stand to Sit Controls descent by using hands    Transfers Able to transfer safely, minor use of hands    Standing Unsupported with Eyes Closed Able to stand 10 seconds with supervision    Standing Unsupported with Feet Together Able to place feet together independently and stand for 1 minute with supervision    From Standing, Reach Forward with Outstretched Arm Can reach forward >5  cm safely (2)    From Standing Position, Pick up Object from Floor Able to pick up shoe, needs supervision    From Standing Position, Turn to Look Behind Over each Shoulder Needs supervision when turning    Turn 360 Degrees Needs assistance while turning    Standing Unsupported, Alternately Place Feet on Step/Stool Needs assistance to keep from falling or unable to try    Standing Unsupported, One Foot in Front Able to take small step independently and hold 30 seconds    Standing on One Leg Unable to try or needs assist to prevent fall    Total Score 31    Berg comment: < 36 high risk for falls (close to 100%) 46-51 moderate (>50%)   37-45 significant (>80%) 52-55 lower (> 25%)           01/28/2024:  with locked rollator walker support & PT supervision: pt able to reach 7 anteriorly, pick up item from floor and look over his shoulders.  Evaluation on 12/29/2023: Lars Balance 12/56  [facility] PT Assessment - 12/29/23 0900       Standardized Balance Assessment   Standardized Balance Assessment Berg Balance Test      Berg Balance Test   Sit to Stand Needs minimal aid to stand or to stabilize    Standing Unsupported Needs several tries to stand 30 seconds unsupported    Sitting with Back Unsupported but Feet Supported on Floor or Stool Able to sit safely and securely 2 minutes    Stand to Sit Controls descent by using hands    Transfers Able to transfer safely, definite need of hands    Standing Unsupported with Eyes Closed Needs help to keep from falling    Standing Unsupported with Feet Together Needs help to attain position and unable to hold for 15 seconds    From Standing, Reach Forward with Outstretched Arm Loses balance while trying/requires external support    From Standing Position, Pick up Object from Floor Unable to try/needs assist to keep balance    From Standing Position, Turn to Look Behind Over each Shoulder Needs assist to keep from losing balance and falling    Turn 360  Degrees Needs assistance while turning    Standing Unsupported, Alternately Place Feet on Step/Stool Needs assistance to keep from falling or unable to try  Standing Unsupported, One Foot in [employer] balance while stepping or standing    Standing on One Leg Unable to try or needs assist to prevent fall    Total Score 12    Berg comment: < 36 high risk for falls (close to 100%) 46-51 moderate (>50%)   37-45 significant (>80%) 52-55 lower (> 25%)          GAIT: 03/31/24: Pt amb with SPC-stand alone tip with min-modA for balance deficits.  Pt negotiated ramp with min-modA with SPC-stand alone tip  Pt negotiated curb with min-modA with SPC-stand alone tip  03/24/24:  Pt amb 300 ft with rollator walker with supervision.  Pt negotiated ramp with a rollator walker without physical assistance or noted balance losses, but needs cues for rollator safety. Pt negotiated curb minA/CGA using a rollator walker with tactile cueing from PT to lock the brakes during ascent; descending the curb patient had loss of balance from picking up rollator walker (improper technique). Pt ambulates with SPC stand alone tip with min-modA for balance issues  01/28/2024: Pt amb 150' with rollator walker with supervision.  PT neg ramp & curb with rollator walker with min/CGA.  Evaluation on 12/29/2023: Distance walked: 60' Assistive device utilized: [employer] - 2 wheeled and TTA prosthesis Level of assistance: Min A (multiple balance losses requiring PT assist to stabilize especially in turns) Gait pattern: step through pattern, decreased step length- Right, decreased stance time- Left, decreased hip/knee flexion- Left, decreased ankle dorsiflexion- Right, Right hip hike, Left hip hike, genu recurvatum- Right, antalgic, trendelenburg, lateral hip instability, trunk flexed, and poor foot clearance- Right, left knee instability / flexion moment intermittently  Gait velocity:  0.97 ft/sec Comments: excess UE weight  bearing on RW.    CURRENT PROSTHETIC WEAR ASSESSMENT: 03/24/24 Patient reports wearing prothesis and AFO most of his awake hours without pain or skin issues.  Patient is independent with: skin check, residual limb care, care of non-amputated limb, prosthetic cleaning, ply sock cleaning, correct ply sock adjustment, proper wear schedule/adjustment, and proper weight-bearing schedule/adjustment Donning prosthesis: modified independent Doffing prosthesis: modified independent  02/23/2024: Pt tolerates wearing prosthesis and AFO most of awake hours (up to 16 hours) without skin or limb pain issues.   Evaluation on 12/29/2023:  Patient is dependent with: skin check, residual limb care, care of non-amputated limb, prosthetic cleaning, ply sock cleaning, correct ply sock adjustment, proper wear schedule/adjustment, and proper weight-bearing schedule/adjustment Donning prosthesis: SBA Doffing prosthesis: SBA Prosthetic wear tolerance: up to 1.5 hours, 2x/day, 2 of 3 days since delivery Prosthetic weight bearing tolerance: 5 minutes with no c/o residual limb pain Edema: pitting Residual limb condition: cylindrical shape, 3 cm superficial scab at anterior-lateral limb with no signs of infection, small wound on lateral limb proximal to fibular head with white covering 0.4 cm, invaginated scar lateral aspect, dry flaking skin, no hair growth, redness over patella (friction from liner when seated), normal temperature.  Prosthetic description: silicon (9 mm anterior portion) with pin lock suspension, total contact socket with flexible inner socket.    TODAY'S TREATMENT: 03/31/24 Prosthetic Training with left transtibial prosthesis and right blue rocker AFO  Pt amb with SPC-stand alone tip with min-modA for balance deficits. Progressed to ambulation with open water bottle and plate to simulate carrying food/drink while walking; added conversation while walking as a cognitive challenge for patient  concentration Pt negotiated ramp (3x) ; min-modA with SPC-stand alone tip with verbal cueing from PT to take short steps with heel contact and  forward trunk lean during ascent; descending the ramp patient had increased stepping correction due to LOB and large steps; cued to take smaller steps Pt negotiated curb (3x);  min-modA with SPC-stand alone tip; verbal cue from PT during ascent to move prosthetic leg closer to curb; during descent patient demonstrated proper mechanics for optimal knee flexion (toe over edge) and transition into stepping down and walking.  Neuromuscular Re-education (working on balance including facilitating reactions) Static balance in parallel bars progression: eyes open on floor with head turns in all directions; eyes closed on floor with head turns in all directions; eyes open on airex in all directions; 30 sec each progression. Patient required CGA/minA for loss of balance with intermittent touching and grabbing of parallel bars for increased Rt and anterior sway.  4 square stepping using black tape on floor; CGA/minA with use of a SPC-stand alone tip. Patient required increased stepping corrections posteriorly and relied on the cane for stability.  90* turning on flat surface; CGA/minA with use of a SPC-stand alone tip. PT verbal cue and demonstration for patterning and use of cane.    TREATMENT:           03/24/24 Prosthetic Training with left transtibial prosthesis and right blue rocker AFO  See objective data above   Physical Performance  See objective data above for Berg Balance and TUG  TREATMENT:                                                                                                                             DATE:  03/18/2024: Prosthetic Care with left transtiial prosthesis and right blue rocker AFO  Patient ambulated 80' x 2 and 110' x 1 with cane stand-alone tip with minA.  PT cueing on upright posture, sequencing and balance reactions.  Patient had  improved balance with gait on the second walk which was post balance work noted below. Pt neg ramp with cane stand alone tip modA for balance.  Pt neg curb with cane stand alone tip with modA for balance / stability 3 reps.   TherAct: Leg press BLEs 131# 15 reps; marching for stance LE control 125# 15 reps;  RLE 68# 6 reps (max reps at higher weight) then 62# 10 reps; LLE 62# 6 reps then 56# 2 reps, 50# 10 reps Standing green Theraband kicks with BUE support of cane & chair back 10 reps with abd, ext, add & straight leg flex with ea LE with PT min/CGA then 2 reps with cane support & minA for all 4 motions.     Neuromuscular Re-education:.  Pt amb against resistance (green theraband LUE) with cane RUE - walk out, hold against resistance 5 sec, then walk back under control.  5 reps with resistance anteriorly and 5 reps resistance posteriorly. ModA for balance / stabilization.    TREATMENT:  DATE:  03/15/2024: Prosthetic Care with left transtiial prosthesis and right blue rocker AFO  Patient ambulated 80' x 2 and 110' x 1 with cane stand-alone tip with minA.  PT cueing on upright posture, sequencing and balance reactions.  Patient had improved balance with gait on the second walk which was post balance work noted below. Pt neg ramp with cane stand alone tip modA for balance. 2 reps with HHA and 3rd rep with intermittent touch. Pt neg curb with cane stand alone tip with HHA modA for balance / stability 3 reps.  PT cues on technique.   Therapeutic activities Patient performed sit to stand from 24 barstool with out upper extremity assist including goal not to touch parallel bars for stabilization 10 reps 2 sets with minA for stabilization and tactile/verbal correctional cues.  Neuromuscular Re-education:.  Standing balance activities with minA for stabilization and  tactile/verbal correctional cues.  PT encouraged use of mirror for visual feedback to stabilize between motions each direction.  Patient performed head motions right/midline/left, up/midline/down and diagonals.  Patient performed each direction standing on floor with eyes open followed by eyes closed.  Patient performed each direction standing on foam with eyes open which caused greater balance disturbances.  Patient was able to improve stabilization with repetition and PT cueing. Standing back movements working on balance: back extensors placing hands on 18 chair seat to upright, side bend placing hands on 18 chair seat to upright both to right & to left and active back extension.   HOME EXERCISE PROGRAM: Do each exercise 1-2  times per day Do each exercise 5-10 repetitions Hold each exercise for 2 seconds to feel your location  AT SINK FIND YOUR MIDLINE POSITION AND PLACE FEET EQUAL DISTANCE FROM THE MIDLINE.  Try to find this position when standing still for activities.   USE TAPE ON FLOOR TO MARK THE MIDLINE POSITION which is even with middle of sink.  You also should try to feel with your limb pressure in socket.  You are trying to feel with limb what you used to feel with the bottom of your foot.  Side to Side Shift: Moving your hips only (not shoulders): move weight onto your left leg, HOLD/FEEL pressure in socket.  Move back to equal weight on each leg, HOLD/FEEL pressure in socket. Move weight onto your right leg, HOLD/FEEL pressure in socket. Move back to equal weight on each leg, HOLD/FEEL pressure in socket. Repeat.  Start with both hands on sink, progress to hand on prosthetic side only Front to Back Shift: Moving your hips only (not shoulders): move your weight forward onto your toes, HOLD/FEEL pressure in socket. Move your weight back to equal Flat Foot on both legs, HOLD/FEEL  pressure in socket. Move your weight back onto your heels, HOLD/FEEL  pressure in socket. Move your weight  back to equal on both legs, HOLD/FEEL  pressure in socket. Repeat.  Start with both hands on sink, progress to hand on prosthetic side only Moving Cones / Cups: With equal weight on each leg: Hold on with one hand the first time, then progress to no hand supports. Move cups from one side of sink to the other. Place cups ~2 out of your reach, progress to 10 beyond reach.  Place one hand in middle of sink and reach with other hand. Do both arms.  Overhead/Upward Reaching: alternated reaching up to top cabinets or ceiling if no cabinets present. Keep equal weight on each leg. Start with one hand support  on counter while other hand reaches and progress to no hand support with reaching.  ace one hand in middle of sink and reach with other hand. Do both arms.  5.   Looking Over Shoulders: With equal weight on each leg: alternate turning to look over your shoulders with one hand support on counter as needed.  Start with head motions only to look in front of shoulder, then even with shoulder and progress to looking behind you. To look to side, move head /eyes, then shoulder on side looking pulls back, shift more weight to side looking and pull hip back. Place one hand in middle of sink and let go with other hand so your shoulder can pull back. Switch hands to look other way.   6.  Stepping into lower cabinet:  Move items under cabinet out of your way. Shift your hips/pelvis so weight on prosthesis. Tighten muscles in hip on prosthetic side.  SLOWLY step other leg so front of foot is in cabinet. Then step back to floor. Repeat with other leg.    ASSESSMENT:  CLINICAL IMPRESSION:  Patient has increased stepping correction and LOB with turning activities and static balance in parallel bars. Increased difficulty with head turns diagonally and stepping to the right. He was successful with carrying water and a plate with beach ball during ambulation with SPC-stand alone tip, but requires minA for LOB and coordination.  Patient is improving with curbs and ramps, but still needs cueing for patterning with the cane. Patient will continue to benefit from skilled PT services to improve gait and functional activity for household and community dwelling.   OBJECTIVE IMPAIRMENTS: Abnormal gait, decreased activity tolerance, decreased balance, decreased endurance, decreased knowledge of condition, decreased knowledge of use of DME, decreased mobility, difficulty walking, decreased strength, increased edema, postural dysfunction, prosthetic dependency , and impaired skin integrity.   ACTIVITY LIMITATIONS: carrying, lifting, bending, sitting, standing, stairs, transfers, reach over head, and locomotion level  PARTICIPATION LIMITATIONS: meal prep, driving, shopping, community activity, and yard work  PERSONAL FACTORS: Fitness, Time since onset of injury/illness/exacerbation, and 3+ comorbidities: see PMH are also affecting patient's functional outcome.   REHAB POTENTIAL: Good  CLINICAL DECISION MAKING: Evolving/moderate complexity  EVALUATION COMPLEXITY: Moderate   GOALS:  Goals reviewed with patient? Yes  SHORT TERM GOALS: Target date: 04/22/24  Patient is able to scan the environment in stationary stance without UE support without a balance loss for 30 seconds.  Baseline: SEE OBJECTIVE DATA Goal status:  Ongoing 03/31/24  2. Patient ambulates 100' with SPC stand alone tip & prosthesis with CGA.  Baseline: SEE OBJECTIVE DATA Goal status:  Ongoing 03/31/24  3. TUG with SPC stand alone tip & prosthesis & AFO </= 25 seconds with supervision to indicate lower fall risk Baseline: SEE OBJECTIVE DATA Goal status:  Ongoing 03/31/24   UPDATED Kant TERM GOALS: Target date: 05/21/2024  Patient continues to report prosthesis wear >90% of awake hours without skin or limb pain issues and proper prosthetic care.  Baseline: SEE OBJECTIVE DATA Goal status: Ongoing 03/31/24  Berg Balance >36/56 to indicate lower fall  risk Baseline: SEE OBJECTIVE DATA Goal status: Ongoing 03/31/24  Patient ambulates 100' using a SPC with a stand alone tip independently to enable household mobility with a free hand to carry items. Baseline: SEE OBJECTIVE DATA Goal status: Ongoing 03/31/24  Patient negotiates ramps, curbs & stairs with single rail using a rollator walker independently and safely. Baseline: SEE OBJECTIVE DATA Goal status: Ongoing 03/31/24  Patient  reports Patient-Specific Activity Score average to >/= 8 to indicate improvement in functional activities.   Baseline: SEE OBJECTIVE DATA Goal status: Ongoing 03/31/24  6. TUG with SPC stand alone tip </= 22 seconds to indicate lower fall risk Baseline: SEE OBJECTIVE DATA Goal status: Ongoing 03/31/24  PLAN:  PT FREQUENCY: 2x/week  PT DURATION: 12 weeks  PLANNED INTERVENTIONS: [id number]- PT Re-evaluation, 97750- Physical Performance Testing, 97110-Therapeutic exercises, 97530- Therapeutic activity, [id number]- Neuromuscular re-education, [phone]- Self Care, [id number] Gait training, [phone]- Prosthetic Initial , [phone]- Orthotic/Prosthetic subsequent, Patient/Family education, Balance training, Stair training, Scar mobilization, Vestibular training, and DME instructions  PLAN FOR NEXT SESSION: Work towards updated STGs. Review safety with curb and ramp negotiation using rollator walker.  Household functional gait tasks including changing directions and negotiating around obstacles.    Sherline Babe, Student-PT 03/31/2024, 12:27 PM  This entire session of physical therapy was performed under the direct supervision of PT signing evaluation /treatment. PT reviewed note and agrees.   Grayce Spatz, PT, DPT 03/31/2024, 12:40 PM   "

## 2024-03-31 ENCOUNTER — Encounter: Payer: Self-pay | Admitting: Physical Therapy

## 2024-03-31 ENCOUNTER — Ambulatory Visit: Admitting: Physical Therapy

## 2024-03-31 DIAGNOSIS — R2681 Unsteadiness on feet: Secondary | ICD-10-CM | POA: Diagnosis not present

## 2024-03-31 DIAGNOSIS — M21371 Foot drop, right foot: Secondary | ICD-10-CM | POA: Diagnosis not present

## 2024-03-31 DIAGNOSIS — R296 Repeated falls: Secondary | ICD-10-CM | POA: Diagnosis not present

## 2024-03-31 DIAGNOSIS — M6281 Muscle weakness (generalized): Secondary | ICD-10-CM | POA: Diagnosis not present

## 2024-03-31 DIAGNOSIS — R2689 Other abnormalities of gait and mobility: Secondary | ICD-10-CM

## 2024-03-31 DIAGNOSIS — Z7409 Other reduced mobility: Secondary | ICD-10-CM | POA: Diagnosis not present

## 2024-04-01 NOTE — Therapy (Incomplete)
 "  OUTPATIENT PHYSICAL THERAPY PROSTHETIC TREATMENT   Patient Name: Scott Navarro. MRN: [id number] DOB:01/31/1966, 59 y.o., male Today's Date: 04/01/2024  END OF SESSION:           Past Medical History:  Diagnosis Date   Type II diabetes mellitus (HCC) dx'd 10/08/2016   Past Surgical History:  Procedure Laterality Date   AMPUTATION Right 10/09/2016   Procedure: INCISION AND DRAINAGE RIGHT GREAT TOE;  Surgeon: Harden Jerona GAILS, MD;  Location: [facility] OR;  Service: Orthopedics;  Laterality: Right;   AMPUTATION Left 07/23/2023   Procedure: AMPUTATION, BELOW KNEE, LEFT;  Surgeon: Harden Jerona GAILS, MD;  Location: [facility] OR;  Service: Orthopedics;  Laterality: Left;  IRRIGATION AND DEBRIDEMENT AND AMPUTATION OF LEFT LEG   IRRIGATION AND DEBRIDEMENT KNEE Left 07/25/2023   Procedure: IRRIGATION AND DEBRIDEMENT LEFT KNEE;  Surgeon: Harden Jerona GAILS, MD;  Location: [facility] OR;  Service: Orthopedics;  Laterality: Left;  DEBRIDEMENT LEFT LEG   Patient Active Problem List   Diagnosis Date Noted   Coping style affecting medical condition 08/05/2023   S/P BKA (below knee amputation), left (HCC) 07/30/2023   ARF (acute renal failure) 07/23/2023   Hyperlipidemia 07/23/2023   Necrotizing fasciitis (HCC) 07/22/2023   Anemia 07/22/2023   Diabetic retinopathy associated with type 2 diabetes mellitus (HCC) 02/18/2023   Charcot joint of left foot 02/18/2023   Encounter for lipid screening for cardiovascular disease 02/18/2023   Screening for prostate cancer 02/18/2023   Type 2 diabetes mellitus with hyperglycemia (HCC) 11/28/2022   Gait instability 11/28/2022   Retinal hemorrhage 11/21/2022   Glaucoma 11/21/2022   Achilles tendon contracture, right 11/21/2016   Class 1 obesity due to excess calories with body mass index (BMI) of 33.0 to 33.9 in adult    Diabetic polyneuropathy associated with type 2 diabetes mellitus (HCC)     PCP: Bulah Alm RAMAN, PA-C  REFERRING PROVIDER: Maurilio CHRISTELLA Collet, PA-C  ONSET  DATE:  12/26/2023 prosthesis delivery  REFERRING DIAG: S10.487 (ICD-10-CM) - S/P BKA (below knee amputation), left   THERAPY DIAG:  No diagnosis found.  Rationale for Evaluation and Treatment: Rehabilitation  SUBJECTIVE:   SUBJECTIVE STATEMENT: ***Went to PCP yesterday (03/31/24) for final release after cataract surgery. Patient states that it went well and vision in both eyes is good, but using readers for close-up reading. Patient is reaching out to cataract doctor to confirm update about resistance precautions for therapy.   PERTINENT HISTORY: left TTA 07/23/23, blind in right eye, DM2, polyneuropathy, Acute renal failure, HLD  PAIN:  Are you having pain? Patient reports 0/10 pain since last session.  PRECAUTIONS: Fall  RED FLAGS: None   WEIGHT BEARING RESTRICTIONS: No  FALLS: Has patient fallen in last 6 months? No  LIVING ENVIRONMENT: Lives with: lives with spouse and mother with Alzheimer's. He is currently living with son who had recent medical issue making him bed / wheelchair bound.  His wife & he are son's caregivers but think is not permanent situation.   Lives in: House his house is 2 story development worker, community / bedroom main level) and son's house is single level (where they are currently staying).  Home Access: 3 steps to main entrance to his home and son's house has ramp.  Stairs: Yes: Internal: 14 steps; on right going up and External: 3 steps; on left going up Has following equipment at home: Single point cane, Walker - 2 wheeled, Wheelchair (manual), Shower bench, and bed side commode  PLOF: Independent, Independent with  household mobility without device, and Independent with community mobility without device  Occupation: retired  PATIENT GOALS: to use prosthesis normal activities: driving, shopping, yard work,  active at place in [place],   OBJECTIVE:  Patient-Specific Activity Scoring Scheme  0 represents unable to perform. 10 represents able to  perform at prior level. 0 1 2 3 4 5 6 7 8 9  10 (Date and Score)   Activity Eval 12/29/23 02/18/24  03/24/24  1. Care & wear of prosthesis  1 7  8   2. Standing ADLs   2 5  6   3. Walking  2 5 6   4. Outdoor activities / curb / ramps/ stairs / uneven 0 2 4  5.     Score 1.25 4.75 6   Total score = sum of the activity scores/number of activities Minimum detectable change (90%CI) for average score = 2 points Minimum detectable change (90%CI) for single activity score = 3 points  COGNITION: Evaluation on 12/29/2023: Overall cognitive status: Within functional limits for tasks assessed   SENSATION: Evaluation on 12/29/2023: [facility]  CARDIOVASCULAR RESPONSE: Evaluation on 12/29/2023: Functional activity: standing balance & gait Post-activity vitals: HR: 116 SpO2: 100% Modified Borg scale for dyspnea: 2: mild shortness of breath  POSTURE:  Evaluation on 12/29/2023: rounded shoulders, forward head, flexed trunk , and weight shift right  LOWER EXTREMITY ROM:  ROM P:passive  A:active Left Eval 12/29/23  Hip flexion   Hip extension   Hip abduction   Hip adduction   Hip internal rotation   Hip external rotation   Knee flexion   Knee extension 0*  Ankle dorsiflexion   Ankle plantarflexion   Ankle inversion   Ankle eversion    (Blank rows = not tested)  LOWER EXTREMITY MMT:  MMT Right Eval 12/29/23 Left Eval 12/2723  Hip flexion    Hip extension 3/5   Hip abduction 3/5   Hip adduction    Hip internal rotation    Hip external rotation    Knee flexion 3/5   Knee extension 3/5 4/5  Ankle dorsiflexion  2-/5  Ankle plantarflexion    Ankle inversion    Ankle eversion    At Evaluation all strength testing is grossly seated and functionally standing / gait. (Blank rows = not tested)  TRANSFERS: 03/24/24 Sit to Stand: Supervision from 18 chair with armrests and rollator walker in front for stabilization & safety. Working on ability to stand without UE support, but  uses intermittent rollator walker UE stabilization and back of legs on chair.   Evaluation on 12/29/2023: Sit to stand: SBA from 22 w/c requires use of armrests and back of legs against chair &/or RW for stabilization. Stand to sit: SBA RW to 22 w/c requires use of armrests and back of legs against chair &/or RW for stabilization.  FUNCTIONAL TESTs:  03/24/24:  Lars Balance 31/56  TUG with SPC stand alone tip 26.62 sec MinA/CGA  [facility] PT Assessment - 03/24/24 1100       Standardized Balance Assessment   Standardized Balance Assessment Berg Balance Test      Berg Balance Test   Sit to Stand Able to stand  independently using hands    Standing Unsupported Able to stand 2 minutes with supervision    Sitting with Back Unsupported but Feet Supported on Floor or Stool Able to sit safely and securely 2 minutes    Stand to Sit Controls descent by using hands    Transfers Able to transfer safely,  minor use of hands    Standing Unsupported with Eyes Closed Able to stand 10 seconds with supervision    Standing Unsupported with Feet Together Able to place feet together independently and stand for 1 minute with supervision    From Standing, Reach Forward with Outstretched Arm Can reach forward >5 cm safely (2)    From Standing Position, Pick up Object from Floor Able to pick up shoe, needs supervision    From Standing Position, Turn to Look Behind Over each Shoulder Needs supervision when turning    Turn 360 Degrees Needs assistance while turning    Standing Unsupported, Alternately Place Feet on Step/Stool Needs assistance to keep from falling or unable to try    Standing Unsupported, One Foot in Front Able to take small step independently and hold 30 seconds    Standing on One Leg Unable to try or needs assist to prevent fall    Total Score 31    Berg comment: < 36 high risk for falls (close to 100%) 46-51 moderate (>50%)   37-45 significant (>80%) 52-55 lower (> 25%)            01/28/2024:  with locked rollator walker support & PT supervision: pt able to reach 7 anteriorly, pick up item from floor and look over his shoulders.  Evaluation on 12/29/2023: Lars Balance 12/56  [facility] PT Assessment - 12/29/23 0900       Standardized Balance Assessment   Standardized Balance Assessment Berg Balance Test      Berg Balance Test   Sit to Stand Needs minimal aid to stand or to stabilize    Standing Unsupported Needs several tries to stand 30 seconds unsupported    Sitting with Back Unsupported but Feet Supported on Floor or Stool Able to sit safely and securely 2 minutes    Stand to Sit Controls descent by using hands    Transfers Able to transfer safely, definite need of hands    Standing Unsupported with Eyes Closed Needs help to keep from falling    Standing Unsupported with Feet Together Needs help to attain position and unable to hold for 15 seconds    From Standing, Reach Forward with Outstretched Arm Loses balance while trying/requires external support    From Standing Position, Pick up Object from Floor Unable to try/needs assist to keep balance    From Standing Position, Turn to Look Behind Over each Shoulder Needs assist to keep from losing balance and falling    Turn 360 Degrees Needs assistance while turning    Standing Unsupported, Alternately Place Feet on Step/Stool Needs assistance to keep from falling or unable to try    Standing Unsupported, One Foot in [employer] balance while stepping or standing    Standing on One Leg Unable to try or needs assist to prevent fall    Total Score 12    Berg comment: < 36 high risk for falls (close to 100%) 46-51 moderate (>50%)   37-45 significant (>80%) 52-55 lower (> 25%)          GAIT: 03/31/24: Pt amb with SPC-stand alone tip with min-modA for balance deficits.  Pt negotiated ramp with min-modA with SPC-stand alone tip  Pt negotiated curb with min-modA with SPC-stand alone tip  03/24/24:  Pt amb 300  ft with rollator walker with supervision.  Pt negotiated ramp with a rollator walker without physical assistance or noted balance losses, but needs cues for rollator safety. Pt negotiated curb minA/CGA using  a rollator walker with tactile cueing from PT to lock the brakes during ascent; descending the curb patient had loss of balance from picking up rollator walker (improper technique). Pt ambulates with SPC stand alone tip with min-modA for balance issues  01/28/2024: Pt amb 150' with rollator walker with supervision.  PT neg ramp & curb with rollator walker with min/CGA.  Evaluation on 12/29/2023: Distance walked: 60' Assistive device utilized: [employer] - 2 wheeled and TTA prosthesis Level of assistance: Min A (multiple balance losses requiring PT assist to stabilize especially in turns) Gait pattern: step through pattern, decreased step length- Right, decreased stance time- Left, decreased hip/knee flexion- Left, decreased ankle dorsiflexion- Right, Right hip hike, Left hip hike, genu recurvatum- Right, antalgic, trendelenburg, lateral hip instability, trunk flexed, and poor foot clearance- Right, left knee instability / flexion moment intermittently  Gait velocity:  0.97 ft/sec Comments: excess UE weight bearing on RW.    CURRENT PROSTHETIC WEAR ASSESSMENT: 03/24/24 Patient reports wearing prothesis and AFO most of his awake hours without pain or skin issues.  Patient is independent with: skin check, residual limb care, care of non-amputated limb, prosthetic cleaning, ply sock cleaning, correct ply sock adjustment, proper wear schedule/adjustment, and proper weight-bearing schedule/adjustment Donning prosthesis: modified independent Doffing prosthesis: modified independent  02/23/2024: Pt tolerates wearing prosthesis and AFO most of awake hours (up to 16 hours) without skin or limb pain issues.   Evaluation on 12/29/2023:  Patient is dependent with: skin check, residual limb care, care of  non-amputated limb, prosthetic cleaning, ply sock cleaning, correct ply sock adjustment, proper wear schedule/adjustment, and proper weight-bearing schedule/adjustment Donning prosthesis: SBA Doffing prosthesis: SBA Prosthetic wear tolerance: up to 1.5 hours, 2x/day, 2 of 3 days since delivery Prosthetic weight bearing tolerance: 5 minutes with no c/o residual limb pain Edema: pitting Residual limb condition: cylindrical shape, 3 cm superficial scab at anterior-lateral limb with no signs of infection, small wound on lateral limb proximal to fibular head with white covering 0.4 cm, invaginated scar lateral aspect, dry flaking skin, no hair growth, redness over patella (friction from liner when seated), normal temperature.  Prosthetic description: silicon (9 mm anterior portion) with pin lock suspension, total contact socket with flexible inner socket.   TODAY'S TREATMENT:*** 04/05/24  TREATMENT: 03/31/24 Prosthetic Training with left transtibial prosthesis and right blue rocker AFO  Pt amb with SPC-stand alone tip with min-modA for balance deficits. Progressed to ambulation with open water bottle and plate to simulate carrying food/drink while walking; added conversation while walking as a cognitive challenge for patient concentration Pt negotiated ramp (3x) ; min-modA with SPC-stand alone tip with verbal cueing from PT to take short steps with heel contact and forward trunk lean during ascent; descending the ramp patient had increased stepping correction due to LOB and large steps; cued to take smaller steps Pt negotiated curb (3x);  min-modA with SPC-stand alone tip; verbal cue from PT during ascent to move prosthetic leg closer to curb; during descent patient demonstrated proper mechanics for optimal knee flexion (toe over edge) and transition into stepping down and walking.  Neuromuscular Re-education (working on balance including facilitating reactions) Static balance in parallel bars  progression: eyes open on floor with head turns in all directions; eyes closed on floor with head turns in all directions; eyes open on airex in all directions; 30 sec each progression. Patient required CGA/minA for loss of balance with intermittent touching and grabbing of parallel bars for increased Rt and anterior sway.  4 square stepping  using black tape on floor; CGA/minA with use of a SPC-stand alone tip. Patient required increased stepping corrections posteriorly and relied on the cane for stability.  90* turning on flat surface; CGA/minA with use of a SPC-stand alone tip. PT verbal cue and demonstration for patterning and use of cane.    TREATMENT:           03/24/24 Prosthetic Training with left transtibial prosthesis and right blue rocker AFO  See objective data above   Physical Performance  See objective data above for Berg Balance and TUG  TREATMENT:                                                                                                                             DATE:  03/18/2024: Prosthetic Care with left transtiial prosthesis and right blue rocker AFO  Patient ambulated 80' x 2 and 110' x 1 with cane stand-alone tip with minA.  PT cueing on upright posture, sequencing and balance reactions.  Patient had improved balance with gait on the second walk which was post balance work noted below. Pt neg ramp with cane stand alone tip modA for balance.  Pt neg curb with cane stand alone tip with modA for balance / stability 3 reps.   TherAct: Leg press BLEs 131# 15 reps; marching for stance LE control 125# 15 reps;  RLE 68# 6 reps (max reps at higher weight) then 62# 10 reps; LLE 62# 6 reps then 56# 2 reps, 50# 10 reps Standing green Theraband kicks with BUE support of cane & chair back 10 reps with abd, ext, add & straight leg flex with ea LE with PT min/CGA then 2 reps with cane support & minA for all 4 motions.     Neuromuscular Re-education:.  Pt amb against resistance  (green theraband LUE) with cane RUE - walk out, hold against resistance 5 sec, then walk back under control.  5 reps with resistance anteriorly and 5 reps resistance posteriorly. ModA for balance / stabilization.   HOME EXERCISE PROGRAM: Do each exercise 1-2  times per day Do each exercise 5-10 repetitions Hold each exercise for 2 seconds to feel your location  AT SINK FIND YOUR MIDLINE POSITION AND PLACE FEET EQUAL DISTANCE FROM THE MIDLINE.  Try to find this position when standing still for activities.   USE TAPE ON FLOOR TO MARK THE MIDLINE POSITION which is even with middle of sink.  You also should try to feel with your limb pressure in socket.  You are trying to feel with limb what you used to feel with the bottom of your foot.  Side to Side Shift: Moving your hips only (not shoulders): move weight onto your left leg, HOLD/FEEL pressure in socket.  Move back to equal weight on each leg, HOLD/FEEL pressure in socket. Move weight onto your right leg, HOLD/FEEL pressure in socket. Move back to equal weight on each leg, HOLD/FEEL pressure in socket. Repeat.  Start  with both hands on sink, progress to hand on prosthetic side only Front to Back Shift: Moving your hips only (not shoulders): move your weight forward onto your toes, HOLD/FEEL pressure in socket. Move your weight back to equal Flat Foot on both legs, HOLD/FEEL  pressure in socket. Move your weight back onto your heels, HOLD/FEEL  pressure in socket. Move your weight back to equal on both legs, HOLD/FEEL  pressure in socket. Repeat.  Start with both hands on sink, progress to hand on prosthetic side only Moving Cones / Cups: With equal weight on each leg: Hold on with one hand the first time, then progress to no hand supports. Move cups from one side of sink to the other. Place cups ~2 out of your reach, progress to 10 beyond reach.  Place one hand in middle of sink and reach with other hand. Do both arms.  Overhead/Upward Reaching:  alternated reaching up to top cabinets or ceiling if no cabinets present. Keep equal weight on each leg. Start with one hand support on counter while other hand reaches and progress to no hand support with reaching.  ace one hand in middle of sink and reach with other hand. Do both arms.  5.   Looking Over Shoulders: With equal weight on each leg: alternate turning to look over your shoulders with one hand support on counter as needed.  Start with head motions only to look in front of shoulder, then even with shoulder and progress to looking behind you. To look to side, move head /eyes, then shoulder on side looking pulls back, shift more weight to side looking and pull hip back. Place one hand in middle of sink and let go with other hand so your shoulder can pull back. Switch hands to look other way.   6.  Stepping into lower cabinet:  Move items under cabinet out of your way. Shift your hips/pelvis so weight on prosthesis. Tighten muscles in hip on prosthetic side.  SLOWLY step other leg so front of foot is in cabinet. Then step back to floor. Repeat with other leg.    ASSESSMENT:  CLINICAL IMPRESSION: ***  Patient has increased stepping correction and LOB with turning activities and static balance in parallel bars. Increased difficulty with head turns diagonally and stepping to the right. He was successful with carrying water and a plate with beach ball during ambulation with SPC-stand alone tip, but requires minA for LOB and coordination. Patient is improving with curbs and ramps, but still needs cueing for patterning with the cane. Patient will continue to benefit from skilled PT services to improve gait and functional activity for household and community dwelling.   OBJECTIVE IMPAIRMENTS: Abnormal gait, decreased activity tolerance, decreased balance, decreased endurance, decreased knowledge of condition, decreased knowledge of use of DME, decreased mobility, difficulty walking, decreased strength,  increased edema, postural dysfunction, prosthetic dependency , and impaired skin integrity.   ACTIVITY LIMITATIONS: carrying, lifting, bending, sitting, standing, stairs, transfers, reach over head, and locomotion level  PARTICIPATION LIMITATIONS: meal prep, driving, shopping, community activity, and yard work  PERSONAL FACTORS: Fitness, Time since onset of injury/illness/exacerbation, and 3+ comorbidities: see PMH are also affecting patient's functional outcome.   REHAB POTENTIAL: Good  CLINICAL DECISION MAKING: Evolving/moderate complexity  EVALUATION COMPLEXITY: Moderate   GOALS: *** Goals reviewed with patient? Yes  SHORT TERM GOALS: Target date: 04/22/24  Patient is able to scan the environment in stationary stance without UE support without a balance loss for 30 seconds.  Baseline: SEE OBJECTIVE DATA Goal status:  Ongoing 03/31/24  2. Patient ambulates 100' with SPC stand alone tip & prosthesis with CGA.  Baseline: SEE OBJECTIVE DATA Goal status:  Ongoing 03/31/24  3. TUG with SPC stand alone tip & prosthesis & AFO </= 25 seconds with supervision to indicate lower fall risk Baseline: SEE OBJECTIVE DATA Goal status:  Ongoing 03/31/24   UPDATED [place] TERM GOALS: Target date: 05/21/2024  Patient continues to report prosthesis wear >90% of awake hours without skin or limb pain issues and proper prosthetic care.  Baseline: SEE OBJECTIVE DATA Goal status: Ongoing 03/31/24  Berg Balance >36/56 to indicate lower fall risk Baseline: SEE OBJECTIVE DATA Goal status: Ongoing 03/31/24  Patient ambulates 100' using a SPC with a stand alone tip independently to enable household mobility with a free hand to carry items. Baseline: SEE OBJECTIVE DATA Goal status: Ongoing 03/31/24  Patient negotiates ramps, curbs & stairs with single rail using a rollator walker independently and safely. Baseline: SEE OBJECTIVE DATA Goal status: Ongoing 03/31/24  Patient reports Patient-Specific Activity  Score average to >/= 8 to indicate improvement in functional activities.   Baseline: SEE OBJECTIVE DATA Goal status: Ongoing 03/31/24  6. TUG with SPC stand alone tip </= 22 seconds to indicate lower fall risk Baseline: SEE OBJECTIVE DATA Goal status: Ongoing 03/31/24  PLAN:  PT FREQUENCY: 2x/week  PT DURATION: 12 weeks  PLANNED INTERVENTIONS: 97164- PT Re-evaluation, 97750- Physical Performance Testing, 97110-Therapeutic exercises, 97530- Therapeutic activity, [id number]- Neuromuscular re-education, [phone]- Self Care, [id number]- Gait training, [phone]- Prosthetic Initial , [phone]- Orthotic/Prosthetic subsequent, Patient/Family education, Balance training, Stair training, Scar mobilization, Vestibular training, and DME instructions  PLAN FOR NEXT SESSION: *** Work towards updated STGs. Review safety with curb and ramp negotiation using rollator walker.  Household functional gait tasks including changing directions and negotiating around obstacles.   SIGN***   "

## 2024-04-05 ENCOUNTER — Encounter: Admitting: Physical Therapy

## 2024-04-07 ENCOUNTER — Ambulatory Visit: Admitting: Physical Therapy

## 2024-04-07 ENCOUNTER — Encounter: Payer: Self-pay | Admitting: Physical Therapy

## 2024-04-07 DIAGNOSIS — M21371 Foot drop, right foot: Secondary | ICD-10-CM | POA: Diagnosis not present

## 2024-04-07 DIAGNOSIS — M6281 Muscle weakness (generalized): Secondary | ICD-10-CM

## 2024-04-07 DIAGNOSIS — R2681 Unsteadiness on feet: Secondary | ICD-10-CM

## 2024-04-07 DIAGNOSIS — R2689 Other abnormalities of gait and mobility: Secondary | ICD-10-CM

## 2024-04-07 DIAGNOSIS — R296 Repeated falls: Secondary | ICD-10-CM | POA: Diagnosis not present

## 2024-04-07 DIAGNOSIS — Z7409 Other reduced mobility: Secondary | ICD-10-CM | POA: Diagnosis not present

## 2024-04-08 NOTE — Therapy (Incomplete)
 "  OUTPATIENT PHYSICAL THERAPY PROSTHETIC TREATMENT   Patient Name: Scott Navarro. MRN: [id number] DOB:[birth year], 59 y.o., male Today's Date: 04/08/2024  END OF SESSION:    Past Medical History:  Diagnosis Date   Type II diabetes mellitus (HCC) dx'd 10/08/2016   Past Surgical History:  Procedure Laterality Date   AMPUTATION Right 10/09/2016   Procedure: INCISION AND DRAINAGE RIGHT GREAT TOE;  Surgeon: Harden Jerona GAILS, MD;  Location: [facility] OR;  Service: Orthopedics;  Laterality: Right;   AMPUTATION Left 07/23/2023   Procedure: AMPUTATION, BELOW KNEE, LEFT;  Surgeon: Harden Jerona GAILS, MD;  Location: [facility] OR;  Service: Orthopedics;  Laterality: Left;  IRRIGATION AND DEBRIDEMENT AND AMPUTATION OF LEFT LEG   IRRIGATION AND DEBRIDEMENT KNEE Left 07/25/2023   Procedure: IRRIGATION AND DEBRIDEMENT LEFT KNEE;  Surgeon: Harden Jerona GAILS, MD;  Location: [facility] OR;  Service: Orthopedics;  Laterality: Left;  DEBRIDEMENT LEFT LEG   Patient Active Problem List   Diagnosis Date Noted   Coping style affecting medical condition 08/05/2023   S/P BKA (below knee amputation), left (HCC) 07/30/2023   ARF (acute renal failure) 07/23/2023   Hyperlipidemia 07/23/2023   Necrotizing fasciitis (HCC) 07/22/2023   Anemia 07/22/2023   Diabetic retinopathy associated with type 2 diabetes mellitus (HCC) 02/18/2023   Charcot joint of left foot 02/18/2023   Encounter for lipid screening for cardiovascular disease 02/18/2023   Screening for prostate cancer 02/18/2023   Type 2 diabetes mellitus with hyperglycemia (HCC) 11/28/2022   Gait instability 11/28/2022   Retinal hemorrhage 11/21/2022   Glaucoma 11/21/2022   Achilles tendon contracture, right 11/21/2016   Class 1 obesity due to excess calories with body mass index (BMI) of 33.0 to 33.9 in adult    Diabetic polyneuropathy associated with type 2 diabetes mellitus (HCC)     PCP: Bulah Alm RAMAN, PA-C  REFERRING PROVIDER: Maurilio CHRISTELLA Collet, PA-C  ONSET DATE:   12/26/2023 prosthesis delivery  REFERRING DIAG: S10.487 (ICD-10-CM) - S/P BKA (below knee amputation), left   THERAPY DIAG:  No diagnosis found.  Rationale for Evaluation and Treatment: Rehabilitation  SUBJECTIVE:   SUBJECTIVE STATEMENT: *** Went to PCP and was cleared for resistance use in therapy after recent cataract surgery. He is feeling well and vision is great. Wife made a big pot of soup during snow and he was able to take 3 steps and place it in the refrigerator.   PERTINENT HISTORY: left TTA 07/23/23, blind in right eye, DM2, polyneuropathy, Acute renal failure, HLD  PAIN:  *** Are you having pain? Patient reports 0/10 pain since last session.  PRECAUTIONS: Fall  RED FLAGS: None   WEIGHT BEARING RESTRICTIONS: No  FALLS: Has patient fallen in last 6 months? No  LIVING ENVIRONMENT: Lives with: lives with spouse and mother with Alzheimer's. He is currently living with son who had recent medical issue making him bed / wheelchair bound.  His wife & he are son's caregivers but think is not permanent situation.   Lives in: House his house is 2 story development worker, community / bedroom main level) and son's house is single level (where they are currently staying).  Home Access: 3 steps to main entrance to his home and son's house has ramp.  Stairs: Yes: Internal: 14 steps; on right going up and External: 3 steps; on left going up Has following equipment at home: Single point cane, Walker - 2 wheeled, Wheelchair (manual), Shower bench, and bed side commode  PLOF: Independent, Independent with household mobility without device,  and Independent with community mobility without device  Occupation: retired  PATIENT GOALS: to use prosthesis normal activities: driving, shopping, yard work,  active at place in [place],   OBJECTIVE:  Patient-Specific Activity Scoring Scheme  0 represents unable to perform. 10 represents able to perform at prior level. 0 1 2 3 4 5 6 7 8 9   10 (Date and Score)   Activity Eval 12/29/23 02/18/24  03/24/24  1. Care & wear of prosthesis  1 7  8   2. Standing ADLs   2 5  6   3. Walking  2 5 6   4. Outdoor activities / curb / ramps/ stairs / uneven 0 2 4  5.     Score 1.25 4.75 6   Total score = sum of the activity scores/number of activities Minimum detectable change (90%CI) for average score = 2 points Minimum detectable change (90%CI) for single activity score = 3 points  COGNITION: Evaluation on 12/29/2023: Overall cognitive status: Within functional limits for tasks assessed   SENSATION: Evaluation on 12/29/2023: [facility]  CARDIOVASCULAR RESPONSE: Evaluation on 12/29/2023: Functional activity: standing balance & gait Post-activity vitals: HR: 116 SpO2: 100% Modified Borg scale for dyspnea: 2: mild shortness of breath  POSTURE:  Evaluation on 12/29/2023: rounded shoulders, forward head, flexed trunk , and weight shift right  LOWER EXTREMITY ROM:  ROM P:passive  A:active Left Eval 12/29/23  Hip flexion   Hip extension   Hip abduction   Hip adduction   Hip internal rotation   Hip external rotation   Knee flexion   Knee extension 0*  Ankle dorsiflexion   Ankle plantarflexion   Ankle inversion   Ankle eversion    (Blank rows = not tested)  LOWER EXTREMITY MMT:  MMT Right Eval 12/29/23 Left Eval 12/2723  Hip flexion    Hip extension 3/5   Hip abduction 3/5   Hip adduction    Hip internal rotation    Hip external rotation    Knee flexion 3/5   Knee extension 3/5 4/5  Ankle dorsiflexion  2-/5  Ankle plantarflexion    Ankle inversion    Ankle eversion    At Evaluation all strength testing is grossly seated and functionally standing / gait. (Blank rows = not tested)  TRANSFERS: 03/24/24 Sit to Stand: Supervision from 18 chair with armrests and rollator walker in front for stabilization & safety. Working on ability to stand without UE support, but uses intermittent rollator walker UE  stabilization and back of legs on chair.   Evaluation on 12/29/2023: Sit to stand: SBA from 22 w/c requires use of armrests and back of legs against chair &/or RW for stabilization. Stand to sit: SBA RW to 22 w/c requires use of armrests and back of legs against chair &/or RW for stabilization.  FUNCTIONAL TESTs:  03/24/24:  Lars Balance 31/56  TUG with SPC stand alone tip 26.62 sec MinA/CGA  [facility] PT Assessment - 03/24/24 1100       Standardized Balance Assessment   Standardized Balance Assessment Berg Balance Test      Berg Balance Test   Sit to Stand Able to stand  independently using hands    Standing Unsupported Able to stand 2 minutes with supervision    Sitting with Back Unsupported but Feet Supported on Floor or Stool Able to sit safely and securely 2 minutes    Stand to Sit Controls descent by using hands    Transfers Able to transfer safely, minor use of hands  Standing Unsupported with Eyes Closed Able to stand 10 seconds with supervision    Standing Unsupported with Feet Together Able to place feet together independently and stand for 1 minute with supervision    From Standing, Reach Forward with Outstretched Arm Can reach forward >5 cm safely (2)    From Standing Position, Pick up Object from Floor Able to pick up shoe, needs supervision    From Standing Position, Turn to Look Behind Over each Shoulder Needs supervision when turning    Turn 360 Degrees Needs assistance while turning    Standing Unsupported, Alternately Place Feet on Step/Stool Needs assistance to keep from falling or unable to try    Standing Unsupported, One Foot in Front Able to take small step independently and hold 30 seconds    Standing on One Leg Unable to try or needs assist to prevent fall    Total Score 31    Berg comment: < 36 high risk for falls (close to 100%) 46-51 moderate (>50%)   37-45 significant (>80%) 52-55 lower (> 25%)           01/28/2024:  with locked rollator walker  support & PT supervision: pt able to reach 7 anteriorly, pick up item from floor and look over his shoulders.  Evaluation on 12/29/2023: Lars Balance 12/56  [facility] PT Assessment - 12/29/23 0900       Standardized Balance Assessment   Standardized Balance Assessment Berg Balance Test      Berg Balance Test   Sit to Stand Needs minimal aid to stand or to stabilize    Standing Unsupported Needs several tries to stand 30 seconds unsupported    Sitting with Back Unsupported but Feet Supported on Floor or Stool Able to sit safely and securely 2 minutes    Stand to Sit Controls descent by using hands    Transfers Able to transfer safely, definite need of hands    Standing Unsupported with Eyes Closed Needs help to keep from falling    Standing Unsupported with Feet Together Needs help to attain position and unable to hold for 15 seconds    From Standing, Reach Forward with Outstretched Arm Loses balance while trying/requires external support    From Standing Position, Pick up Object from Floor Unable to try/needs assist to keep balance    From Standing Position, Turn to Look Behind Over each Shoulder Needs assist to keep from losing balance and falling    Turn 360 Degrees Needs assistance while turning    Standing Unsupported, Alternately Place Feet on Step/Stool Needs assistance to keep from falling or unable to try    Standing Unsupported, One Foot in [employer] balance while stepping or standing    Standing on One Leg Unable to try or needs assist to prevent fall    Total Score 12    Berg comment: < 36 high risk for falls (close to 100%) 46-51 moderate (>50%)   37-45 significant (>80%) 52-55 lower (> 25%)          GAIT: 03/31/24: Pt amb with SPC-stand alone tip with min-modA for balance deficits.  Pt negotiated ramp with min-modA with SPC-stand alone tip  Pt negotiated curb with min-modA with SPC-stand alone tip  03/24/24:  Pt amb 300 ft with rollator walker with supervision.  Pt  negotiated ramp with a rollator walker without physical assistance or noted balance losses, but needs cues for rollator safety. Pt negotiated curb minA/CGA using a rollator walker with tactile cueing from  PT to lock the brakes during ascent; descending the curb patient had loss of balance from picking up rollator walker (improper technique). Pt ambulates with SPC stand alone tip with min-modA for balance issues  01/28/2024: Pt amb 150' with rollator walker with supervision.  PT neg ramp & curb with rollator walker with min/CGA.  Evaluation on 12/29/2023: Distance walked: 60' Assistive device utilized: [employer] - 2 wheeled and TTA prosthesis Level of assistance: Min A (multiple balance losses requiring PT assist to stabilize especially in turns) Gait pattern: step through pattern, decreased step length- Right, decreased stance time- Left, decreased hip/knee flexion- Left, decreased ankle dorsiflexion- Right, Right hip hike, Left hip hike, genu recurvatum- Right, antalgic, trendelenburg, lateral hip instability, trunk flexed, and poor foot clearance- Right, left knee instability / flexion moment intermittently  Gait velocity:  0.97 ft/sec Comments: excess UE weight bearing on RW.    CURRENT PROSTHETIC WEAR ASSESSMENT: 03/24/24 Patient reports wearing prothesis and AFO most of his awake hours without pain or skin issues.  Patient is independent with: skin check, residual limb care, care of non-amputated limb, prosthetic cleaning, ply sock cleaning, correct ply sock adjustment, proper wear schedule/adjustment, and proper weight-bearing schedule/adjustment Donning prosthesis: modified independent Doffing prosthesis: modified independent  02/23/2024: Pt tolerates wearing prosthesis and AFO most of awake hours (up to 16 hours) without skin or limb pain issues.   Evaluation on 12/29/2023:  Patient is dependent with: skin check, residual limb care, care of non-amputated limb, prosthetic cleaning, ply  sock cleaning, correct ply sock adjustment, proper wear schedule/adjustment, and proper weight-bearing schedule/adjustment Donning prosthesis: SBA Doffing prosthesis: SBA Prosthetic wear tolerance: up to 1.5 hours, 2x/day, 2 of 3 days since delivery Prosthetic weight bearing tolerance: 5 minutes with no c/o residual limb pain Edema: pitting Residual limb condition: cylindrical shape, 3 cm superficial scab at anterior-lateral limb with no signs of infection, small wound on lateral limb proximal to fibular head with white covering 0.4 cm, invaginated scar lateral aspect, dry flaking skin, no hair growth, redness over patella (friction from liner when seated), normal temperature.  Prosthetic description: silicon (9 mm anterior portion) with pin lock suspension, total contact socket with flexible inner socket.   TODAY'S TREATMENT:*** 04/12/24  TREATMENT: 04/07/2024 Prosthetic Training with left transtibial prosthesis and right blue rocker AFO  Pt ambulated with SPC-stand alone tip for 65', 60', and 30' ft with min-modA for household functional gait tasks. PT noted Rt hip hike and slight pelvic obliquity. Checked for prosthetic height via iliac crest which appears to be equal with level pelvis. Pt appears to have hip strength deficits, but appears to be symmetric. Gait improved with strengthening exercises.   Neuromuscular Re-education: Static standing balancing and visual environment scanning in parallel bars; 3 x 30 sec with UE (Progression: BUE 30 sec > RUE 30 sec > LUE 30 sec); 3 x 30 sec without BUE. PT verbal and tactile cue for weight shift. Pt performed weight shift on Lt leg well, but demonstrated difficulty weight shifting on the Rt leg due to possible limitations from right blue rocker AFO.  TherAct: PT verbal & demo cues on using rollator walker to move items like pot of soup from one area to another.  This is safer than trying to walk & carry items especially weight. Pt verbalized  understanding.   Lateral step up and down to right and to left on 6 inch step in parallel bars BUE support; 1 set 10 reps. Bosu ball step up, over & down in parallel  bars with BUE support; 2 sets 10 reps. PT verbal cue for upright posture and proper sequencing.  Leg press BLEs 131# 15 reps; marching for stance LE control 125# 15 reps;  RLE 68# 9 reps (max reps at higher weight) 62# 8 reps then 50# 5 reps PT tactile and verbal cueing for loading anterior foot for late stance without ER the Rt. foot; LLE 62# 10 reps  Standing green Theraband kicks with BUE support of cane & chair back 10 reps with abd, ext, add & straight leg flex with ea LE with PT min/CGA then 2 reps with cane support & minA for all 4 motions.    TREATMENT: 03/31/24 Prosthetic Training with left transtibial prosthesis and right blue rocker AFO  Pt amb with SPC-stand alone tip with min-modA for balance deficits. Progressed to ambulation with open water bottle and plate to simulate carrying food/drink while walking; added conversation while walking as a cognitive challenge for patient concentration Pt negotiated ramp (3x) ; min-modA with SPC-stand alone tip with verbal cueing from PT to take short steps with heel contact and forward trunk lean during ascent; descending the ramp patient had increased stepping correction due to LOB and large steps; cued to take smaller steps Pt negotiated curb (3x);  min-modA with SPC-stand alone tip; verbal cue from PT during ascent to move prosthetic leg closer to curb; during descent patient demonstrated proper mechanics for optimal knee flexion (toe over edge) and transition into stepping down and walking.  Neuromuscular Re-education (working on balance including facilitating reactions) Static balance in parallel bars progression: eyes open on floor with head turns in all directions; eyes closed on floor with head turns in all directions; eyes open on airex in all directions; 30 sec each progression.  Patient required CGA/minA for loss of balance with intermittent touching and grabbing of parallel bars for increased Rt and anterior sway.  4 square stepping using black tape on floor; CGA/minA with use of a SPC-stand alone tip. Patient required increased stepping corrections posteriorly and relied on the cane for stability.  90* turning on flat surface; CGA/minA with use of a SPC-stand alone tip. PT verbal cue and demonstration for patterning and use of cane.    TREATMENT:           03/24/24 Prosthetic Training with left transtibial prosthesis and right blue rocker AFO  See objective data above   Physical Performance  See objective data above for Berg Balance and TUG  TREATMENT:                                                                                                                             DATE:  03/18/2024: Prosthetic Care with left transtiial prosthesis and right blue rocker AFO  Patient ambulated 80' x 2 and 110' x 1 with cane stand-alone tip with minA.  PT cueing on upright posture, sequencing and balance reactions.  Patient had improved balance with gait on the second walk  which was post balance work noted below. Pt neg ramp with cane stand alone tip modA for balance.  Pt neg curb with cane stand alone tip with modA for balance / stability 3 reps.   TherAct: Leg press BLEs 131# 15 reps; marching for stance LE control 125# 15 reps;  RLE 68# 6 reps (max reps at higher weight) then 62# 10 reps; LLE 62# 6 reps then 56# 2 reps, 50# 10 reps Standing green Theraband kicks with BUE support of cane & chair back 10 reps with abd, ext, add & straight leg flex with ea LE with PT min/CGA then 2 reps with cane support & minA for all 4 motions.     Neuromuscular Re-education:.  Pt amb against resistance (green theraband LUE) with cane RUE - walk out, hold against resistance 5 sec, then walk back under control.  5 reps with resistance anteriorly and 5 reps resistance posteriorly. ModA for  balance / stabilization.   HOME EXERCISE PROGRAM: Do each exercise 1-2  times per day Do each exercise 5-10 repetitions Hold each exercise for 2 seconds to feel your location  AT SINK FIND YOUR MIDLINE POSITION AND PLACE FEET EQUAL DISTANCE FROM THE MIDLINE.  Try to find this position when standing still for activities.   USE TAPE ON FLOOR TO MARK THE MIDLINE POSITION which is even with middle of sink.  You also should try to feel with your limb pressure in socket.  You are trying to feel with limb what you used to feel with the bottom of your foot.  Side to Side Shift: Moving your hips only (not shoulders): move weight onto your left leg, HOLD/FEEL pressure in socket.  Move back to equal weight on each leg, HOLD/FEEL pressure in socket. Move weight onto your right leg, HOLD/FEEL pressure in socket. Move back to equal weight on each leg, HOLD/FEEL pressure in socket. Repeat.  Start with both hands on sink, progress to hand on prosthetic side only Front to Back Shift: Moving your hips only (not shoulders): move your weight forward onto your toes, HOLD/FEEL pressure in socket. Move your weight back to equal Flat Foot on both legs, HOLD/FEEL  pressure in socket. Move your weight back onto your heels, HOLD/FEEL  pressure in socket. Move your weight back to equal on both legs, HOLD/FEEL  pressure in socket. Repeat.  Start with both hands on sink, progress to hand on prosthetic side only Moving Cones / Cups: With equal weight on each leg: Hold on with one hand the first time, then progress to no hand supports. Move cups from one side of sink to the other. Place cups ~2 out of your reach, progress to 10 beyond reach.  Place one hand in middle of sink and reach with other hand. Do both arms.  Overhead/Upward Reaching: alternated reaching up to top cabinets or ceiling if no cabinets present. Keep equal weight on each leg. Start with one hand support on counter while other hand reaches and progress to no hand  support with reaching.  ace one hand in middle of sink and reach with other hand. Do both arms.  5.   Looking Over Shoulders: With equal weight on each leg: alternate turning to look over your shoulders with one hand support on counter as needed.  Start with head motions only to look in front of shoulder, then even with shoulder and progress to looking behind you. To look to side, move head /eyes, then shoulder on side looking pulls back,  shift more weight to side looking and pull hip back. Place one hand in middle of sink and let go with other hand so your shoulder can pull back. Switch hands to look other way.   6.  Stepping into lower cabinet:  Move items under cabinet out of your way. Shift your hips/pelvis so weight on prosthesis. Tighten muscles in hip on prosthetic side.  SLOWLY step other leg so front of foot is in cabinet. Then step back to floor. Repeat with other leg.    ASSESSMENT:  CLINICAL IMPRESSION: ***Patient has improved ambulation with SPC-stand alone tip for short distances, but still requires minA for balance deficits. Able to return to resistance activities today. During leg press could increase reps with higher weight, but fatigues quickly or required weight to be lowered. LOB and increased support from cane and chair during TB exercises Lt leg > Rt leg. Patient will continue to benefit from skilled PT services to improve LE endurance, strength, and functional activity tolerance.   OBJECTIVE IMPAIRMENTS: Abnormal gait, decreased activity tolerance, decreased balance, decreased endurance, decreased knowledge of condition, decreased knowledge of use of DME, decreased mobility, difficulty walking, decreased strength, increased edema, postural dysfunction, prosthetic dependency , and impaired skin integrity.   ACTIVITY LIMITATIONS: carrying, lifting, bending, sitting, standing, stairs, transfers, reach over head, and locomotion level  PARTICIPATION LIMITATIONS: meal prep, driving,  shopping, community activity, and yard work  PERSONAL FACTORS: Fitness, Time since onset of injury/illness/exacerbation, and 3+ comorbidities: see PMH are also affecting patient's functional outcome.   REHAB POTENTIAL: Good  CLINICAL DECISION MAKING: Evolving/moderate complexity  EVALUATION COMPLEXITY: Moderate   GOALS: *** Goals reviewed with patient? Yes  SHORT TERM GOALS: Target date: 04/22/24  Patient is able to scan the environment in stationary stance without UE support without a balance loss for 30 seconds.  Baseline: SEE OBJECTIVE DATA Goal status:  Ongoing 04/07/24  2. Patient ambulates 100' with SPC stand alone tip & prosthesis with CGA.  Baseline: SEE OBJECTIVE DATA Goal status:  Ongoing 04/07/24  3. TUG with SPC stand alone tip & prosthesis & AFO </= 25 seconds with supervision to indicate lower fall risk Baseline: SEE OBJECTIVE DATA Goal status:  Ongoing 04/07/24   UPDATED [place] TERM GOALS: Target date: 05/21/2024  Patient continues to report prosthesis wear >90% of awake hours without skin or limb pain issues and proper prosthetic care.  Baseline: SEE OBJECTIVE DATA Goal status: Ongoing 04/07/24  Lars Balance >36/56 to indicate lower fall risk Baseline: SEE OBJECTIVE DATA Goal status: Ongoing 04/07/24  Patient ambulates 100' using a SPC with a stand alone tip independently to enable household mobility with a free hand to carry items. Baseline: SEE OBJECTIVE DATA Goal status: Ongoing 04/07/24  Patient negotiates ramps, curbs & stairs with single rail using a rollator walker independently and safely. Baseline: SEE OBJECTIVE DATA Goal status: Ongoing 04/07/24  Patient reports Patient-Specific Activity Score average to >/= 8 to indicate improvement in functional activities.   Baseline: SEE OBJECTIVE DATA Goal status: Ongoing 04/07/24  6. TUG with SPC stand alone tip </= 22 seconds to indicate lower fall risk Baseline: SEE OBJECTIVE DATA Goal status: Ongoing  04/07/24  PLAN:  PT FREQUENCY: 2x/week  PT DURATION: 12 weeks  PLANNED INTERVENTIONS: 97164- PT Re-evaluation, 97750- Physical Performance Testing, 97110-Therapeutic exercises, 97530- Therapeutic activity, [id number]- Neuromuscular re-education, [phone]- Self Care, [id number]- Gait training, [phone]- Prosthetic Initial , [phone]- Orthotic/Prosthetic subsequent, Patient/Family education, Balance training, Stair training, Scar mobilization, Vestibular training, and DME instructions  PLAN FOR  NEXT SESSION: ***Continue strengthening and resistance exercise. Work towards updated STGs. Household functional gait tasks including changing directions and negotiating around obstacles.    Sign***    "

## 2024-04-12 ENCOUNTER — Encounter: Admitting: Physical Therapy

## 2024-04-14 ENCOUNTER — Encounter: Admitting: Physical Therapy

## 2024-04-19 ENCOUNTER — Encounter: Admitting: Physical Therapy

## 2024-04-21 ENCOUNTER — Encounter: Admitting: Physical Therapy

## 2024-04-26 ENCOUNTER — Encounter: Admitting: Physical Therapy

## 2024-04-28 ENCOUNTER — Encounter: Admitting: Physical Therapy
